# Patient Record
Sex: Male | Born: 1955
Health system: Southern US, Community
[De-identification: ages and names within clinical notes are randomized; demographics above are authoritative.]

## PROBLEM LIST (undated history)

## (undated) DIAGNOSIS — E7800 Pure hypercholesterolemia, unspecified: Secondary | ICD-10-CM

## (undated) DIAGNOSIS — C801 Malignant (primary) neoplasm, unspecified: Secondary | ICD-10-CM

## (undated) DIAGNOSIS — I509 Heart failure, unspecified: Secondary | ICD-10-CM

## (undated) DIAGNOSIS — O2230 Deep phlebothrombosis in pregnancy, unspecified trimester: Secondary | ICD-10-CM

## (undated) DIAGNOSIS — I2510 Atherosclerotic heart disease of native coronary artery without angina pectoris: Secondary | ICD-10-CM

## (undated) DIAGNOSIS — J189 Pneumonia, unspecified organism: Secondary | ICD-10-CM

## (undated) DIAGNOSIS — M1990 Unspecified osteoarthritis, unspecified site: Secondary | ICD-10-CM

## (undated) DIAGNOSIS — H269 Unspecified cataract: Secondary | ICD-10-CM

## (undated) DIAGNOSIS — I4891 Unspecified atrial fibrillation: Secondary | ICD-10-CM

## (undated) DIAGNOSIS — I82409 Acute embolism and thrombosis of unspecified deep veins of unspecified lower extremity: Secondary | ICD-10-CM

## (undated) DIAGNOSIS — F29 Unspecified psychosis not due to a substance or known physiological condition: Secondary | ICD-10-CM

## (undated) DIAGNOSIS — I10 Essential (primary) hypertension: Secondary | ICD-10-CM

## (undated) DIAGNOSIS — I2699 Other pulmonary embolism without acute cor pulmonale: Secondary | ICD-10-CM

## (undated) DIAGNOSIS — I499 Cardiac arrhythmia, unspecified: Secondary | ICD-10-CM

## (undated) DIAGNOSIS — G4733 Obstructive sleep apnea (adult) (pediatric): Principal | ICD-10-CM

## (undated) DIAGNOSIS — E669 Obesity, unspecified: Secondary | ICD-10-CM

## (undated) HISTORY — DX: Obstructive sleep apnea (adult) (pediatric): G47.33

## (undated) HISTORY — DX: Heart failure, unspecified: I50.9

## (undated) HISTORY — DX: Atherosclerotic heart disease of native coronary artery without angina pectoris: I25.10

## (undated) HISTORY — DX: Unspecified atrial fibrillation: I48.91

## (undated) HISTORY — PX: OTHER SURGICAL HISTORY: SHX169

## (undated) HISTORY — DX: Acute embolism and thrombosis of unspecified deep veins of unspecified lower extremity: I82.409

## (undated) HISTORY — PX: HERNIA REPAIR: SHX51

## (undated) HISTORY — DX: Other pulmonary embolism without acute cor pulmonale: I26.99

## (undated) HISTORY — DX: Obesity, unspecified: E66.9

## (undated) HISTORY — DX: Essential (primary) hypertension: I10

## (undated) HISTORY — DX: Pure hypercholesterolemia, unspecified: E78.00

## (undated) HISTORY — DX: Deep phlebothrombosis in pregnancy, unspecified trimester: O22.30

---

## 2011-04-27 DIAGNOSIS — M531 Cervicobrachial syndrome: Secondary | ICD-10-CM | POA: Diagnosis not present

## 2011-04-27 DIAGNOSIS — M9981 Other biomechanical lesions of cervical region: Secondary | ICD-10-CM | POA: Diagnosis not present

## 2011-04-28 DIAGNOSIS — D689 Coagulation defect, unspecified: Secondary | ICD-10-CM | POA: Diagnosis not present

## 2011-05-01 DIAGNOSIS — R978 Other abnormal tumor markers: Secondary | ICD-10-CM | POA: Diagnosis not present

## 2011-05-01 DIAGNOSIS — E869 Volume depletion, unspecified: Secondary | ICD-10-CM | POA: Diagnosis not present

## 2011-05-01 DIAGNOSIS — D45 Polycythemia vera: Secondary | ICD-10-CM | POA: Diagnosis not present

## 2011-05-01 DIAGNOSIS — C6290 Malignant neoplasm of unspecified testis, unspecified whether descended or undescended: Secondary | ICD-10-CM | POA: Diagnosis not present

## 2011-05-01 DIAGNOSIS — Z8547 Personal history of malignant neoplasm of testis: Secondary | ICD-10-CM | POA: Diagnosis not present

## 2011-05-02 DIAGNOSIS — R7301 Impaired fasting glucose: Secondary | ICD-10-CM | POA: Diagnosis not present

## 2011-05-02 DIAGNOSIS — M545 Low back pain, unspecified: Secondary | ICD-10-CM | POA: Diagnosis not present

## 2011-05-02 DIAGNOSIS — E782 Mixed hyperlipidemia: Secondary | ICD-10-CM | POA: Diagnosis not present

## 2011-05-02 DIAGNOSIS — E538 Deficiency of other specified B group vitamins: Secondary | ICD-10-CM | POA: Diagnosis not present

## 2011-05-02 DIAGNOSIS — M999 Biomechanical lesion, unspecified: Secondary | ICD-10-CM | POA: Diagnosis not present

## 2011-05-02 DIAGNOSIS — E291 Testicular hypofunction: Secondary | ICD-10-CM | POA: Diagnosis not present

## 2011-05-02 DIAGNOSIS — E785 Hyperlipidemia, unspecified: Secondary | ICD-10-CM | POA: Diagnosis not present

## 2011-05-02 DIAGNOSIS — Z125 Encounter for screening for malignant neoplasm of prostate: Secondary | ICD-10-CM | POA: Diagnosis not present

## 2011-05-02 DIAGNOSIS — I10 Essential (primary) hypertension: Secondary | ICD-10-CM | POA: Diagnosis not present

## 2011-05-02 DIAGNOSIS — E669 Obesity, unspecified: Secondary | ICD-10-CM | POA: Diagnosis not present

## 2011-05-02 DIAGNOSIS — E559 Vitamin D deficiency, unspecified: Secondary | ICD-10-CM | POA: Diagnosis not present

## 2011-05-11 DIAGNOSIS — E291 Testicular hypofunction: Secondary | ICD-10-CM | POA: Diagnosis not present

## 2011-05-11 DIAGNOSIS — R7301 Impaired fasting glucose: Secondary | ICD-10-CM | POA: Diagnosis not present

## 2011-05-11 DIAGNOSIS — E559 Vitamin D deficiency, unspecified: Secondary | ICD-10-CM | POA: Diagnosis not present

## 2011-05-11 DIAGNOSIS — M999 Biomechanical lesion, unspecified: Secondary | ICD-10-CM | POA: Diagnosis not present

## 2011-05-11 DIAGNOSIS — M545 Low back pain, unspecified: Secondary | ICD-10-CM | POA: Diagnosis not present

## 2011-05-11 DIAGNOSIS — E669 Obesity, unspecified: Secondary | ICD-10-CM | POA: Diagnosis not present

## 2011-05-11 DIAGNOSIS — E538 Deficiency of other specified B group vitamins: Secondary | ICD-10-CM | POA: Diagnosis not present

## 2011-05-11 DIAGNOSIS — E785 Hyperlipidemia, unspecified: Secondary | ICD-10-CM | POA: Diagnosis not present

## 2011-05-11 DIAGNOSIS — I10 Essential (primary) hypertension: Secondary | ICD-10-CM | POA: Diagnosis not present

## 2011-05-12 DIAGNOSIS — D689 Coagulation defect, unspecified: Secondary | ICD-10-CM | POA: Diagnosis not present

## 2011-05-15 DIAGNOSIS — I2699 Other pulmonary embolism without acute cor pulmonale: Secondary | ICD-10-CM | POA: Diagnosis not present

## 2011-05-15 DIAGNOSIS — E785 Hyperlipidemia, unspecified: Secondary | ICD-10-CM | POA: Diagnosis not present

## 2011-05-15 DIAGNOSIS — I2510 Atherosclerotic heart disease of native coronary artery without angina pectoris: Secondary | ICD-10-CM | POA: Diagnosis not present

## 2011-05-15 DIAGNOSIS — Z9861 Coronary angioplasty status: Secondary | ICD-10-CM | POA: Diagnosis not present

## 2011-05-21 DIAGNOSIS — M999 Biomechanical lesion, unspecified: Secondary | ICD-10-CM | POA: Diagnosis not present

## 2011-05-21 DIAGNOSIS — M519 Unspecified thoracic, thoracolumbar and lumbosacral intervertebral disc disorder: Secondary | ICD-10-CM | POA: Diagnosis not present

## 2011-05-21 DIAGNOSIS — M545 Low back pain, unspecified: Secondary | ICD-10-CM | POA: Diagnosis not present

## 2011-05-21 DIAGNOSIS — IMO0002 Reserved for concepts with insufficient information to code with codable children: Secondary | ICD-10-CM | POA: Diagnosis not present

## 2011-05-21 DIAGNOSIS — M48061 Spinal stenosis, lumbar region without neurogenic claudication: Secondary | ICD-10-CM | POA: Diagnosis not present

## 2011-05-26 DIAGNOSIS — D689 Coagulation defect, unspecified: Secondary | ICD-10-CM | POA: Diagnosis not present

## 2011-05-28 DIAGNOSIS — M999 Biomechanical lesion, unspecified: Secondary | ICD-10-CM | POA: Diagnosis not present

## 2011-05-28 DIAGNOSIS — M545 Low back pain, unspecified: Secondary | ICD-10-CM | POA: Diagnosis not present

## 2011-05-31 DIAGNOSIS — E669 Obesity, unspecified: Secondary | ICD-10-CM | POA: Diagnosis not present

## 2011-05-31 DIAGNOSIS — E291 Testicular hypofunction: Secondary | ICD-10-CM | POA: Diagnosis not present

## 2011-05-31 DIAGNOSIS — I10 Essential (primary) hypertension: Secondary | ICD-10-CM | POA: Diagnosis not present

## 2011-05-31 DIAGNOSIS — E785 Hyperlipidemia, unspecified: Secondary | ICD-10-CM | POA: Diagnosis not present

## 2011-05-31 DIAGNOSIS — E538 Deficiency of other specified B group vitamins: Secondary | ICD-10-CM | POA: Diagnosis not present

## 2011-05-31 DIAGNOSIS — E559 Vitamin D deficiency, unspecified: Secondary | ICD-10-CM | POA: Diagnosis not present

## 2011-05-31 DIAGNOSIS — R7301 Impaired fasting glucose: Secondary | ICD-10-CM | POA: Diagnosis not present

## 2011-06-08 DIAGNOSIS — D689 Coagulation defect, unspecified: Secondary | ICD-10-CM | POA: Diagnosis not present

## 2011-06-18 DIAGNOSIS — IMO0002 Reserved for concepts with insufficient information to code with codable children: Secondary | ICD-10-CM | POA: Diagnosis not present

## 2011-06-18 DIAGNOSIS — M519 Unspecified thoracic, thoracolumbar and lumbosacral intervertebral disc disorder: Secondary | ICD-10-CM | POA: Diagnosis not present

## 2011-06-18 DIAGNOSIS — M48061 Spinal stenosis, lumbar region without neurogenic claudication: Secondary | ICD-10-CM | POA: Diagnosis not present

## 2011-06-22 DIAGNOSIS — D689 Coagulation defect, unspecified: Secondary | ICD-10-CM | POA: Diagnosis not present

## 2011-07-06 DIAGNOSIS — D689 Coagulation defect, unspecified: Secondary | ICD-10-CM | POA: Diagnosis not present

## 2011-07-09 DIAGNOSIS — M545 Low back pain, unspecified: Secondary | ICD-10-CM | POA: Diagnosis not present

## 2011-07-09 DIAGNOSIS — M999 Biomechanical lesion, unspecified: Secondary | ICD-10-CM | POA: Diagnosis not present

## 2011-07-13 DIAGNOSIS — M999 Biomechanical lesion, unspecified: Secondary | ICD-10-CM | POA: Diagnosis not present

## 2011-07-13 DIAGNOSIS — M545 Low back pain, unspecified: Secondary | ICD-10-CM | POA: Diagnosis not present

## 2011-07-13 DIAGNOSIS — Z5189 Encounter for other specified aftercare: Secondary | ICD-10-CM | POA: Diagnosis not present

## 2011-07-16 DIAGNOSIS — M519 Unspecified thoracic, thoracolumbar and lumbosacral intervertebral disc disorder: Secondary | ICD-10-CM | POA: Diagnosis not present

## 2011-07-16 DIAGNOSIS — IMO0002 Reserved for concepts with insufficient information to code with codable children: Secondary | ICD-10-CM | POA: Diagnosis not present

## 2011-07-16 DIAGNOSIS — M48061 Spinal stenosis, lumbar region without neurogenic claudication: Secondary | ICD-10-CM | POA: Diagnosis not present

## 2011-07-16 DIAGNOSIS — M5380 Other specified dorsopathies, site unspecified: Secondary | ICD-10-CM | POA: Diagnosis not present

## 2011-07-20 DIAGNOSIS — M545 Low back pain, unspecified: Secondary | ICD-10-CM | POA: Diagnosis not present

## 2011-07-20 DIAGNOSIS — M999 Biomechanical lesion, unspecified: Secondary | ICD-10-CM | POA: Diagnosis not present

## 2011-07-20 DIAGNOSIS — Z5189 Encounter for other specified aftercare: Secondary | ICD-10-CM | POA: Diagnosis not present

## 2011-07-21 DIAGNOSIS — D689 Coagulation defect, unspecified: Secondary | ICD-10-CM | POA: Diagnosis not present

## 2011-07-25 DIAGNOSIS — E785 Hyperlipidemia, unspecified: Secondary | ICD-10-CM | POA: Diagnosis not present

## 2011-07-25 DIAGNOSIS — E559 Vitamin D deficiency, unspecified: Secondary | ICD-10-CM | POA: Diagnosis not present

## 2011-07-25 DIAGNOSIS — E782 Mixed hyperlipidemia: Secondary | ICD-10-CM | POA: Diagnosis not present

## 2011-07-25 DIAGNOSIS — I10 Essential (primary) hypertension: Secondary | ICD-10-CM | POA: Diagnosis not present

## 2011-07-25 DIAGNOSIS — E669 Obesity, unspecified: Secondary | ICD-10-CM | POA: Diagnosis not present

## 2011-07-25 DIAGNOSIS — R7301 Impaired fasting glucose: Secondary | ICD-10-CM | POA: Diagnosis not present

## 2011-07-25 DIAGNOSIS — E291 Testicular hypofunction: Secondary | ICD-10-CM | POA: Diagnosis not present

## 2011-07-25 DIAGNOSIS — E538 Deficiency of other specified B group vitamins: Secondary | ICD-10-CM | POA: Diagnosis not present

## 2011-07-27 DIAGNOSIS — M999 Biomechanical lesion, unspecified: Secondary | ICD-10-CM | POA: Diagnosis not present

## 2011-07-27 DIAGNOSIS — Z5189 Encounter for other specified aftercare: Secondary | ICD-10-CM | POA: Diagnosis not present

## 2011-07-27 DIAGNOSIS — M545 Low back pain, unspecified: Secondary | ICD-10-CM | POA: Diagnosis not present

## 2011-07-30 DIAGNOSIS — M545 Low back pain, unspecified: Secondary | ICD-10-CM | POA: Diagnosis not present

## 2011-07-30 DIAGNOSIS — Z5189 Encounter for other specified aftercare: Secondary | ICD-10-CM | POA: Diagnosis not present

## 2011-07-30 DIAGNOSIS — M999 Biomechanical lesion, unspecified: Secondary | ICD-10-CM | POA: Diagnosis not present

## 2011-07-30 DIAGNOSIS — I8010 Phlebitis and thrombophlebitis of unspecified femoral vein: Secondary | ICD-10-CM | POA: Diagnosis not present

## 2011-07-30 DIAGNOSIS — I8310 Varicose veins of unspecified lower extremity with inflammation: Secondary | ICD-10-CM | POA: Diagnosis not present

## 2011-07-30 DIAGNOSIS — I80299 Phlebitis and thrombophlebitis of other deep vessels of unspecified lower extremity: Secondary | ICD-10-CM | POA: Diagnosis not present

## 2011-08-01 DIAGNOSIS — M999 Biomechanical lesion, unspecified: Secondary | ICD-10-CM | POA: Diagnosis not present

## 2011-08-01 DIAGNOSIS — Z5189 Encounter for other specified aftercare: Secondary | ICD-10-CM | POA: Diagnosis not present

## 2011-08-01 DIAGNOSIS — M545 Low back pain, unspecified: Secondary | ICD-10-CM | POA: Diagnosis not present

## 2011-08-03 DIAGNOSIS — M545 Low back pain, unspecified: Secondary | ICD-10-CM | POA: Diagnosis not present

## 2011-08-03 DIAGNOSIS — E785 Hyperlipidemia, unspecified: Secondary | ICD-10-CM | POA: Diagnosis not present

## 2011-08-03 DIAGNOSIS — I10 Essential (primary) hypertension: Secondary | ICD-10-CM | POA: Diagnosis not present

## 2011-08-03 DIAGNOSIS — E291 Testicular hypofunction: Secondary | ICD-10-CM | POA: Diagnosis not present

## 2011-08-03 DIAGNOSIS — E538 Deficiency of other specified B group vitamins: Secondary | ICD-10-CM | POA: Diagnosis not present

## 2011-08-03 DIAGNOSIS — E559 Vitamin D deficiency, unspecified: Secondary | ICD-10-CM | POA: Diagnosis not present

## 2011-08-03 DIAGNOSIS — D689 Coagulation defect, unspecified: Secondary | ICD-10-CM | POA: Diagnosis not present

## 2011-08-03 DIAGNOSIS — R7301 Impaired fasting glucose: Secondary | ICD-10-CM | POA: Diagnosis not present

## 2011-08-03 DIAGNOSIS — Z5189 Encounter for other specified aftercare: Secondary | ICD-10-CM | POA: Diagnosis not present

## 2011-08-03 DIAGNOSIS — E669 Obesity, unspecified: Secondary | ICD-10-CM | POA: Diagnosis not present

## 2011-08-03 DIAGNOSIS — M999 Biomechanical lesion, unspecified: Secondary | ICD-10-CM | POA: Diagnosis not present

## 2011-08-09 DIAGNOSIS — Z7901 Long term (current) use of anticoagulants: Secondary | ICD-10-CM | POA: Diagnosis not present

## 2011-08-09 DIAGNOSIS — I252 Old myocardial infarction: Secondary | ICD-10-CM | POA: Diagnosis not present

## 2011-08-09 DIAGNOSIS — IMO0002 Reserved for concepts with insufficient information to code with codable children: Secondary | ICD-10-CM | POA: Diagnosis not present

## 2011-08-09 DIAGNOSIS — Z86711 Personal history of pulmonary embolism: Secondary | ICD-10-CM | POA: Diagnosis not present

## 2011-08-09 DIAGNOSIS — Z86718 Personal history of other venous thrombosis and embolism: Secondary | ICD-10-CM | POA: Diagnosis not present

## 2011-08-13 DIAGNOSIS — M519 Unspecified thoracic, thoracolumbar and lumbosacral intervertebral disc disorder: Secondary | ICD-10-CM | POA: Diagnosis not present

## 2011-08-13 DIAGNOSIS — M48061 Spinal stenosis, lumbar region without neurogenic claudication: Secondary | ICD-10-CM | POA: Diagnosis not present

## 2011-08-13 DIAGNOSIS — IMO0002 Reserved for concepts with insufficient information to code with codable children: Secondary | ICD-10-CM | POA: Diagnosis not present

## 2011-08-14 DIAGNOSIS — IMO0002 Reserved for concepts with insufficient information to code with codable children: Secondary | ICD-10-CM | POA: Diagnosis not present

## 2011-08-14 DIAGNOSIS — M25569 Pain in unspecified knee: Secondary | ICD-10-CM | POA: Diagnosis not present

## 2011-08-15 DIAGNOSIS — M545 Low back pain, unspecified: Secondary | ICD-10-CM | POA: Diagnosis not present

## 2011-08-15 DIAGNOSIS — M999 Biomechanical lesion, unspecified: Secondary | ICD-10-CM | POA: Diagnosis not present

## 2011-08-15 DIAGNOSIS — Z5189 Encounter for other specified aftercare: Secondary | ICD-10-CM | POA: Diagnosis not present

## 2011-08-17 DIAGNOSIS — D689 Coagulation defect, unspecified: Secondary | ICD-10-CM | POA: Diagnosis not present

## 2011-08-21 DIAGNOSIS — M25569 Pain in unspecified knee: Secondary | ICD-10-CM | POA: Diagnosis not present

## 2011-08-21 DIAGNOSIS — IMO0002 Reserved for concepts with insufficient information to code with codable children: Secondary | ICD-10-CM | POA: Diagnosis not present

## 2011-08-21 DIAGNOSIS — M1710 Unilateral primary osteoarthritis, unspecified knee: Secondary | ICD-10-CM | POA: Diagnosis not present

## 2011-08-30 DIAGNOSIS — D689 Coagulation defect, unspecified: Secondary | ICD-10-CM | POA: Diagnosis not present

## 2011-08-31 DIAGNOSIS — I8310 Varicose veins of unspecified lower extremity with inflammation: Secondary | ICD-10-CM | POA: Diagnosis not present

## 2011-09-03 DIAGNOSIS — M545 Low back pain, unspecified: Secondary | ICD-10-CM | POA: Diagnosis not present

## 2011-09-03 DIAGNOSIS — I8010 Phlebitis and thrombophlebitis of unspecified femoral vein: Secondary | ICD-10-CM | POA: Diagnosis not present

## 2011-09-03 DIAGNOSIS — M999 Biomechanical lesion, unspecified: Secondary | ICD-10-CM | POA: Diagnosis not present

## 2011-09-03 DIAGNOSIS — I872 Venous insufficiency (chronic) (peripheral): Secondary | ICD-10-CM | POA: Diagnosis not present

## 2011-09-03 DIAGNOSIS — Z5189 Encounter for other specified aftercare: Secondary | ICD-10-CM | POA: Diagnosis not present

## 2011-09-03 DIAGNOSIS — I8310 Varicose veins of unspecified lower extremity with inflammation: Secondary | ICD-10-CM | POA: Diagnosis not present

## 2011-09-07 DIAGNOSIS — E869 Volume depletion, unspecified: Secondary | ICD-10-CM | POA: Diagnosis not present

## 2011-09-07 DIAGNOSIS — C6290 Malignant neoplasm of unspecified testis, unspecified whether descended or undescended: Secondary | ICD-10-CM | POA: Diagnosis not present

## 2011-09-10 DIAGNOSIS — IMO0002 Reserved for concepts with insufficient information to code with codable children: Secondary | ICD-10-CM | POA: Diagnosis not present

## 2011-09-10 DIAGNOSIS — M48061 Spinal stenosis, lumbar region without neurogenic claudication: Secondary | ICD-10-CM | POA: Diagnosis not present

## 2011-09-14 DIAGNOSIS — M999 Biomechanical lesion, unspecified: Secondary | ICD-10-CM | POA: Diagnosis not present

## 2011-09-14 DIAGNOSIS — M545 Low back pain, unspecified: Secondary | ICD-10-CM | POA: Diagnosis not present

## 2011-09-14 DIAGNOSIS — Z5189 Encounter for other specified aftercare: Secondary | ICD-10-CM | POA: Diagnosis not present

## 2011-09-19 DIAGNOSIS — Z5189 Encounter for other specified aftercare: Secondary | ICD-10-CM | POA: Diagnosis not present

## 2011-09-19 DIAGNOSIS — M545 Low back pain, unspecified: Secondary | ICD-10-CM | POA: Diagnosis not present

## 2011-09-19 DIAGNOSIS — M999 Biomechanical lesion, unspecified: Secondary | ICD-10-CM | POA: Diagnosis not present

## 2011-09-26 DIAGNOSIS — M999 Biomechanical lesion, unspecified: Secondary | ICD-10-CM | POA: Diagnosis not present

## 2011-09-26 DIAGNOSIS — M545 Low back pain, unspecified: Secondary | ICD-10-CM | POA: Diagnosis not present

## 2011-09-26 DIAGNOSIS — Z5189 Encounter for other specified aftercare: Secondary | ICD-10-CM | POA: Diagnosis not present

## 2011-10-03 DIAGNOSIS — Z5189 Encounter for other specified aftercare: Secondary | ICD-10-CM | POA: Diagnosis not present

## 2011-10-03 DIAGNOSIS — M999 Biomechanical lesion, unspecified: Secondary | ICD-10-CM | POA: Diagnosis not present

## 2011-10-03 DIAGNOSIS — M545 Low back pain, unspecified: Secondary | ICD-10-CM | POA: Diagnosis not present

## 2011-10-04 DIAGNOSIS — M545 Low back pain, unspecified: Secondary | ICD-10-CM | POA: Diagnosis not present

## 2011-10-04 DIAGNOSIS — IMO0002 Reserved for concepts with insufficient information to code with codable children: Secondary | ICD-10-CM | POA: Diagnosis not present

## 2011-10-08 DIAGNOSIS — E7800 Pure hypercholesterolemia, unspecified: Secondary | ICD-10-CM | POA: Diagnosis not present

## 2011-10-08 DIAGNOSIS — Z5181 Encounter for therapeutic drug level monitoring: Secondary | ICD-10-CM | POA: Diagnosis not present

## 2011-10-08 DIAGNOSIS — IMO0002 Reserved for concepts with insufficient information to code with codable children: Secondary | ICD-10-CM | POA: Diagnosis not present

## 2011-10-08 DIAGNOSIS — M48061 Spinal stenosis, lumbar region without neurogenic claudication: Secondary | ICD-10-CM | POA: Diagnosis not present

## 2011-10-08 DIAGNOSIS — I2510 Atherosclerotic heart disease of native coronary artery without angina pectoris: Secondary | ICD-10-CM | POA: Diagnosis not present

## 2011-10-22 DIAGNOSIS — Z5189 Encounter for other specified aftercare: Secondary | ICD-10-CM | POA: Diagnosis not present

## 2011-10-22 DIAGNOSIS — M545 Low back pain, unspecified: Secondary | ICD-10-CM | POA: Diagnosis not present

## 2011-10-22 DIAGNOSIS — M999 Biomechanical lesion, unspecified: Secondary | ICD-10-CM | POA: Diagnosis not present

## 2011-10-31 DIAGNOSIS — R7301 Impaired fasting glucose: Secondary | ICD-10-CM | POA: Diagnosis not present

## 2011-10-31 DIAGNOSIS — E538 Deficiency of other specified B group vitamins: Secondary | ICD-10-CM | POA: Diagnosis not present

## 2011-10-31 DIAGNOSIS — E559 Vitamin D deficiency, unspecified: Secondary | ICD-10-CM | POA: Diagnosis not present

## 2011-10-31 DIAGNOSIS — R5383 Other fatigue: Secondary | ICD-10-CM | POA: Diagnosis not present

## 2011-10-31 DIAGNOSIS — E291 Testicular hypofunction: Secondary | ICD-10-CM | POA: Diagnosis not present

## 2011-10-31 DIAGNOSIS — M62838 Other muscle spasm: Secondary | ICD-10-CM | POA: Diagnosis not present

## 2011-10-31 DIAGNOSIS — E782 Mixed hyperlipidemia: Secondary | ICD-10-CM | POA: Diagnosis not present

## 2011-10-31 DIAGNOSIS — R5381 Other malaise: Secondary | ICD-10-CM | POA: Diagnosis not present

## 2011-10-31 DIAGNOSIS — M519 Unspecified thoracic, thoracolumbar and lumbosacral intervertebral disc disorder: Secondary | ICD-10-CM | POA: Diagnosis not present

## 2011-10-31 DIAGNOSIS — E669 Obesity, unspecified: Secondary | ICD-10-CM | POA: Diagnosis not present

## 2011-10-31 DIAGNOSIS — IMO0002 Reserved for concepts with insufficient information to code with codable children: Secondary | ICD-10-CM | POA: Diagnosis not present

## 2011-10-31 DIAGNOSIS — E785 Hyperlipidemia, unspecified: Secondary | ICD-10-CM | POA: Diagnosis not present

## 2011-10-31 DIAGNOSIS — I10 Essential (primary) hypertension: Secondary | ICD-10-CM | POA: Diagnosis not present

## 2011-11-01 DIAGNOSIS — E7800 Pure hypercholesterolemia, unspecified: Secondary | ICD-10-CM | POA: Diagnosis not present

## 2011-11-01 DIAGNOSIS — Z5181 Encounter for therapeutic drug level monitoring: Secondary | ICD-10-CM | POA: Diagnosis not present

## 2011-11-01 DIAGNOSIS — IMO0002 Reserved for concepts with insufficient information to code with codable children: Secondary | ICD-10-CM | POA: Diagnosis not present

## 2011-11-01 DIAGNOSIS — M48061 Spinal stenosis, lumbar region without neurogenic claudication: Secondary | ICD-10-CM | POA: Diagnosis not present

## 2011-11-01 DIAGNOSIS — I10 Essential (primary) hypertension: Secondary | ICD-10-CM | POA: Diagnosis not present

## 2011-11-01 DIAGNOSIS — I2510 Atherosclerotic heart disease of native coronary artery without angina pectoris: Secondary | ICD-10-CM | POA: Diagnosis not present

## 2011-11-09 DIAGNOSIS — I10 Essential (primary) hypertension: Secondary | ICD-10-CM | POA: Diagnosis not present

## 2011-11-09 DIAGNOSIS — E669 Obesity, unspecified: Secondary | ICD-10-CM | POA: Diagnosis not present

## 2011-11-09 DIAGNOSIS — M545 Low back pain, unspecified: Secondary | ICD-10-CM | POA: Diagnosis not present

## 2011-11-09 DIAGNOSIS — M999 Biomechanical lesion, unspecified: Secondary | ICD-10-CM | POA: Diagnosis not present

## 2011-11-09 DIAGNOSIS — E538 Deficiency of other specified B group vitamins: Secondary | ICD-10-CM | POA: Diagnosis not present

## 2011-11-09 DIAGNOSIS — R7301 Impaired fasting glucose: Secondary | ICD-10-CM | POA: Diagnosis not present

## 2011-11-09 DIAGNOSIS — E291 Testicular hypofunction: Secondary | ICD-10-CM | POA: Diagnosis not present

## 2011-11-09 DIAGNOSIS — E785 Hyperlipidemia, unspecified: Secondary | ICD-10-CM | POA: Diagnosis not present

## 2011-11-09 DIAGNOSIS — Z5189 Encounter for other specified aftercare: Secondary | ICD-10-CM | POA: Diagnosis not present

## 2011-11-09 DIAGNOSIS — E559 Vitamin D deficiency, unspecified: Secondary | ICD-10-CM | POA: Diagnosis not present

## 2011-11-13 DIAGNOSIS — IMO0002 Reserved for concepts with insufficient information to code with codable children: Secondary | ICD-10-CM | POA: Diagnosis not present

## 2011-11-13 DIAGNOSIS — M1710 Unilateral primary osteoarthritis, unspecified knee: Secondary | ICD-10-CM | POA: Diagnosis not present

## 2011-11-13 DIAGNOSIS — M25569 Pain in unspecified knee: Secondary | ICD-10-CM | POA: Diagnosis not present

## 2011-11-14 DIAGNOSIS — M48061 Spinal stenosis, lumbar region without neurogenic claudication: Secondary | ICD-10-CM | POA: Diagnosis not present

## 2011-11-14 DIAGNOSIS — IMO0002 Reserved for concepts with insufficient information to code with codable children: Secondary | ICD-10-CM | POA: Diagnosis not present

## 2011-11-19 DIAGNOSIS — M5430 Sciatica, unspecified side: Secondary | ICD-10-CM | POA: Diagnosis not present

## 2011-11-19 DIAGNOSIS — M999 Biomechanical lesion, unspecified: Secondary | ICD-10-CM | POA: Diagnosis not present

## 2011-11-19 DIAGNOSIS — M25569 Pain in unspecified knee: Secondary | ICD-10-CM | POA: Diagnosis not present

## 2011-11-19 DIAGNOSIS — M1710 Unilateral primary osteoarthritis, unspecified knee: Secondary | ICD-10-CM | POA: Diagnosis not present

## 2011-11-19 DIAGNOSIS — IMO0002 Reserved for concepts with insufficient information to code with codable children: Secondary | ICD-10-CM | POA: Diagnosis not present

## 2011-11-21 DIAGNOSIS — M999 Biomechanical lesion, unspecified: Secondary | ICD-10-CM | POA: Diagnosis not present

## 2011-11-21 DIAGNOSIS — M5430 Sciatica, unspecified side: Secondary | ICD-10-CM | POA: Diagnosis not present

## 2011-11-26 DIAGNOSIS — M545 Low back pain, unspecified: Secondary | ICD-10-CM | POA: Diagnosis not present

## 2011-11-26 DIAGNOSIS — M999 Biomechanical lesion, unspecified: Secondary | ICD-10-CM | POA: Diagnosis not present

## 2011-11-26 DIAGNOSIS — IMO0002 Reserved for concepts with insufficient information to code with codable children: Secondary | ICD-10-CM | POA: Diagnosis not present

## 2011-11-26 DIAGNOSIS — M5430 Sciatica, unspecified side: Secondary | ICD-10-CM | POA: Diagnosis not present

## 2011-11-28 DIAGNOSIS — IMO0002 Reserved for concepts with insufficient information to code with codable children: Secondary | ICD-10-CM | POA: Diagnosis not present

## 2011-11-28 DIAGNOSIS — M546 Pain in thoracic spine: Secondary | ICD-10-CM | POA: Diagnosis not present

## 2011-11-30 DIAGNOSIS — M546 Pain in thoracic spine: Secondary | ICD-10-CM | POA: Diagnosis not present

## 2011-11-30 DIAGNOSIS — IMO0002 Reserved for concepts with insufficient information to code with codable children: Secondary | ICD-10-CM | POA: Diagnosis not present

## 2011-12-04 DIAGNOSIS — M546 Pain in thoracic spine: Secondary | ICD-10-CM | POA: Diagnosis not present

## 2011-12-04 DIAGNOSIS — IMO0002 Reserved for concepts with insufficient information to code with codable children: Secondary | ICD-10-CM | POA: Diagnosis not present

## 2011-12-07 DIAGNOSIS — IMO0002 Reserved for concepts with insufficient information to code with codable children: Secondary | ICD-10-CM | POA: Diagnosis not present

## 2011-12-07 DIAGNOSIS — M546 Pain in thoracic spine: Secondary | ICD-10-CM | POA: Diagnosis not present

## 2011-12-11 DIAGNOSIS — IMO0002 Reserved for concepts with insufficient information to code with codable children: Secondary | ICD-10-CM | POA: Diagnosis not present

## 2011-12-11 DIAGNOSIS — M1710 Unilateral primary osteoarthritis, unspecified knee: Secondary | ICD-10-CM | POA: Diagnosis not present

## 2011-12-11 DIAGNOSIS — M25569 Pain in unspecified knee: Secondary | ICD-10-CM | POA: Diagnosis not present

## 2012-01-03 DIAGNOSIS — L905 Scar conditions and fibrosis of skin: Secondary | ICD-10-CM | POA: Diagnosis not present

## 2012-01-03 DIAGNOSIS — M542 Cervicalgia: Secondary | ICD-10-CM | POA: Diagnosis not present

## 2012-01-03 DIAGNOSIS — M1990 Unspecified osteoarthritis, unspecified site: Secondary | ICD-10-CM | POA: Diagnosis not present

## 2012-01-03 DIAGNOSIS — M549 Dorsalgia, unspecified: Secondary | ICD-10-CM | POA: Diagnosis not present

## 2012-01-03 DIAGNOSIS — IMO0002 Reserved for concepts with insufficient information to code with codable children: Secondary | ICD-10-CM | POA: Diagnosis not present

## 2012-01-03 DIAGNOSIS — M519 Unspecified thoracic, thoracolumbar and lumbosacral intervertebral disc disorder: Secondary | ICD-10-CM | POA: Diagnosis not present

## 2012-01-03 DIAGNOSIS — Z5111 Encounter for antineoplastic chemotherapy: Secondary | ICD-10-CM | POA: Diagnosis not present

## 2012-01-03 DIAGNOSIS — I8010 Phlebitis and thrombophlebitis of unspecified femoral vein: Secondary | ICD-10-CM | POA: Diagnosis not present

## 2012-01-03 DIAGNOSIS — I219 Acute myocardial infarction, unspecified: Secondary | ICD-10-CM | POA: Diagnosis not present

## 2012-01-03 DIAGNOSIS — I10 Essential (primary) hypertension: Secondary | ICD-10-CM | POA: Diagnosis not present

## 2012-01-03 DIAGNOSIS — Z01818 Encounter for other preprocedural examination: Secondary | ICD-10-CM | POA: Diagnosis not present

## 2012-01-03 DIAGNOSIS — M48061 Spinal stenosis, lumbar region without neurogenic claudication: Secondary | ICD-10-CM | POA: Diagnosis not present

## 2012-01-03 DIAGNOSIS — K440 Diaphragmatic hernia with obstruction, without gangrene: Secondary | ICD-10-CM | POA: Diagnosis not present

## 2012-01-03 DIAGNOSIS — E299 Testicular dysfunction, unspecified: Secondary | ICD-10-CM | POA: Diagnosis not present

## 2012-01-03 DIAGNOSIS — Z9889 Other specified postprocedural states: Secondary | ICD-10-CM | POA: Diagnosis not present

## 2012-01-04 DIAGNOSIS — M9981 Other biomechanical lesions of cervical region: Secondary | ICD-10-CM | POA: Diagnosis not present

## 2012-01-04 DIAGNOSIS — M5412 Radiculopathy, cervical region: Secondary | ICD-10-CM | POA: Diagnosis not present

## 2012-01-11 DIAGNOSIS — M5412 Radiculopathy, cervical region: Secondary | ICD-10-CM | POA: Diagnosis not present

## 2012-01-11 DIAGNOSIS — M9981 Other biomechanical lesions of cervical region: Secondary | ICD-10-CM | POA: Diagnosis not present

## 2012-01-23 DIAGNOSIS — I10 Essential (primary) hypertension: Secondary | ICD-10-CM | POA: Diagnosis not present

## 2012-01-23 DIAGNOSIS — E669 Obesity, unspecified: Secondary | ICD-10-CM | POA: Diagnosis not present

## 2012-01-23 DIAGNOSIS — E291 Testicular hypofunction: Secondary | ICD-10-CM | POA: Diagnosis not present

## 2012-01-23 DIAGNOSIS — E559 Vitamin D deficiency, unspecified: Secondary | ICD-10-CM | POA: Diagnosis not present

## 2012-01-23 DIAGNOSIS — E538 Deficiency of other specified B group vitamins: Secondary | ICD-10-CM | POA: Diagnosis not present

## 2012-01-23 DIAGNOSIS — R7301 Impaired fasting glucose: Secondary | ICD-10-CM | POA: Diagnosis not present

## 2012-01-23 DIAGNOSIS — E785 Hyperlipidemia, unspecified: Secondary | ICD-10-CM | POA: Diagnosis not present

## 2012-01-23 DIAGNOSIS — Z125 Encounter for screening for malignant neoplasm of prostate: Secondary | ICD-10-CM | POA: Diagnosis not present

## 2012-01-31 DIAGNOSIS — IMO0002 Reserved for concepts with insufficient information to code with codable children: Secondary | ICD-10-CM | POA: Diagnosis not present

## 2012-01-31 DIAGNOSIS — M48061 Spinal stenosis, lumbar region without neurogenic claudication: Secondary | ICD-10-CM | POA: Diagnosis not present

## 2012-02-01 DIAGNOSIS — I10 Essential (primary) hypertension: Secondary | ICD-10-CM | POA: Diagnosis not present

## 2012-02-01 DIAGNOSIS — E291 Testicular hypofunction: Secondary | ICD-10-CM | POA: Diagnosis not present

## 2012-02-01 DIAGNOSIS — R7301 Impaired fasting glucose: Secondary | ICD-10-CM | POA: Diagnosis not present

## 2012-02-01 DIAGNOSIS — E559 Vitamin D deficiency, unspecified: Secondary | ICD-10-CM | POA: Diagnosis not present

## 2012-02-01 DIAGNOSIS — E538 Deficiency of other specified B group vitamins: Secondary | ICD-10-CM | POA: Diagnosis not present

## 2012-02-01 DIAGNOSIS — E785 Hyperlipidemia, unspecified: Secondary | ICD-10-CM | POA: Diagnosis not present

## 2012-02-01 DIAGNOSIS — E669 Obesity, unspecified: Secondary | ICD-10-CM | POA: Diagnosis not present

## 2012-02-16 DIAGNOSIS — K439 Ventral hernia without obstruction or gangrene: Secondary | ICD-10-CM | POA: Diagnosis present

## 2012-02-16 DIAGNOSIS — I517 Cardiomegaly: Secondary | ICD-10-CM | POA: Diagnosis not present

## 2012-02-16 DIAGNOSIS — Z9861 Coronary angioplasty status: Secondary | ICD-10-CM | POA: Diagnosis not present

## 2012-02-16 DIAGNOSIS — Z87891 Personal history of nicotine dependence: Secondary | ICD-10-CM | POA: Diagnosis not present

## 2012-02-16 DIAGNOSIS — I2699 Other pulmonary embolism without acute cor pulmonale: Secondary | ICD-10-CM | POA: Diagnosis not present

## 2012-02-16 DIAGNOSIS — Z8547 Personal history of malignant neoplasm of testis: Secondary | ICD-10-CM | POA: Diagnosis not present

## 2012-02-16 DIAGNOSIS — R071 Chest pain on breathing: Secondary | ICD-10-CM | POA: Diagnosis present

## 2012-02-16 DIAGNOSIS — J811 Chronic pulmonary edema: Secondary | ICD-10-CM | POA: Diagnosis not present

## 2012-02-16 DIAGNOSIS — I825Y9 Chronic embolism and thrombosis of unspecified deep veins of unspecified proximal lower extremity: Secondary | ICD-10-CM | POA: Diagnosis present

## 2012-02-16 DIAGNOSIS — D751 Secondary polycythemia: Secondary | ICD-10-CM | POA: Diagnosis present

## 2012-02-16 DIAGNOSIS — I824Y9 Acute embolism and thrombosis of unspecified deep veins of unspecified proximal lower extremity: Secondary | ICD-10-CM | POA: Diagnosis present

## 2012-02-16 DIAGNOSIS — J9600 Acute respiratory failure, unspecified whether with hypoxia or hypercapnia: Secondary | ICD-10-CM | POA: Diagnosis not present

## 2012-02-16 DIAGNOSIS — R079 Chest pain, unspecified: Secondary | ICD-10-CM | POA: Diagnosis not present

## 2012-02-16 DIAGNOSIS — Z6833 Body mass index (BMI) 33.0-33.9, adult: Secondary | ICD-10-CM | POA: Diagnosis not present

## 2012-02-16 DIAGNOSIS — Z86718 Personal history of other venous thrombosis and embolism: Secondary | ICD-10-CM | POA: Diagnosis not present

## 2012-02-16 DIAGNOSIS — E669 Obesity, unspecified: Secondary | ICD-10-CM | POA: Diagnosis present

## 2012-02-16 DIAGNOSIS — R042 Hemoptysis: Secondary | ICD-10-CM | POA: Diagnosis present

## 2012-02-16 DIAGNOSIS — E785 Hyperlipidemia, unspecified: Secondary | ICD-10-CM | POA: Diagnosis present

## 2012-02-16 DIAGNOSIS — Z86711 Personal history of pulmonary embolism: Secondary | ICD-10-CM | POA: Diagnosis not present

## 2012-02-16 DIAGNOSIS — R1084 Generalized abdominal pain: Secondary | ICD-10-CM | POA: Diagnosis not present

## 2012-02-16 DIAGNOSIS — Z9079 Acquired absence of other genital organ(s): Secondary | ICD-10-CM | POA: Diagnosis not present

## 2012-02-16 DIAGNOSIS — I10 Essential (primary) hypertension: Secondary | ICD-10-CM | POA: Diagnosis not present

## 2012-02-16 DIAGNOSIS — E781 Pure hyperglyceridemia: Secondary | ICD-10-CM | POA: Diagnosis not present

## 2012-02-16 DIAGNOSIS — I2510 Atherosclerotic heart disease of native coronary artery without angina pectoris: Secondary | ICD-10-CM | POA: Diagnosis present

## 2012-02-16 DIAGNOSIS — D689 Coagulation defect, unspecified: Secondary | ICD-10-CM | POA: Diagnosis not present

## 2012-02-16 DIAGNOSIS — R109 Unspecified abdominal pain: Secondary | ICD-10-CM | POA: Diagnosis not present

## 2012-02-25 DIAGNOSIS — D689 Coagulation defect, unspecified: Secondary | ICD-10-CM | POA: Diagnosis not present

## 2012-02-25 DIAGNOSIS — M48061 Spinal stenosis, lumbar region without neurogenic claudication: Secondary | ICD-10-CM | POA: Diagnosis not present

## 2012-02-25 DIAGNOSIS — IMO0002 Reserved for concepts with insufficient information to code with codable children: Secondary | ICD-10-CM | POA: Diagnosis not present

## 2012-02-27 DIAGNOSIS — M5412 Radiculopathy, cervical region: Secondary | ICD-10-CM | POA: Diagnosis not present

## 2012-02-27 DIAGNOSIS — M9981 Other biomechanical lesions of cervical region: Secondary | ICD-10-CM | POA: Diagnosis not present

## 2012-02-29 DIAGNOSIS — D689 Coagulation defect, unspecified: Secondary | ICD-10-CM | POA: Diagnosis not present

## 2012-03-07 DIAGNOSIS — D689 Coagulation defect, unspecified: Secondary | ICD-10-CM | POA: Diagnosis not present

## 2012-03-07 DIAGNOSIS — C6290 Malignant neoplasm of unspecified testis, unspecified whether descended or undescended: Secondary | ICD-10-CM | POA: Diagnosis not present

## 2012-03-10 DIAGNOSIS — M999 Biomechanical lesion, unspecified: Secondary | ICD-10-CM | POA: Diagnosis not present

## 2012-03-10 DIAGNOSIS — M5430 Sciatica, unspecified side: Secondary | ICD-10-CM | POA: Diagnosis not present

## 2012-03-21 DIAGNOSIS — D689 Coagulation defect, unspecified: Secondary | ICD-10-CM | POA: Diagnosis not present

## 2012-03-24 DIAGNOSIS — IMO0002 Reserved for concepts with insufficient information to code with codable children: Secondary | ICD-10-CM | POA: Diagnosis not present

## 2012-03-24 DIAGNOSIS — M5430 Sciatica, unspecified side: Secondary | ICD-10-CM | POA: Diagnosis not present

## 2012-03-24 DIAGNOSIS — M999 Biomechanical lesion, unspecified: Secondary | ICD-10-CM | POA: Diagnosis not present

## 2012-03-28 DIAGNOSIS — M5430 Sciatica, unspecified side: Secondary | ICD-10-CM | POA: Diagnosis not present

## 2012-03-28 DIAGNOSIS — M999 Biomechanical lesion, unspecified: Secondary | ICD-10-CM | POA: Diagnosis not present

## 2012-03-31 DIAGNOSIS — M999 Biomechanical lesion, unspecified: Secondary | ICD-10-CM | POA: Diagnosis not present

## 2012-03-31 DIAGNOSIS — M5430 Sciatica, unspecified side: Secondary | ICD-10-CM | POA: Diagnosis not present

## 2012-04-04 DIAGNOSIS — D689 Coagulation defect, unspecified: Secondary | ICD-10-CM | POA: Diagnosis not present

## 2012-04-04 DIAGNOSIS — C6290 Malignant neoplasm of unspecified testis, unspecified whether descended or undescended: Secondary | ICD-10-CM | POA: Diagnosis not present

## 2012-04-04 DIAGNOSIS — E869 Volume depletion, unspecified: Secondary | ICD-10-CM | POA: Diagnosis not present

## 2012-04-18 DIAGNOSIS — E538 Deficiency of other specified B group vitamins: Secondary | ICD-10-CM | POA: Diagnosis not present

## 2012-04-18 DIAGNOSIS — E785 Hyperlipidemia, unspecified: Secondary | ICD-10-CM | POA: Diagnosis not present

## 2012-04-18 DIAGNOSIS — R5381 Other malaise: Secondary | ICD-10-CM | POA: Diagnosis not present

## 2012-04-18 DIAGNOSIS — I10 Essential (primary) hypertension: Secondary | ICD-10-CM | POA: Diagnosis not present

## 2012-04-18 DIAGNOSIS — D689 Coagulation defect, unspecified: Secondary | ICD-10-CM | POA: Diagnosis not present

## 2012-04-18 DIAGNOSIS — E559 Vitamin D deficiency, unspecified: Secondary | ICD-10-CM | POA: Diagnosis not present

## 2012-04-18 DIAGNOSIS — M545 Low back pain, unspecified: Secondary | ICD-10-CM | POA: Diagnosis not present

## 2012-04-18 DIAGNOSIS — E669 Obesity, unspecified: Secondary | ICD-10-CM | POA: Diagnosis not present

## 2012-04-18 DIAGNOSIS — M999 Biomechanical lesion, unspecified: Secondary | ICD-10-CM | POA: Diagnosis not present

## 2012-04-18 DIAGNOSIS — E291 Testicular hypofunction: Secondary | ICD-10-CM | POA: Diagnosis not present

## 2012-04-18 DIAGNOSIS — R7301 Impaired fasting glucose: Secondary | ICD-10-CM | POA: Diagnosis not present

## 2012-04-21 DIAGNOSIS — IMO0002 Reserved for concepts with insufficient information to code with codable children: Secondary | ICD-10-CM | POA: Diagnosis not present

## 2012-04-23 HISTORY — PX: OTHER SURGICAL HISTORY: SHX169

## 2012-04-24 DIAGNOSIS — M5380 Other specified dorsopathies, site unspecified: Secondary | ICD-10-CM | POA: Diagnosis not present

## 2012-04-24 DIAGNOSIS — IMO0002 Reserved for concepts with insufficient information to code with codable children: Secondary | ICD-10-CM | POA: Diagnosis not present

## 2012-04-24 DIAGNOSIS — M48061 Spinal stenosis, lumbar region without neurogenic claudication: Secondary | ICD-10-CM | POA: Diagnosis not present

## 2012-04-24 DIAGNOSIS — M545 Low back pain, unspecified: Secondary | ICD-10-CM | POA: Diagnosis not present

## 2012-04-30 DIAGNOSIS — R0602 Shortness of breath: Secondary | ICD-10-CM | POA: Diagnosis not present

## 2012-04-30 DIAGNOSIS — I824Y9 Acute embolism and thrombosis of unspecified deep veins of unspecified proximal lower extremity: Secondary | ICD-10-CM | POA: Diagnosis not present

## 2012-04-30 DIAGNOSIS — I2699 Other pulmonary embolism without acute cor pulmonale: Secondary | ICD-10-CM | POA: Diagnosis not present

## 2012-04-30 DIAGNOSIS — R109 Unspecified abdominal pain: Secondary | ICD-10-CM | POA: Diagnosis not present

## 2012-04-30 DIAGNOSIS — R079 Chest pain, unspecified: Secondary | ICD-10-CM | POA: Diagnosis not present

## 2012-05-02 DIAGNOSIS — R079 Chest pain, unspecified: Secondary | ICD-10-CM | POA: Diagnosis not present

## 2012-07-30 DIAGNOSIS — E669 Obesity, unspecified: Secondary | ICD-10-CM | POA: Diagnosis not present

## 2012-07-30 DIAGNOSIS — R5381 Other malaise: Secondary | ICD-10-CM | POA: Diagnosis not present

## 2012-07-30 DIAGNOSIS — E785 Hyperlipidemia, unspecified: Secondary | ICD-10-CM | POA: Diagnosis not present

## 2012-07-30 DIAGNOSIS — E559 Vitamin D deficiency, unspecified: Secondary | ICD-10-CM | POA: Diagnosis not present

## 2012-07-30 DIAGNOSIS — E291 Testicular hypofunction: Secondary | ICD-10-CM | POA: Diagnosis not present

## 2012-07-30 DIAGNOSIS — E538 Deficiency of other specified B group vitamins: Secondary | ICD-10-CM | POA: Diagnosis not present

## 2012-07-30 DIAGNOSIS — R7301 Impaired fasting glucose: Secondary | ICD-10-CM | POA: Diagnosis not present

## 2012-07-30 DIAGNOSIS — I10 Essential (primary) hypertension: Secondary | ICD-10-CM | POA: Diagnosis not present

## 2012-07-30 DIAGNOSIS — R5383 Other fatigue: Secondary | ICD-10-CM | POA: Diagnosis not present

## 2014-04-30 DIAGNOSIS — D689 Coagulation defect, unspecified: Secondary | ICD-10-CM | POA: Diagnosis not present

## 2014-05-07 DIAGNOSIS — D689 Coagulation defect, unspecified: Secondary | ICD-10-CM | POA: Diagnosis not present

## 2014-05-11 DIAGNOSIS — M170 Bilateral primary osteoarthritis of knee: Secondary | ICD-10-CM | POA: Diagnosis not present

## 2014-05-11 DIAGNOSIS — M25561 Pain in right knee: Secondary | ICD-10-CM | POA: Diagnosis not present

## 2014-05-11 DIAGNOSIS — S83231D Complex tear of medial meniscus, current injury, right knee, subsequent encounter: Secondary | ICD-10-CM | POA: Diagnosis not present

## 2014-05-14 DIAGNOSIS — D689 Coagulation defect, unspecified: Secondary | ICD-10-CM | POA: Diagnosis not present

## 2014-05-14 DIAGNOSIS — E869 Volume depletion, unspecified: Secondary | ICD-10-CM | POA: Diagnosis not present

## 2014-05-14 DIAGNOSIS — D751 Secondary polycythemia: Secondary | ICD-10-CM | POA: Diagnosis not present

## 2014-05-18 DIAGNOSIS — M5416 Radiculopathy, lumbar region: Secondary | ICD-10-CM | POA: Diagnosis not present

## 2014-05-20 DIAGNOSIS — D689 Coagulation defect, unspecified: Secondary | ICD-10-CM | POA: Diagnosis not present

## 2014-06-04 DIAGNOSIS — D689 Coagulation defect, unspecified: Secondary | ICD-10-CM | POA: Diagnosis not present

## 2014-06-09 DIAGNOSIS — E784 Other hyperlipidemia: Secondary | ICD-10-CM | POA: Diagnosis not present

## 2014-06-09 DIAGNOSIS — E539 Vitamin B deficiency, unspecified: Secondary | ICD-10-CM | POA: Diagnosis not present

## 2014-06-09 DIAGNOSIS — E291 Testicular hypofunction: Secondary | ICD-10-CM | POA: Diagnosis not present

## 2014-06-09 DIAGNOSIS — E782 Mixed hyperlipidemia: Secondary | ICD-10-CM | POA: Diagnosis not present

## 2014-06-09 DIAGNOSIS — R7301 Impaired fasting glucose: Secondary | ICD-10-CM | POA: Diagnosis not present

## 2014-06-09 DIAGNOSIS — E559 Vitamin D deficiency, unspecified: Secondary | ICD-10-CM | POA: Diagnosis not present

## 2014-06-09 DIAGNOSIS — N529 Male erectile dysfunction, unspecified: Secondary | ICD-10-CM | POA: Diagnosis not present

## 2014-06-09 LAB — TSH: TSH: 1.53 u[IU]/mL (ref 0.41–5.90)

## 2014-06-09 LAB — TESTOSTERONE: TESTOSTERONE: 296

## 2014-06-09 LAB — HEPATIC FUNCTION PANEL
ALT: 43 U/L — AB (ref 10–40)
AST: 42 U/L — AB (ref 14–40)

## 2014-06-09 LAB — LIPID PANEL
Cholesterol: 191 mg/dL (ref 0–200)
HDL: 30 mg/dL — AB (ref 35–70)
LDL Cholesterol: 107 mg/dL
Triglycerides: 168 mg/dL — AB (ref 40–160)

## 2014-06-09 LAB — BASIC METABOLIC PANEL
Creatinine: 1 mg/dL (ref 0.6–1.3)
Glucose: 143 mg/dL
Potassium: 4.7 mmol/L (ref 3.4–5.3)

## 2014-06-09 LAB — VITAMIN D 25 HYDROXY (VIT D DEFICIENCY, FRACTURES): Vit D, 25-Hydroxy: 50

## 2014-06-09 LAB — ESTRADIOL: Estradiol: 71

## 2014-06-09 LAB — FSH/LH: FSH: <1

## 2014-06-09 LAB — HEMOGLOBIN A1C: HEMOGLOBIN A1C: 6.3 % — AB (ref 4.0–6.0)

## 2014-06-11 DIAGNOSIS — D689 Coagulation defect, unspecified: Secondary | ICD-10-CM | POA: Diagnosis not present

## 2014-06-18 DIAGNOSIS — I252 Old myocardial infarction: Secondary | ICD-10-CM | POA: Diagnosis not present

## 2014-06-18 DIAGNOSIS — D689 Coagulation defect, unspecified: Secondary | ICD-10-CM | POA: Diagnosis not present

## 2014-06-18 DIAGNOSIS — R7301 Impaired fasting glucose: Secondary | ICD-10-CM | POA: Diagnosis not present

## 2014-06-18 DIAGNOSIS — E782 Mixed hyperlipidemia: Secondary | ICD-10-CM | POA: Diagnosis not present

## 2014-06-18 DIAGNOSIS — D751 Secondary polycythemia: Secondary | ICD-10-CM | POA: Diagnosis not present

## 2014-06-18 DIAGNOSIS — E291 Testicular hypofunction: Secondary | ICD-10-CM | POA: Diagnosis not present

## 2014-06-21 DIAGNOSIS — D751 Secondary polycythemia: Secondary | ICD-10-CM | POA: Diagnosis not present

## 2014-06-21 DIAGNOSIS — E869 Volume depletion, unspecified: Secondary | ICD-10-CM | POA: Diagnosis not present

## 2014-06-22 DIAGNOSIS — M5416 Radiculopathy, lumbar region: Secondary | ICD-10-CM | POA: Diagnosis not present

## 2014-06-25 DIAGNOSIS — D689 Coagulation defect, unspecified: Secondary | ICD-10-CM | POA: Diagnosis not present

## 2014-07-09 DIAGNOSIS — D689 Coagulation defect, unspecified: Secondary | ICD-10-CM | POA: Diagnosis not present

## 2014-07-14 DIAGNOSIS — D689 Coagulation defect, unspecified: Secondary | ICD-10-CM | POA: Diagnosis not present

## 2014-07-20 DIAGNOSIS — M5116 Intervertebral disc disorders with radiculopathy, lumbar region: Secondary | ICD-10-CM | POA: Diagnosis not present

## 2014-07-20 DIAGNOSIS — G4700 Insomnia, unspecified: Secondary | ICD-10-CM | POA: Diagnosis not present

## 2014-07-20 DIAGNOSIS — I10 Essential (primary) hypertension: Secondary | ICD-10-CM | POA: Diagnosis not present

## 2014-07-20 DIAGNOSIS — E785 Hyperlipidemia, unspecified: Secondary | ICD-10-CM | POA: Diagnosis not present

## 2014-07-30 DIAGNOSIS — M9903 Segmental and somatic dysfunction of lumbar region: Secondary | ICD-10-CM | POA: Diagnosis not present

## 2014-07-30 DIAGNOSIS — M5106 Intervertebral disc disorders with myelopathy, lumbar region: Secondary | ICD-10-CM | POA: Diagnosis not present

## 2014-07-30 DIAGNOSIS — M5432 Sciatica, left side: Secondary | ICD-10-CM | POA: Diagnosis not present

## 2014-07-30 DIAGNOSIS — M5431 Sciatica, right side: Secondary | ICD-10-CM | POA: Diagnosis not present

## 2014-07-30 DIAGNOSIS — M9902 Segmental and somatic dysfunction of thoracic region: Secondary | ICD-10-CM | POA: Diagnosis not present

## 2014-07-30 DIAGNOSIS — M25551 Pain in right hip: Secondary | ICD-10-CM | POA: Diagnosis not present

## 2014-07-30 DIAGNOSIS — M9905 Segmental and somatic dysfunction of pelvic region: Secondary | ICD-10-CM | POA: Diagnosis not present

## 2014-07-30 DIAGNOSIS — M9904 Segmental and somatic dysfunction of sacral region: Secondary | ICD-10-CM | POA: Diagnosis not present

## 2014-07-30 DIAGNOSIS — M5022 Other cervical disc displacement, mid-cervical region: Secondary | ICD-10-CM | POA: Diagnosis not present

## 2014-07-30 DIAGNOSIS — M546 Pain in thoracic spine: Secondary | ICD-10-CM | POA: Diagnosis not present

## 2014-07-30 DIAGNOSIS — M9901 Segmental and somatic dysfunction of cervical region: Secondary | ICD-10-CM | POA: Diagnosis not present

## 2014-08-04 DIAGNOSIS — Y999 Unspecified external cause status: Secondary | ICD-10-CM | POA: Diagnosis not present

## 2014-08-04 DIAGNOSIS — Z79899 Other long term (current) drug therapy: Secondary | ICD-10-CM | POA: Diagnosis not present

## 2014-08-04 DIAGNOSIS — I252 Old myocardial infarction: Secondary | ICD-10-CM | POA: Diagnosis not present

## 2014-08-04 DIAGNOSIS — M545 Low back pain: Secondary | ICD-10-CM | POA: Diagnosis not present

## 2014-08-10 ENCOUNTER — Ambulatory Visit (INDEPENDENT_AMBULATORY_CARE_PROVIDER_SITE_OTHER): Payer: Medicare Other | Admitting: Family Medicine

## 2014-08-10 ENCOUNTER — Encounter: Payer: Self-pay | Admitting: Family Medicine

## 2014-08-10 VITALS — BP 130/83 | Temp 99.2°F | Ht 74.0 in | Wt 297.0 lb

## 2014-08-10 DIAGNOSIS — I10 Essential (primary) hypertension: Secondary | ICD-10-CM

## 2014-08-10 DIAGNOSIS — R911 Solitary pulmonary nodule: Secondary | ICD-10-CM

## 2014-08-10 DIAGNOSIS — M961 Postlaminectomy syndrome, not elsewhere classified: Secondary | ICD-10-CM | POA: Insufficient documentation

## 2014-08-10 DIAGNOSIS — E291 Testicular hypofunction: Secondary | ICD-10-CM | POA: Diagnosis not present

## 2014-08-10 DIAGNOSIS — E785 Hyperlipidemia, unspecified: Secondary | ICD-10-CM | POA: Insufficient documentation

## 2014-08-10 DIAGNOSIS — I2510 Atherosclerotic heart disease of native coronary artery without angina pectoris: Secondary | ICD-10-CM

## 2014-08-10 DIAGNOSIS — I2699 Other pulmonary embolism without acute cor pulmonale: Secondary | ICD-10-CM | POA: Diagnosis not present

## 2014-08-10 DIAGNOSIS — Z7901 Long term (current) use of anticoagulants: Secondary | ICD-10-CM

## 2014-08-10 DIAGNOSIS — R7309 Other abnormal glucose: Secondary | ICD-10-CM

## 2014-08-10 DIAGNOSIS — M4806 Spinal stenosis, lumbar region: Secondary | ICD-10-CM

## 2014-08-10 DIAGNOSIS — M48061 Spinal stenosis, lumbar region without neurogenic claudication: Secondary | ICD-10-CM | POA: Insufficient documentation

## 2014-08-10 DIAGNOSIS — Z86711 Personal history of pulmonary embolism: Secondary | ICD-10-CM | POA: Insufficient documentation

## 2014-08-10 DIAGNOSIS — R7303 Prediabetes: Secondary | ICD-10-CM | POA: Insufficient documentation

## 2014-08-10 MED ORDER — METOPROLOL SUCCINATE ER 50 MG PO TB24: 50.0000 mg | ORAL_TABLET | Freq: Every day | ORAL | Status: DC

## 2014-08-10 MED ORDER — OXYCODONE-ACETAMINOPHEN 10-325 MG PO TABS: 1.0000 | ORAL_TABLET | Freq: Four times a day (QID) | ORAL | Status: DC | PRN

## 2014-08-10 NOTE — Progress Notes (Signed)
CC: Jeff Green is a 59 y.o. male is here for mangement of medication   Subjective: HPI:  Very pleasant 48 shoulder to establish care  He tells me he has a history of 2 pulmonary emboli episodes and has been on Coumadin for at least 3 years on a daily basis now for anti-coagulation. He also has a vena cava filter. INR was last checked 1 month ago and was normal. He's currently taking Coumadin at a dose of 9 mg and 10 mg alternating every other day. He's worried that his INR may be supratherapeutic right now due to easy bruisability. His former physician has been using a INR goal of 3.0. He tells me had a CT scan about a month ago of the chest for surveillance of his pulmonary embolus however looking at some of his paperwork I think this is actually due to a pulmonary nodule. He is uncertain about results  Reports a history of hyperlipidemia. He is currently taking Lipitor and a daily basis without myalgias) upper quadrant pain. He is uncertain the last time cholesterol was checked.  History of essential hypertension currently taking amlodipine and metoprolol. Requesting metoprolol refills. He tells me on this regimen blood pressure has been normal at least for the last 3 months.  History of hypogonadism currently taking testosterone every 2 weeks. He tells me when his testosterone level gets too high he has pain behind eyes which she is currently experiencing. He is uncertain when his last testosterone level was checked but there is been no change to his regimen for at least the last 3 months that he can recall.  He brings in paperwork showing an A1c of 6.1 obtained about 3 months ago. He has never been on any anti-hyperglycemic medications.  He tells me that he suffers from chronic back pain with radicular component of pain down the right leg. It's midline back pain and worse with any walking long distances or carrying every objects. No bowel or bladder dysfunction or saddle paresthesia. He  was originally seen a pain management specialist for this he was providing him with Percocet which greatly reduces his pain. He denies any new motor or sensory disturbances in the lower extremities  History of coronary artery disease with sounds to be have been a non-ST elevated myocardial infarction requiring stenting to the LAD. He is uncertain exactly when this happened but was seen a cardiologist in [place] for this. He denies any exertional chest pain and has not had to use any nitroglycerin since this procedure occurred  Review of Systems - General ROS: negative for - chills, fever, night sweats, weight gain or weight loss Ophthalmic ROS: negative for - decreased vision Psychological ROS: negative for - anxiety or depression ENT ROS: negative for - hearing change, nasal congestion, tinnitus or allergies Hematological and Lymphatic ROS: negative for - bleeding problems, bruising or swollen lymph nodes Breast ROS: negative Respiratory ROS: no cough, shortness of breath, or wheezing Cardiovascular ROS: no chest pain or dyspnea on exertion Gastrointestinal ROS: no abdominal pain, change in bowel habits, or black or bloody stools Genito-Urinary ROS: negative for - genital discharge, genital ulcers, incontinence or abnormal bleeding from genitals Musculoskeletal ROS: negative for - joint pain or muscle pain other than that described above Neurological ROS: negative for - headaches or memory loss Dermatological ROS: negative for lumps, mole changes, rash and skin lesion changes  Past Medical History  Diagnosis Date  . Hypertension   . High cholesterol   . Congestive heart  failure (CHF)     No past surgical history on file. Family History  Problem Relation Age of Onset  . Thyroid cancer Mother   . Depression Mother   . High Cholesterol Father   . High blood pressure Father   . Stroke      Grandfather    History   Social History  . Marital Status: Married    Spouse Name: N/A  .  Number of Children: N/A  . Years of Education: N/A   Occupational History  . Not on file.   Social History Main Topics  . Smoking status: Former Smoker    Quit date: 08/10/1999  . Smokeless tobacco: Not on file  . Alcohol Use: No  . Drug Use: No  . Sexual Activity:    Partners: Female   Other Topics Concern  . Not on file   Social History Narrative  . No narrative on file     Objective: BP 130/83 mmHg  Temp(Src) 99.2 F (37.3 C)  Ht 6\' 2"  (1.88 m)  Wt 297 lb (134.718 kg)  BMI 38.12 kg/m2  General: Alert and Oriented, No Acute Distress HEENT: Pupils equal, round, reactive to light. Conjunctivae clear.  Moist mucous membranes Lungs: Clear to auscultation bilaterally, no wheezing/ronchi/rales.  Comfortable work of breathing. Good air movement. Cardiac: Regular rate and rhythm. Normal S1/S2.  No murmurs, rubs, nor gallops.   Abdomen: Mild obesity Extremities: No peripheral edema.  Strong peripheral pulses.  Mental Status: No depression, anxiety, nor agitation. Skin: Warm and dry.  Assessment & Plan: [name] was seen today for mangement of medication.  Diagnoses and all orders for this visit:  Essential hypertension Orders: -     metoprolol succinate (TOPROL-XL) 50 MG 24 hr tablet; Take 1 tablet (50 mg total) by mouth daily. Take with or immediately following a meal.  Hyperlipidemia  Hypogonadism in male Orders: -     Testosterone, Free, Total, SHBG  Chronic anticoagulation  Prediabetes  Post laminectomy syndrome Orders: -     oxyCODONE-acetaminophen (PERCOCET) 10-325 MG per tablet; Take 1 tablet by mouth every 6 (six) hours as needed for pain. -     Ambulatory referral to Pain Clinic  Spinal stenosis of lumbar region Orders: -     Ambulatory referral to Pain Clinic  Recurrent pulmonary emboli Orders: -     INR/PT  Coronary artery disease involving native coronary artery of native heart without angina pectoris  Solitary pulmonary  nodule   Essential hypertension: Controlled continue metoprolol and amlodipine Hyperlipidemia: Continue Lipitor pending review of outside records including most recent lipid panel and liver enzymes Hypogonadism: Checking testosterone today, continue current regimen of 0.7 mL every other week Recurrent pulmonary emboli: Checking INR today continue alternating 9 mg and 10 mg regimen of Coumadin pending results Prediabetes: Currently controlled, focusing on diet and exercise Post pneumectomy syndrome and spinal stenosis: Referral to pain clinic with Percocet to be provided for the time between now and when he can establish with pain management or 3 months whichever occurs first. referral has been placed CAD: Currently stable, requesting outside records to determine if he needs to see cardiology Solitary pulmonary nodule: Requesting outside records of most recent CT scan results for clarification.  Return in about 1 month (around 09/09/2014) for Routine follow up and review of outside records.Marland Kitchen

## 2014-08-11 ENCOUNTER — Telehealth: Payer: Self-pay | Admitting: *Deleted

## 2014-08-11 DIAGNOSIS — M961 Postlaminectomy syndrome, not elsewhere classified: Secondary | ICD-10-CM

## 2014-08-11 LAB — TESTOSTERONE, FREE, TOTAL, SHBG
Sex Hormone Binding: 21 nmol/L — ABNORMAL LOW (ref 22–77)
Testosterone, Free: 79.1 pg/mL (ref 47.0–244.0)
Testosterone-% Free: 2.5 % (ref 1.6–2.9)
Testosterone: 316 ng/dL (ref 300–890)

## 2014-08-11 LAB — PROTIME-INR
INR: 3.26 — AB (ref <1.50)
PROTHROMBIN TIME: 33.2 s — AB (ref 11.6–15.2)

## 2014-08-11 NOTE — Telephone Encounter (Signed)
Pt wants a referral to [facility] chiropractor. He has already seen him.

## 2014-08-12 ENCOUNTER — Encounter: Payer: Self-pay | Admitting: Family Medicine

## 2014-08-12 ENCOUNTER — Telehealth: Payer: Self-pay | Admitting: Family Medicine

## 2014-08-12 ENCOUNTER — Other Ambulatory Visit: Payer: Self-pay | Admitting: Family Medicine

## 2014-08-12 DIAGNOSIS — C6290 Malignant neoplasm of unspecified testis, unspecified whether descended or undescended: Secondary | ICD-10-CM | POA: Insufficient documentation

## 2014-08-12 DIAGNOSIS — Z8547 Personal history of malignant neoplasm of testis: Secondary | ICD-10-CM | POA: Insufficient documentation

## 2014-08-12 DIAGNOSIS — I2510 Atherosclerotic heart disease of native coronary artery without angina pectoris: Secondary | ICD-10-CM

## 2014-08-12 NOTE — Telephone Encounter (Signed)
Left message on vm

## 2014-08-12 NOTE — Telephone Encounter (Signed)
Jeff Green, Will you please let paient know that I got his records from the [facility] [place] and would  Recommend that he establish with a cardiologist [place].  A referral has been placed.

## 2014-08-13 DIAGNOSIS — M9905 Segmental and somatic dysfunction of pelvic region: Secondary | ICD-10-CM | POA: Diagnosis not present

## 2014-08-13 DIAGNOSIS — M546 Pain in thoracic spine: Secondary | ICD-10-CM | POA: Diagnosis not present

## 2014-08-13 DIAGNOSIS — M9903 Segmental and somatic dysfunction of lumbar region: Secondary | ICD-10-CM | POA: Diagnosis not present

## 2014-08-13 DIAGNOSIS — M25551 Pain in right hip: Secondary | ICD-10-CM | POA: Diagnosis not present

## 2014-08-13 DIAGNOSIS — M9902 Segmental and somatic dysfunction of thoracic region: Secondary | ICD-10-CM | POA: Diagnosis not present

## 2014-08-13 DIAGNOSIS — M5432 Sciatica, left side: Secondary | ICD-10-CM | POA: Diagnosis not present

## 2014-08-13 DIAGNOSIS — M5431 Sciatica, right side: Secondary | ICD-10-CM | POA: Diagnosis not present

## 2014-08-13 DIAGNOSIS — M9901 Segmental and somatic dysfunction of cervical region: Secondary | ICD-10-CM | POA: Diagnosis not present

## 2014-08-13 DIAGNOSIS — M9904 Segmental and somatic dysfunction of sacral region: Secondary | ICD-10-CM | POA: Diagnosis not present

## 2014-08-13 DIAGNOSIS — M5022 Other cervical disc displacement, mid-cervical region: Secondary | ICD-10-CM | POA: Diagnosis not present

## 2014-08-13 DIAGNOSIS — M5106 Intervertebral disc disorders with myelopathy, lumbar region: Secondary | ICD-10-CM | POA: Diagnosis not present

## 2014-08-16 ENCOUNTER — Telehealth: Payer: Self-pay | Admitting: Family Medicine

## 2014-08-16 ENCOUNTER — Encounter: Payer: Self-pay | Admitting: Family Medicine

## 2014-08-16 DIAGNOSIS — R918 Other nonspecific abnormal finding of lung field: Secondary | ICD-10-CM | POA: Insufficient documentation

## 2014-08-16 NOTE — Telephone Encounter (Signed)
Jeff Green, Will you please let patient know that I received his CT scan of the chest from April 2015 and would recommend that he have this repeated for surveillance of pulmonary nodules if it has not been done in the last six months.  An order has been placed to our office downstairs.

## 2014-08-16 NOTE — Telephone Encounter (Signed)
Mailbox is full.

## 2014-08-19 NOTE — Telephone Encounter (Signed)
Left message on vm

## 2014-08-24 ENCOUNTER — Encounter: Payer: Self-pay | Admitting: Family Medicine

## 2014-08-24 ENCOUNTER — Ambulatory Visit (INDEPENDENT_AMBULATORY_CARE_PROVIDER_SITE_OTHER): Payer: Medicare Other | Admitting: Family Medicine

## 2014-08-24 VITALS — BP 118/77 | HR 80 | Ht 74.0 in | Wt 297.0 lb

## 2014-08-24 DIAGNOSIS — I2699 Other pulmonary embolism without acute cor pulmonale: Secondary | ICD-10-CM | POA: Diagnosis not present

## 2014-08-24 DIAGNOSIS — E785 Hyperlipidemia, unspecified: Secondary | ICD-10-CM | POA: Diagnosis not present

## 2014-08-24 DIAGNOSIS — I2510 Atherosclerotic heart disease of native coronary artery without angina pectoris: Secondary | ICD-10-CM

## 2014-08-24 DIAGNOSIS — R918 Other nonspecific abnormal finding of lung field: Secondary | ICD-10-CM

## 2014-08-24 LAB — POCT INR: INR: 2.8

## 2014-08-24 MED ORDER — PRAVASTATIN SODIUM 40 MG PO TABS: 40.0000 mg | ORAL_TABLET | Freq: Every day | ORAL | Status: DC

## 2014-08-24 NOTE — Progress Notes (Signed)
CC: Jeff Green is a 59 y.o. male is here for Side affects of meds   Subjective: HPI:  Complains of bilateral knee pain and calf pain with cramping of the calf that began 3 weeks ago and slowly was worsening until it reached a severe severity as of last week. He mentioned this to a friend and they pointed out this could be a side effect of Lipitor. He stopped taking this over the weekend and has had almost 75% resolution of the pain and cramping. He tells me he was having to stop every few feet due to the pain to rest however this is no longer present. He tells me that he was recently switched to Lipitor from some other statin one or 2 months ago by his former physician because they felt Lipitor would be more effective at preventing further cardiovascular disease. Denies any other joint or muscle pain other than chronic back pain. Denies weakness, nor any other motor or sensory disturbances. Currently no pain with ambulation.  On outside records there is a report of multiple pulmonary nodules with the most recent CT scan that I have access to being from April 2015. He has a history of testicular cancer. I tried contacting him with our support staff to schedule a CT scan in late April however we were only able to reach his voicemail. I've asked him today to go down for CT scan office and schedule a time that fits his schedule within the next few weeks. He tells me that he is 100% confident that he had a CT scan of the chest looking at pulmonary nodules before he moved to [place] sometime in late 2015 or early 2016 and the size of the nodules had actually shrunk. He denies cough, wheezing, chest pain, shortness of breath   Review Of Systems Outlined In HPI  Past Medical History  Diagnosis Date  . Hypertension   . High cholesterol   . Congestive heart failure (CHF)     No past surgical history on file. Family History  Problem Relation Age of Onset  . Thyroid cancer Mother   .  Depression Mother   . High Cholesterol Father   . High blood pressure Father   . Stroke      Grandfather    History   Social History  . Marital Status: Married    Spouse Name: N/A  . Number of Children: N/A  . Years of Education: N/A   Occupational History  . Not on file.   Social History Main Topics  . Smoking status: Former Smoker    Quit date: 08/10/1999  . Smokeless tobacco: Not on file  . Alcohol Use: No  . Drug Use: No  . Sexual Activity:    Partners: Female   Other Topics Concern  . Not on file   Social History Narrative     Objective: BP 118/77 mmHg  Pulse 80  Ht 6\' 2"  (1.88 m)  Wt 297 lb (134.718 kg)  BMI 38.12 kg/m2  Vital signs reviewed. General: Alert and Oriented, No Acute Distress HEENT: Pupils equal, round, reactive to light. Conjunctivae clear.  External ears unremarkable.  Moist mucous membranes. Lungs: Clear and comfortable work of breathing, speaking in full sentences without accessory muscle use. Cardiac: Regular rate and rhythm.  Neuro: CN II-XII grossly intact, gait normal. Extremities: No peripheral edema.  Strong peripheral pulses.  Mental Status: No depression, anxiety, nor agitation. Logical though process. Skin: Warm and dry.  Assessment & Plan: Jeff Green  was seen today for side affects of meds.  Diagnoses and all orders for this visit:  Hyperlipidemia Orders: -     pravastatin (PRAVACHOL) 40 MG tablet; Take 1 tablet (40 mg total) by mouth daily.  Multiple pulmonary nodules  Recurrent pulmonary emboli Orders: -     POCT INR   Hyperlipidemia: Intolerance to Lipitor therefore switching to pravastatin. I've asked him to wait to take this medication until his symptoms of myalgias and knee pain are 100% resolved. Follow-up in 3 months from this date to have lipid panel. Multiple pulmonary nodules: Discussed the since I don't have his most recent self-reported CT scan I cannot fully agree that he does not need a repeat CT scan. I've  encouraged him to go downstairs to get scheduled for this however he is adamant that there is no need to get one. I cannot seem to talk him into getting a repeat CT scan. Recurrent pulmonary emboli: INR 2.8 therapeutic no change to Coumadin regimen.   Return in about 3 months (around 11/24/2014) for Cholesterol check after being on pravastatin. Marland Kitchen

## 2014-08-25 ENCOUNTER — Telehealth: Payer: Self-pay | Admitting: Family Medicine

## 2014-08-25 NOTE — Telephone Encounter (Signed)
Hello  Yes- we have left our office # for [facility]- [phone]. Thanks

## 2014-08-25 NOTE — Telephone Encounter (Signed)
Jeff Green,  Does your office have a phone number that the patient can call to contact your office for scheduling?

## 2014-08-25 NOTE — Telephone Encounter (Signed)
Pt aware he has not called the cardiologist yet. They are on his list to call

## 2014-08-25 NOTE — Telephone Encounter (Signed)
Seth Bake, Will you please try calling patient about this and my recommendation for him to call their office to establish care with an already placed referral?

## 2014-08-25 NOTE — Telephone Encounter (Signed)
New Message  Thank you for the referral. Our office has attempted to contact this patient three times and will now be removed from our [facility]. Thank you.

## 2014-08-26 ENCOUNTER — Encounter: Payer: Self-pay | Admitting: Family Medicine

## 2014-08-26 ENCOUNTER — Telehealth: Payer: Self-pay | Admitting: Family Medicine

## 2014-08-26 DIAGNOSIS — D751 Secondary polycythemia: Secondary | ICD-10-CM | POA: Insufficient documentation

## 2014-08-26 DIAGNOSIS — M25561 Pain in right knee: Secondary | ICD-10-CM | POA: Insufficient documentation

## 2014-08-26 DIAGNOSIS — M961 Postlaminectomy syndrome, not elsewhere classified: Secondary | ICD-10-CM

## 2014-08-26 DIAGNOSIS — M48061 Spinal stenosis, lumbar region without neurogenic claudication: Secondary | ICD-10-CM

## 2014-08-26 NOTE — Telephone Encounter (Signed)
Pt.notified

## 2014-08-26 NOTE — Telephone Encounter (Signed)
Seth Bake, Will you please let patient know that his Chiropractor at [facility] has requested that our office order lumbar xrays for insurance reasons.  I've placed an order for this to be obtained downstairs, no appt needed but when he gets these xrays I'd recommend he ask for the images to be burned onto a CD so he can take them to his chiropractor.

## 2014-08-27 ENCOUNTER — Telehealth: Payer: Self-pay | Admitting: Family Medicine

## 2014-08-27 NOTE — Telephone Encounter (Signed)
Received fax for approval on Methocarbamol. Authorization # [id number] good from 04/23/2014 - 04/23/2015. - CF

## 2014-08-31 ENCOUNTER — Encounter: Payer: Self-pay | Admitting: Family Medicine

## 2014-08-31 ENCOUNTER — Ambulatory Visit (INDEPENDENT_AMBULATORY_CARE_PROVIDER_SITE_OTHER): Payer: Medicare Other | Admitting: Family Medicine

## 2014-08-31 ENCOUNTER — Ambulatory Visit (INDEPENDENT_AMBULATORY_CARE_PROVIDER_SITE_OTHER): Payer: Medicare Other

## 2014-08-31 VITALS — BP 153/84 | HR 86 | Temp 98.8°F | Wt 299.0 lb

## 2014-08-31 DIAGNOSIS — B9689 Other specified bacterial agents as the cause of diseases classified elsewhere: Secondary | ICD-10-CM

## 2014-08-31 DIAGNOSIS — J329 Chronic sinusitis, unspecified: Secondary | ICD-10-CM | POA: Diagnosis not present

## 2014-08-31 DIAGNOSIS — M545 Low back pain: Secondary | ICD-10-CM | POA: Diagnosis not present

## 2014-08-31 DIAGNOSIS — I2510 Atherosclerotic heart disease of native coronary artery without angina pectoris: Secondary | ICD-10-CM

## 2014-08-31 DIAGNOSIS — M961 Postlaminectomy syndrome, not elsewhere classified: Secondary | ICD-10-CM

## 2014-08-31 DIAGNOSIS — E785 Hyperlipidemia, unspecified: Secondary | ICD-10-CM

## 2014-08-31 DIAGNOSIS — A499 Bacterial infection, unspecified: Secondary | ICD-10-CM | POA: Diagnosis not present

## 2014-08-31 DIAGNOSIS — M48061 Spinal stenosis, lumbar region without neurogenic claudication: Secondary | ICD-10-CM

## 2014-08-31 DIAGNOSIS — M47816 Spondylosis without myelopathy or radiculopathy, lumbar region: Secondary | ICD-10-CM | POA: Diagnosis not present

## 2014-08-31 DIAGNOSIS — M4316 Spondylolisthesis, lumbar region: Secondary | ICD-10-CM | POA: Diagnosis not present

## 2014-08-31 MED ORDER — DOXYCYCLINE HYCLATE 100 MG PO TABS: ORAL_TABLET | ORAL | Status: AC

## 2014-08-31 NOTE — Progress Notes (Signed)
CC: Jeff Green is a 59 y.o. male is here for cough and congestion   Subjective: HPI:  Facial pressure in the cheeks, nonproductive cough which has been present for the past 1 or 2 weeks. Present on a daily basis and worsening on a daily basis now moderate to severe in severity. Nothing particularly makes it better or worse. Over the last 2-3 days he's had subjective fevers and chills and night sweats. Symptoms occur all hours of the day mildly interfering with sleep. No benefit from over-the-counter cough and cold medication.  Denies wheezing, shortness of breath, chest pain, headache, confusion, rash or dysphagia   Review Of Systems Outlined In HPI  Past Medical History  Diagnosis Date  . Hypertension   . High cholesterol   . Congestive heart failure (CHF)     Past Surgical History  Procedure Laterality Date  . Lamenectomy    . Left orchiectomy    . Hernia repair    . Ivc  2014   Family History  Problem Relation Age of Onset  . Thyroid cancer Mother   . Depression Mother   . High Cholesterol Father   . High blood pressure Father   . Stroke      Grandfather    History   Social History  . Marital Status: Married    Spouse Name: N/A  . Number of Children: N/A  . Years of Education: N/A   Occupational History  . Not on file.   Social History Main Topics  . Smoking status: Former Smoker    Quit date: 08/10/1999  . Smokeless tobacco: Not on file  . Alcohol Use: No  . Drug Use: No  . Sexual Activity:    Partners: Female   Other Topics Concern  . Not on file   Social History Narrative     Objective: BP 153/84 mmHg  Pulse 86  Temp(Src) 98.8 F (37.1 C) (Oral)  Wt 299 lb (135.626 kg)  SpO2 94%  General: Alert and Oriented, No Acute Distress HEENT: Pupils equal, round, reactive to light. Conjunctivae clear.  External ears unremarkable, canals clear with intact TMs with appropriate landmarks.  Middle ear appears open without effusion. Pink inferior  turbinates.  Moist mucous membranes, pharynx without inflammation nor lesions of the mild postnasal drip.  Neck supple without palpable lymphadenopathy nor abnormal masses. Lungs: Clear to auscultation bilaterally, no wheezing/ronchi/rales.  Comfortable work of breathing. Good air movement. Cardiac: Regular rate and rhythm. Normal S1/S2.  No murmurs, rubs, nor gallops.   Mental Status: No depression, anxiety, nor agitation. Skin: Warm and dry.  Assessment & Plan: Jeff Green was seen today for cough and congestion.  Diagnoses and all orders for this visit:  Hyperlipidemia  Bacterial sinusitis Orders: -     doxycycline (VIBRA-TABS) 100 MG tablet; One by mouth twice a day for ten days.   Bacterial sinusitis: Start doxycycline consider nasal saline washes Hyperlipidemia: He has not started pravastatin yet I advised him to wait until he feels back to 100% with respect to his upper respiratory illness before starting pravastatin.   Return if symptoms worsen or fail to improve.

## 2014-09-03 ENCOUNTER — Telehealth: Payer: Self-pay

## 2014-09-03 MED ORDER — LEVOFLOXACIN 500 MG PO TABS: 500.0000 mg | ORAL_TABLET | Freq: Every day | ORAL | Status: DC

## 2014-09-03 NOTE — Telephone Encounter (Signed)
Patient called and left a message on nurse line asking for a return call.   Returned Call: Left message asking patient to call back.  

## 2014-09-03 NOTE — Telephone Encounter (Signed)
Patient advised.

## 2014-09-03 NOTE — Telephone Encounter (Signed)
Anglea,  Can you also ask him to switch from doxycycline to a new Rx of levofloxacin ONLY if not better by tomrrow. Rx sent to [facility] in [place]

## 2014-09-03 NOTE — Telephone Encounter (Signed)
Jeff Green reports continued cough, runny nose (clear drainage), headaches, and sweats. He talks about how congested he is. I advised him to take Coricidin HBP OTC and use a netti pot.

## 2014-09-07 ENCOUNTER — Ambulatory Visit: Payer: Medicare Other | Admitting: Family Medicine

## 2014-09-08 ENCOUNTER — Ambulatory Visit: Payer: Medicare Other | Admitting: Cardiology

## 2014-09-14 ENCOUNTER — Telehealth: Payer: Self-pay | Admitting: Family Medicine

## 2014-09-14 DIAGNOSIS — M961 Postlaminectomy syndrome, not elsewhere classified: Secondary | ICD-10-CM

## 2014-09-14 NOTE — Telephone Encounter (Signed)
Patient contacted clinic stating he was advised if he could not get into pain management that Dr. Ileene Rubens would cover him on his pain Rx's until he could be seen. Patient is requesting a refill on his Percocet 10/325mg  QID and Robaxin 750mg  TID. Please advise if these refills are appropriate. There is no scheduled date at pain management currently, Pt states he is waiting for them to call him.

## 2014-09-15 ENCOUNTER — Telehealth: Payer: Self-pay | Admitting: Family Medicine

## 2014-09-15 MED ORDER — OXYCODONE-ACETAMINOPHEN 10-325 MG PO TABS: 1.0000 | ORAL_TABLET | Freq: Four times a day (QID) | ORAL | Status: DC | PRN

## 2014-09-15 MED ORDER — METHOCARBAMOL 750 MG PO TABS: 750.0000 mg | ORAL_TABLET | Freq: Three times a day (TID) | ORAL | Status: DC | PRN

## 2014-09-15 NOTE — Telephone Encounter (Signed)
Received records from previous pain clinic I called [facility] and left a message for Jeff Green I faxed the records over.  Jeff Green returned my call and stated she has been trying to get in touch with Jeff Green since 5/10 and he has not returned her calls. - CF

## 2014-09-15 NOTE — Telephone Encounter (Signed)
Left message for Pt that Rx is ready for pick up.

## 2014-09-15 NOTE — Telephone Encounter (Signed)
Kelsi, Yes, can you please let patient know that I offer refills on controlled medications like Percocet for up to three months pending the establishment with a pain management clinic. I was under the impression that he already had an appointment.  Since this is not the case can you please get in touch with the clinic he was referred to?  At one point they were waiting for outside records but since I thought he already had an appt these were never sent over.  I will place both his outside records and the Rx in the PA box if needed.   Please return these records to my office when you're done with them.

## 2014-09-15 NOTE — Telephone Encounter (Signed)
Records are being faxed to pain management now.

## 2014-10-08 DIAGNOSIS — M9905 Segmental and somatic dysfunction of pelvic region: Secondary | ICD-10-CM | POA: Diagnosis not present

## 2014-10-08 DIAGNOSIS — M5022 Other cervical disc displacement, mid-cervical region: Secondary | ICD-10-CM | POA: Diagnosis not present

## 2014-10-08 DIAGNOSIS — M9902 Segmental and somatic dysfunction of thoracic region: Secondary | ICD-10-CM | POA: Diagnosis not present

## 2014-10-08 DIAGNOSIS — M9901 Segmental and somatic dysfunction of cervical region: Secondary | ICD-10-CM | POA: Diagnosis not present

## 2014-10-08 DIAGNOSIS — M546 Pain in thoracic spine: Secondary | ICD-10-CM | POA: Diagnosis not present

## 2014-10-08 DIAGNOSIS — M9904 Segmental and somatic dysfunction of sacral region: Secondary | ICD-10-CM | POA: Diagnosis not present

## 2014-10-08 DIAGNOSIS — M5431 Sciatica, right side: Secondary | ICD-10-CM | POA: Diagnosis not present

## 2014-10-08 DIAGNOSIS — M25551 Pain in right hip: Secondary | ICD-10-CM | POA: Diagnosis not present

## 2014-10-11 DIAGNOSIS — M5022 Other cervical disc displacement, mid-cervical region: Secondary | ICD-10-CM | POA: Diagnosis not present

## 2014-10-11 DIAGNOSIS — M9907 Segmental and somatic dysfunction of upper extremity: Secondary | ICD-10-CM | POA: Diagnosis not present

## 2014-10-11 DIAGNOSIS — M546 Pain in thoracic spine: Secondary | ICD-10-CM | POA: Diagnosis not present

## 2014-10-11 DIAGNOSIS — M5413 Radiculopathy, cervicothoracic region: Secondary | ICD-10-CM | POA: Diagnosis not present

## 2014-10-11 DIAGNOSIS — M25511 Pain in right shoulder: Secondary | ICD-10-CM | POA: Diagnosis not present

## 2014-10-11 DIAGNOSIS — M9901 Segmental and somatic dysfunction of cervical region: Secondary | ICD-10-CM | POA: Diagnosis not present

## 2014-10-11 DIAGNOSIS — M9902 Segmental and somatic dysfunction of thoracic region: Secondary | ICD-10-CM | POA: Diagnosis not present

## 2014-10-11 DIAGNOSIS — G5601 Carpal tunnel syndrome, right upper limb: Secondary | ICD-10-CM | POA: Diagnosis not present

## 2014-10-13 ENCOUNTER — Telehealth: Payer: Self-pay | Admitting: *Deleted

## 2014-10-13 DIAGNOSIS — M9904 Segmental and somatic dysfunction of sacral region: Secondary | ICD-10-CM | POA: Diagnosis not present

## 2014-10-13 DIAGNOSIS — M5413 Radiculopathy, cervicothoracic region: Secondary | ICD-10-CM | POA: Diagnosis not present

## 2014-10-13 DIAGNOSIS — M546 Pain in thoracic spine: Secondary | ICD-10-CM | POA: Diagnosis not present

## 2014-10-13 DIAGNOSIS — M25551 Pain in right hip: Secondary | ICD-10-CM | POA: Diagnosis not present

## 2014-10-13 DIAGNOSIS — M961 Postlaminectomy syndrome, not elsewhere classified: Secondary | ICD-10-CM

## 2014-10-13 DIAGNOSIS — M9901 Segmental and somatic dysfunction of cervical region: Secondary | ICD-10-CM | POA: Diagnosis not present

## 2014-10-13 DIAGNOSIS — M9902 Segmental and somatic dysfunction of thoracic region: Secondary | ICD-10-CM | POA: Diagnosis not present

## 2014-10-13 DIAGNOSIS — M9907 Segmental and somatic dysfunction of upper extremity: Secondary | ICD-10-CM | POA: Diagnosis not present

## 2014-10-13 DIAGNOSIS — M9905 Segmental and somatic dysfunction of pelvic region: Secondary | ICD-10-CM | POA: Diagnosis not present

## 2014-10-13 DIAGNOSIS — G5601 Carpal tunnel syndrome, right upper limb: Secondary | ICD-10-CM | POA: Diagnosis not present

## 2014-10-13 DIAGNOSIS — M25511 Pain in right shoulder: Secondary | ICD-10-CM | POA: Diagnosis not present

## 2014-10-13 DIAGNOSIS — M5431 Sciatica, right side: Secondary | ICD-10-CM | POA: Diagnosis not present

## 2014-10-13 DIAGNOSIS — M5022 Other cervical disc displacement, mid-cervical region: Secondary | ICD-10-CM | POA: Diagnosis not present

## 2014-10-13 MED ORDER — OXYCODONE-ACETAMINOPHEN 10-325 MG PO TABS: 1.0000 | ORAL_TABLET | Freq: Four times a day (QID) | ORAL | Status: DC | PRN

## 2014-10-13 NOTE — Telephone Encounter (Signed)
Seth Bake, Rx placed in in-box ready for pickup/faxing. Dr. Jodene Nam office sent me a confirmation about this.

## 2014-10-13 NOTE — Telephone Encounter (Signed)
Pt left vm asking for a refill on his oxycodone.  He stated that he has an appt with Dr. Vira Blanco on the 29th at 2:30 but he will run out by then.  Please advise.

## 2014-10-13 NOTE — Telephone Encounter (Signed)
rx up front and pt notified via vm

## 2014-10-15 DIAGNOSIS — M5022 Other cervical disc displacement, mid-cervical region: Secondary | ICD-10-CM | POA: Diagnosis not present

## 2014-10-15 DIAGNOSIS — M546 Pain in thoracic spine: Secondary | ICD-10-CM | POA: Diagnosis not present

## 2014-10-15 DIAGNOSIS — M9905 Segmental and somatic dysfunction of pelvic region: Secondary | ICD-10-CM | POA: Diagnosis not present

## 2014-10-15 DIAGNOSIS — M5431 Sciatica, right side: Secondary | ICD-10-CM | POA: Diagnosis not present

## 2014-10-15 DIAGNOSIS — M9904 Segmental and somatic dysfunction of sacral region: Secondary | ICD-10-CM | POA: Diagnosis not present

## 2014-10-15 DIAGNOSIS — M25551 Pain in right hip: Secondary | ICD-10-CM | POA: Diagnosis not present

## 2014-10-15 DIAGNOSIS — M9901 Segmental and somatic dysfunction of cervical region: Secondary | ICD-10-CM | POA: Diagnosis not present

## 2014-10-15 DIAGNOSIS — M9902 Segmental and somatic dysfunction of thoracic region: Secondary | ICD-10-CM | POA: Diagnosis not present

## 2014-10-22 ENCOUNTER — Telehealth: Payer: Self-pay | Admitting: *Deleted

## 2014-10-22 DIAGNOSIS — M5022 Other cervical disc displacement, mid-cervical region: Secondary | ICD-10-CM | POA: Diagnosis not present

## 2014-10-22 DIAGNOSIS — M25551 Pain in right hip: Secondary | ICD-10-CM | POA: Diagnosis not present

## 2014-10-22 DIAGNOSIS — M9904 Segmental and somatic dysfunction of sacral region: Secondary | ICD-10-CM | POA: Diagnosis not present

## 2014-10-22 DIAGNOSIS — M5431 Sciatica, right side: Secondary | ICD-10-CM | POA: Diagnosis not present

## 2014-10-22 DIAGNOSIS — M9902 Segmental and somatic dysfunction of thoracic region: Secondary | ICD-10-CM | POA: Diagnosis not present

## 2014-10-22 DIAGNOSIS — M9901 Segmental and somatic dysfunction of cervical region: Secondary | ICD-10-CM | POA: Diagnosis not present

## 2014-10-22 DIAGNOSIS — M546 Pain in thoracic spine: Secondary | ICD-10-CM | POA: Diagnosis not present

## 2014-10-22 DIAGNOSIS — M9905 Segmental and somatic dysfunction of pelvic region: Secondary | ICD-10-CM | POA: Diagnosis not present

## 2014-10-22 DIAGNOSIS — M961 Postlaminectomy syndrome, not elsewhere classified: Secondary | ICD-10-CM

## 2014-10-22 MED ORDER — OXYCODONE-ACETAMINOPHEN 10-325 MG PO TABS: 1.0000 | ORAL_TABLET | Freq: Four times a day (QID) | ORAL | Status: DC | PRN

## 2014-10-22 NOTE — Telephone Encounter (Signed)
Pt walked in to the clinic to ask why he only received 42 pills of the oxy when he normally gets #120. Pt was to have an appt with Dr. Vira Blanco on June 29th. (see previous phone note) I spoke with patient and he didn't go to his appt on the 29th because he said something came up and he now has an appt on July 19th at 76. He wants more medication until then. Please advise

## 2014-10-22 NOTE — Telephone Encounter (Signed)
Jeff Green, Rx placed in in-box ready for pickup/faxing. Will you also please let him know that July 19th also happens to be exactly three months from the first Rx for percocet and that three months of refills is the longest I Rx these types of medications for all of my patients.  Just don't want to catch him off guard if he doesn't go to Dr. Jodene Nam office.

## 2014-10-22 NOTE — Telephone Encounter (Signed)
Pt notified and rx up front 

## 2014-10-27 DIAGNOSIS — M9901 Segmental and somatic dysfunction of cervical region: Secondary | ICD-10-CM | POA: Diagnosis not present

## 2014-10-27 DIAGNOSIS — M5431 Sciatica, right side: Secondary | ICD-10-CM | POA: Diagnosis not present

## 2014-10-27 DIAGNOSIS — M9905 Segmental and somatic dysfunction of pelvic region: Secondary | ICD-10-CM | POA: Diagnosis not present

## 2014-10-27 DIAGNOSIS — M9904 Segmental and somatic dysfunction of sacral region: Secondary | ICD-10-CM | POA: Diagnosis not present

## 2014-10-27 DIAGNOSIS — M25551 Pain in right hip: Secondary | ICD-10-CM | POA: Diagnosis not present

## 2014-10-27 DIAGNOSIS — M9902 Segmental and somatic dysfunction of thoracic region: Secondary | ICD-10-CM | POA: Diagnosis not present

## 2014-10-27 DIAGNOSIS — M546 Pain in thoracic spine: Secondary | ICD-10-CM | POA: Diagnosis not present

## 2014-10-27 DIAGNOSIS — M5022 Other cervical disc displacement, mid-cervical region: Secondary | ICD-10-CM | POA: Diagnosis not present

## 2014-10-29 DIAGNOSIS — M9904 Segmental and somatic dysfunction of sacral region: Secondary | ICD-10-CM | POA: Diagnosis not present

## 2014-10-29 DIAGNOSIS — M9901 Segmental and somatic dysfunction of cervical region: Secondary | ICD-10-CM | POA: Diagnosis not present

## 2014-10-29 DIAGNOSIS — M546 Pain in thoracic spine: Secondary | ICD-10-CM | POA: Diagnosis not present

## 2014-10-29 DIAGNOSIS — M25551 Pain in right hip: Secondary | ICD-10-CM | POA: Diagnosis not present

## 2014-10-29 DIAGNOSIS — M5022 Other cervical disc displacement, mid-cervical region: Secondary | ICD-10-CM | POA: Diagnosis not present

## 2014-10-29 DIAGNOSIS — M9902 Segmental and somatic dysfunction of thoracic region: Secondary | ICD-10-CM | POA: Diagnosis not present

## 2014-10-29 DIAGNOSIS — M9905 Segmental and somatic dysfunction of pelvic region: Secondary | ICD-10-CM | POA: Diagnosis not present

## 2014-10-29 DIAGNOSIS — M5431 Sciatica, right side: Secondary | ICD-10-CM | POA: Diagnosis not present

## 2014-11-02 DIAGNOSIS — M9905 Segmental and somatic dysfunction of pelvic region: Secondary | ICD-10-CM | POA: Diagnosis not present

## 2014-11-02 DIAGNOSIS — M9901 Segmental and somatic dysfunction of cervical region: Secondary | ICD-10-CM | POA: Diagnosis not present

## 2014-11-02 DIAGNOSIS — M5431 Sciatica, right side: Secondary | ICD-10-CM | POA: Diagnosis not present

## 2014-11-02 DIAGNOSIS — M25551 Pain in right hip: Secondary | ICD-10-CM | POA: Diagnosis not present

## 2014-11-02 DIAGNOSIS — M5022 Other cervical disc displacement, mid-cervical region: Secondary | ICD-10-CM | POA: Diagnosis not present

## 2014-11-02 DIAGNOSIS — M9904 Segmental and somatic dysfunction of sacral region: Secondary | ICD-10-CM | POA: Diagnosis not present

## 2014-11-02 DIAGNOSIS — M9902 Segmental and somatic dysfunction of thoracic region: Secondary | ICD-10-CM | POA: Diagnosis not present

## 2014-11-02 DIAGNOSIS — M546 Pain in thoracic spine: Secondary | ICD-10-CM | POA: Diagnosis not present

## 2014-11-03 ENCOUNTER — Ambulatory Visit (INDEPENDENT_AMBULATORY_CARE_PROVIDER_SITE_OTHER): Payer: Medicare Other | Admitting: Family Medicine

## 2014-11-03 VITALS — BP 130/77 | HR 70

## 2014-11-03 DIAGNOSIS — I2699 Other pulmonary embolism without acute cor pulmonale: Secondary | ICD-10-CM

## 2014-11-03 LAB — POCT INR: INR: 2.6

## 2014-11-03 NOTE — Progress Notes (Signed)
   Subjective:    Patient ID: Jeff Green, male    DOB: 1955/07/13, 59 y.o.   MRN: [id number] Pt walked into the office asking questions about his INR.  He thought that he would be contacted by our office to set up the one month f/u for coumadin.  We added him onto the schedule & his INR is 2.6.  Beatris Ship, CMA HPI    Review of Systems     Objective:   Physical Exam        Assessment & Plan:

## 2014-11-04 NOTE — Progress Notes (Signed)
Pt.notified

## 2014-11-09 DIAGNOSIS — G894 Chronic pain syndrome: Secondary | ICD-10-CM | POA: Diagnosis not present

## 2014-11-09 DIAGNOSIS — Z79899 Other long term (current) drug therapy: Secondary | ICD-10-CM | POA: Diagnosis not present

## 2014-11-09 DIAGNOSIS — M961 Postlaminectomy syndrome, not elsewhere classified: Secondary | ICD-10-CM | POA: Diagnosis not present

## 2014-11-09 DIAGNOSIS — M545 Low back pain: Secondary | ICD-10-CM | POA: Diagnosis not present

## 2014-11-16 ENCOUNTER — Encounter: Payer: Self-pay | Admitting: Family Medicine

## 2014-11-23 DIAGNOSIS — M5417 Radiculopathy, lumbosacral region: Secondary | ICD-10-CM | POA: Diagnosis not present

## 2014-11-24 ENCOUNTER — Encounter: Payer: Self-pay | Admitting: Family Medicine

## 2014-11-24 ENCOUNTER — Ambulatory Visit (INDEPENDENT_AMBULATORY_CARE_PROVIDER_SITE_OTHER): Payer: Medicare Other | Admitting: Family Medicine

## 2014-11-24 VITALS — BP 157/98 | HR 88 | Wt 309.0 lb

## 2014-11-24 DIAGNOSIS — E785 Hyperlipidemia, unspecified: Secondary | ICD-10-CM

## 2014-11-24 DIAGNOSIS — E291 Testicular hypofunction: Secondary | ICD-10-CM | POA: Diagnosis not present

## 2014-11-24 DIAGNOSIS — R7303 Prediabetes: Secondary | ICD-10-CM

## 2014-11-24 DIAGNOSIS — I10 Essential (primary) hypertension: Secondary | ICD-10-CM | POA: Diagnosis not present

## 2014-11-24 DIAGNOSIS — D751 Secondary polycythemia: Secondary | ICD-10-CM

## 2014-11-24 DIAGNOSIS — I2510 Atherosclerotic heart disease of native coronary artery without angina pectoris: Secondary | ICD-10-CM | POA: Diagnosis not present

## 2014-11-24 DIAGNOSIS — R7309 Other abnormal glucose: Secondary | ICD-10-CM | POA: Diagnosis not present

## 2014-11-24 DIAGNOSIS — Z7901 Long term (current) use of anticoagulants: Secondary | ICD-10-CM | POA: Diagnosis not present

## 2014-11-24 LAB — CBC
HEMATOCRIT: 48.9 % (ref 39.0–52.0)
HEMOGLOBIN: 16.9 g/dL (ref 13.0–17.0)
MCH: 30.2 pg (ref 26.0–34.0)
MCHC: 34.6 g/dL (ref 30.0–36.0)
MCV: 87.3 fL (ref 78.0–100.0)
Platelets: 140 10*3/uL — ABNORMAL LOW (ref 150–400)
RBC: 5.6 MIL/uL (ref 4.22–5.81)
RDW: 16.1 % — ABNORMAL HIGH (ref 11.5–15.5)
WBC: 6.4 10*3/uL (ref 4.0–10.5)

## 2014-11-24 LAB — LIPID PANEL
CHOL/HDL RATIO: 5.7 ratio — AB (ref <=5.0)
Cholesterol: 229 mg/dL — ABNORMAL HIGH (ref 125–200)
HDL: 40 mg/dL (ref >=40)
TRIGLYCERIDES: 576 mg/dL — AB (ref <150)

## 2014-11-24 LAB — HEMOGLOBIN A1C
HEMOGLOBIN A1C: 6.4 % — AB (ref <5.7)
Mean Plasma Glucose: 137 mg/dL — ABNORMAL HIGH (ref <117)

## 2014-11-24 MED ORDER — METHOCARBAMOL 750 MG PO TABS: 750.0000 mg | ORAL_TABLET | Freq: Three times a day (TID) | ORAL | Status: DC | PRN

## 2014-11-24 NOTE — Progress Notes (Signed)
CC: Jeff Green is a 59 y.o. male is here for f/u cholesrerol and needs to have testosterone checked   Subjective: HPI:  Follow hyperlipidemia: Taking pravastatin on a daily basis without right upper quadrant pain or myalgias. He is working out most days of the week at [employer]. Denies chest pain shortness of breath orthopnea nor peripheral edema  Follow-up hypogonadism: He is currently taking testosterone twice a week as prescribed. He denies any chest pain shortness of breath or limb claudication. Mood is described as happy. He tells me it's been more than 4 months since his hemoglobin level was checked.  Follow prediabetes: No polyuria polyphagia or polydipsia. blood sugars to report.  Follow-up essential hypertension: He is fasting today and was under the impression he should not take metoprolol. Blood pressures are normotensive at home. No chest pain shortness of breath orthopnea nor peripheral edema. Denies motor or sensory disturbances     Review Of Systems Outlined In HPI  Past Medical History  Diagnosis Date  . Hypertension   . High cholesterol   . Congestive heart failure (CHF)     Past Surgical History  Procedure Laterality Date  . Lamenectomy    . Left orchiectomy    . Hernia repair    . Ivc  2014   Family History  Problem Relation Age of Onset  . Thyroid cancer Mother   . Depression Mother   . High Cholesterol Father   . High blood pressure Father   . Stroke      Grandfather    History   Social History  . Marital Status: Married    Spouse Name: N/A  . Number of Children: N/A  . Years of Education: N/A   Occupational History  . Not on file.   Social History Main Topics  . Smoking status: Former Smoker    Quit date: 08/10/1999  . Smokeless tobacco: Not on file  . Alcohol Use: No  . Drug Use: No  . Sexual Activity:    Partners: Female   Other Topics Concern  . Not on file   Social History Narrative     Objective: BP 157/98 mmHg   Pulse 88  Wt 309 lb (140.161 kg)  General: Alert and Oriented, No Acute Distress HEENT: Pupils equal, round, reactive to light. Conjunctivae clear.  Moist mucous membranes Lungs: Clear to auscultation bilaterally, no wheezing/ronchi/rales.  Comfortable work of breathing. Good air movement. Cardiac: Regular rate and rhythm. Normal S1/S2.  No murmurs, rubs, nor gallops.   Abdomen: Obese and soft Extremities: No peripheral edema.  Strong peripheral pulses.  Mental Status: No depression, anxiety, nor agitation. Skin: Warm and dry.  Assessment & Plan: Nickolus was seen today for f/u cholesrerol and needs to have testosterone checked.  Diagnoses and all orders for this visit:  Hyperlipidemia Orders: -     Lipid panel  Hypogonadism in male  Prediabetes Orders: -     Hemoglobin A1c  Essential hypertension  Polycythemia, secondary Orders: -     CBC  Long-term (current) use of anticoagulants Orders: -     INR/PT  Other orders -     methocarbamol (ROBAXIN) 750 MG tablet; Take 1 tablet (750 mg total) by mouth every 8 (eight) hours as needed for muscle spasms.   Hyperlipidemia: Due for repeat lipid panel continue pravastatin pending results Hypogonadism and polycythemia from supplemental testosterone: Clinically controlled but due for hemoglobin and hematocrit to determine if he needs to donate blood. Essential hypertension: Uncontrolled chronic condition  continue to take metoprolol, counseled that he does not need to withhold this when fasting. History of recurrent blood clots: Due for INR check today. Prediabetes: Clinically controlled however due for A1c.    Return in about 3 months (around 02/24/2015).

## 2014-11-25 LAB — PROTIME-INR
INR: 2.86 — AB (ref <1.50)
Prothrombin Time: 30 seconds — ABNORMAL HIGH (ref 11.6–15.2)

## 2014-12-06 DIAGNOSIS — M545 Low back pain: Secondary | ICD-10-CM | POA: Diagnosis not present

## 2014-12-06 DIAGNOSIS — G894 Chronic pain syndrome: Secondary | ICD-10-CM | POA: Diagnosis not present

## 2014-12-06 DIAGNOSIS — M961 Postlaminectomy syndrome, not elsewhere classified: Secondary | ICD-10-CM | POA: Diagnosis not present

## 2014-12-06 DIAGNOSIS — Z79899 Other long term (current) drug therapy: Secondary | ICD-10-CM | POA: Diagnosis not present

## 2014-12-10 DIAGNOSIS — G5601 Carpal tunnel syndrome, right upper limb: Secondary | ICD-10-CM | POA: Diagnosis not present

## 2014-12-10 DIAGNOSIS — M5413 Radiculopathy, cervicothoracic region: Secondary | ICD-10-CM | POA: Diagnosis not present

## 2014-12-10 DIAGNOSIS — M25511 Pain in right shoulder: Secondary | ICD-10-CM | POA: Diagnosis not present

## 2014-12-10 DIAGNOSIS — M5022 Other cervical disc displacement, mid-cervical region: Secondary | ICD-10-CM | POA: Diagnosis not present

## 2014-12-10 DIAGNOSIS — M546 Pain in thoracic spine: Secondary | ICD-10-CM | POA: Diagnosis not present

## 2014-12-10 DIAGNOSIS — M9902 Segmental and somatic dysfunction of thoracic region: Secondary | ICD-10-CM | POA: Diagnosis not present

## 2014-12-10 DIAGNOSIS — M9901 Segmental and somatic dysfunction of cervical region: Secondary | ICD-10-CM | POA: Diagnosis not present

## 2014-12-10 DIAGNOSIS — M9907 Segmental and somatic dysfunction of upper extremity: Secondary | ICD-10-CM | POA: Diagnosis not present

## 2015-01-04 DIAGNOSIS — M545 Low back pain: Secondary | ICD-10-CM | POA: Diagnosis not present

## 2015-01-04 DIAGNOSIS — M961 Postlaminectomy syndrome, not elsewhere classified: Secondary | ICD-10-CM | POA: Diagnosis not present

## 2015-01-27 ENCOUNTER — Other Ambulatory Visit: Payer: Self-pay

## 2015-01-27 MED ORDER — WARFARIN SODIUM 10 MG PO TABS: ORAL_TABLET | ORAL | Status: DC

## 2015-01-27 MED ORDER — AMLODIPINE BESYLATE 10 MG PO TABS: 10.0000 mg | ORAL_TABLET | Freq: Every day | ORAL | Status: DC

## 2015-01-27 MED ORDER — WARFARIN SODIUM 5 MG PO TABS: ORAL_TABLET | ORAL | Status: DC

## 2015-01-27 MED ORDER — WARFARIN SODIUM 4 MG PO TABS: ORAL_TABLET | ORAL | Status: DC

## 2015-01-27 NOTE — Telephone Encounter (Signed)
Patient needs a refill on amlodipine 10 mg and coumadin 10 mg, 5 mg and 4 mg. Historical provider.

## 2015-01-27 NOTE — Telephone Encounter (Signed)
Rx sent 

## 2015-01-28 ENCOUNTER — Ambulatory Visit (INDEPENDENT_AMBULATORY_CARE_PROVIDER_SITE_OTHER): Payer: Medicare Other | Admitting: Family Medicine

## 2015-01-28 VITALS — BP 153/88 | HR 63

## 2015-01-28 DIAGNOSIS — Z7901 Long term (current) use of anticoagulants: Secondary | ICD-10-CM | POA: Diagnosis not present

## 2015-01-28 LAB — POCT INR: INR: 2.1

## 2015-01-28 NOTE — Progress Notes (Signed)
   Subjective:    Patient ID: Jeff Green, male    DOB: May 04, 1955, 59 y.o.   MRN: [id number]  HPI  Jeff Green is here for a coumadin check.  Denies changes in diet, has not skipped any medication doses.  Review of Systems     Objective:   Physical Exam        Assessment & Plan:   Jeff Green tolerated finger puncture well without any complications.

## 2015-01-28 NOTE — Progress Notes (Signed)
INR today 2.1 at goal. No change in plan.

## 2015-02-01 DIAGNOSIS — M961 Postlaminectomy syndrome, not elsewhere classified: Secondary | ICD-10-CM | POA: Diagnosis not present

## 2015-02-14 ENCOUNTER — Other Ambulatory Visit: Payer: Self-pay | Admitting: Family Medicine

## 2015-02-25 ENCOUNTER — Ambulatory Visit (INDEPENDENT_AMBULATORY_CARE_PROVIDER_SITE_OTHER): Payer: Medicare Other | Admitting: Family Medicine

## 2015-02-25 VITALS — BP 137/79 | HR 71

## 2015-02-25 DIAGNOSIS — Z7901 Long term (current) use of anticoagulants: Secondary | ICD-10-CM | POA: Diagnosis not present

## 2015-02-25 DIAGNOSIS — I2699 Other pulmonary embolism without acute cor pulmonale: Secondary | ICD-10-CM

## 2015-02-25 LAB — POCT INR: INR: 1.7

## 2015-02-25 NOTE — Progress Notes (Signed)
INR is below goal, begin taking 10mg  every day of the week and return in one week for a repeat INR check.

## 2015-02-25 NOTE — Progress Notes (Signed)
Patient advised.

## 2015-02-25 NOTE — Progress Notes (Signed)
Left detailed message advising of results. 

## 2015-03-02 ENCOUNTER — Ambulatory Visit (INDEPENDENT_AMBULATORY_CARE_PROVIDER_SITE_OTHER): Payer: Medicare Other | Admitting: Family Medicine

## 2015-03-02 ENCOUNTER — Encounter: Payer: Self-pay | Admitting: Family Medicine

## 2015-03-02 VITALS — BP 153/93 | HR 78 | Wt 313.0 lb

## 2015-03-02 DIAGNOSIS — Z131 Encounter for screening for diabetes mellitus: Secondary | ICD-10-CM | POA: Diagnosis not present

## 2015-03-02 DIAGNOSIS — I2510 Atherosclerotic heart disease of native coronary artery without angina pectoris: Secondary | ICD-10-CM

## 2015-03-02 DIAGNOSIS — D751 Secondary polycythemia: Secondary | ICD-10-CM | POA: Diagnosis not present

## 2015-03-02 DIAGNOSIS — E669 Obesity, unspecified: Secondary | ICD-10-CM

## 2015-03-02 DIAGNOSIS — I10 Essential (primary) hypertension: Secondary | ICD-10-CM

## 2015-03-02 DIAGNOSIS — I2699 Other pulmonary embolism without acute cor pulmonale: Secondary | ICD-10-CM

## 2015-03-02 DIAGNOSIS — R7303 Prediabetes: Secondary | ICD-10-CM | POA: Diagnosis not present

## 2015-03-02 LAB — POCT GLYCOSYLATED HEMOGLOBIN (HGB A1C): Hemoglobin A1C: 5.7

## 2015-03-02 LAB — POCT INR: INR: 2.6

## 2015-03-02 MED ORDER — MELOXICAM 15 MG PO TABS: 15.0000 mg | ORAL_TABLET | Freq: Every day | ORAL | Status: DC

## 2015-03-02 MED ORDER — METHOCARBAMOL 750 MG PO TABS: 750.0000 mg | ORAL_TABLET | Freq: Three times a day (TID) | ORAL | Status: DC | PRN

## 2015-03-02 MED ORDER — PHENTERMINE HCL 37.5 MG PO TABS: 37.5000 mg | ORAL_TABLET | Freq: Every day | ORAL | Status: DC

## 2015-03-02 NOTE — Progress Notes (Signed)
CC: Jeff Green is a 59 y.o. male is here for Hyperglycemia   Subjective: HPI:  Follow-up essential hypertension: Believed that he needed to be fasting today and did not take any blood pressure medication otherwise taking metoprolol and amlodipine on a daily basis. He occasionally checks his blood pressure at home and it's normal. Denies chest pain shortness of breath orthopnea or peripheral edema.  Follow-up prediabetes: No outside blood sugars to report. He's been going to the gym most days of the week however still cannot lose weight. No polyuria or polyphagia polydipsia or vision loss.  History of recurrent pulmonary emboli currently on chronic Coumadin therapy taking 10 mg of Coumadin daily. He denies bruising or bleeding abnormalities. Denies any new shortness of breath or limb swelling  Continues to take testosterone supplementations and wants to know if his blood can be checked for polycythemia. He denies any headaches, motor or sensory disturbances or chest pain.   Review Of Systems Outlined In HPI  Past Medical History  Diagnosis Date  . Hypertension   . High cholesterol   . Congestive heart failure (CHF) [facility])     Past Surgical History  Procedure Laterality Date  . Lamenectomy    . Left orchiectomy    . Hernia repair    . Ivc  2014   Family History  Problem Relation Age of Onset  . Thyroid cancer Mother   . Depression Mother   . High Cholesterol Father   . High blood pressure Father   . Stroke      Grandfather    Social History   Social History  . Marital Status: Married    Spouse Name: N/A  . Number of Children: N/A  . Years of Education: N/A   Occupational History  . Not on file.   Social History Main Topics  . Smoking status: Former Smoker    Quit date: 08/10/1999  . Smokeless tobacco: Not on file  . Alcohol Use: No  . Drug Use: No  . Sexual Activity:    Partners: Female   Other Topics Concern  . Not on file   Social History Narrative      Objective: BP 153/93 mmHg  Pulse 78  Wt 313 lb (141.976 kg)  General: Alert and Oriented, No Acute Distress HEENT: Pupils equal, round, reactive to light. Conjunctivae clear.  Moistness members. Lungs: Clear to auscultation bilaterally, no wheezing/ronchi/rales.  Comfortable work of breathing. Good air movement. Cardiac: Regular rate and rhythm. Normal S1/S2.  No murmurs, rubs, nor gallops.   Abdomen: mild obesity and soft Extremities: No peripheral edema.  Strong peripheral pulses.  Mental Status: No depression, anxiety, nor agitation. Skin: Warm and dry.  Assessment & Plan: Jeff Green was seen today for hyperglycemia.  Diagnoses and all orders for this visit:  Essential hypertension  Prediabetes -     POCT HgB A1C  Recurrent pulmonary emboli (HCC) -     POCT INR  Polycythemia, secondary -     Hemoglobin  Obesity -     phentermine (ADIPEX-P) 37.5 MG tablet; Take 1 tablet (37.5 mg total) by mouth daily before breakfast.  Diabetes mellitus screening -     POCT HgB A1C  Other orders -     meloxicam (MOBIC) 15 MG tablet; Take 1 tablet (15 mg total) by mouth daily. -     methocarbamol (ROBAXIN) 750 MG tablet; Take 1 tablet (750 mg total) by mouth every 8 (eight) hours as needed for muscle spasms.   Essential hypertension:  Uncontrolled encouraged to take blood pressure after restarting amlodipine and metoprolol every morning and follow-up as soon as possible values reach above 140/90 Pre-diabetes: A1c of 5.7, controlled, continue diet and weight loss endeavors Recall her pulmonary emboli: Continue 10 mg of Coumadin daily with a therapeutic INR of 2.6  polycythemia: A symptomatic butChecking hemoglobin level given testosterone use Obesity: Offered phentermine as long as he can promise that he is going to start back on amlodipine and metoprolol   Return in about 1 week (around 03/09/2015) for INR Check.

## 2015-03-08 DIAGNOSIS — M545 Low back pain: Secondary | ICD-10-CM | POA: Diagnosis not present

## 2015-03-08 DIAGNOSIS — M961 Postlaminectomy syndrome, not elsewhere classified: Secondary | ICD-10-CM | POA: Diagnosis not present

## 2015-03-08 DIAGNOSIS — G894 Chronic pain syndrome: Secondary | ICD-10-CM | POA: Diagnosis not present

## 2015-03-08 DIAGNOSIS — Z79899 Other long term (current) drug therapy: Secondary | ICD-10-CM | POA: Diagnosis not present

## 2015-03-09 ENCOUNTER — Ambulatory Visit (INDEPENDENT_AMBULATORY_CARE_PROVIDER_SITE_OTHER): Payer: Medicare Other | Admitting: Family Medicine

## 2015-03-09 VITALS — BP 144/83 | HR 89

## 2015-03-09 DIAGNOSIS — Z7901 Long term (current) use of anticoagulants: Secondary | ICD-10-CM

## 2015-03-09 DIAGNOSIS — I2699 Other pulmonary embolism without acute cor pulmonale: Secondary | ICD-10-CM

## 2015-03-09 LAB — POCT INR: INR: 2.5

## 2015-03-09 NOTE — Progress Notes (Signed)
INR is perfect, continue taking coumadin 10mg  every day of the week and return in one month for a repeat INR check.

## 2015-03-28 ENCOUNTER — Other Ambulatory Visit: Payer: Self-pay | Admitting: Family Medicine

## 2015-03-30 ENCOUNTER — Ambulatory Visit (INDEPENDENT_AMBULATORY_CARE_PROVIDER_SITE_OTHER): Payer: Medicare Other | Admitting: Family Medicine

## 2015-03-30 VITALS — BP 142/78 | HR 76 | Wt 308.0 lb

## 2015-03-30 DIAGNOSIS — E669 Obesity, unspecified: Secondary | ICD-10-CM | POA: Diagnosis not present

## 2015-03-30 DIAGNOSIS — I2510 Atherosclerotic heart disease of native coronary artery without angina pectoris: Secondary | ICD-10-CM | POA: Diagnosis not present

## 2015-03-30 DIAGNOSIS — R635 Abnormal weight gain: Secondary | ICD-10-CM

## 2015-03-30 MED ORDER — PHENTERMINE HCL 37.5 MG PO TABS: 37.5000 mg | ORAL_TABLET | Freq: Every day | ORAL | Status: DC

## 2015-03-30 NOTE — Progress Notes (Signed)
   Subjective:    Patient ID: Jeff Green, male    DOB: Mar 03, 1956, 59 y.o.   MRN: [id number]  HPI  Huey Nathe is here for blood pressure and weight check. Diet and exercise is going well. Denies trouble sleeping or palpitations.   Review of Systems     Objective:   Physical Exam        Assessment & Plan:  Abnormal weight gain - Patient has lost weight. A refill of phentermine will be sent to [facility].

## 2015-03-30 NOTE — Progress Notes (Signed)
Weight loss success, refill provided 

## 2015-04-04 DIAGNOSIS — M545 Low back pain: Secondary | ICD-10-CM | POA: Diagnosis not present

## 2015-04-04 DIAGNOSIS — G894 Chronic pain syndrome: Secondary | ICD-10-CM | POA: Diagnosis not present

## 2015-04-04 DIAGNOSIS — M961 Postlaminectomy syndrome, not elsewhere classified: Secondary | ICD-10-CM | POA: Diagnosis not present

## 2015-04-04 DIAGNOSIS — M79606 Pain in leg, unspecified: Secondary | ICD-10-CM | POA: Diagnosis not present

## 2015-04-14 ENCOUNTER — Other Ambulatory Visit: Payer: Self-pay | Admitting: Family Medicine

## 2015-04-29 ENCOUNTER — Encounter: Payer: Self-pay | Admitting: Family Medicine

## 2015-04-29 ENCOUNTER — Ambulatory Visit (INDEPENDENT_AMBULATORY_CARE_PROVIDER_SITE_OTHER): Payer: Medicare Other | Admitting: Family Medicine

## 2015-04-29 VITALS — BP 154/91 | HR 81 | Temp 98.5°F | Ht 74.0 in | Wt 312.0 lb

## 2015-04-29 DIAGNOSIS — D751 Secondary polycythemia: Secondary | ICD-10-CM | POA: Diagnosis not present

## 2015-04-29 DIAGNOSIS — J209 Acute bronchitis, unspecified: Secondary | ICD-10-CM | POA: Diagnosis not present

## 2015-04-29 LAB — HEMOGLOBIN: HEMOGLOBIN: 17.4 g/dL — AB (ref 13.0–17.0)

## 2015-04-29 MED ORDER — GUAIFENESIN-CODEINE 100-10 MG/5ML PO SOLN: 5.0000 mL | Freq: Every evening | ORAL | Status: DC | PRN

## 2015-04-29 MED ORDER — PREDNISONE 10 MG PO TABS: 30.0000 mg | ORAL_TABLET | Freq: Every day | ORAL | Status: DC

## 2015-04-29 MED ORDER — IPRATROPIUM BROMIDE 0.06 % NA SOLN: 2.0000 | Freq: Four times a day (QID) | NASAL | Status: DC

## 2015-04-29 NOTE — Progress Notes (Signed)
Jeff Green is a 60 y.o. male who presents to [place] today for cough congestion sore throat and sinus pressure. Symptoms present for about 2 weeks worse for about one week. No fevers chills nausea vomiting or diarrhea. No significant trouble breathing. Patient has tried some over-the-counter medicines which have helped.   Past Medical History  Diagnosis Date  . Hypertension   . High cholesterol   . Congestive heart failure (CHF) [facility])    Past Surgical History  Procedure Laterality Date  . Lamenectomy    . Left orchiectomy    . Hernia repair    . Ivc  2014   Social History  Substance Use Topics  . Smoking status: Former Smoker    Quit date: 08/10/1999  . Smokeless tobacco: Not on file  . Alcohol Use: No   family history includes Depression in his mother; High Cholesterol in his father; High blood pressure in his father; Thyroid cancer in his mother.  ROS as above Medications: Current Outpatient Prescriptions  Medication Sig Dispense Refill  . AMBULATORY NON FORMULARY MEDICATION Take 500 mg by mouth. Tumeric    . amLODipine (NORVASC) 10 MG tablet Take 1 tablet (10 mg total) by mouth daily. 30 tablet 5  . Biotin 5000 MCG CAPS Take by mouth.    . Cholecalciferol (VITAMIN D3) 5000 UNITS TABS Take by mouth 2 (two) times daily between meals.    . Coenzyme Q10 (COQ-10 PO) Take by mouth.    . Cyanocobalamin (B-12 SL) Place under the tongue.    Marland Kitchen GLUCOSAMINE CHONDROITIN COMPLX PO Take by mouth. Pt takes 1500-1288mg  per pt.    . meloxicam (MOBIC) 15 MG tablet Take 1 tablet (15 mg total) by mouth daily. 30 tablet 5  . methocarbamol (ROBAXIN) 750 MG tablet Take 1 tablet (750 mg total) by mouth every 8 (eight) hours as needed for muscle spasms. 90 tablet 5  . metoprolol succinate (TOPROL-XL) 50 MG 24 hr tablet TAKE ONE TABLET BY MOUTH ONCE DAILY WITH  OR  IMMEDIATELY   FOLLOWING  A  MEAL 90 tablet 0  . Multiple Vitamin (MULTIVITAMIN) capsule Take 1 capsule by mouth daily.    Marland Kitchen oxyCODONE-acetaminophen (PERCOCET) 10-325 MG per tablet Take 1 tablet by mouth every 6 (six) hours as needed for pain. 76 tablet 0  . phentermine (ADIPEX-P) 37.5 MG tablet Take 1 tablet (37.5 mg total) by mouth daily before breakfast. 30 tablet 0  . pravastatin (PRAVACHOL) 40 MG tablet Take 1 tablet (40 mg total) by mouth daily. 90 tablet 3  . Probiotic Product (PROBIOTIC DAILY PO) Take by mouth.    . testosterone cypionate (DEPOTESTOTERONE CYPIONATE) 200 MG/ML injection 0.72mL IM every other week. 10 mL   . warfarin (COUMADIN) 10 MG tablet TAKE ONE TABLET BY MOUTH ONCE DAILY BASED  ON  MOST  RECENT  INR 30 tablet 0  . warfarin (COUMADIN) 4 MG tablet TAKE ONE TABLET BY MOUTH ONCE DAILY BASED  ONMOST  RECENT  INR 30 tablet 0  . warfarin (COUMADIN) 5 MG tablet One by mouth daily based on most recent INR 30 tablet 0  . guaiFENesin-codeine 100-10 MG/5ML syrup Take 5 mLs by mouth at bedtime as needed for cough. 120 mL 0  . ipratropium (ATROVENT) 0.06 % nasal spray Place 2 sprays into both nostrils 4 (four) times daily. 15 mL 1  . predniSONE (DELTASONE) 10 MG tablet Take 3 tablets (30 mg total) by mouth daily. 15  tablet 0   No current facility-administered medications for this visit.   Allergies  Allergen Reactions  . Lipitor [Atorvastatin]     myalgias     Exam:  BP 154/91 mmHg  Pulse 81  Temp(Src) 98.5 F (36.9 C) (Oral)  Ht 6\' 2"  (1.88 m)  Wt 312 lb (141.522 kg)  BMI 40.04 kg/m2  SpO2 94% Gen: Well NAD HEENT: EOMI,  MMM clear nasal discharge. Posterior pharynx cobblestoning. Normal tympanic membranes bilaterally. Lungs: Normal work of breathing. CTABL Heart: RRR no MRG Abd: NABS, Soft. Nondistended, Nontender Exts: Brisk capillary refill, warm and well perfused.   No results found for this or any previous visit (from the past 24 hour(s)). No results found.   Please see  individual assessment and plan sections.

## 2015-04-29 NOTE — Patient Instructions (Signed)
Thank you for coming in today. Call or go to the emergency room if you get worse, have trouble breathing, have chest pains, or palpitations.  Take the medicine as directed.  Do not drive after taking codeine.  Return if worse.  Get xray today.    Acute Bronchitis Bronchitis is inflammation of the airways that extend from the windpipe into the lungs (bronchi). The inflammation often causes mucus to develop. This leads to a cough, which is the most common symptom of bronchitis.  In acute bronchitis, the condition usually develops suddenly and goes away over time, usually in a couple weeks. Smoking, allergies, and asthma can make bronchitis worse. Repeated episodes of bronchitis may cause further lung problems.  CAUSES Acute bronchitis is most often caused by the same virus that causes a cold. The virus can spread from person to person (contagious) through coughing, sneezing, and touching contaminated objects. SIGNS AND SYMPTOMS   Cough.   Fever.   Coughing up mucus.   Body aches.   Chest congestion.   Chills.   Shortness of breath.   Sore throat.  DIAGNOSIS  Acute bronchitis is usually diagnosed through a physical exam. Your health care provider will also ask you questions about your medical history. Tests, such as chest X-rays, are sometimes done to rule out other conditions.  TREATMENT  Acute bronchitis usually goes away in a couple weeks. Oftentimes, no medical treatment is necessary. Medicines are sometimes given for relief of fever or cough. Antibiotic medicines are usually not needed but may be prescribed in certain situations. In some cases, an inhaler may be recommended to help reduce shortness of breath and control the cough. A cool mist vaporizer may also be used to help thin bronchial secretions and make it easier to clear the chest.  HOME CARE INSTRUCTIONS  Get plenty of rest.   Drink enough fluids to keep your urine clear or pale yellow (unless you have a  medical condition that requires fluid restriction). Increasing fluids may help thin your respiratory secretions (sputum) and reduce chest congestion, and it will prevent dehydration.   Take medicines only as directed by your health care provider.  If you were prescribed an antibiotic medicine, finish it all even if you start to feel better.  Avoid smoking and secondhand smoke. Exposure to cigarette smoke or irritating chemicals will make bronchitis worse. If you are a smoker, consider using nicotine gum or skin patches to help control withdrawal symptoms. Quitting smoking will help your lungs heal faster.   Reduce the chances of another bout of acute bronchitis by washing your hands frequently, avoiding people with cold symptoms, and trying not to touch your hands to your mouth, nose, or eyes.   Keep all follow-up visits as directed by your health care provider.  SEEK MEDICAL CARE IF: Your symptoms do not improve after 1 week of treatment.  SEEK IMMEDIATE MEDICAL CARE IF:  You develop an increased fever or chills.   You have chest pain.   You have severe shortness of breath.  You have bloody sputum.   You develop dehydration.  You faint or repeatedly feel like you are going to pass out.  You develop repeated vomiting.  You develop a severe headache. MAKE SURE YOU:   Understand these instructions.  Will watch your condition.  Will get help right away if you are not doing well or get worse.   This information is not intended to replace advice given to you by your health care provider. Make  sure you discuss any questions you have with your health care provider.   Document Released: 05/17/2004 Document Revised: 04/30/2014 Document Reviewed: 09/30/2012 Elsevier Interactive Patient Education [employer].

## 2015-04-29 NOTE — Assessment & Plan Note (Addendum)
X-ray pending. Treat with prednisone and Atrovent nasal spray and codeine cough syrup. Recommend discontinuation of phentermine or follow-up with PCP based on elevated blood pressure.

## 2015-05-02 ENCOUNTER — Ambulatory Visit: Payer: Medicare Other

## 2015-05-06 ENCOUNTER — Encounter: Payer: Self-pay | Admitting: Physician Assistant

## 2015-05-06 ENCOUNTER — Ambulatory Visit (INDEPENDENT_AMBULATORY_CARE_PROVIDER_SITE_OTHER): Payer: Medicare Other | Admitting: Physician Assistant

## 2015-05-06 VITALS — BP 138/88 | HR 78 | Ht 74.0 in | Wt 307.0 lb

## 2015-05-06 DIAGNOSIS — B9689 Other specified bacterial agents as the cause of diseases classified elsewhere: Secondary | ICD-10-CM

## 2015-05-06 DIAGNOSIS — R059 Cough, unspecified: Secondary | ICD-10-CM

## 2015-05-06 DIAGNOSIS — J329 Chronic sinusitis, unspecified: Secondary | ICD-10-CM | POA: Diagnosis not present

## 2015-05-06 DIAGNOSIS — A499 Bacterial infection, unspecified: Secondary | ICD-10-CM

## 2015-05-06 DIAGNOSIS — R05 Cough: Secondary | ICD-10-CM | POA: Diagnosis not present

## 2015-05-06 MED ORDER — HYDROCOD POLST-CPM POLST ER 10-8 MG/5ML PO SUER: 5.0000 mL | Freq: Two times a day (BID) | ORAL | Status: DC | PRN

## 2015-05-06 MED ORDER — AMOXICILLIN-POT CLAVULANATE 875-125 MG PO TABS: 1.0000 | ORAL_TABLET | Freq: Two times a day (BID) | ORAL | Status: DC

## 2015-05-06 NOTE — Progress Notes (Signed)
   Subjective:    Patient ID: Jeff Green, male    DOB: 10/10/1955, 60 y.o.   MRN: [id number]  HPI  Pt is a 60 yo male who presents to the clinic with sinus pressure and cough for over 2 weeks. He was seen on 04/29/2015 for acute bronchitis and given prednisone, cough syrup, and atrovent nasal spray. He felt a little better and then began to feel worse. Cough has improved except for at night. No SOB or wheezing at rest. Does feel SOB when walking up stairs. Headache is getting worse.   Review of Systems  All other systems reviewed and are negative.      Objective:   Physical Exam  Constitutional: He is oriented to person, place, and time. He appears well-developed and well-nourished.  HENT:  Head: Normocephalic and atraumatic.  Right Ear: External ear normal.  Left Ear: External ear normal.  Mouth/Throat: Oropharynx is clear and moist. No oropharyngeal exudate.  TM's erythematous and slightly dull. +light reflex.  Tenderness over frontal sinuses to palpation.  Oropharynx erythematous with mild tonsilar swelling.  Bilateral nasal turbinates red and swollen.   Eyes: Conjunctivae are normal. Right eye exhibits no discharge. Left eye exhibits no discharge.  Neck: Normal range of motion. Neck supple.  Bilateral lymph node tenderness and enlargement.   Cardiovascular: Normal rate, regular rhythm and normal heart sounds.   Pulmonary/Chest: Effort normal and breath sounds normal. He has no wheezes.  Lymphadenopathy:    He has cervical adenopathy.  Neurological: He is alert and oriented to person, place, and time.  Psychiatric: He has a normal mood and affect. His behavior is normal.          Assessment & Plan:  Bacterial sinusitis/cough- treated with augmentin for 10 days. tussinonex for cough. Rest and hydrate. Continue mucinex as needed. Follow up if not improving.

## 2015-05-06 NOTE — Patient Instructions (Signed)

## 2015-05-16 ENCOUNTER — Other Ambulatory Visit: Payer: Self-pay | Admitting: Family Medicine

## 2015-05-30 DIAGNOSIS — M545 Low back pain: Secondary | ICD-10-CM | POA: Diagnosis not present

## 2015-05-30 DIAGNOSIS — G894 Chronic pain syndrome: Secondary | ICD-10-CM | POA: Diagnosis not present

## 2015-05-30 DIAGNOSIS — Z79899 Other long term (current) drug therapy: Secondary | ICD-10-CM | POA: Diagnosis not present

## 2015-05-30 DIAGNOSIS — M961 Postlaminectomy syndrome, not elsewhere classified: Secondary | ICD-10-CM | POA: Diagnosis not present

## 2015-05-30 DIAGNOSIS — M79606 Pain in leg, unspecified: Secondary | ICD-10-CM | POA: Diagnosis not present

## 2015-06-04 DIAGNOSIS — D72829 Elevated white blood cell count, unspecified: Secondary | ICD-10-CM | POA: Diagnosis present

## 2015-06-04 DIAGNOSIS — R0602 Shortness of breath: Secondary | ICD-10-CM | POA: Diagnosis not present

## 2015-06-04 DIAGNOSIS — Z6841 Body Mass Index (BMI) 40.0 and over, adult: Secondary | ICD-10-CM | POA: Diagnosis not present

## 2015-06-04 DIAGNOSIS — R079 Chest pain, unspecified: Secondary | ICD-10-CM | POA: Diagnosis not present

## 2015-06-04 DIAGNOSIS — Z87891 Personal history of nicotine dependence: Secondary | ICD-10-CM | POA: Diagnosis not present

## 2015-06-04 DIAGNOSIS — Z791 Long term (current) use of non-steroidal anti-inflammatories (NSAID): Secondary | ICD-10-CM | POA: Diagnosis not present

## 2015-06-04 DIAGNOSIS — I252 Old myocardial infarction: Secondary | ICD-10-CM | POA: Diagnosis not present

## 2015-06-04 DIAGNOSIS — Z955 Presence of coronary angioplasty implant and graft: Secondary | ICD-10-CM | POA: Diagnosis not present

## 2015-06-04 DIAGNOSIS — I4891 Unspecified atrial fibrillation: Secondary | ICD-10-CM | POA: Diagnosis not present

## 2015-06-04 DIAGNOSIS — F419 Anxiety disorder, unspecified: Secondary | ICD-10-CM | POA: Diagnosis present

## 2015-06-04 DIAGNOSIS — Z7901 Long term (current) use of anticoagulants: Secondary | ICD-10-CM | POA: Diagnosis not present

## 2015-06-04 DIAGNOSIS — Z859 Personal history of malignant neoplasm, unspecified: Secondary | ICD-10-CM | POA: Diagnosis not present

## 2015-06-04 DIAGNOSIS — E119 Type 2 diabetes mellitus without complications: Secondary | ICD-10-CM | POA: Diagnosis not present

## 2015-06-04 DIAGNOSIS — I10 Essential (primary) hypertension: Secondary | ICD-10-CM | POA: Diagnosis not present

## 2015-06-04 DIAGNOSIS — M549 Dorsalgia, unspecified: Secondary | ICD-10-CM | POA: Diagnosis not present

## 2015-06-04 DIAGNOSIS — E860 Dehydration: Secondary | ICD-10-CM | POA: Diagnosis present

## 2015-06-04 DIAGNOSIS — G4700 Insomnia, unspecified: Secondary | ICD-10-CM | POA: Diagnosis present

## 2015-06-04 DIAGNOSIS — Z86711 Personal history of pulmonary embolism: Secondary | ICD-10-CM | POA: Diagnosis not present

## 2015-06-04 DIAGNOSIS — I2510 Atherosclerotic heart disease of native coronary artery without angina pectoris: Secondary | ICD-10-CM | POA: Diagnosis not present

## 2015-06-04 DIAGNOSIS — R7303 Prediabetes: Secondary | ICD-10-CM | POA: Diagnosis present

## 2015-06-04 DIAGNOSIS — Z86718 Personal history of other venous thrombosis and embolism: Secondary | ICD-10-CM | POA: Diagnosis not present

## 2015-06-04 DIAGNOSIS — G8929 Other chronic pain: Secondary | ICD-10-CM | POA: Diagnosis not present

## 2015-06-04 DIAGNOSIS — R0789 Other chest pain: Secondary | ICD-10-CM | POA: Diagnosis not present

## 2015-06-04 DIAGNOSIS — Z79899 Other long term (current) drug therapy: Secondary | ICD-10-CM | POA: Diagnosis not present

## 2015-06-04 DIAGNOSIS — R0600 Dyspnea, unspecified: Secondary | ICD-10-CM | POA: Diagnosis not present

## 2015-06-04 DIAGNOSIS — I517 Cardiomegaly: Secondary | ICD-10-CM | POA: Diagnosis not present

## 2015-06-04 DIAGNOSIS — E785 Hyperlipidemia, unspecified: Secondary | ICD-10-CM | POA: Diagnosis not present

## 2015-06-10 ENCOUNTER — Ambulatory Visit (INDEPENDENT_AMBULATORY_CARE_PROVIDER_SITE_OTHER): Payer: Medicare Other | Admitting: Family Medicine

## 2015-06-10 VITALS — BP 149/89 | HR 49

## 2015-06-10 DIAGNOSIS — Z7901 Long term (current) use of anticoagulants: Secondary | ICD-10-CM | POA: Diagnosis not present

## 2015-06-10 DIAGNOSIS — I2699 Other pulmonary embolism without acute cor pulmonale: Secondary | ICD-10-CM | POA: Diagnosis not present

## 2015-06-10 LAB — POCT INR: INR: 3.6

## 2015-06-10 NOTE — Progress Notes (Signed)
Will you please let patient know that his INR was 3.6 and higher than the goal of between 2-3.  This is out of character for him so I'd recommend temporarily stopping a dose for two straight days and then resume 10mg  daily, return in one week after restarting the 10mg  to have an INR rechecked.

## 2015-06-10 NOTE — Progress Notes (Signed)
Pt notified.  He stated that his hematologist told him that his INR needs to be between 2.6-3.2.

## 2015-06-15 ENCOUNTER — Encounter: Payer: Self-pay | Admitting: Family Medicine

## 2015-06-15 ENCOUNTER — Ambulatory Visit (INDEPENDENT_AMBULATORY_CARE_PROVIDER_SITE_OTHER): Payer: Medicare Other | Admitting: Family Medicine

## 2015-06-15 VITALS — BP 161/101 | HR 61 | Wt 313.0 lb

## 2015-06-15 DIAGNOSIS — I480 Paroxysmal atrial fibrillation: Secondary | ICD-10-CM | POA: Insufficient documentation

## 2015-06-15 MED ORDER — DILTIAZEM HCL ER COATED BEADS 180 MG PO CP24: 180.0000 mg | ORAL_CAPSULE | Freq: Every day | ORAL | Status: DC

## 2015-06-15 MED ORDER — TEMAZEPAM 15 MG PO CAPS: 15.0000 mg | ORAL_CAPSULE | Freq: Every evening | ORAL | Status: DC | PRN

## 2015-06-15 NOTE — Progress Notes (Signed)
CC: Jeff Green is a 60 y.o. male is here for Hospitalization Follow-up   Subjective: HPI:  Little over a week ago patient was experiencing some chest heaviness and diaphoresis and went to a local emergency room. He was found to be in atrial fibrillation with rapid ventricular response which was improved after a diltiazem drip. He was appropriately anticoagulated with Coumadin prior to arrival. He was also started on amiodarone and on the third day of hospitalization he apparently converted back to normal sinus rhythm. Ever since being home he reports an occasional heaviness of the chest but no rapid heartbeat or irregular heartbeat. He had a stress test while admitted which was unremarkable. He denies any peripheral edema or lightheadedness. Denies any new motor or sensory disturbances. He is getting a headache after taking metoprolol and wants to know what to do about this, that his only complaint today. He denies fevers, chills, wheezing or chest discomfort.   Review Of Systems Outlined In HPI  Past Medical History  Diagnosis Date  . Hypertension   . High cholesterol   . Congestive heart failure (CHF) [facility])     Past Surgical History  Procedure Laterality Date  . Lamenectomy    . Left orchiectomy    . Hernia repair    . Ivc  2014   Family History  Problem Relation Age of Onset  . Thyroid cancer Mother   . Depression Mother   . High Cholesterol Father   . High blood pressure Father   . Stroke      Grandfather    Social History   Social History  . Marital Status: Married    Spouse Name: N/A  . Number of Children: N/A  . Years of Education: N/A   Occupational History  . Not on file.   Social History Main Topics  . Smoking status: Former Smoker    Quit date: 08/10/1999  . Smokeless tobacco: Not on file  . Alcohol Use: No  . Drug Use: No  . Sexual Activity:    Partners: Female   Other Topics Concern  . Not on file   Social History Narrative      Objective: BP 161/101 mmHg  Pulse 61  Wt 313 lb (141.976 kg)  General: Alert and Oriented, No Acute Distress HEENT: Pupils equal, round, reactive to light. Conjunctivae clear.  Moist mucous membranes Lungs: Clear to auscultation bilaterally, no wheezing/ronchi/rales.  Comfortable work of breathing. Good air movement. Cardiac: Regular rate and rhythm. Normal S1/S2.  No murmurs, rubs, nor gallops.   Extremities: No peripheral edema.  Strong peripheral pulses.  Mental Status: No depression, anxiety, nor agitation. Skin: Warm and dry.  Assessment & Plan: Jeff Green was seen today for hospitalization follow-up.  Diagnoses and all orders for this visit:  Paroxysmal atrial fibrillation (Jeff Green) -     Ambulatory referral to Cardiology  Other orders -     diltiazem (CARDIZEM CD) 180 MG 24 hr capsule; Take 1 capsule (180 mg total) by mouth daily. -     temazepam (RESTORIL) 15 MG capsule; Take 1 capsule (15 mg total) by mouth at bedtime as needed for sleep.   Paroxysmal atrial fibrillation currently rate controlled with metoprolol and diltiazem. Blood pressure is not controlled so I would like him to increase the diltiazem. To try to minimize his headache he can try splitting metoprolol half taking a dose in the morning and in the evening. Today he appears to be in normal sinus rhythm. Per his request he would  like to have a cardiologist in the [place] area.  25 minutes spent face-to-face during visit today of which at least 50% was counseling or coordinating care regarding: 1. Paroxysmal atrial fibrillation (HCC)       Return in about 1 week (around 06/22/2015) for Nurse Visit INR Check.

## 2015-06-22 ENCOUNTER — Ambulatory Visit (INDEPENDENT_AMBULATORY_CARE_PROVIDER_SITE_OTHER): Payer: Medicare Other | Admitting: Family Medicine

## 2015-06-22 VITALS — BP 149/96 | HR 60

## 2015-06-22 DIAGNOSIS — Z7901 Long term (current) use of anticoagulants: Secondary | ICD-10-CM

## 2015-06-22 DIAGNOSIS — I2699 Other pulmonary embolism without acute cor pulmonale: Secondary | ICD-10-CM | POA: Diagnosis not present

## 2015-06-22 LAB — POCT INR: INR: 2.7

## 2015-06-22 NOTE — Progress Notes (Signed)
Vm left for pt with instructions, pt advised to call with any questions

## 2015-06-22 NOTE — Progress Notes (Signed)
Will you please let patient know that his INR was 2.7 and is at goal. Continue coumadin 10mg  daily and return for a repeat in one month.

## 2015-06-24 ENCOUNTER — Other Ambulatory Visit: Payer: Self-pay | Admitting: Family Medicine

## 2015-06-29 ENCOUNTER — Telehealth: Payer: Self-pay | Admitting: Family Medicine

## 2015-06-29 NOTE — Telephone Encounter (Signed)
New Message  This message is to inform you that we have made 4 consecutive attempts to contact the patient since 06/16/15. We have also mailed a letter to the patient to inform them to call in and schedule. Although we were unsuccessful in these attempts we wanted you to be aware of our efforts. Will remove the patient from our work queue at this time.    [facility]

## 2015-07-06 ENCOUNTER — Other Ambulatory Visit: Payer: Self-pay | Admitting: *Deleted

## 2015-07-06 ENCOUNTER — Other Ambulatory Visit: Payer: Self-pay

## 2015-07-06 MED ORDER — WARFARIN SODIUM 10 MG PO TABS: ORAL_TABLET | ORAL | Status: DC

## 2015-07-06 MED ORDER — METOPROLOL SUCCINATE ER 200 MG PO TB24: ORAL_TABLET | ORAL | Status: DC

## 2015-07-08 ENCOUNTER — Other Ambulatory Visit: Payer: Self-pay

## 2015-07-08 MED ORDER — AMIODARONE HCL 200 MG PO TABS: 200.0000 mg | ORAL_TABLET | Freq: Two times a day (BID) | ORAL | Status: DC

## 2015-07-11 ENCOUNTER — Encounter: Payer: Self-pay | Admitting: Family Medicine

## 2015-07-11 ENCOUNTER — Ambulatory Visit (INDEPENDENT_AMBULATORY_CARE_PROVIDER_SITE_OTHER): Payer: Medicare Other | Admitting: Family Medicine

## 2015-07-11 VITALS — BP 153/75 | HR 66 | Temp 98.5°F | Wt 315.0 lb

## 2015-07-11 DIAGNOSIS — J209 Acute bronchitis, unspecified: Secondary | ICD-10-CM | POA: Diagnosis not present

## 2015-07-11 MED ORDER — IPRATROPIUM BROMIDE 0.06 % NA SOLN: 2.0000 | Freq: Four times a day (QID) | NASAL | Status: DC

## 2015-07-11 MED ORDER — ALBUTEROL SULFATE HFA 108 (90 BASE) MCG/ACT IN AERS: 2.0000 | INHALATION_SPRAY | Freq: Four times a day (QID) | RESPIRATORY_TRACT | Status: DC | PRN

## 2015-07-11 MED ORDER — GUAIFENESIN-CODEINE 100-10 MG/5ML PO SOLN: 5.0000 mL | Freq: Every evening | ORAL | Status: DC | PRN

## 2015-07-11 MED ORDER — PREDNISONE 10 MG (48) PO TBPK: ORAL_TABLET | Freq: Every day | ORAL | Status: DC

## 2015-07-11 MED ORDER — AZITHROMYCIN 250 MG PO TABS: 250.0000 mg | ORAL_TABLET | Freq: Every day | ORAL | Status: DC

## 2015-07-11 NOTE — Assessment & Plan Note (Addendum)
Bronchitis likely. X-ray pending. Empiric treatment with prednisone albuterol azithromycin codeine cough syrup and Atrovent nasal spray. Additionally recommend Flonase and Zyrtec. Follow-up with PCP. Patient may benefit from spirometry to evaluate for lung disease due to amiodarone.

## 2015-07-11 NOTE — Progress Notes (Signed)
Jeff Green is a 60 y.o. male who presents to [facility]: [facility] today for cough and congestion. Patient notes a several week history of cough and congestion with some wheezing. Symptoms are similar to previous episodes of bronchitis. No fevers or chills nausea or vomiting. He's already had a course of prednisone and antibiotics which have not helped. He takes amiodarone for his heart disease. He's never had a history of COPD or asthma.   Past Medical History  Diagnosis Date  . Hypertension   . High cholesterol   . Congestive heart failure (CHF) [facility])    Past Surgical History  Procedure Laterality Date  . Lamenectomy    . Left orchiectomy    . Hernia repair    . Ivc  2014   Social History  Substance Use Topics  . Smoking status: Former Smoker    Quit date: 08/10/1999  . Smokeless tobacco: Not on file  . Alcohol Use: No   family history includes Depression in his mother; High Cholesterol in his father; High blood pressure in his father; Thyroid cancer in his mother.  ROS as above Medications: Current Outpatient Prescriptions  Medication Sig Dispense Refill  . AMBULATORY NON FORMULARY MEDICATION Take 500 mg by mouth. Tumeric    . amiodarone (PACERONE) 200 MG tablet Take 1 tablet (200 mg total) by mouth 2 (two) times daily. 60 tablet 0  . Biotin 5000 MCG CAPS Take by mouth.    . Cholecalciferol (VITAMIN D3) 5000 UNITS TABS Take by mouth 2 (two) times daily between meals.    . Coenzyme Q10 (COQ-10 PO) Take by mouth.    . Cyanocobalamin (B-12 SL) Place under the tongue.    . diltiazem (CARDIZEM CD) 180 MG 24 hr capsule Take 1 capsule (180 mg total) by mouth daily. 90 capsule 1  . GLUCOSAMINE CHONDROITIN COMPLX PO Take by mouth. Pt takes 1500-1288mg  per pt.    . ipratropium (ATROVENT) 0.06 % nasal spray Place 2 sprays into both nostrils 4 (four) times daily. 15 mL 1  . meloxicam  (MOBIC) 15 MG tablet Take 1 tablet (15 mg total) by mouth daily. 30 tablet 5  . methocarbamol (ROBAXIN) 750 MG tablet Take 1 tablet (750 mg total) by mouth every 8 (eight) hours as needed for muscle spasms. 90 tablet 5  . metoprolol (TOPROL XL) 200 MG 24 hr tablet Half tab by mouth twice a day. 30 tablet 0  . Multiple Vitamin (MULTIVITAMIN) capsule Take 1 capsule by mouth daily.    Marland Kitchen oxyCODONE-acetaminophen (PERCOCET) 10-325 MG per tablet Take 1 tablet by mouth every 6 (six) hours as needed for pain. 76 tablet 0  . pravastatin (PRAVACHOL) 40 MG tablet Take 1 tablet (40 mg total) by mouth daily. 90 tablet 3  . Probiotic Product (PROBIOTIC DAILY PO) Take by mouth.    . temazepam (RESTORIL) 15 MG capsule Take 1 capsule (15 mg total) by mouth at bedtime as needed for sleep. 90 capsule 1  . testosterone cypionate (DEPOTESTOTERONE CYPIONATE) 200 MG/ML injection 0.68mL IM every other week. 10 mL   . warfarin (COUMADIN) 10 MG tablet Alternate between 10mg  and 9mg  every other day. 30 tablet 0  . warfarin (COUMADIN) 4 MG tablet TAKE ONE TABLET BY MOUTH ONCE DAILY BASED  ON  MOST  RECENT  INR 30 tablet 0  . albuterol (PROVENTIL HFA;VENTOLIN HFA) 108 (90 Base) MCG/ACT inhaler Inhale 2 puffs into the lungs every 6 (six) hours as needed  for wheezing or shortness of breath. 1 Inhaler 0  . azithromycin (ZITHROMAX) 250 MG tablet Take 1 tablet (250 mg total) by mouth daily. Take first 2 tablets together, then 1 every day until finished. 6 tablet 0  . guaiFENesin-codeine 100-10 MG/5ML syrup Take 5 mLs by mouth at bedtime as needed for cough. 120 mL 0  . predniSONE (STERAPRED UNI-PAK 48 TAB) 10 MG (48) TBPK tablet Take by mouth daily. 12 day dosepack po 48 tablet 0   No current facility-administered medications for this visit.   Allergies  Allergen Reactions  . Lipitor [Atorvastatin]     myalgias     Exam:  BP 153/75 mmHg  Pulse 66  Temp(Src) 98.5 F (36.9 C) (Oral)  Wt 315 lb (142.883 kg)  SpO2 94%  Gen:  Well NAD HEENT: EOMI,  MMM Clear nasal discharge. Lungs: Normal work of breathing. Coarse breath sounds bilaterally Heart: RRR no MRG Abd: NABS, Soft. Nondistended, Nontender Exts: Brisk capillary refill, warm and well perfused.   Chest x-ray pending.   No results found for this or any previous visit (from the past 24 hour(s)). No results found.   Please see individual assessment and plan sections.

## 2015-07-11 NOTE — Patient Instructions (Signed)
Thank you for coming in today. Take prednisone.  Get xray  Use cough medicine as needed.  Take also flonase nasal spray and zyrtec.  Follow up with Dr Lemmie Evens in 2 weeks or so.  Call or go to the emergency room if you get worse, have trouble breathing, have chest pains, or palpitations.   Acute Bronchitis Bronchitis is inflammation of the airways that extend from the windpipe into the lungs (bronchi). The inflammation often causes mucus to develop. This leads to a cough, which is the most common symptom of bronchitis.  In acute bronchitis, the condition usually develops suddenly and goes away over time, usually in a couple weeks. Smoking, allergies, and asthma can make bronchitis worse. Repeated episodes of bronchitis may cause further lung problems.  CAUSES Acute bronchitis is most often caused by the same virus that causes a cold. The virus can spread from person to person (contagious) through coughing, sneezing, and touching contaminated objects. SIGNS AND SYMPTOMS   Cough.   Fever.   Coughing up mucus.   Body aches.   Chest congestion.   Chills.   Shortness of breath.   Sore throat.  DIAGNOSIS  Acute bronchitis is usually diagnosed through a physical exam. Your health care provider will also ask you questions about your medical history. Tests, such as chest X-rays, are sometimes done to rule out other conditions.  TREATMENT  Acute bronchitis usually goes away in a couple weeks. Oftentimes, no medical treatment is necessary. Medicines are sometimes given for relief of fever or cough. Antibiotic medicines are usually not needed but may be prescribed in certain situations. In some cases, an inhaler may be recommended to help reduce shortness of breath and control the cough. A cool mist vaporizer may also be used to help thin bronchial secretions and make it easier to clear the chest.  HOME CARE INSTRUCTIONS  Get plenty of rest.   Drink enough fluids to keep your urine clear  or pale yellow (unless you have a medical condition that requires fluid restriction). Increasing fluids may help thin your respiratory secretions (sputum) and reduce chest congestion, and it will prevent dehydration.   Take medicines only as directed by your health care provider.  If you were prescribed an antibiotic medicine, finish it all even if you start to feel better.  Avoid smoking and secondhand smoke. Exposure to cigarette smoke or irritating chemicals will make bronchitis worse. If you are a smoker, consider using nicotine gum or skin patches to help control withdrawal symptoms. Quitting smoking will help your lungs heal faster.   Reduce the chances of another bout of acute bronchitis by washing your hands frequently, avoiding people with cold symptoms, and trying not to touch your hands to your mouth, nose, or eyes.   Keep all follow-up visits as directed by your health care provider.  SEEK MEDICAL CARE IF: Your symptoms do not improve after 1 week of treatment.  SEEK IMMEDIATE MEDICAL CARE IF:  You develop an increased fever or chills.   You have chest pain.   You have severe shortness of breath.  You have bloody sputum.   You develop dehydration.  You faint or repeatedly feel like you are going to pass out.  You develop repeated vomiting.  You develop a severe headache. MAKE SURE YOU:   Understand these instructions.  Will watch your condition.  Will get help right away if you are not doing well or get worse.   This information is not intended to replace advice  given to you by your health care provider. Make sure you discuss any questions you have with your health care provider.   Document Released: 05/17/2004 Document Revised: 04/30/2014 Document Reviewed: 09/30/2012 Elsevier Interactive Patient Education [employer].

## 2015-07-12 ENCOUNTER — Ambulatory Visit (INDEPENDENT_AMBULATORY_CARE_PROVIDER_SITE_OTHER): Payer: Medicare Other

## 2015-07-12 DIAGNOSIS — I509 Heart failure, unspecified: Secondary | ICD-10-CM | POA: Diagnosis not present

## 2015-07-12 DIAGNOSIS — R05 Cough: Secondary | ICD-10-CM | POA: Diagnosis not present

## 2015-07-12 NOTE — Progress Notes (Signed)
Quick Note:  Xray shows a little heart failure (fluid backing up into the lungs). Follow up with Dr. Ileene Rubens. ______

## 2015-07-13 ENCOUNTER — Telehealth: Payer: Self-pay

## 2015-07-13 NOTE — Telephone Encounter (Signed)
Left message advising of recommendations and a return call.  

## 2015-07-13 NOTE — Telephone Encounter (Signed)
Something that contain guaifenesin or dextromethorphan. The patient should return to clinic to follow-up his symptoms and get worked up for potential heart failure.

## 2015-07-13 NOTE — Telephone Encounter (Signed)
Jeff Green complains the cough medication is not working. He wanted to know if you recommend any OTC cough medications.

## 2015-07-14 NOTE — Telephone Encounter (Signed)
Left a message for a return call.

## 2015-07-25 DIAGNOSIS — M545 Low back pain: Secondary | ICD-10-CM | POA: Diagnosis not present

## 2015-07-25 DIAGNOSIS — M961 Postlaminectomy syndrome, not elsewhere classified: Secondary | ICD-10-CM | POA: Diagnosis not present

## 2015-07-26 ENCOUNTER — Other Ambulatory Visit: Payer: Self-pay | Admitting: Family Medicine

## 2015-07-26 ENCOUNTER — Ambulatory Visit: Payer: Medicare Other

## 2015-07-26 DIAGNOSIS — I480 Paroxysmal atrial fibrillation: Secondary | ICD-10-CM | POA: Diagnosis not present

## 2015-07-26 DIAGNOSIS — I4891 Unspecified atrial fibrillation: Secondary | ICD-10-CM | POA: Diagnosis not present

## 2015-07-27 ENCOUNTER — Other Ambulatory Visit: Payer: Self-pay

## 2015-07-27 LAB — PROTIME-INR
INR: 6.85 (ref <1.50)
Prothrombin Time: 60.5 seconds — ABNORMAL HIGH (ref 11.6–15.2)

## 2015-07-27 MED ORDER — AMIODARONE HCL 200 MG PO TABS: 200.0000 mg | ORAL_TABLET | Freq: Two times a day (BID) | ORAL | Status: DC

## 2015-07-27 MED ORDER — METOPROLOL SUCCINATE ER 200 MG PO TB24: ORAL_TABLET | ORAL | Status: DC

## 2015-08-01 ENCOUNTER — Ambulatory Visit (INDEPENDENT_AMBULATORY_CARE_PROVIDER_SITE_OTHER): Payer: Medicare Other | Admitting: Family Medicine

## 2015-08-01 ENCOUNTER — Encounter: Payer: Self-pay | Admitting: Family Medicine

## 2015-08-01 VITALS — BP 79/53 | HR 125 | Wt 310.0 lb

## 2015-08-01 DIAGNOSIS — Z7901 Long term (current) use of anticoagulants: Secondary | ICD-10-CM

## 2015-08-01 DIAGNOSIS — I10 Essential (primary) hypertension: Secondary | ICD-10-CM

## 2015-08-01 LAB — POCT INR: INR: 1.6

## 2015-08-01 MED ORDER — DILTIAZEM HCL ER COATED BEADS 120 MG PO CP24: 120.0000 mg | ORAL_CAPSULE | Freq: Every day | ORAL | Status: DC

## 2015-08-01 MED ORDER — AMIODARONE HCL 200 MG PO TABS: 200.0000 mg | ORAL_TABLET | Freq: Two times a day (BID) | ORAL | Status: DC

## 2015-08-01 NOTE — Progress Notes (Signed)
CC: Jeff Green is a 60 y.o. male is here for Coagulation Disorder   Subjective: HPI:  Last week when his INR was 6 if He lightly scratched himself he would bleed. He stopped taking Coumadin for 3 days and restarted taking 10 mg and 9 mg alternating every other day. He denies any further bleeding or bruising abnormalities. The only thing changed around the time of his INR elevation was taking an antibiotic. He denies irregular heartbeat, chest pain, shortness of breath, wheezing or any motor or sensory disturbances.  He tells me that over the past week he's felt intensely fatigued. He can't clarify stairs without having to stop due to lightheadedness. He denies any chest pain or shortness of breath. Symptoms are moderate in severity occurring on a daily basis. He denies fevers or chills.   Review Of Systems Outlined In HPI  Past Medical History  Diagnosis Date  . Hypertension   . High cholesterol   . Congestive heart failure (CHF) [facility])     Past Surgical History  Procedure Laterality Date  . Lamenectomy    . Left orchiectomy    . Hernia repair    . Ivc  2014   Family History  Problem Relation Age of Onset  . Thyroid cancer Mother   . Depression Mother   . High Cholesterol Father   . High blood pressure Father   . Stroke      Grandfather    Social History   Social History  . Marital Status: Married    Spouse Name: N/A  . Number of Children: N/A  . Years of Education: N/A   Occupational History  . Not on file.   Social History Main Topics  . Smoking status: Former Smoker    Quit date: 08/10/1999  . Smokeless tobacco: Not on file  . Alcohol Use: No  . Drug Use: No  . Sexual Activity:    Partners: Female   Other Topics Concern  . Not on file   Social History Narrative     Objective: BP 79/53 mmHg  Pulse 125  Wt 310 lb (140.615 kg)  General: Alert and Oriented, No Acute Distress HEENT: Pupils equal, round, reactive to light. Conjunctivae clear.   Moist mucous membranes Lungs: Clear to auscultation bilaterally, no wheezing/ronchi/rales.  Comfortable work of breathing. Good air movement. Cardiac: Regular rate and rhythm. Normal S1/S2.  No murmurs, rubs, nor gallops.   Abdomen: Normal bowel sounds, soft and non tender without palpable masses. Extremities: No peripheral edema.  Strong peripheral pulses.  Mental Status: No depression, anxiety, nor agitation. Skin: Warm and dry.  Assessment & Plan: Jeff Green was seen today for coagulation disorder.  Diagnoses and all orders for this visit:  Long term (current) use of anticoagulants -     POCT INR  Essential hypertension -     diltiazem (CARDIZEM CD) 120 MG 24 hr capsule; Take 1 capsule (120 mg total) by mouth daily.  Other orders -     amiodarone (PACERONE) 200 MG tablet; Take 1 tablet (200 mg total) by mouth 2 (two) times daily.   INR today is now reassuring and much closer to goal. Continue alternating 9 mg and 10 mg Coumadin every other day He is asking for refill on amiodarone until he follows up with Dr. Stanford Green. Essential hypertension: Currently too high of a antihypertensive regimen therefore cutting back on diltiazem.  25 minutes spent face-to-face during visit today of which at least 50% was counseling or coordinating care regarding: 1.  Long term (current) use of anticoagulants   2. Essential hypertension     Return in about 1 week (around 08/08/2015) for nurse visit INR.

## 2015-08-08 ENCOUNTER — Ambulatory Visit (INDEPENDENT_AMBULATORY_CARE_PROVIDER_SITE_OTHER): Payer: Medicare Other | Admitting: Family Medicine

## 2015-08-08 VITALS — BP 167/98 | HR 60

## 2015-08-08 DIAGNOSIS — Z7901 Long term (current) use of anticoagulants: Secondary | ICD-10-CM | POA: Diagnosis not present

## 2015-08-08 DIAGNOSIS — I2699 Other pulmonary embolism without acute cor pulmonale: Secondary | ICD-10-CM

## 2015-08-08 LAB — POCT INR: INR: >4.5

## 2015-08-08 LAB — PROTIME-INR
INR: 4.48 — ABNORMAL HIGH (ref <1.50)
PROTHROMBIN TIME: 43 s — AB (ref 11.6–15.2)

## 2015-08-08 NOTE — Progress Notes (Signed)
Will you please let patient know that his INR from the lab confirms that his INR is too high. I'd recommend he cut back to taking only 8mg  of coumadin on a daily basis, this can be achieved by taking two of the 4mg  tablets daily. Please return for repeat INR in one week.

## 2015-08-08 NOTE — Progress Notes (Signed)
Recommendations left on vm.  Pt advised to call with any questions.

## 2015-08-08 NOTE — Progress Notes (Signed)
Left information on Pt's VM, callback provided for any questions and to schedule follow up.

## 2015-08-09 NOTE — Progress Notes (Signed)
HPI: 60 yo male for evaluation of atrial fibrillation. Patient also with history of coronary artery disease. He apparently had a stent placed in 2013 in [place]. Records are not available. Patient was admitted in 2/17 with new onset atrial fibrillation. Nuclear study February 2017 showed ejection fraction 46%. No ischemia or infarction. Echocardiogram February 2017 showed grossly normal LV function. TSH 1.270. Patient was treated with metoprolol, Cardizem and amiodarone. He converted to sinus rhythm. Note he is on chronic anticoagulation because of recurrent DVT and pulmonary embolus. When the patient was admitted for atrial fibrillation he had upper respiratory infection. He noticed his atrial fibrillation by lack of energy and increased dyspnea. He now has mild dyspnea on exertion but no orthopnea, PND, pedal edema, palpitations, syncope or bleeding. He has not had exertional chest pain.  Current Outpatient Prescriptions  Medication Sig Dispense Refill  . albuterol (PROVENTIL HFA;VENTOLIN HFA) 108 (90 Base) MCG/ACT inhaler Inhale 2 puffs into the lungs every 6 (six) hours as needed for wheezing or shortness of breath. 1 Inhaler 0  . AMBULATORY NON FORMULARY MEDICATION Take 500 mg by mouth. Tumeric    . amiodarone (PACERONE) 200 MG tablet Take 1 tablet (200 mg total) by mouth 2 (two) times daily. 60 tablet 2  . Biotin 5000 MCG CAPS Take by mouth.    . Cholecalciferol (VITAMIN D3) 5000 UNITS TABS Take by mouth 2 (two) times daily between meals.    . Coenzyme Q10 (COQ-10 PO) Take by mouth.    . Cyanocobalamin (B-12 SL) Place under the tongue.    . diltiazem (CARDIZEM CD) 120 MG 24 hr capsule Take 1 capsule (120 mg total) by mouth daily. 90 capsule 1  . GLUCOSAMINE CHONDROITIN COMPLX PO Take by mouth. Pt takes 1500-1288mg  per pt.    . ipratropium (ATROVENT) 0.06 % nasal spray Place 2 sprays into both nostrils 4 (four) times daily. 15 mL 1  . meloxicam (MOBIC) 15 MG tablet Take 1 tablet (15 mg  total) by mouth daily. 30 tablet 5  . methocarbamol (ROBAXIN) 750 MG tablet Take 1 tablet (750 mg total) by mouth every 8 (eight) hours as needed for muscle spasms. 90 tablet 5  . metoprolol (TOPROL XL) 200 MG 24 hr tablet Half tab by mouth twice a day. 30 tablet 0  . Multiple Vitamin (MULTIVITAMIN) capsule Take 1 capsule by mouth daily.    Marland Kitchen oxyCODONE-acetaminophen (PERCOCET) 10-325 MG per tablet Take 1 tablet by mouth every 6 (six) hours as needed for pain. 76 tablet 0  . pravastatin (PRAVACHOL) 40 MG tablet Take 1 tablet (40 mg total) by mouth daily. 90 tablet 3  . Probiotic Product (PROBIOTIC DAILY PO) Take by mouth.    . temazepam (RESTORIL) 15 MG capsule Take 1 capsule (15 mg total) by mouth at bedtime as needed for sleep. 90 capsule 1  . testosterone cypionate (DEPOTESTOTERONE CYPIONATE) 200 MG/ML injection 0.91mL IM every other week. 10 mL   . warfarin (COUMADIN) 10 MG tablet Alternate between 10mg  and 9mg  every other day. 30 tablet 0  . warfarin (COUMADIN) 4 MG tablet TAKE ONE TABLET BY MOUTH ONCE DAILY BASED  ON  MOST  RECENT  INR 30 tablet 0   No current facility-administered medications for this visit.    Allergies  Allergen Reactions  . Lipitor [Atorvastatin]     myalgias     Past Medical History  Diagnosis Date  . Hypertension   . High cholesterol   . Congestive heart failure (  CHF) ([place])   . CAD (coronary artery disease)     Stent placed; [place]  . Atrial fibrillation [facility])     Past Surgical History  Procedure Laterality Date  . Lamenectomy    . Left orchiectomy    . Hernia repair    . Ivc  2014    Social History   Social History  . Marital Status: Married    Spouse Name: N/A  . Number of Children: 5  . Years of Education: N/A   Occupational History  . Not on file.   Social History Main Topics  . Smoking status: Former Smoker    Quit date: 08/10/1999  . Smokeless tobacco: Not on file  . Alcohol Use: 0.0 oz/week    0 Standard drinks or  equivalent per week     Comment: Rare  . Drug Use: No  . Sexual Activity:    Partners: Female   Other Topics Concern  . Not on file   Social History Narrative    Family History  Problem Relation Age of Onset  . Thyroid cancer Mother   . Depression Mother   . High Cholesterol Father   . High blood pressure Father   . Stroke      Grandfather  . Heart disease Father     Atrial fibrillation    ROS: no fevers or chills, productive cough, hemoptysis, dysphasia, odynophagia, melena, hematochezia, dysuria, hematuria, rash, seizure activity, orthopnea, PND, pedal edema, claudication. Remaining systems are negative.  Physical Exam:   Blood pressure 169/96, pulse 70, height 6\' 2"  (1.88 m), weight 294 lb 12.8 oz (133.72 kg).  General:  Well developed/obese in NAD Skin warm/dry Patient not depressed No peripheral clubbing Back-normal HEENT-normal/normal eyelids Neck supple/normal carotid upstroke bilaterally; no bruits; no JVD; no thyromegaly chest - CTA/ normal expansion CV - RRR/normal S1 and S2; no murmurs, rubs or gallops;  PMI nondisplaced Abdomen -NT/ND, no HSM, no mass, + bowel sounds, no bruit 2+ femoral pulses, no bruits Ext-no edema, chords, 2+ DP Neuro-grossly nonfocal  ECG Sinus rhythm at a rate of 62. Cannot rule out prior inferior infarct.

## 2015-08-10 ENCOUNTER — Ambulatory Visit (INDEPENDENT_AMBULATORY_CARE_PROVIDER_SITE_OTHER): Payer: Medicare Other | Admitting: Cardiology

## 2015-08-10 ENCOUNTER — Encounter: Payer: Self-pay | Admitting: Cardiology

## 2015-08-10 ENCOUNTER — Telehealth: Payer: Self-pay | Admitting: Family Medicine

## 2015-08-10 VITALS — BP 169/96 | HR 70 | Ht 74.0 in | Wt 294.8 lb

## 2015-08-10 DIAGNOSIS — I2510 Atherosclerotic heart disease of native coronary artery without angina pectoris: Secondary | ICD-10-CM | POA: Diagnosis not present

## 2015-08-10 DIAGNOSIS — E785 Hyperlipidemia, unspecified: Secondary | ICD-10-CM | POA: Diagnosis not present

## 2015-08-10 DIAGNOSIS — I480 Paroxysmal atrial fibrillation: Secondary | ICD-10-CM

## 2015-08-10 DIAGNOSIS — I10 Essential (primary) hypertension: Secondary | ICD-10-CM

## 2015-08-10 DIAGNOSIS — I4891 Unspecified atrial fibrillation: Secondary | ICD-10-CM | POA: Diagnosis not present

## 2015-08-10 MED ORDER — LOSARTAN POTASSIUM 50 MG PO TABS: 50.0000 mg | ORAL_TABLET | Freq: Every day | ORAL | Status: DC

## 2015-08-10 NOTE — Assessment & Plan Note (Signed)
Continue statin.No aspirin given need for anticoagulation. 

## 2015-08-10 NOTE — Telephone Encounter (Signed)
Patient walking into clinic today to see if PCP would like to make any adjustments to his coumarin dosage based on recent cardiology visit. At that visit Pt was advised to stop amiodarone and start losartan. Pt was also advised to discuss with PCP if starting xarelto or eliquis would be appropriate. Will route to PCP for review, Pt advised to make no coumadin changes until he hears from office.

## 2015-08-10 NOTE — Assessment & Plan Note (Addendum)
Patient remains in sinus rhythm. His atrial fibrillation occurred in the setting of upper respiratory infection. I would like to avoid amiodarone long-term given his age. We will discontinue and follow for recurrent arrhythmias. Continue metoprolol and Cardizem. Continue Coumadin. As outlined above we will check the price of DOACs and transition if affordable. Note approximately 20 minutes spent reviewing outside records prior to patient arrival.

## 2015-08-10 NOTE — Assessment & Plan Note (Signed)
Continue Coumadin.INR followed by primary care. I have provided the names of apixaban and xarelto. We could transition if his insurance covers.

## 2015-08-10 NOTE — Assessment & Plan Note (Signed)
Continue statin. 

## 2015-08-10 NOTE — Telephone Encounter (Signed)
Jeff Green, Will you please let Hayzen know that if he's interested there are two alternatives to coumadin where he would never have to have his INR checked.  It's ultimately up to him but if I were him I'd switch to xarelto or eliquis.  Just let me know if he's interested, otherwise no change to current coumadin regimen.

## 2015-08-10 NOTE — Patient Instructions (Signed)
Medication Instructions:   START LOSARTAN 50 MG ONCE DAILY  STOP AMIODARONE  XARELTO OR ELIQUIS TO REPLACE WARFARIN  Labwork:  Your physician recommends that you return for lab work in: [place]:  Your physician recommends that you schedule a follow-up appointment in: [place]

## 2015-08-10 NOTE — Telephone Encounter (Signed)
Pt advised of recommendation, states he wants to call his insurance company and see which is approved. In the mean time he will continue coumadin at current dose.

## 2015-08-10 NOTE — Assessment & Plan Note (Signed)
Blood pressure elevated. Add Cozaar 50 mg daily. Check potassium and renal function in 1 week.

## 2015-08-15 ENCOUNTER — Ambulatory Visit (INDEPENDENT_AMBULATORY_CARE_PROVIDER_SITE_OTHER): Payer: Medicare Other | Admitting: Family Medicine

## 2015-08-15 VITALS — BP 135/90 | HR 61 | Wt 314.0 lb

## 2015-08-15 DIAGNOSIS — Z7901 Long term (current) use of anticoagulants: Secondary | ICD-10-CM | POA: Diagnosis not present

## 2015-08-15 DIAGNOSIS — I2699 Other pulmonary embolism without acute cor pulmonale: Secondary | ICD-10-CM | POA: Diagnosis not present

## 2015-08-15 LAB — POCT INR: INR: 3.3

## 2015-08-15 MED ORDER — WARFARIN SODIUM 4 MG PO TABS: 8.0000 mg | ORAL_TABLET | Freq: Once | ORAL | Status: DC

## 2015-08-15 MED ORDER — WARFARIN SODIUM 7.5 MG PO TABS: 7.5000 mg | ORAL_TABLET | Freq: Every day | ORAL | Status: DC

## 2015-08-15 NOTE — Progress Notes (Signed)
Patient advised of results and recommendations.  

## 2015-08-15 NOTE — Progress Notes (Signed)
Will you please let patient know that his INR was elevated but much better than last week and is almost at goal. I'd recommend he decrease his daily coumadin dose to 7.5mg , I've sent  New Rx to [facility] and would recommend rechecking this in one week.

## 2015-08-17 DIAGNOSIS — I4891 Unspecified atrial fibrillation: Secondary | ICD-10-CM | POA: Diagnosis not present

## 2015-08-18 LAB — BASIC METABOLIC PANEL
BUN: 24 mg/dL (ref 7–25)
CHLORIDE: 101 mmol/L (ref 98–110)
CO2: 26 mmol/L (ref 20–31)
Calcium: 9.8 mg/dL (ref 8.6–10.3)
Creat: 1.35 mg/dL — ABNORMAL HIGH (ref 0.70–1.33)
GLUCOSE: 116 mg/dL — AB (ref 65–99)
POTASSIUM: 4.7 mmol/L (ref 3.5–5.3)
Sodium: 139 mmol/L (ref 135–146)

## 2015-08-22 ENCOUNTER — Telehealth: Payer: Self-pay

## 2015-08-22 ENCOUNTER — Ambulatory Visit (INDEPENDENT_AMBULATORY_CARE_PROVIDER_SITE_OTHER): Payer: Medicare Other | Admitting: Family Medicine

## 2015-08-22 VITALS — BP 156/92 | HR 64

## 2015-08-22 DIAGNOSIS — Z7901 Long term (current) use of anticoagulants: Secondary | ICD-10-CM | POA: Diagnosis not present

## 2015-08-22 DIAGNOSIS — I2699 Other pulmonary embolism without acute cor pulmonale: Secondary | ICD-10-CM | POA: Diagnosis not present

## 2015-08-22 LAB — POCT INR: INR: 3.4

## 2015-08-22 MED ORDER — WARFARIN SODIUM 6 MG PO TABS: 6.0000 mg | ORAL_TABLET | Freq: Every day | ORAL | Status: DC

## 2015-08-22 MED ORDER — WARFARIN SODIUM 1 MG PO TABS: ORAL_TABLET | ORAL | Status: DC

## 2015-08-22 MED ORDER — WARFARIN SODIUM 5 MG PO TABS: ORAL_TABLET | ORAL | Status: DC

## 2015-08-22 NOTE — Telephone Encounter (Signed)
Sounds good to me, I updated [facility]

## 2015-08-22 NOTE — Progress Notes (Signed)
Left a message advising of recommendations.  

## 2015-08-22 NOTE — Progress Notes (Signed)
Will you please let patient know that his INR was elevated but much better than last month and is almost at goal. I'd recommend he decrease his daily coumadin dose to 6mg , I've sent  New Rx to [facility] and would recommend rechecking this in one week.

## 2015-08-22 NOTE — Telephone Encounter (Signed)
Jeff Green would rather have 1 mg tablets of coumadin instead of the 6 mg. He has 5 mg and 10 mg tablets at home. Please advise.

## 2015-08-29 ENCOUNTER — Ambulatory Visit (INDEPENDENT_AMBULATORY_CARE_PROVIDER_SITE_OTHER): Payer: Medicare Other | Admitting: Family Medicine

## 2015-08-29 ENCOUNTER — Encounter: Payer: Self-pay | Admitting: Family Medicine

## 2015-08-29 VITALS — BP 200/110 | HR 56 | Wt 313.0 lb

## 2015-08-29 DIAGNOSIS — I2510 Atherosclerotic heart disease of native coronary artery without angina pectoris: Secondary | ICD-10-CM | POA: Diagnosis not present

## 2015-08-29 DIAGNOSIS — I10 Essential (primary) hypertension: Secondary | ICD-10-CM

## 2015-08-29 DIAGNOSIS — I4891 Unspecified atrial fibrillation: Secondary | ICD-10-CM

## 2015-08-29 DIAGNOSIS — I480 Paroxysmal atrial fibrillation: Secondary | ICD-10-CM | POA: Diagnosis not present

## 2015-08-29 DIAGNOSIS — I2699 Other pulmonary embolism without acute cor pulmonale: Secondary | ICD-10-CM | POA: Diagnosis not present

## 2015-08-29 LAB — POCT INR: INR: 2

## 2015-08-29 NOTE — Progress Notes (Signed)
CC: Jeff Green is a 60 y.o. male is here for Coagulation Disorder and Hypertension   Subjective: HPI:  Follow-up essential hypertension: He is tolerating metoprolol and diltiazem and losartan with blood pressures ranging from the stage I to stage II hypertension range at home. He denies any chest pain shortness of breath nor orthopnea. He denies any peripheral edema. He tells me he doesn't add salt to his food and he believes he has wife is already cut out as much salt in her diet as they can.  Follow-up recurrent pulmonary emboli: He is taking 6 kg Coumadin on a daily basis with no bleeding or bruising abnormalities. He denies any chest discomfort.  Follow-up atrial fibrillation: He denies any irregular heartbeat since he saw his cardiologist last and amiodarone was stopped.  Review Of Systems Outlined In HPI  Past Medical History  Diagnosis Date  . Hypertension   . High cholesterol   . Congestive heart failure (CHF) ([place])   . CAD (coronary artery disease)     Stent placed; [place]  . Atrial fibrillation ([place])   . Pulmonary embolus ([place])   . DVT (deep vein thrombosis) in pregnancy     Past Surgical History  Procedure Laterality Date  . Lamenectomy    . Left orchiectomy    . Hernia repair    . Ivc  2014   Family History  Problem Relation Age of Onset  . Thyroid cancer Mother   . Depression Mother   . High Cholesterol Father   . High blood pressure Father   . Stroke      Grandfather  . Heart disease Father     Atrial fibrillation    Social History   Social History  . Marital Status: Married    Spouse Name: N/A  . Number of Children: 5  . Years of Education: N/A   Occupational History  . Not on file.   Social History Main Topics  . Smoking status: Former Smoker    Quit date: 08/10/1999  . Smokeless tobacco: Not on file  . Alcohol Use: 0.0 oz/week    0 Standard drinks or equivalent per week     Comment: Rare  . Drug Use: No  . Sexual  Activity:    Partners: Female   Other Topics Concern  . Not on file   Social History Narrative     Objective: BP 200/110 mmHg  Pulse 56  Wt 313 lb (141.976 kg)  General: Alert and Oriented, No Acute Distress HEENT: Pupils equal, round, reactive to light. Conjunctivae clear.   Lungs: Clear to auscultation bilaterally, no wheezing/ronchi/rales.  Comfortable work of breathing. Good air movement. Cardiac: Regular rate and rhythm. Normal S1/S2.  No murmurs, rubs, nor gallops.   Extremities: No peripheral edema.  Strong peripheral pulses.  Mental Status: No depression, anxiety, nor agitation. Skin: Warm and dry.  Assessment & Plan: Jeff Green was seen today for coagulation disorder and hypertension.  Diagnoses and all orders for this visit:  Essential hypertension  Recurrent pulmonary emboli (HCC) -     POCT INR  Paroxysmal atrial fibrillation (HCC) -     POCT INR  Atrial fibrillation, unspecified type (HCC) -     POCT INR   Essential hypertension: Uncontrolled chronic condition, I've asked him to write out a detailed food diary for the next week and drop it off with blood pressure so I can see if he and his wife are overlooking some form of sodium that is sticking  into his diet. Recurrent pulmonary emboli: INR therapeutic at 2.0 today continue 6 mrem of tubing daily and recheck in 1 week Atrial fibrillation: Controlled, currently in normal sinus rhythm.   Return in about 1 week (around 09/05/2015) for Dropping off food diary and INR.

## 2015-09-06 ENCOUNTER — Telehealth: Payer: Self-pay | Admitting: Family Medicine

## 2015-09-06 ENCOUNTER — Ambulatory Visit (INDEPENDENT_AMBULATORY_CARE_PROVIDER_SITE_OTHER): Payer: Medicare Other | Admitting: Family Medicine

## 2015-09-06 VITALS — BP 138/84 | HR 63

## 2015-09-06 DIAGNOSIS — Z7901 Long term (current) use of anticoagulants: Secondary | ICD-10-CM | POA: Diagnosis not present

## 2015-09-06 DIAGNOSIS — I2510 Atherosclerotic heart disease of native coronary artery without angina pectoris: Secondary | ICD-10-CM

## 2015-09-06 DIAGNOSIS — I480 Paroxysmal atrial fibrillation: Secondary | ICD-10-CM

## 2015-09-06 LAB — POCT INR: INR: 1.6

## 2015-09-06 MED ORDER — LOSARTAN POTASSIUM-HCTZ 100-25 MG PO TABS: 1.0000 | ORAL_TABLET | Freq: Every day | ORAL | Status: DC

## 2015-09-06 NOTE — Telephone Encounter (Signed)
Amber I'm opening a phone note for this in addition to his progress not since there might be some back and forth messages for this:  At his last visit he told me he was taking 6mg  of coumadin on a daily basis, can you confirm what he actually has been taking at home this past week since sice your progress note says 7.5mg  daily.  Recommendations for coumadin adjustment will be based on what he's been taking.   Also his food diary reflects that he's eating an extremely healthy and low sodium diet.  I'd recommend he start a different form of losartan that has another medication called HCTZ mixed into it.  I've sent a new Rx to [facility] of this medication.

## 2015-09-06 NOTE — Telephone Encounter (Signed)
That makes more sense, I'd recommend increasing to 7mg  daily, he can use the 1mg  and 5mg  of coumadin that he already has if he would like. Repeat INR one week.

## 2015-09-06 NOTE — Progress Notes (Signed)
See phone note from today, I was under the impression he was taking coumadin 6mg  QD.  Also changing his losartan Rx.

## 2015-09-06 NOTE — Telephone Encounter (Signed)
Pt notified of coumadin increase.

## 2015-09-06 NOTE — Telephone Encounter (Signed)
Spoke with pt & he is in fact doing 6mg .  He was mistaken this morning.

## 2015-09-06 NOTE — Progress Notes (Signed)
   Subjective:    Patient ID: Jeff Green, male    DOB: 1955/05/08, 60 y.o.   MRN: [id number] INR today is 1.6.  Pt states he is taking 7.5mg  daily. No missed doses, bleeding, bruising, or diet changes.  Beatris Ship, CMA HPI    Review of Systems     Objective:   Physical Exam        Assessment & Plan:

## 2015-09-09 ENCOUNTER — Other Ambulatory Visit: Payer: Self-pay | Admitting: Family Medicine

## 2015-09-09 ENCOUNTER — Other Ambulatory Visit: Payer: Self-pay

## 2015-09-09 MED ORDER — MELOXICAM 15 MG PO TABS: 15.0000 mg | ORAL_TABLET | Freq: Every day | ORAL | Status: DC

## 2015-09-09 MED ORDER — METOPROLOL SUCCINATE ER 200 MG PO TB24: ORAL_TABLET | ORAL | Status: DC

## 2015-09-12 ENCOUNTER — Other Ambulatory Visit: Payer: Self-pay | Admitting: Family Medicine

## 2015-09-12 ENCOUNTER — Telehealth: Payer: Self-pay | Admitting: *Deleted

## 2015-09-12 ENCOUNTER — Ambulatory Visit (INDEPENDENT_AMBULATORY_CARE_PROVIDER_SITE_OTHER): Payer: Medicare Other | Admitting: Family Medicine

## 2015-09-12 VITALS — BP 157/85 | HR 63 | Wt 311.0 lb

## 2015-09-12 DIAGNOSIS — I2699 Other pulmonary embolism without acute cor pulmonale: Secondary | ICD-10-CM

## 2015-09-12 DIAGNOSIS — Z7901 Long term (current) use of anticoagulants: Secondary | ICD-10-CM | POA: Diagnosis not present

## 2015-09-12 LAB — POCT INR: INR: 1.8

## 2015-09-12 MED ORDER — WARFARIN SODIUM 7.5 MG PO TABS: 7.5000 mg | ORAL_TABLET | Freq: Every day | ORAL | Status: DC

## 2015-09-12 NOTE — Progress Notes (Signed)
Will you please let patient know that his INR is improving but still not at goal. I'd recommend switching from 7mg  of warfarin to 7.5mg  of warfarin daily. I sent a new Rx to [facility]. i'd recommend a repeat INR in 1-2 weeks.

## 2015-09-12 NOTE — Telephone Encounter (Signed)
Left message on patients vm with Dr.'s reccomendations

## 2015-09-12 NOTE — Telephone Encounter (Signed)
-----   Message from [place], [place] sent at 09/12/2015  9:55 AM EDT ----- Will you please let patient know that his INR is improving but still not at goal. I'd recommend switching from 7mg  of warfarin to 7.5mg  of warfarin daily. I sent a new Rx to [facility]. i'd recommend a repeat INR in 1-2 weeks.

## 2015-09-13 NOTE — Progress Notes (Signed)
Left detailed message on patient vm with instructions as noted below. Rhonda Cunningham,CMA

## 2015-09-20 ENCOUNTER — Other Ambulatory Visit: Payer: Self-pay | Admitting: Pain Medicine

## 2015-09-20 ENCOUNTER — Other Ambulatory Visit (HOSPITAL_BASED_OUTPATIENT_CLINIC_OR_DEPARTMENT_OTHER): Payer: Self-pay | Admitting: Pain Medicine

## 2015-09-20 DIAGNOSIS — Z79891 Long term (current) use of opiate analgesic: Secondary | ICD-10-CM | POA: Diagnosis not present

## 2015-09-20 DIAGNOSIS — G894 Chronic pain syndrome: Secondary | ICD-10-CM | POA: Diagnosis not present

## 2015-09-20 DIAGNOSIS — M545 Low back pain: Secondary | ICD-10-CM | POA: Diagnosis not present

## 2015-09-20 DIAGNOSIS — Z79899 Other long term (current) drug therapy: Secondary | ICD-10-CM | POA: Diagnosis not present

## 2015-09-20 DIAGNOSIS — M79606 Pain in leg, unspecified: Secondary | ICD-10-CM | POA: Diagnosis not present

## 2015-09-20 DIAGNOSIS — M961 Postlaminectomy syndrome, not elsewhere classified: Secondary | ICD-10-CM | POA: Diagnosis not present

## 2015-09-21 ENCOUNTER — Other Ambulatory Visit: Payer: Self-pay | Admitting: Pain Medicine

## 2015-09-21 DIAGNOSIS — M545 Low back pain: Secondary | ICD-10-CM

## 2015-09-21 DIAGNOSIS — M961 Postlaminectomy syndrome, not elsewhere classified: Secondary | ICD-10-CM | POA: Diagnosis not present

## 2015-09-26 ENCOUNTER — Ambulatory Visit (INDEPENDENT_AMBULATORY_CARE_PROVIDER_SITE_OTHER): Payer: Medicare Other | Admitting: Family Medicine

## 2015-09-26 VITALS — BP 165/94 | HR 61 | Wt 307.0 lb

## 2015-09-26 DIAGNOSIS — I2699 Other pulmonary embolism without acute cor pulmonale: Secondary | ICD-10-CM | POA: Diagnosis not present

## 2015-09-26 DIAGNOSIS — I480 Paroxysmal atrial fibrillation: Secondary | ICD-10-CM

## 2015-09-26 DIAGNOSIS — Z7901 Long term (current) use of anticoagulants: Secondary | ICD-10-CM

## 2015-09-26 DIAGNOSIS — E291 Testicular hypofunction: Secondary | ICD-10-CM

## 2015-09-26 LAB — POCT INR: INR: 2.7

## 2015-09-26 MED ORDER — CARVEDILOL 25 MG PO TABS: 25.0000 mg | ORAL_TABLET | Freq: Two times a day (BID) | ORAL | Status: DC

## 2015-09-26 NOTE — Progress Notes (Signed)
Pt here for INR check no missed doses, diet changes,bruising,bleeding,CP,SOB. Pt does c/o experiencing migraine headaches since taking metoprolol 200 mg. Dr. Ileene Rubens was informed of this. Also gave pt information about [facility] and ordered testosterone since he is overdue.Audelia Hives [place]

## 2015-09-26 NOTE — Progress Notes (Signed)
Will you please let patient know that his INR was perfect today and I'd recommend he continue taking 7.5mg  of coumadin daily. Repeat INR in one month.  Also I'd recommend stopping metoprolol and switching to carvediolol, Rx sent to [facility].

## 2015-09-27 ENCOUNTER — Telehealth: Payer: Self-pay | Admitting: *Deleted

## 2015-09-27 LAB — TESTOSTERONE TOTAL,FREE,BIO, MALES
ALBUMIN: 4.7 g/dL (ref 3.6–5.1)
SEX HORMONE BINDING: 25 nmol/L (ref 22–77)
TESTOSTERONE BIOAVAILABLE: 59.1 ng/dL — AB (ref 130.5–681.7)
TESTOSTERONE FREE: 27.6 pg/mL — AB (ref 47.0–244.0)
Testosterone: 185 ng/dL — ABNORMAL LOW (ref 250–827)

## 2015-09-27 NOTE — Telephone Encounter (Signed)
Will you please let patient know that his INR was perfect today and I'd recommend he continue taking 7.5mg  of coumadin daily. Repeat INR in one month. Also I'd recommend stopping metoprolol and switching to carvediolol, Rx sent to [facility].

## 2015-09-27 NOTE — Telephone Encounter (Signed)
lvm informing pt of recommendations. .Ajmal Kathan [place]  

## 2015-09-27 NOTE — Telephone Encounter (Signed)
I think carvediolol will provide better BP and headache control compared to metoprolol.  Whether or not he had AFib doesn't influence this recommendation, it's more based on bp and headache control.

## 2015-09-27 NOTE — Telephone Encounter (Signed)
Pt stated that he already has 50 mg metoprolol and wanted to know if you would like for him to just take this. He also stated that he was told that he was told that he really wasn't in A-fib. This condition to his medications per the cardiologist. He states that this was discussed at a cardiology appt with Dr Stanford Breed in February. Will fwd to pcp to advise.Audelia Hives [place]

## 2015-09-29 ENCOUNTER — Telehealth: Payer: Self-pay | Admitting: *Deleted

## 2015-09-29 NOTE — Telephone Encounter (Signed)
See result note.  

## 2015-10-03 ENCOUNTER — Ambulatory Visit (INDEPENDENT_AMBULATORY_CARE_PROVIDER_SITE_OTHER): Payer: Medicare Other

## 2015-10-03 DIAGNOSIS — M5136 Other intervertebral disc degeneration, lumbar region: Secondary | ICD-10-CM | POA: Diagnosis not present

## 2015-10-03 DIAGNOSIS — M479 Spondylosis, unspecified: Secondary | ICD-10-CM | POA: Diagnosis not present

## 2015-10-03 DIAGNOSIS — M5127 Other intervertebral disc displacement, lumbosacral region: Secondary | ICD-10-CM | POA: Diagnosis not present

## 2015-10-03 DIAGNOSIS — M545 Low back pain: Secondary | ICD-10-CM

## 2015-10-10 NOTE — Progress Notes (Signed)
HPI: FU atrial fibrillation. Patient also with history of coronary artery disease. He apparently had a stent placed in 2013 in [place]. Records are not available. Patient was admitted to [facility] in 2/17 with new onset atrial fibrillation. Nuclear study February 2017 showed ejection fraction 46%. No ischemia or infarction. Echocardiogram February 2017 showed grossly normal LV function. TSH 1.270. Patient was treated with metoprolol, Cardizem and amiodarone. He converted to sinus rhythm. Note he is on chronic anticoagulation because of recurrent DVT and pulmonary embolus. When the patient was admitted for atrial fibrillation he had upper respiratory infection. He noticed his atrial fibrillation by lack of energy and increased dyspnea. At last office visit I felt upper respiratory infection may have contributed to atrial fibrillation and did not want to keep him on long-term amiodarone. We therefore discontinued this medication. Since last seen,  Current Outpatient Prescriptions  Medication Sig Dispense Refill  . Biotin 5000 MCG CAPS Take by mouth.    . carvedilol (COREG) 25 MG tablet Take 1 tablet (25 mg total) by mouth 2 (two) times daily with a meal. 60 tablet 2  . Cholecalciferol (VITAMIN D3) 5000 UNITS TABS Take by mouth 2 (two) times daily between meals.    . Coenzyme Q10 (COQ-10 PO) Take by mouth.    . Cyanocobalamin (B-12 SL) Place under the tongue.    . diltiazem (CARDIZEM CD) 120 MG 24 hr capsule Take 1 capsule (120 mg total) by mouth daily. 90 capsule 1  . GLUCOSAMINE CHONDROITIN COMPLX PO Take by mouth. Pt takes 1500-1288mg  per pt.    . losartan (COZAAR) 50 MG tablet Take 1 tablet (50 mg total) by mouth daily. 90 tablet 3  . meloxicam (MOBIC) 15 MG tablet TAKE ONE TABLET BY MOUTH ONCE DAILY 30 tablet 0  . methocarbamol (ROBAXIN) 750 MG tablet Take 1 tablet (750 mg total) by mouth every 8 (eight) hours as needed for muscle spasms. 90 tablet 5  . Multiple Vitamin  (MULTIVITAMIN) capsule Take 1 capsule by mouth daily.    Marland Kitchen oxyCODONE-acetaminophen (PERCOCET) 10-325 MG per tablet Take 1 tablet by mouth every 6 (six) hours as needed for pain. 76 tablet 0  . pravastatin (PRAVACHOL) 40 MG tablet TAKE ONE TABLET (40 MG TOTAL) BY MOUTH ONCE DAILY 90 tablet 0  . Probiotic Product (PROBIOTIC DAILY PO) Take by mouth.    . temazepam (RESTORIL) 15 MG capsule Take 1 capsule (15 mg total) by mouth at bedtime as needed for sleep. 90 capsule 1  . testosterone cypionate (DEPOTESTOTERONE CYPIONATE) 200 MG/ML injection 0.78mL IM every other week. 10 mL   . warfarin (COUMADIN) 1 MG tablet     . warfarin (COUMADIN) 7.5 MG tablet Take 1 tablet (7.5 mg total) by mouth daily. Based on most recent INR 30 tablet 1   No current facility-administered medications for this visit.     Past Medical History  Diagnosis Date  . Hypertension   . High cholesterol   . Congestive heart failure (CHF) ([place])   . CAD (coronary artery disease)     Stent placed; [place]  . Atrial fibrillation ([place])   . Pulmonary embolus ([place])   . DVT (deep vein thrombosis) in pregnancy     Past Surgical History  Procedure Laterality Date  . Lamenectomy    . Left orchiectomy    . Hernia repair    . Ivc  2014    Social History   Social History  .  Marital Status: Married    Spouse Name: N/A  . Number of Children: 5  . Years of Education: N/A   Occupational History  . Not on file.   Social History Main Topics  . Smoking status: Former Smoker    Quit date: 08/10/1999  . Smokeless tobacco: Not on file  . Alcohol Use: 0.0 oz/week    0 Standard drinks or equivalent per week     Comment: Rare  . Drug Use: No  . Sexual Activity:    Partners: Female   Other Topics Concern  . Not on file   Social History Narrative    Family History  Problem Relation Age of Onset  . Thyroid cancer Mother   . Depression Mother   . High Cholesterol Father   . High blood pressure Father   .  Stroke      Grandfather  . Heart disease Father     Atrial fibrillation    ROS: no fevers or chills, productive cough, hemoptysis, dysphasia, odynophagia, melena, hematochezia, dysuria, hematuria, rash, seizure activity, orthopnea, PND, pedal edema, claudication. Remaining systems are negative.  Physical Exam: Well-developed well-nourished in no acute distress.  Skin is warm and dry.  HEENT is normal.  Neck is supple.  Chest is clear to auscultation with normal expansion.  Cardiovascular exam is regular rate and rhythm.  Abdominal exam nontender or distended. No masses palpated. Extremities show no edema. neuro grossly intact  Assessment and plan 1 coronary artery disease-continue statin. No aspirin given need for anticoagulation. 2 hypertension-blood pressure elevated. Increase Cozaar to 100 mg daily. Check potassium and renal function in 1 week. 3 hyperlipidemia-continue statin. 4 paroxysmal atrial fibrillation-patient remains in sinus rhythm on examination. Continue metoprolol and Cardizem for rate control if atrial fibrillation recurs. Continue Coumadin. 5 history of recurrent pulmonary emboli-continue Coumadin. INR is monitored by primary care. Kirk Ruths, MD

## 2015-10-19 ENCOUNTER — Encounter: Payer: Self-pay | Admitting: Cardiology

## 2015-10-19 ENCOUNTER — Ambulatory Visit (INDEPENDENT_AMBULATORY_CARE_PROVIDER_SITE_OTHER): Payer: Medicare Other | Admitting: Cardiology

## 2015-10-19 VITALS — BP 160/113 | HR 85 | Ht 74.0 in | Wt 289.1 lb

## 2015-10-19 DIAGNOSIS — M545 Low back pain: Secondary | ICD-10-CM | POA: Diagnosis not present

## 2015-10-19 DIAGNOSIS — M961 Postlaminectomy syndrome, not elsewhere classified: Secondary | ICD-10-CM | POA: Diagnosis not present

## 2015-10-19 DIAGNOSIS — I480 Paroxysmal atrial fibrillation: Secondary | ICD-10-CM

## 2015-10-19 DIAGNOSIS — I10 Essential (primary) hypertension: Secondary | ICD-10-CM | POA: Diagnosis not present

## 2015-10-19 DIAGNOSIS — I4891 Unspecified atrial fibrillation: Secondary | ICD-10-CM

## 2015-10-19 DIAGNOSIS — E785 Hyperlipidemia, unspecified: Secondary | ICD-10-CM

## 2015-10-19 DIAGNOSIS — I2510 Atherosclerotic heart disease of native coronary artery without angina pectoris: Secondary | ICD-10-CM | POA: Diagnosis not present

## 2015-10-19 MED ORDER — LOSARTAN POTASSIUM 100 MG PO TABS
100.0000 mg | ORAL_TABLET | Freq: Every day | ORAL | Status: DC
Start: 2015-10-19 — End: 2016-06-08

## 2015-10-19 NOTE — Patient Instructions (Signed)
Medication Instructions:   INCREASE LOSARTAN TO 50 MG ONCE DAILY= 2 OF THE 50 MG TABLETS ONCE DAILY  Labwork:  Your physician recommends that you return for lab work in:ONE WEEK  Follow-Up:  Your physician wants you to follow-up in: [place] will receive a reminder letter in the mail two months in advance. If you don't receive a letter, please call our office to schedule the follow-up appointment.   If you need a refill on your cardiac medications before your next appointment, please call your pharmacy.

## 2015-11-01 ENCOUNTER — Ambulatory Visit (INDEPENDENT_AMBULATORY_CARE_PROVIDER_SITE_OTHER): Payer: Medicare Other | Admitting: Family Medicine

## 2015-11-01 VITALS — BP 156/86 | HR 68

## 2015-11-01 DIAGNOSIS — I2699 Other pulmonary embolism without acute cor pulmonale: Secondary | ICD-10-CM | POA: Diagnosis not present

## 2015-11-01 DIAGNOSIS — Z7901 Long term (current) use of anticoagulants: Secondary | ICD-10-CM | POA: Diagnosis not present

## 2015-11-01 LAB — POCT INR: INR: 3.8

## 2015-11-01 NOTE — Progress Notes (Signed)
Recommendations left on vm 

## 2015-11-01 NOTE — Progress Notes (Signed)
Will you please let patient know that his INR was elevated today but just a few points above the goal of 2.5-3.5.  I'd recommend he have this rechecked in one week with no change to his coumadin 7.5mg  daily dose, I'd like to see if it comes back to normal before making a big change.

## 2015-11-02 NOTE — Progress Notes (Signed)
Pt advised, appt scheduled for recheck next week.

## 2015-11-04 DIAGNOSIS — M9905 Segmental and somatic dysfunction of pelvic region: Secondary | ICD-10-CM | POA: Diagnosis not present

## 2015-11-04 DIAGNOSIS — M5106 Intervertebral disc disorders with myelopathy, lumbar region: Secondary | ICD-10-CM | POA: Diagnosis not present

## 2015-11-04 DIAGNOSIS — M546 Pain in thoracic spine: Secondary | ICD-10-CM | POA: Diagnosis not present

## 2015-11-04 DIAGNOSIS — M5432 Sciatica, left side: Secondary | ICD-10-CM | POA: Diagnosis not present

## 2015-11-04 DIAGNOSIS — M9904 Segmental and somatic dysfunction of sacral region: Secondary | ICD-10-CM | POA: Diagnosis not present

## 2015-11-04 DIAGNOSIS — M62552 Muscle wasting and atrophy, not elsewhere classified, left thigh: Secondary | ICD-10-CM | POA: Diagnosis not present

## 2015-11-04 DIAGNOSIS — M9902 Segmental and somatic dysfunction of thoracic region: Secondary | ICD-10-CM | POA: Diagnosis not present

## 2015-11-04 DIAGNOSIS — M9903 Segmental and somatic dysfunction of lumbar region: Secondary | ICD-10-CM | POA: Diagnosis not present

## 2015-11-05 DIAGNOSIS — M9904 Segmental and somatic dysfunction of sacral region: Secondary | ICD-10-CM | POA: Diagnosis not present

## 2015-11-05 DIAGNOSIS — M62552 Muscle wasting and atrophy, not elsewhere classified, left thigh: Secondary | ICD-10-CM | POA: Diagnosis not present

## 2015-11-05 DIAGNOSIS — M546 Pain in thoracic spine: Secondary | ICD-10-CM | POA: Diagnosis not present

## 2015-11-05 DIAGNOSIS — M9902 Segmental and somatic dysfunction of thoracic region: Secondary | ICD-10-CM | POA: Diagnosis not present

## 2015-11-05 DIAGNOSIS — M5106 Intervertebral disc disorders with myelopathy, lumbar region: Secondary | ICD-10-CM | POA: Diagnosis not present

## 2015-11-05 DIAGNOSIS — M5432 Sciatica, left side: Secondary | ICD-10-CM | POA: Diagnosis not present

## 2015-11-05 DIAGNOSIS — M9903 Segmental and somatic dysfunction of lumbar region: Secondary | ICD-10-CM | POA: Diagnosis not present

## 2015-11-05 DIAGNOSIS — M9905 Segmental and somatic dysfunction of pelvic region: Secondary | ICD-10-CM | POA: Diagnosis not present

## 2015-11-07 ENCOUNTER — Ambulatory Visit: Payer: Medicare Other

## 2015-11-08 DIAGNOSIS — M62552 Muscle wasting and atrophy, not elsewhere classified, left thigh: Secondary | ICD-10-CM | POA: Diagnosis not present

## 2015-11-08 DIAGNOSIS — M9902 Segmental and somatic dysfunction of thoracic region: Secondary | ICD-10-CM | POA: Diagnosis not present

## 2015-11-08 DIAGNOSIS — M9903 Segmental and somatic dysfunction of lumbar region: Secondary | ICD-10-CM | POA: Diagnosis not present

## 2015-11-08 DIAGNOSIS — M546 Pain in thoracic spine: Secondary | ICD-10-CM | POA: Diagnosis not present

## 2015-11-08 DIAGNOSIS — M9905 Segmental and somatic dysfunction of pelvic region: Secondary | ICD-10-CM | POA: Diagnosis not present

## 2015-11-08 DIAGNOSIS — M5432 Sciatica, left side: Secondary | ICD-10-CM | POA: Diagnosis not present

## 2015-11-08 DIAGNOSIS — M9904 Segmental and somatic dysfunction of sacral region: Secondary | ICD-10-CM | POA: Diagnosis not present

## 2015-11-08 DIAGNOSIS — M5106 Intervertebral disc disorders with myelopathy, lumbar region: Secondary | ICD-10-CM | POA: Diagnosis not present

## 2015-11-11 DIAGNOSIS — M9903 Segmental and somatic dysfunction of lumbar region: Secondary | ICD-10-CM | POA: Diagnosis not present

## 2015-11-11 DIAGNOSIS — M5106 Intervertebral disc disorders with myelopathy, lumbar region: Secondary | ICD-10-CM | POA: Diagnosis not present

## 2015-11-11 DIAGNOSIS — M9902 Segmental and somatic dysfunction of thoracic region: Secondary | ICD-10-CM | POA: Diagnosis not present

## 2015-11-11 DIAGNOSIS — M9905 Segmental and somatic dysfunction of pelvic region: Secondary | ICD-10-CM | POA: Diagnosis not present

## 2015-11-11 DIAGNOSIS — M9904 Segmental and somatic dysfunction of sacral region: Secondary | ICD-10-CM | POA: Diagnosis not present

## 2015-11-11 DIAGNOSIS — M5432 Sciatica, left side: Secondary | ICD-10-CM | POA: Diagnosis not present

## 2015-11-11 DIAGNOSIS — M62552 Muscle wasting and atrophy, not elsewhere classified, left thigh: Secondary | ICD-10-CM | POA: Diagnosis not present

## 2015-11-11 DIAGNOSIS — M546 Pain in thoracic spine: Secondary | ICD-10-CM | POA: Diagnosis not present

## 2015-11-16 ENCOUNTER — Ambulatory Visit: Payer: Medicare Other | Admitting: Physical Therapy

## 2015-11-17 ENCOUNTER — Other Ambulatory Visit: Payer: Self-pay | Admitting: Family Medicine

## 2015-11-17 DIAGNOSIS — M549 Dorsalgia, unspecified: Secondary | ICD-10-CM

## 2015-11-21 ENCOUNTER — Encounter: Payer: Medicare Other | Admitting: Physical Therapy

## 2015-11-22 ENCOUNTER — Ambulatory Visit: Payer: Medicare Other | Admitting: Physical Therapy

## 2015-11-23 ENCOUNTER — Encounter: Payer: Medicare Other | Admitting: Physical Therapy

## 2015-11-23 DIAGNOSIS — M961 Postlaminectomy syndrome, not elsewhere classified: Secondary | ICD-10-CM | POA: Diagnosis not present

## 2015-11-23 DIAGNOSIS — Z79899 Other long term (current) drug therapy: Secondary | ICD-10-CM | POA: Diagnosis not present

## 2015-11-23 DIAGNOSIS — Z79891 Long term (current) use of opiate analgesic: Secondary | ICD-10-CM | POA: Diagnosis not present

## 2015-11-23 DIAGNOSIS — G894 Chronic pain syndrome: Secondary | ICD-10-CM | POA: Diagnosis not present

## 2015-11-23 DIAGNOSIS — M545 Low back pain: Secondary | ICD-10-CM | POA: Diagnosis not present

## 2015-12-01 ENCOUNTER — Telehealth: Payer: Self-pay | Admitting: Family Medicine

## 2015-12-01 ENCOUNTER — Other Ambulatory Visit: Payer: Self-pay | Admitting: Family Medicine

## 2015-12-01 NOTE — Telephone Encounter (Signed)
Refilled sent.

## 2015-12-01 NOTE — Telephone Encounter (Signed)
Patient walked-in and adv he is put of his coumadin and request to have it called into [facility] on [address] today he asked in the next 2 hrs if possible I adv pt of our policy concerning refills. Thanks

## 2015-12-02 DIAGNOSIS — M5442 Lumbago with sciatica, left side: Secondary | ICD-10-CM | POA: Diagnosis not present

## 2015-12-02 DIAGNOSIS — M5416 Radiculopathy, lumbar region: Secondary | ICD-10-CM | POA: Diagnosis not present

## 2015-12-02 DIAGNOSIS — I10 Essential (primary) hypertension: Secondary | ICD-10-CM | POA: Diagnosis not present

## 2015-12-02 DIAGNOSIS — M5432 Sciatica, left side: Secondary | ICD-10-CM | POA: Diagnosis not present

## 2015-12-05 ENCOUNTER — Other Ambulatory Visit: Payer: Self-pay | Admitting: Family Medicine

## 2015-12-09 ENCOUNTER — Other Ambulatory Visit: Payer: Self-pay | Admitting: Family Medicine

## 2015-12-13 ENCOUNTER — Encounter: Payer: Self-pay | Admitting: *Deleted

## 2015-12-27 ENCOUNTER — Other Ambulatory Visit: Payer: Self-pay | Admitting: Family Medicine

## 2016-01-02 DIAGNOSIS — M961 Postlaminectomy syndrome, not elsewhere classified: Secondary | ICD-10-CM | POA: Diagnosis not present

## 2016-01-02 DIAGNOSIS — M545 Low back pain: Secondary | ICD-10-CM | POA: Diagnosis not present

## 2016-01-03 DIAGNOSIS — I2510 Atherosclerotic heart disease of native coronary artery without angina pectoris: Secondary | ICD-10-CM | POA: Diagnosis not present

## 2016-01-04 LAB — BASIC METABOLIC PANEL
BUN: 23 mg/dL (ref 7–25)
CHLORIDE: 103 mmol/L (ref 98–110)
CO2: 25 mmol/L (ref 20–31)
Calcium: 10.2 mg/dL (ref 8.6–10.3)
Creat: 0.95 mg/dL (ref 0.70–1.33)
Glucose, Bld: 160 mg/dL — ABNORMAL HIGH (ref 65–99)
POTASSIUM: 4.5 mmol/L (ref 3.5–5.3)
Sodium: 139 mmol/L (ref 135–146)

## 2016-01-11 ENCOUNTER — Ambulatory Visit (INDEPENDENT_AMBULATORY_CARE_PROVIDER_SITE_OTHER): Payer: Medicare Other | Admitting: Sports Medicine

## 2016-01-11 DIAGNOSIS — E291 Testicular hypofunction: Secondary | ICD-10-CM | POA: Diagnosis not present

## 2016-01-11 DIAGNOSIS — E785 Hyperlipidemia, unspecified: Secondary | ICD-10-CM

## 2016-01-11 DIAGNOSIS — I2510 Atherosclerotic heart disease of native coronary artery without angina pectoris: Secondary | ICD-10-CM | POA: Diagnosis not present

## 2016-01-11 DIAGNOSIS — I10 Essential (primary) hypertension: Secondary | ICD-10-CM

## 2016-01-11 DIAGNOSIS — L918 Other hypertrophic disorders of the skin: Secondary | ICD-10-CM | POA: Diagnosis not present

## 2016-01-11 DIAGNOSIS — I2699 Other pulmonary embolism without acute cor pulmonale: Secondary | ICD-10-CM

## 2016-01-11 DIAGNOSIS — R7309 Other abnormal glucose: Secondary | ICD-10-CM | POA: Diagnosis not present

## 2016-01-11 MED ORDER — METOPROLOL TARTRATE 50 MG PO TABS: 50.0000 mg | ORAL_TABLET | Freq: Two times a day (BID) | ORAL | 3 refills | Status: DC

## 2016-01-11 MED ORDER — APIXABAN 5 MG PO TABS: 5.0000 mg | ORAL_TABLET | Freq: Two times a day (BID) | ORAL | 11 refills | Status: DC

## 2016-01-11 MED ORDER — TESTOSTERONE CYPIONATE 200 MG/ML IM SOLN: INTRAMUSCULAR | 3 refills | Status: DC

## 2016-01-11 NOTE — Assessment & Plan Note (Signed)
Switching to Eliquis. Discontinue Coumadin.

## 2016-01-11 NOTE — Assessment & Plan Note (Signed)
Cryotherapy as above, was located on the upper inside lip in the mouth.

## 2016-01-11 NOTE — Assessment & Plan Note (Addendum)
We will keep an eye on this, blood pressure is okay today. Ultimately we may switch from losartan to valsartan, losartan has a very short duration of action.

## 2016-01-11 NOTE — Progress Notes (Signed)
  Subjective:    CC: Multiple issues  HPI: Hypertension: Elevated, no headaches, visual changes, chest pain.  Hyperlipidemia: Stable.  Prediabetes: Stable.  History of PEs: Currently on Coumadin, agreeable to switch to one of the newer oral anticoagulants.  History of paroxysmal atrial fibrillation: Currently on carvedilol and diltiazem. Gets severe nausea with diltiazem and and desires to switch back to metoprolol which seemed to work well in the past.  Skin tag: Oral, desires cryotherapy  Male hypogonadism: Stable, needs refill on testosterone  Past medical history, Surgical history, Family history not pertinant except as noted below, Social history, Allergies, and medications have been entered into the medical record, reviewed, and no changes needed.   Review of Systems: No fevers, chills, night sweats, weight loss, chest pain, or shortness of breath.   Objective:    General: Well Developed, well nourished, and in no acute distress.  Neuro: Alert and oriented x3, extra-ocular muscles intact, sensation grossly intact.  HEENT: Normocephalic, atraumatic, pupils equal round reactive to light, neck supple, no masses, no lymphadenopathy, thyroid nonpalpable. nasopharynx, ear canals unremarkable, skin tag on the upper lip Skin: Warm and dry, no rashes. Cardiac: Regular rate and rhythm, no murmurs rubs or gallops, no lower extremity edema.  Respiratory: Clear to auscultation bilaterally. Not using accessory muscles, speaking in full sentences.  Procedure:  Cryodestruction of  Consent obtained and verified. Time-out conducted. Noted no overlying erythema, induration, or other signs of local infection. Completed without difficulty using Cryo-Gun. Advised to call if fevers/chills, erythema, induration, drainage, or persistent bleeding.  Impression and Recommendations:    Hyperlipidemia Rechecking lipids  Hypogonadism in male Refilling testosterone with needles.  Essential  hypertension We will keep an eye on this, blood pressure is okay today. Ultimately we may switch from losartan to valsartan, losartan has a very short duration of action.  Recurrent pulmonary emboli Switching to Eliquis. Discontinue Coumadin.  Skin tag Cryotherapy as above, was located on the upper inside lip in the mouth.  I spent 40 minutes with this patient, greater than 50% was face-to-face time counseling regarding the above diagnoses

## 2016-01-11 NOTE — Assessment & Plan Note (Signed)
Refilling testosterone with needles.

## 2016-01-11 NOTE — Assessment & Plan Note (Addendum)
Rechecking lipids.  Lipids are crazy high, switching to atorvastatin he can take it at night and he needs to push through the myalgias.Marland Kitchen

## 2016-01-12 LAB — CBC
HCT: 49.7 % (ref 38.5–50.0)
Hemoglobin: 17 g/dL (ref 13.2–17.1)
MCH: 32.9 pg (ref 27.0–33.0)
MCHC: 34.2 g/dL (ref 32.0–36.0)
MCV: 96.1 fL (ref 80.0–100.0)
MPV: 13.7 fL — ABNORMAL HIGH (ref 7.5–12.5)
Platelets: 215 10*3/uL (ref 140–400)
RBC: 5.17 MIL/uL (ref 4.20–5.80)
RDW: 14.5 % (ref 11.0–15.0)
WBC: 11.5 10*3/uL — ABNORMAL HIGH (ref 3.8–10.8)

## 2016-01-12 LAB — HEMOGLOBIN A1C
Hgb A1c MFr Bld: 7.3 % — ABNORMAL HIGH (ref <5.7)
Mean Plasma Glucose: 163 mg/dL

## 2016-01-12 LAB — COMPREHENSIVE METABOLIC PANEL
ALT: 34 U/L (ref 9–46)
AST: 21 U/L (ref 10–35)
BUN: 23 mg/dL (ref 7–25)
CO2: 25 mmol/L (ref 20–31)
Creat: 1 mg/dL (ref 0.70–1.33)
Glucose, Bld: 169 mg/dL — ABNORMAL HIGH (ref 65–99)
Sodium: 133 mmol/L — ABNORMAL LOW (ref 135–146)

## 2016-01-12 LAB — COMPREHENSIVE METABOLIC PANEL WITH GFR
Albumin: 4.3 g/dL (ref 3.6–5.1)
Alkaline Phosphatase: 42 U/L (ref 40–115)
Calcium: 10.3 mg/dL (ref 8.6–10.3)
Chloride: 96 mmol/L — ABNORMAL LOW (ref 98–110)
Potassium: 4.5 mmol/L (ref 3.5–5.3)
Total Bilirubin: 0.7 mg/dL (ref 0.2–1.2)
Total Protein: 6.8 g/dL (ref 6.1–8.1)

## 2016-01-12 LAB — LIPID PANEL
Cholesterol: 252 mg/dL — ABNORMAL HIGH (ref 125–200)
HDL: 70 mg/dL (ref >=40)
LDL Cholesterol: 138 mg/dL — ABNORMAL HIGH (ref <130)
Total CHOL/HDL Ratio: 3.6 Ratio (ref <=5.0)
Triglycerides: 222 mg/dL — ABNORMAL HIGH (ref <150)
VLDL: 44 mg/dL — ABNORMAL HIGH (ref <30)

## 2016-01-12 LAB — TSH: TSH: 2.07 m[IU]/L (ref 0.40–4.50)

## 2016-01-12 MED ORDER — ATORVASTATIN CALCIUM 80 MG PO TABS: 80.0000 mg | ORAL_TABLET | Freq: Every day | ORAL | 3 refills | Status: DC

## 2016-01-12 NOTE — Addendum Note (Signed)
Addended by: Silverio Decamp on: 01/12/2016 10:15 AM   Modules accepted: Orders

## 2016-01-13 ENCOUNTER — Telehealth: Payer: Self-pay | Admitting: Sports Medicine

## 2016-01-13 ENCOUNTER — Telehealth: Payer: Self-pay

## 2016-01-13 MED ORDER — "HYPODERMIC NEEDLE 22G X 1-1/2"" MISC": 3 refills | Status: DC

## 2016-01-13 MED ORDER — SYRINGE (DISPOSABLE) 1 ML MISC: 3 refills | Status: DC

## 2016-01-13 MED ORDER — SYRINGE 22G X 1" 3 ML MISC
11 refills | Status: DC
Start: 2016-01-13 — End: 2021-03-21

## 2016-01-13 MED ORDER — "NEEDLE (DISP) 18G X 1-1/2"" MISC": 11 refills | Status: DC

## 2016-01-13 MED ORDER — RIVAROXABAN 20 MG PO TABS: 20.0000 mg | ORAL_TABLET | Freq: Every day | ORAL | 3 refills | Status: DC

## 2016-01-13 MED ORDER — "BLUNT NEEDLE 18G X 2"" MISC": 3 refills | Status: DC

## 2016-01-13 NOTE — Telephone Encounter (Signed)
Jeff Green called and states the eliquis with insurance is 200 dollars a month. He would like to stay with coumadin.

## 2016-01-13 NOTE — Telephone Encounter (Signed)
Pt came in and stated that he went to pick up the eliquis but it was over 200 dollars and he cant afford that so he was wondering if there was another medication he can try another medication or just stay on the coumadin. Also, he stated the pharmacy told him he needs a prescription for the syringe and needles for his T shots that he does at home. He said they are 1ml 22g 1in. and 18G  1 1/2 in. Thanks

## 2016-01-13 NOTE — Telephone Encounter (Signed)
Switching to Xarelto.  Also sending in a prescription for the needles.  If still too expensive we will switch to Pradaxa.

## 2016-01-13 NOTE — Telephone Encounter (Signed)
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

## 2016-01-16 NOTE — Telephone Encounter (Signed)
Patient advised of and recommendations. He will call back if the new medication cost too much.

## 2016-01-17 ENCOUNTER — Other Ambulatory Visit: Payer: Self-pay | Admitting: Family Medicine

## 2016-01-19 ENCOUNTER — Ambulatory Visit (INDEPENDENT_AMBULATORY_CARE_PROVIDER_SITE_OTHER): Payer: Medicare Other | Admitting: Sports Medicine

## 2016-01-19 ENCOUNTER — Other Ambulatory Visit: Payer: Self-pay | Admitting: *Deleted

## 2016-01-19 VITALS — BP 154/90 | HR 66

## 2016-01-19 DIAGNOSIS — I2699 Other pulmonary embolism without acute cor pulmonale: Secondary | ICD-10-CM

## 2016-01-19 LAB — POCT INR: INR: 2.7

## 2016-01-19 MED ORDER — WARFARIN SODIUM 7.5 MG PO TABS: 7.5000 mg | ORAL_TABLET | Freq: Every day | ORAL | 1 refills | Status: DC

## 2016-01-19 NOTE — Progress Notes (Signed)
INR checked per [employer] Patient will not be taking Xarelto, he wants to continue with the older agents.

## 2016-01-19 NOTE — Progress Notes (Signed)
Pt.notified

## 2016-01-20 ENCOUNTER — Other Ambulatory Visit: Payer: Self-pay | Admitting: Family Medicine

## 2016-01-20 DIAGNOSIS — M47816 Spondylosis without myelopathy or radiculopathy, lumbar region: Secondary | ICD-10-CM | POA: Diagnosis not present

## 2016-01-20 DIAGNOSIS — M4316 Spondylolisthesis, lumbar region: Secondary | ICD-10-CM | POA: Diagnosis not present

## 2016-01-20 DIAGNOSIS — M5126 Other intervertebral disc displacement, lumbar region: Secondary | ICD-10-CM | POA: Diagnosis not present

## 2016-01-20 DIAGNOSIS — M4310 Spondylolisthesis, site unspecified: Secondary | ICD-10-CM | POA: Diagnosis not present

## 2016-01-20 DIAGNOSIS — M4806 Spinal stenosis, lumbar region: Secondary | ICD-10-CM | POA: Diagnosis not present

## 2016-01-20 DIAGNOSIS — M47817 Spondylosis without myelopathy or radiculopathy, lumbosacral region: Secondary | ICD-10-CM | POA: Diagnosis not present

## 2016-01-20 DIAGNOSIS — Z9889 Other specified postprocedural states: Secondary | ICD-10-CM | POA: Diagnosis not present

## 2016-01-20 DIAGNOSIS — Z86711 Personal history of pulmonary embolism: Secondary | ICD-10-CM | POA: Diagnosis not present

## 2016-01-20 DIAGNOSIS — Z86718 Personal history of other venous thrombosis and embolism: Secondary | ICD-10-CM | POA: Diagnosis not present

## 2016-01-20 DIAGNOSIS — C61 Malignant neoplasm of prostate: Secondary | ICD-10-CM | POA: Diagnosis not present

## 2016-01-20 DIAGNOSIS — M419 Scoliosis, unspecified: Secondary | ICD-10-CM | POA: Diagnosis not present

## 2016-01-20 DIAGNOSIS — Z7901 Long term (current) use of anticoagulants: Secondary | ICD-10-CM | POA: Diagnosis not present

## 2016-01-20 DIAGNOSIS — M5136 Other intervertebral disc degeneration, lumbar region: Secondary | ICD-10-CM | POA: Diagnosis not present

## 2016-01-25 ENCOUNTER — Ambulatory Visit (INDEPENDENT_AMBULATORY_CARE_PROVIDER_SITE_OTHER): Payer: Medicare Other | Admitting: Sports Medicine

## 2016-01-25 ENCOUNTER — Other Ambulatory Visit: Payer: Self-pay

## 2016-01-25 DIAGNOSIS — E785 Hyperlipidemia, unspecified: Secondary | ICD-10-CM | POA: Diagnosis not present

## 2016-01-25 DIAGNOSIS — Z0000 Encounter for general adult medical examination without abnormal findings: Secondary | ICD-10-CM | POA: Insufficient documentation

## 2016-01-25 DIAGNOSIS — I2510 Atherosclerotic heart disease of native coronary artery without angina pectoris: Secondary | ICD-10-CM | POA: Diagnosis not present

## 2016-01-25 DIAGNOSIS — I480 Paroxysmal atrial fibrillation: Secondary | ICD-10-CM | POA: Diagnosis not present

## 2016-01-25 DIAGNOSIS — I10 Essential (primary) hypertension: Secondary | ICD-10-CM

## 2016-01-25 DIAGNOSIS — E291 Testicular hypofunction: Secondary | ICD-10-CM

## 2016-01-25 MED ORDER — WARFARIN SODIUM 7.5 MG PO TABS: 7.5000 mg | ORAL_TABLET | Freq: Every day | ORAL | 1 refills | Status: DC

## 2016-01-25 NOTE — Assessment & Plan Note (Signed)
Recently increased to atorvastatin 80, recheck lipids in 2-1/2 months.

## 2016-01-25 NOTE — Assessment & Plan Note (Signed)
Recheck testosterone next week.

## 2016-01-25 NOTE — Assessment & Plan Note (Signed)
Looking at a single level decompression, he will need to bridge with Lovenox when the surgery date comes.

## 2016-01-25 NOTE — Progress Notes (Signed)
  Subjective:    CC: Follow-up  HPI: Hypertension: Well controlled  Atrial fibrillation: Back on Coumadin, doing well.  Hyperlipidemia: Doing well on new dose of atorvastatin  Hypogonadism: Needs testosterone rechecked next week.  Lumbar spinal stenosis: Has a decompressive procedure coming up.  Past medical history:  Negative.  See flowsheet/record as well for more information.  Surgical history: Negative.  See flowsheet/record as well for more information.  Family history: Negative.  See flowsheet/record as well for more information.  Social history: Negative.  See flowsheet/record as well for more information.  Allergies, and medications have been entered into the medical record, reviewed, and no changes needed.   Review of Systems: No fevers, chills, night sweats, weight loss, chest pain, or shortness of breath.   Objective:    General: Well Developed, well nourished, and in no acute distress.  Neuro: Alert and oriented x3, extra-ocular muscles intact, sensation grossly intact.  HEENT: Normocephalic, atraumatic, pupils equal round reactive to light, neck supple, no masses, no lymphadenopathy, thyroid nonpalpable.  Skin: Warm and dry, no rashes. Cardiac: Regular rate and rhythm, no murmurs rubs or gallops, no lower extremity edema.  Respiratory: Clear to auscultation bilaterally. Not using accessory muscles, speaking in full sentences.   Impression and Recommendations:    Hyperlipidemia  Recently increased to atorvastatin 80, recheck lipids in 2-1/2 months.  Hypogonadism in male  Recheck testosterone next week.  Essential hypertension Well-controlled with the switch to metoprolol. No changes, return as needed.  Spinal stenosis of lumbar region Looking at a single level decompression, he will need to bridge with Lovenox when the surgery date comes.  Paroxysmal atrial fibrillation (HCC) Continue Coumadin for now.  Annual physical exam HIV and hepatitis C  screening

## 2016-01-25 NOTE — Assessment & Plan Note (Signed)
Well-controlled with the switch to metoprolol. No changes, return as needed.

## 2016-01-25 NOTE — Assessment & Plan Note (Signed)
Continue Coumadin for now.

## 2016-01-25 NOTE — Assessment & Plan Note (Signed)
HIV and hepatitis C screening

## 2016-01-26 ENCOUNTER — Telehealth: Payer: Self-pay

## 2016-01-26 NOTE — Telephone Encounter (Signed)
Pt notified and is scheduled for tomorrow

## 2016-01-26 NOTE — Telephone Encounter (Signed)
Pt reports that he his having ear pain.  He stated that when he blew his nose he noticed some clear fluids coming out of his right ear.  You seen pt yesterday but he said these Sx didn't start til today. Please advise.

## 2016-01-26 NOTE — Telephone Encounter (Signed)
Probably needs to be seen, may have ruptured his tympanic membrane. I can't tell without looking at him.

## 2016-01-27 ENCOUNTER — Ambulatory Visit (INDEPENDENT_AMBULATORY_CARE_PROVIDER_SITE_OTHER): Payer: Medicare Other | Admitting: Sports Medicine

## 2016-01-27 ENCOUNTER — Other Ambulatory Visit: Payer: Self-pay | Admitting: Family Medicine

## 2016-01-27 ENCOUNTER — Encounter: Payer: Self-pay | Admitting: Sports Medicine

## 2016-01-27 DIAGNOSIS — H7293 Unspecified perforation of tympanic membrane, bilateral: Secondary | ICD-10-CM | POA: Diagnosis not present

## 2016-01-27 DIAGNOSIS — I2510 Atherosclerotic heart disease of native coronary artery without angina pectoris: Secondary | ICD-10-CM | POA: Diagnosis not present

## 2016-01-27 MED ORDER — PREDNISONE 50 MG PO TABS: 50.0000 mg | ORAL_TABLET | Freq: Every day | ORAL | 0 refills | Status: DC

## 2016-01-27 MED ORDER — AZITHROMYCIN 250 MG PO TABS: ORAL_TABLET | ORAL | 0 refills | Status: DC

## 2016-01-27 MED ORDER — CIPROFLOXACIN-DEXAMETHASONE 0.3-0.1 % OT SUSP: 4.0000 [drp] | Freq: Two times a day (BID) | OTIC | 3 refills | Status: DC

## 2016-01-27 NOTE — Assessment & Plan Note (Addendum)
With external canal blood, clot it. Physician curettage to remove the clot with instrumentation. Referral to ENT. Ciprodex drops. Also with symptoms consistent with acute maxillary sinusitis, prednisone, azithromycin. Return as needed.

## 2016-01-27 NOTE — Progress Notes (Signed)
  Subjective:    CC: Follow-up  HPI: Several days ago this pleasant 60 year old male was blowing his nose, he felt a pop in both ears and some blood trickling out. He now has sinus pain and pressure as well as difficulty hearing, a buzzing sound, and occasional blood trickling from his ears. Symptoms are moderate, persistent.  Past medical history:  Negative.  See flowsheet/record as well for more information.  Surgical history: Negative.  See flowsheet/record as well for more information.  Family history: Negative.  See flowsheet/record as well for more information.  Social history: Negative.  See flowsheet/record as well for more information.  Allergies, and medications have been entered into the medical record, reviewed, and no changes needed.   Review of Systems: No fevers, chills, night sweats, weight loss, chest pain, or shortness of breath.   Objective:    General: Well Developed, well nourished, and in no acute distress.  Neuro: Alert and oriented x3, extra-ocular muscles intact, sensation grossly intact.  HEENT: Normocephalic, atraumatic, pupils equal round reactive to light, neck supple, no masses, no lymphadenopathy, thyroid nonpalpable. Oropharynx and nasopharynx are unremarkable, there is bilateral tympanic membrane ruptures with clotted blood in the external meatus. Skin: Warm and dry, no rashes. Cardiac: Regular rate and rhythm, no murmurs rubs or gallops, no lower extremity edema.  Respiratory: Clear to auscultation bilaterally. Not using accessory muscles, speaking in full sentences.  Indication: Bloody cerumen impaction of both ear(s) Medical necessity statement: On physical examination, cerumen impairs clinically significant portions of the external auditory canal, and tympanic membrane. Noted obstructive, copious bloody cerumen that cannot be removed without magnification and instrumentations requiring physician skills Consent: Discussed benefits and risks of procedure  and verbal consent obtained Procedure: Patient was prepped for the procedure. Utilized an otoscope to assess and take note of the ear canal, the tympanic membrane, and the presence, amount, and placement of the cerumen. Gentle water irrigation and soft plastic curette was utilized to remove cerumen.  Post procedure examination: shows cerumen was completely removed. Patient tolerated procedure well. The patient is made aware that they may experience temporary vertigo, temporary hearing loss, and temporary discomfort. If these symptom last for more than 24 hours to call the clinic or proceed to the ED.  Impression and Recommendations:    Ruptured tympanic membrane, bilateral With external canal blood, clot it. Physician curettage to remove the clot with instrumentation. Referral to ENT. Ciprodex drops. Also with symptoms consistent with acute maxillary sinusitis, prednisone, azithromycin. Return as needed.

## 2016-01-28 DIAGNOSIS — H66013 Acute suppurative otitis media with spontaneous rupture of ear drum, bilateral: Secondary | ICD-10-CM | POA: Diagnosis not present

## 2016-01-28 DIAGNOSIS — Z87891 Personal history of nicotine dependence: Secondary | ICD-10-CM | POA: Diagnosis not present

## 2016-01-28 DIAGNOSIS — H7293 Unspecified perforation of tympanic membrane, bilateral: Secondary | ICD-10-CM | POA: Diagnosis not present

## 2016-01-28 DIAGNOSIS — I10 Essential (primary) hypertension: Secondary | ICD-10-CM | POA: Diagnosis not present

## 2016-01-28 DIAGNOSIS — Z888 Allergy status to other drugs, medicaments and biological substances status: Secondary | ICD-10-CM | POA: Diagnosis not present

## 2016-01-28 DIAGNOSIS — Z86711 Personal history of pulmonary embolism: Secondary | ICD-10-CM | POA: Diagnosis not present

## 2016-01-28 DIAGNOSIS — H9203 Otalgia, bilateral: Secondary | ICD-10-CM | POA: Diagnosis not present

## 2016-01-28 DIAGNOSIS — Z79899 Other long term (current) drug therapy: Secondary | ICD-10-CM | POA: Diagnosis not present

## 2016-01-28 DIAGNOSIS — R0981 Nasal congestion: Secondary | ICD-10-CM | POA: Diagnosis not present

## 2016-01-28 DIAGNOSIS — J029 Acute pharyngitis, unspecified: Secondary | ICD-10-CM | POA: Diagnosis not present

## 2016-01-28 DIAGNOSIS — I4891 Unspecified atrial fibrillation: Secondary | ICD-10-CM | POA: Diagnosis not present

## 2016-01-28 DIAGNOSIS — Z7901 Long term (current) use of anticoagulants: Secondary | ICD-10-CM | POA: Diagnosis not present

## 2016-01-28 DIAGNOSIS — E7800 Pure hypercholesterolemia, unspecified: Secondary | ICD-10-CM | POA: Diagnosis not present

## 2016-01-30 DIAGNOSIS — Z79891 Long term (current) use of opiate analgesic: Secondary | ICD-10-CM | POA: Diagnosis not present

## 2016-01-30 DIAGNOSIS — M79606 Pain in leg, unspecified: Secondary | ICD-10-CM | POA: Diagnosis not present

## 2016-01-30 DIAGNOSIS — G894 Chronic pain syndrome: Secondary | ICD-10-CM | POA: Diagnosis not present

## 2016-01-30 DIAGNOSIS — M961 Postlaminectomy syndrome, not elsewhere classified: Secondary | ICD-10-CM | POA: Diagnosis not present

## 2016-01-30 DIAGNOSIS — M545 Low back pain: Secondary | ICD-10-CM | POA: Diagnosis not present

## 2016-01-30 DIAGNOSIS — Z79899 Other long term (current) drug therapy: Secondary | ICD-10-CM | POA: Diagnosis not present

## 2016-02-01 DIAGNOSIS — Z0000 Encounter for general adult medical examination without abnormal findings: Secondary | ICD-10-CM | POA: Diagnosis not present

## 2016-02-01 DIAGNOSIS — E291 Testicular hypofunction: Secondary | ICD-10-CM | POA: Diagnosis not present

## 2016-02-02 ENCOUNTER — Telehealth: Payer: Self-pay

## 2016-02-02 ENCOUNTER — Telehealth: Payer: Self-pay | Admitting: Cardiology

## 2016-02-02 LAB — TESTOSTERONE TOTAL,FREE,BIO, MALES
Albumin: 4 g/dL (ref 3.6–5.1)
Sex Hormone Binding: 20 nmol/L — ABNORMAL LOW (ref 22–77)
Testosterone, Bioavailable: 107.5 ng/dL — ABNORMAL LOW (ref 110.0–575.0)
Testosterone, Free: 58.5 pg/mL (ref 46.0–224.0)
Testosterone: 302 ng/dL (ref 250–827)

## 2016-02-02 LAB — HIV ANTIBODY (ROUTINE TESTING W REFLEX): HIV 1&2 Ab, 4th Generation: NONREACTIVE

## 2016-02-02 LAB — HEPATITIS C ANTIBODY: HCV Ab: NEGATIVE

## 2016-02-02 MED ORDER — ENOXAPARIN SODIUM 30 MG/0.3ML ~~LOC~~ SOLN: 30.0000 mg | Freq: Two times a day (BID) | SUBCUTANEOUS | 0 refills | Status: DC

## 2016-02-02 NOTE — Telephone Encounter (Signed)
Pt is scheduled for back surgery on Saturday. Pt need cardiac clearance for this,he was last seen by Dr Stanford Breed in June 2017.Will pt need to be seen before surgery? If not, he needs clearance for Dr Gloriann Loan at [facility] and [place] does not know Dr Purvis Sheffield first name. The phone number is [phone].

## 2016-02-02 NOTE — Telephone Encounter (Signed)
Stop Coumadin 5 days before surgery, and start Lovenox 30 mg subcutaneous twice a day, he will continue Lovenox until the day before surgery, and restart one day postop with his Coumadin, and we will continue until he is therapeutic again.

## 2016-02-02 NOTE — Telephone Encounter (Signed)
Faxed this note to Dr. Purvis Sheffield office. Called & left msg for patient advising of clearance and that fax notification was sent, wished him well w procedure.

## 2016-02-02 NOTE — Telephone Encounter (Signed)
Jeff Green is scheduled for back surgery on Wednesday October 18 th with Dr Gloriann Loan. He was advised he needs to stop coumadin and bridge with Lovenox. Dr Purvis Sheffield office is wanting Dr Dianah Field to handle the stop of coumadin and start of the Lovenox. Please advise. Lovenox will need to go to [facility] in [place].

## 2016-02-02 NOTE — Telephone Encounter (Signed)
Patient advised and medication sent to [facility].

## 2016-02-02 NOTE — Telephone Encounter (Signed)
[place] for surgery from cardiac standpoint Kirk Ruths

## 2016-02-02 NOTE — Telephone Encounter (Signed)
Patient followed up at an office visit.

## 2016-02-02 NOTE — Telephone Encounter (Signed)
Spoke to patient. He has already gotten coumadin hold and lovenox bridging instructions from PCP. I called Dr. Purvis Sheffield office to get fax number.  Patient seen in June. Aware I will route request for clearance to Dr. Stanford Breed - if any problems we will notify. Otherwise will send approval for clearance to requestion provider.  Request for surgical clearance:  1. What type of surgery is being performed? Bilateral lumbar laminectomy L2-L5  2. When is this surgery scheduled?  Sat 10/14  3. Are there any medications that need to be held prior to surgery and how long? Coumadin - already holding and starting lovenox bridge  4. Name of physician performing surgery? Dr. Gloriann Loan  5. What is your office phone and fax number? Fax [phone]

## 2016-02-03 ENCOUNTER — Other Ambulatory Visit: Payer: Self-pay | Admitting: Sports Medicine

## 2016-02-08 DIAGNOSIS — M48062 Spinal stenosis, lumbar region with neurogenic claudication: Secondary | ICD-10-CM | POA: Diagnosis not present

## 2016-02-08 DIAGNOSIS — Z7901 Long term (current) use of anticoagulants: Secondary | ICD-10-CM | POA: Diagnosis not present

## 2016-02-08 DIAGNOSIS — Z6837 Body mass index (BMI) 37.0-37.9, adult: Secondary | ICD-10-CM | POA: Diagnosis not present

## 2016-02-08 DIAGNOSIS — M5136 Other intervertebral disc degeneration, lumbar region: Secondary | ICD-10-CM | POA: Diagnosis not present

## 2016-02-08 DIAGNOSIS — E669 Obesity, unspecified: Secondary | ICD-10-CM | POA: Diagnosis not present

## 2016-02-08 DIAGNOSIS — M4316 Spondylolisthesis, lumbar region: Secondary | ICD-10-CM | POA: Diagnosis not present

## 2016-02-08 DIAGNOSIS — Z79899 Other long term (current) drug therapy: Secondary | ICD-10-CM | POA: Diagnosis not present

## 2016-02-08 DIAGNOSIS — M48061 Spinal stenosis, lumbar region without neurogenic claudication: Secondary | ICD-10-CM | POA: Diagnosis not present

## 2016-02-08 DIAGNOSIS — I2510 Atherosclerotic heart disease of native coronary artery without angina pectoris: Secondary | ICD-10-CM | POA: Diagnosis not present

## 2016-02-08 DIAGNOSIS — E785 Hyperlipidemia, unspecified: Secondary | ICD-10-CM | POA: Diagnosis not present

## 2016-02-08 DIAGNOSIS — I10 Essential (primary) hypertension: Secondary | ICD-10-CM | POA: Diagnosis not present

## 2016-02-08 DIAGNOSIS — Z955 Presence of coronary angioplasty implant and graft: Secondary | ICD-10-CM | POA: Diagnosis not present

## 2016-02-08 DIAGNOSIS — Z888 Allergy status to other drugs, medicaments and biological substances status: Secondary | ICD-10-CM | POA: Diagnosis not present

## 2016-02-09 DIAGNOSIS — I10 Essential (primary) hypertension: Secondary | ICD-10-CM | POA: Diagnosis not present

## 2016-02-09 DIAGNOSIS — I2510 Atherosclerotic heart disease of native coronary artery without angina pectoris: Secondary | ICD-10-CM | POA: Diagnosis not present

## 2016-02-09 DIAGNOSIS — M48061 Spinal stenosis, lumbar region without neurogenic claudication: Secondary | ICD-10-CM | POA: Diagnosis not present

## 2016-02-09 DIAGNOSIS — E669 Obesity, unspecified: Secondary | ICD-10-CM | POA: Diagnosis not present

## 2016-02-09 DIAGNOSIS — E785 Hyperlipidemia, unspecified: Secondary | ICD-10-CM | POA: Diagnosis not present

## 2016-02-09 DIAGNOSIS — Z955 Presence of coronary angioplasty implant and graft: Secondary | ICD-10-CM | POA: Diagnosis not present

## 2016-02-10 DIAGNOSIS — Z955 Presence of coronary angioplasty implant and graft: Secondary | ICD-10-CM | POA: Diagnosis not present

## 2016-02-10 DIAGNOSIS — I10 Essential (primary) hypertension: Secondary | ICD-10-CM | POA: Diagnosis not present

## 2016-02-10 DIAGNOSIS — E785 Hyperlipidemia, unspecified: Secondary | ICD-10-CM | POA: Diagnosis not present

## 2016-02-10 DIAGNOSIS — I2510 Atherosclerotic heart disease of native coronary artery without angina pectoris: Secondary | ICD-10-CM | POA: Diagnosis not present

## 2016-02-10 DIAGNOSIS — M48061 Spinal stenosis, lumbar region without neurogenic claudication: Secondary | ICD-10-CM | POA: Diagnosis not present

## 2016-02-10 DIAGNOSIS — E669 Obesity, unspecified: Secondary | ICD-10-CM | POA: Diagnosis not present

## 2016-02-20 ENCOUNTER — Ambulatory Visit: Payer: Medicare Other | Admitting: Sports Medicine

## 2016-02-20 LAB — POCT INR: INR: 3.2

## 2016-02-20 NOTE — Progress Notes (Signed)
Pt advised of dosage change, will contact clinic to schedule.

## 2016-02-24 DIAGNOSIS — H9223 Otorrhagia, bilateral: Secondary | ICD-10-CM | POA: Diagnosis not present

## 2016-02-28 DIAGNOSIS — Z79891 Long term (current) use of opiate analgesic: Secondary | ICD-10-CM | POA: Diagnosis not present

## 2016-02-28 DIAGNOSIS — G894 Chronic pain syndrome: Secondary | ICD-10-CM | POA: Diagnosis not present

## 2016-02-28 DIAGNOSIS — M79606 Pain in leg, unspecified: Secondary | ICD-10-CM | POA: Diagnosis not present

## 2016-02-28 DIAGNOSIS — Z79899 Other long term (current) drug therapy: Secondary | ICD-10-CM | POA: Diagnosis not present

## 2016-02-28 DIAGNOSIS — M545 Low back pain: Secondary | ICD-10-CM | POA: Diagnosis not present

## 2016-02-28 DIAGNOSIS — M961 Postlaminectomy syndrome, not elsewhere classified: Secondary | ICD-10-CM | POA: Diagnosis not present

## 2016-03-09 DIAGNOSIS — M48062 Spinal stenosis, lumbar region with neurogenic claudication: Secondary | ICD-10-CM | POA: Diagnosis not present

## 2016-03-22 ENCOUNTER — Ambulatory Visit (INDEPENDENT_AMBULATORY_CARE_PROVIDER_SITE_OTHER): Payer: Medicare Other | Admitting: Sports Medicine

## 2016-03-22 VITALS — BP 127/82 | HR 73

## 2016-03-22 DIAGNOSIS — I480 Paroxysmal atrial fibrillation: Secondary | ICD-10-CM

## 2016-03-22 DIAGNOSIS — I2699 Other pulmonary embolism without acute cor pulmonale: Secondary | ICD-10-CM

## 2016-03-22 DIAGNOSIS — I2510 Atherosclerotic heart disease of native coronary artery without angina pectoris: Secondary | ICD-10-CM | POA: Diagnosis not present

## 2016-03-22 LAB — POCT INR: INR: 2.5

## 2016-03-22 NOTE — Progress Notes (Signed)
   Patient's had some questions about a rash he is developing, he's used Coumadin in the past without any issues, more recently was placed on a fentanyl patch by his pain doctor. He woke up this morning with blood blisters on his arms. Does not recall scratching, or trauma. No nasal bleeding, melena, hematochezia, hematemesis. No headaches, visual changes, chest pain. No presyncope or fatigue.

## 2016-03-22 NOTE — Progress Notes (Signed)
LMOM notifying pt to return in 2 weeks for INR check.

## 2016-03-27 DIAGNOSIS — Z79891 Long term (current) use of opiate analgesic: Secondary | ICD-10-CM | POA: Diagnosis not present

## 2016-03-27 DIAGNOSIS — M961 Postlaminectomy syndrome, not elsewhere classified: Secondary | ICD-10-CM | POA: Diagnosis not present

## 2016-03-27 DIAGNOSIS — M79606 Pain in leg, unspecified: Secondary | ICD-10-CM | POA: Diagnosis not present

## 2016-03-27 DIAGNOSIS — G894 Chronic pain syndrome: Secondary | ICD-10-CM | POA: Diagnosis not present

## 2016-03-27 DIAGNOSIS — M545 Low back pain: Secondary | ICD-10-CM | POA: Diagnosis not present

## 2016-03-27 DIAGNOSIS — Z79899 Other long term (current) drug therapy: Secondary | ICD-10-CM | POA: Diagnosis not present

## 2016-04-04 DIAGNOSIS — M961 Postlaminectomy syndrome, not elsewhere classified: Secondary | ICD-10-CM | POA: Diagnosis not present

## 2016-04-04 DIAGNOSIS — Z79891 Long term (current) use of opiate analgesic: Secondary | ICD-10-CM | POA: Diagnosis not present

## 2016-04-04 DIAGNOSIS — Z79899 Other long term (current) drug therapy: Secondary | ICD-10-CM | POA: Diagnosis not present

## 2016-04-04 DIAGNOSIS — G894 Chronic pain syndrome: Secondary | ICD-10-CM | POA: Diagnosis not present

## 2016-04-04 DIAGNOSIS — M79606 Pain in leg, unspecified: Secondary | ICD-10-CM | POA: Diagnosis not present

## 2016-04-04 DIAGNOSIS — M545 Low back pain: Secondary | ICD-10-CM | POA: Diagnosis not present

## 2016-05-02 DIAGNOSIS — G894 Chronic pain syndrome: Secondary | ICD-10-CM | POA: Diagnosis not present

## 2016-05-02 DIAGNOSIS — Z79891 Long term (current) use of opiate analgesic: Secondary | ICD-10-CM | POA: Diagnosis not present

## 2016-05-02 DIAGNOSIS — M79606 Pain in leg, unspecified: Secondary | ICD-10-CM | POA: Diagnosis not present

## 2016-05-02 DIAGNOSIS — M961 Postlaminectomy syndrome, not elsewhere classified: Secondary | ICD-10-CM | POA: Diagnosis not present

## 2016-05-02 DIAGNOSIS — Z79899 Other long term (current) drug therapy: Secondary | ICD-10-CM | POA: Diagnosis not present

## 2016-05-02 DIAGNOSIS — M545 Low back pain: Secondary | ICD-10-CM | POA: Diagnosis not present

## 2016-05-08 ENCOUNTER — Other Ambulatory Visit: Payer: Self-pay | Admitting: Sports Medicine

## 2016-05-17 ENCOUNTER — Other Ambulatory Visit: Payer: Self-pay

## 2016-05-17 DIAGNOSIS — I10 Essential (primary) hypertension: Secondary | ICD-10-CM

## 2016-05-17 MED ORDER — METOPROLOL TARTRATE 50 MG PO TABS: 50.0000 mg | ORAL_TABLET | Freq: Two times a day (BID) | ORAL | 3 refills | Status: DC

## 2016-05-31 DIAGNOSIS — Z79899 Other long term (current) drug therapy: Secondary | ICD-10-CM | POA: Diagnosis not present

## 2016-05-31 DIAGNOSIS — M545 Low back pain: Secondary | ICD-10-CM | POA: Diagnosis not present

## 2016-05-31 DIAGNOSIS — M79606 Pain in leg, unspecified: Secondary | ICD-10-CM | POA: Diagnosis not present

## 2016-05-31 DIAGNOSIS — G894 Chronic pain syndrome: Secondary | ICD-10-CM | POA: Diagnosis not present

## 2016-05-31 DIAGNOSIS — M961 Postlaminectomy syndrome, not elsewhere classified: Secondary | ICD-10-CM | POA: Diagnosis not present

## 2016-05-31 DIAGNOSIS — Z79891 Long term (current) use of opiate analgesic: Secondary | ICD-10-CM | POA: Diagnosis not present

## 2016-06-04 ENCOUNTER — Other Ambulatory Visit: Payer: Self-pay

## 2016-06-04 MED ORDER — WARFARIN SODIUM 7.5 MG PO TABS: 7.5000 mg | ORAL_TABLET | Freq: Every day | ORAL | 0 refills | Status: DC

## 2016-06-08 ENCOUNTER — Ambulatory Visit: Payer: Medicare Other

## 2016-06-08 ENCOUNTER — Ambulatory Visit (INDEPENDENT_AMBULATORY_CARE_PROVIDER_SITE_OTHER): Payer: Medicare Other | Admitting: Sports Medicine

## 2016-06-08 VITALS — BP 138/71 | HR 72 | Resp 16 | Wt 317.6 lb

## 2016-06-08 DIAGNOSIS — M48061 Spinal stenosis, lumbar region without neurogenic claudication: Secondary | ICD-10-CM

## 2016-06-08 DIAGNOSIS — I480 Paroxysmal atrial fibrillation: Secondary | ICD-10-CM

## 2016-06-08 DIAGNOSIS — E119 Type 2 diabetes mellitus without complications: Secondary | ICD-10-CM

## 2016-06-08 DIAGNOSIS — L219 Seborrheic dermatitis, unspecified: Secondary | ICD-10-CM

## 2016-06-08 DIAGNOSIS — E785 Hyperlipidemia, unspecified: Secondary | ICD-10-CM | POA: Diagnosis not present

## 2016-06-08 DIAGNOSIS — I10 Essential (primary) hypertension: Secondary | ICD-10-CM | POA: Diagnosis not present

## 2016-06-08 DIAGNOSIS — Z23 Encounter for immunization: Secondary | ICD-10-CM | POA: Diagnosis not present

## 2016-06-08 LAB — POCT INR: INR: 2

## 2016-06-08 MED ORDER — TRIAMCINOLONE ACETONIDE 0.1 % EX CREA: 1.0000 "application " | TOPICAL_CREAM | Freq: Two times a day (BID) | CUTANEOUS | 0 refills | Status: DC

## 2016-06-08 MED ORDER — ROSUVASTATIN CALCIUM 40 MG PO TABS: 40.0000 mg | ORAL_TABLET | Freq: Every day | ORAL | 3 refills | Status: DC

## 2016-06-08 MED ORDER — VALSARTAN-HYDROCHLOROTHIAZIDE 320-25 MG PO TABS: 0.5000 | ORAL_TABLET | Freq: Every day | ORAL | 3 refills | Status: DC

## 2016-06-08 NOTE — Progress Notes (Signed)
  Subjective:    CC: Follow-up  HPI: Atrial fibrillation: Here for a Coumadin check.  He also has multiple other issues including obesity, hypertension, hyperlipidemia, and more recently diabetes mellitus type 2. We increased his Lipitor to 80 mg, having some myalgias and would like to switch to something else. In addition he has a reddish, itchy, rash on his face and forehead that bothers him when he is in the shower. He wonders if this is his gabapentin, however his back is doing extremely well after his lumbar surgery, and he is not eager to discontinue it. He is agreeable to simply try cream for his face.   Past medical history:  Negative.  See flowsheet/record as well for more information.  Surgical history: Negative.  See flowsheet/record as well for more information.  Family history: Negative.  See flowsheet/record as well for more information.  Social history: Negative.  See flowsheet/record as well for more information.  Allergies, and medications have been entered into the medical record, reviewed, and no changes needed.   Review of Systems: No fevers, chills, night sweats, weight loss, chest pain, or shortness of breath.   Objective:    General: Well Developed, well nourished, and in no acute distress.  Neuro: Alert and oriented x3, extra-ocular muscles intact, sensation grossly intact.  HEENT: Normocephalic, atraumatic, pupils equal round reactive to light, neck supple, no masses, no lymphadenopathy, thyroid nonpalpable.  Skin: Warm and dry, Visible seborrheic dermatitis in the classic distribution on the face and forehead, nasolabial folds, and eyebrows Cardiac: Regular rate and rhythm, no murmurs rubs or gallops, 1+ lower extremity edema.  Respiratory: Clear to auscultation bilaterally. Not using accessory muscles, speaking in full sentences.  Impression and Recommendations:    Diabetes mellitus type 2, diet-controlled ([place]) We will simply keep and I on this, we will also  catch him up on the appropriate screening and preventive measures. Pneumococcal vaccine today.  Essential hypertension Still somewhat high today, switching to valsartan/HCTZ.  Hyperlipidemia Myalgias with atorvastatin, switching to Crestor.  Spinal stenosis of lumbar region Back is now doing great after single level decompression.  Seborrheic dermatitis Adding topical triamcinolone cream. He did think that this was secondary to his gabapentin, I do think he should continue gabapentin for now, and simply use the cream.  Morbid obesity [facility]) Referral for bariatric surgery. We could potentially cure his diabetes, hypertension, hyperlipidemia.  I spent 40 minutes with this patient, greater than 50% was face-to-face time counseling regarding the above diagnoses

## 2016-06-08 NOTE — Assessment & Plan Note (Signed)
Adding topical triamcinolone cream. He did think that this was secondary to his gabapentin, I do think he should continue gabapentin for now, and simply use the cream.

## 2016-06-08 NOTE — Assessment & Plan Note (Signed)
Referral for bariatric surgery. We could potentially cure his diabetes, hypertension, hyperlipidemia.

## 2016-06-08 NOTE — Assessment & Plan Note (Signed)
Myalgias with atorvastatin, switching to Crestor.

## 2016-06-08 NOTE — Assessment & Plan Note (Signed)
Still somewhat high today, switching to valsartan/HCTZ.

## 2016-06-08 NOTE — Assessment & Plan Note (Signed)
We will simply keep and I on this, we will also catch him up on the appropriate screening and preventive measures. Pneumococcal vaccine today.

## 2016-06-08 NOTE — Assessment & Plan Note (Signed)
Back is now doing great after single level decompression.

## 2016-06-22 ENCOUNTER — Ambulatory Visit (INDEPENDENT_AMBULATORY_CARE_PROVIDER_SITE_OTHER): Payer: Medicare Other | Admitting: Sports Medicine

## 2016-06-22 ENCOUNTER — Encounter: Payer: Self-pay | Admitting: Sports Medicine

## 2016-06-22 VITALS — BP 124/76 | HR 80 | Wt 321.0 lb

## 2016-06-22 DIAGNOSIS — L219 Seborrheic dermatitis, unspecified: Secondary | ICD-10-CM

## 2016-06-22 DIAGNOSIS — I480 Paroxysmal atrial fibrillation: Secondary | ICD-10-CM

## 2016-06-22 DIAGNOSIS — E785 Hyperlipidemia, unspecified: Secondary | ICD-10-CM

## 2016-06-22 DIAGNOSIS — I10 Essential (primary) hypertension: Secondary | ICD-10-CM | POA: Diagnosis not present

## 2016-06-22 DIAGNOSIS — E119 Type 2 diabetes mellitus without complications: Secondary | ICD-10-CM

## 2016-06-22 LAB — POCT INR: INR: 2.5

## 2016-06-22 NOTE — Assessment & Plan Note (Addendum)
Seborrheic dermatitis has resolved with topical triamcinolone cream.

## 2016-06-22 NOTE — Progress Notes (Signed)
  Subjective:    CC: Follow-up   HPI: Hypertension: Well controlled  Seborrheic dermatitis: Controlled  Diabetes mellitus type 2: Controlled  Hyperlipidemia: Myalgias with Lipitor, switched to Crestor, no issues.  Morbid obesity: Agrees to proceed with bariatric surgery, we did a referral last time for some reason he never got a phone call.  Past medical history:  Negative.  See flowsheet/record as well for more information.  Surgical history: Negative.  See flowsheet/record as well for more information.  Family history: Negative.  See flowsheet/record as well for more information.  Social history: Negative.  See flowsheet/record as well for more information.  Allergies, and medications have been entered into the medical record, reviewed, and no changes needed.   Review of Systems: No fevers, chills, night sweats, weight loss, chest pain, or shortness of breath.   Objective:    General: Well Developed, well nourished, and in no acute distress.  Neuro: Alert and oriented x3, extra-ocular muscles intact, sensation grossly intact.  HEENT: Normocephalic, atraumatic, pupils equal round reactive to light, neck supple, no masses, no lymphadenopathy, thyroid nonpalpable.  Skin: Warm and dry, no rashes. Cardiac: Regular rate and rhythm, no murmurs rubs or gallops, no lower extremity edema.  Respiratory: Clear to auscultation bilaterally. Not using accessory muscles, speaking in full sentences.  Diabetic Foot Exam Both feet were examined, there are no signs of ulceration or abnormal callus. Nails are unremarkable. Dorsalis pedis and posterior tibial pulses are palpable. Sensation is intact to sharp and monofilament. Shoes are of appropriate fitment.  Impression and Recommendations:    Essential hypertension Well-controlled on current regimen and one half of a valsartan/HCTZ tablet daily.  Hyperlipidemia Switched to Crestor at the last visit, got myalgias with atorvastatin, no  adverse effects with Crestor, we will recheck his lipids in 2 months.  Morbid obesity ([place]) Doing another referral to bariatric surgery, he never got a call back.  Diabetes mellitus type 2, diet-controlled ([place]) Overall well controlled, foot exam done today. Does have some diabetic peripheral neuropathy that is moderately controlled with gabapentin.  Seborrheic dermatitis Seborrheic dermatitis has resolved with topical triamcinolone cream.

## 2016-06-22 NOTE — Assessment & Plan Note (Signed)
Doing another referral to bariatric surgery, he never got a call back.

## 2016-06-22 NOTE — Assessment & Plan Note (Signed)
Well-controlled on current regimen and one half of a valsartan/HCTZ tablet daily.

## 2016-06-22 NOTE — Assessment & Plan Note (Signed)
Overall well controlled, foot exam done today. Does have some diabetic peripheral neuropathy that is moderately controlled with gabapentin.

## 2016-06-22 NOTE — Assessment & Plan Note (Signed)
Switched to Crestor at the last visit, got myalgias with atorvastatin, no adverse effects with Crestor, we will recheck his lipids in 2 months.

## 2016-06-28 DIAGNOSIS — G894 Chronic pain syndrome: Secondary | ICD-10-CM | POA: Diagnosis not present

## 2016-06-28 DIAGNOSIS — Z79891 Long term (current) use of opiate analgesic: Secondary | ICD-10-CM | POA: Diagnosis not present

## 2016-06-28 DIAGNOSIS — M545 Low back pain: Secondary | ICD-10-CM | POA: Diagnosis not present

## 2016-06-28 DIAGNOSIS — M79606 Pain in leg, unspecified: Secondary | ICD-10-CM | POA: Diagnosis not present

## 2016-06-28 DIAGNOSIS — Z79899 Other long term (current) drug therapy: Secondary | ICD-10-CM | POA: Diagnosis not present

## 2016-06-28 DIAGNOSIS — M961 Postlaminectomy syndrome, not elsewhere classified: Secondary | ICD-10-CM | POA: Diagnosis not present

## 2016-07-04 ENCOUNTER — Other Ambulatory Visit: Payer: Self-pay | Admitting: Sports Medicine

## 2016-07-19 ENCOUNTER — Telehealth: Payer: Self-pay

## 2016-07-19 ENCOUNTER — Ambulatory Visit: Payer: Medicare Other

## 2016-07-19 ENCOUNTER — Ambulatory Visit (INDEPENDENT_AMBULATORY_CARE_PROVIDER_SITE_OTHER): Payer: Medicare Other | Admitting: Sports Medicine

## 2016-07-19 DIAGNOSIS — I480 Paroxysmal atrial fibrillation: Secondary | ICD-10-CM

## 2016-07-19 LAB — POCT INR: INR: 2.7

## 2016-07-19 MED ORDER — DILTIAZEM HCL ER COATED BEADS 120 MG PO CP24: 120.0000 mg | ORAL_CAPSULE | Freq: Every day | ORAL | 1 refills | Status: DC

## 2016-07-19 NOTE — Telephone Encounter (Signed)
OK to refill

## 2016-07-19 NOTE — Telephone Encounter (Signed)
Pt is requesting a refill on Cartia XT 120mg .  Please advise.

## 2016-07-19 NOTE — Progress Notes (Signed)
   Subjective:    Patient ID: Jeff Green, male    DOB: 09-01-1955, 61 y.o.   MRN: [id number]  HPI   Review of Systems     Objective:   Physical Exam        Assessment & Plan:

## 2016-07-19 NOTE — Telephone Encounter (Signed)
Sent, notified pt

## 2016-08-02 ENCOUNTER — Other Ambulatory Visit: Payer: Self-pay | Admitting: Sports Medicine

## 2016-08-08 ENCOUNTER — Other Ambulatory Visit: Payer: Self-pay

## 2016-08-08 MED ORDER — CARVEDILOL 25 MG PO TABS: 25.0000 mg | ORAL_TABLET | Freq: Two times a day (BID) | ORAL | 0 refills | Status: DC

## 2016-08-16 ENCOUNTER — Ambulatory Visit (INDEPENDENT_AMBULATORY_CARE_PROVIDER_SITE_OTHER): Payer: Medicare Other | Admitting: Family Medicine

## 2016-08-16 DIAGNOSIS — I480 Paroxysmal atrial fibrillation: Secondary | ICD-10-CM

## 2016-08-16 LAB — POCT INR: INR: 3

## 2016-08-16 NOTE — Progress Notes (Signed)
Left detailed vm advising pt of recommendations.

## 2016-08-23 DIAGNOSIS — Z79899 Other long term (current) drug therapy: Secondary | ICD-10-CM | POA: Diagnosis not present

## 2016-08-23 DIAGNOSIS — M961 Postlaminectomy syndrome, not elsewhere classified: Secondary | ICD-10-CM | POA: Diagnosis not present

## 2016-08-23 DIAGNOSIS — G894 Chronic pain syndrome: Secondary | ICD-10-CM | POA: Diagnosis not present

## 2016-08-23 DIAGNOSIS — M79606 Pain in leg, unspecified: Secondary | ICD-10-CM | POA: Diagnosis not present

## 2016-08-23 DIAGNOSIS — Z79891 Long term (current) use of opiate analgesic: Secondary | ICD-10-CM | POA: Diagnosis not present

## 2016-08-23 DIAGNOSIS — M545 Low back pain: Secondary | ICD-10-CM | POA: Diagnosis not present

## 2016-08-31 ENCOUNTER — Encounter: Payer: Self-pay | Admitting: Rehabilitative and Restorative Service Providers"

## 2016-08-31 ENCOUNTER — Ambulatory Visit (INDEPENDENT_AMBULATORY_CARE_PROVIDER_SITE_OTHER): Payer: Medicare Other | Admitting: Rehabilitative and Restorative Service Providers"

## 2016-08-31 DIAGNOSIS — M6281 Muscle weakness (generalized): Secondary | ICD-10-CM | POA: Diagnosis not present

## 2016-08-31 DIAGNOSIS — M545 Low back pain, unspecified: Secondary | ICD-10-CM

## 2016-08-31 DIAGNOSIS — R2689 Other abnormalities of gait and mobility: Secondary | ICD-10-CM | POA: Diagnosis not present

## 2016-08-31 DIAGNOSIS — G8929 Other chronic pain: Secondary | ICD-10-CM

## 2016-08-31 NOTE — Patient Instructions (Signed)
KNEE: Quadriceps - Prone    Place strap around ankle. Bring ankle toward buttocks. Press hip into surface. Hold _30__ seconds. _2-3__ reps per set, __2_ sets per day, __7_ days per week  Hamstring Step 1   Place foot in strap and straighten  knee. Keeping leg straight lift leg until a stretch is felt. Keep opposite knee bent. Hold __30_ seconds. Relax knee by returning foot to start. Repeat _2-3__ times.  Outer Hip Stretch: Reclined IT Band Stretch (Strap)   Strap around one foot, pull leg across body until you feel a pull or stretch, with shoulders on mat. Hold for 30 seconds. Repeat 3 times each leg. 2-3 times/day.  Piriformis Stretch   Lying on back, pull right knee toward opposite shoulder. Hold 30 seconds. Repeat 3 times. Do 2-3 sessions per day.  TENS UNIT: This is helpful for muscle pain and spasm.   Search and Purchase a TENS 7000 2nd edition at www.tenspros.com. It should be less than $30.  Or [employer].com for ~$25    TENS unit instructions: Do not shower or bathe with the unit on Turn the unit off before removing electrodes or batteries If the electrodes lose stickiness add a drop of water to the electrodes after they are disconnected from the unit and place on plastic sheet. If you continued to have difficulty, call the TENS unit company to purchase more electrodes. Do not apply lotion on the skin area prior to use. Make sure the skin is clean and dry as this will help prolong the life of the electrodes. After use, always check skin for unusual red areas, rash or other skin difficulties. If there are any skin problems, does not apply electrodes to the same area. Never remove the electrodes from the unit by pulling the wires. Do not use the TENS unit or electrodes other than as directed. Do not change electrode placement without consultating your therapist or physician. Keep 2 fingers with between each electrode. Wear time ratio is 2:1, on to off times.    For  example on for 30 minutes off for 15 minutes and then on for 30 minutes off for 15 minutes  [facility] [address] [place] [address] [place], [place] [address]  [phone] (office) [phone] (fax)

## 2016-08-31 NOTE — Therapy (Signed)
[place], [place], [address] Phone: [phone]   Fax:  [phone]  Physical Therapy Evaluation  Patient Details  Name: Jeff Green MRN: [id number] Date of Birth: Sep 15, 1955 Referring Provider: Vanice Sarah, PA-C   Encounter Date: 08/31/2016      PT End of Session - 08/31/16 1013    Visit Number 1   Number of Visits 12   Date for PT Re-Evaluation 10/12/16   PT Start Time 0937   PT Stop Time 1035   PT Time Calculation (min) 58 min   Activity Tolerance Patient tolerated treatment well      Past Medical History:  Diagnosis Date  . Atrial fibrillation ([place])   . CAD (coronary artery disease)    Stent placed; [place]  . Congestive heart failure (CHF) ([place])   . DVT (deep vein thrombosis) in pregnancy ([place])   . High cholesterol   . Hypertension   . Pulmonary embolus [facility])     Past Surgical History:  Procedure Laterality Date  . HERNIA REPAIR    . IVC  2014  . lamenectomy    . left orchiectomy      There were no vitals filed for this visit.       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 Had some PT but continued to have intermittent LBP and problems. He went on disability ~ 15 yrs ago. He had some nerve damage Lt LE as well as numbness in legs. Underwent lumbar surgery for lumbar stenosis 02/08/16. He has improved following surgery.  He had flare up of symtpoms with plane trip and lay over in airport and drive for 6 hours 82/99. He has continued pain and problems since December.    Pertinent History Back injury 1988; bilat knee pain; cardiac stent ~5 yrs ago    How long can you sit comfortably? not at all   How long can you stand comfortably? 5 min    How long can you walk comfortably? 5-10 min    Diagnostic tests MRI Xray     Patient Stated Goals to strengthen back and come out feeling a whole lot better; to be able to walk comfortably    Currently in Pain? Yes   Pain Score 5    Pain Location Back   Pain Orientation Lower;Right;Left;Posterior   Pain Descriptors / Indicators Nagging;Sharp   Pain Type Chronic pain   Pain Radiating Towards into tail bone    Pain Onset More than a month ago   Pain Frequency Constant   Aggravating Factors  prolonged sitting; standing; walking    Pain Relieving Factors topical cream; ice            [facility] PT Assessment - 08/31/16 0001      Assessment   Medical Diagnosis LBP    Referring Provider Legrand Como Roche, PA-C    Onset Date/Surgical Date 02/08/16   Hand Dominance Right   Next MD Visit 09/20/16   Prior Therapy yes      Precautions   Precautions None     Balance Screen   Has the patient fallen in the past 6 months Yes   How many times? 3   Has the patient had a decrease in activity level because of a fear of falling?  No   Is the patient  reluctant to leave their home because of a fear of falling?  No     Home Environment   Additional Comments multilevel apartment enters through stairs      Prior Function   Level of Independence Independent with basic ADLs   Vocation On disability;Retired   Customer service manager    Leisure walking; shopping; some light household activities     Observation/Other Assessments   Focus on Therapeutic Outcomes (FOTO)  66% limitation      Sensation   Additional Comments some tingling in feet on an intermittent basis since just before surgery - varies day to day      Posture/Postural Control   Posture Comments head forward; shoudlers rounded and elevated; wide based stance; flexed forward at hips; knees hyperextended; LE's in ER      AROM   Lumbar Flexion --  unable to maintain balance to bend forward    Lumbar Extension 35%  extension fingers touching wall    Lumbar - Right Side Bend --  unable to perform     Lumbar - Left Side Bend --  unable to perform    Lumbar - Right Rotation 20%  fingers touching wall    Lumbar - Left Rotation 20%  fingers touching wall      Strength   Right Hip Flexion 4/5   Right Hip Extension 4-/5   Right Hip ABduction 4-/5   Left Hip Flexion 4+/5   Left Hip Extension 4/5   Left Hip ABduction 4+/5   Right Knee Flexion 4+/5   Right Knee Extension --  5-/5   Left Knee Flexion 4+/5   Left Knee Extension 5/5   Right Ankle Dorsiflexion 4-/5   Right Ankle Plantar Flexion 3/5  unable to lift on toes    Left Ankle Dorsiflexion 4/5   Left Ankle Plantar Flexion 3/5  unable to lift on toes      Flexibility   Hamstrings tight biat ~ 75-80 deg    Quadriceps tight bilat ~ 95 deg flexion heel to buttock    ITB tight bilat    Piriformis tight bilat      Palpation   Spinal mobility decreased mobility lumbar    Palpation comment tight bilat lumbar paraspinals; QL; lats; psoas      Special Tests    Special Tests --  (-) SLR; faber      Ambulation/Gait   Gait Comments wide based gait with steppage gait Rt > Lt; LE's ER; flexed at hips and trunk - unstable gait pattern      Balance   Balance Assessed --  unable to stand on either foot                    [facility] Adult PT Treatment/Exercise - 08/31/16 0001      Exercises   Exercises Knee/Hip;Lumbar     Lumbar Exercises: Stretches   Passive Hamstring Stretch 2 reps;30 seconds   Quad Stretch 2 reps;60 seconds   Piriformis Stretch 1 rep;30 seconds     Modalities   Modalities Electrical Stimulation;Moist Heat     Moist Heat Therapy   Number Minutes Moist Heat 15 Minutes   Moist Heat Location Lumbar Spine     Electrical Stimulation   Electrical Stimulation Location bilat lumbar paraspinals   Electrical Stimulation Action IFC   Electrical Stimulation Parameters to tolerance    Electrical Stimulation Goals Pain  PT Education - 08/31/16 1023    Education provided Yes    Education Details TENS, HEP; evaluation and goals    Person(s) Educated Patient   Methods Explanation;Handout;Verbal cues   Comprehension Verbalized understanding;Returned demonstration             PT Long Term Goals - 08/31/16 1021      PT LONG TERM GOAL #1   Title Improve standing posture, alignment and balance with patient to demonstrate good upright posture with improve neuromotor control 10/12/16   Time 6   Period Weeks   Status New     PT LONG TERM GOAL #2   Title Improve standing and walking tolerance to 20-30 min with decreased pain to 2/10 to 3/10  by 10/12/16   Time 6   Period Weeks   Status New     PT LONG TERM GOAL #3   Title Increase strength bilat LE's to 5/-/5 to 5/5 throughout 10/12/16   Time 6   Period Weeks   Status New     PT LONG TERM GOAL #4   Title Independent in HEP 10/12/16   Time 6   Period Weeks   Status New     PT LONG TERM GOAL #5   Title Improve FOTO to </= 52% limitation 10/12/16   Time 6   Period Weeks   Status New               Plan - 08/31/16 1013    Clinical Impression Statement Patient is seen for moderate complexity evaluation of LBP and LE weakness with gait instability. Rich presents with history of chronic LBP and increased weakness and gait instability following lumbar surgery 10/17 and subsequent travel stressors 12/17. He has poor posture and alignment; limited trunk and LE mobility and ROM; LE and core weakness; instability ins standing and walking; pain on a daily basis; limited functional activity level. He will benefit form PT to address limitations as noted.    Rehab Potential Good   PT Frequency 2x / week   PT Duration 6 weeks   PT Treatment/Interventions Patient/family education;ADLs/Self Care Home Management;Cryotherapy;Electrical Stimulation;Iontophoresis 4mg /ml Dexamethasone;Moist Heat;Ultrasound;Dry needling;Manual techniques;Therapeutic activities;Therapeutic exercise;Balance training;Neuromuscular  re-education;Gait training   PT Next Visit Plan review home exercise program; progress with core stabilization; LE strengthening; manual work vs DN; psoas stretch; modalities as indicated    Consulted and Agree with Plan of Care Patient      Patient will benefit from skilled therapeutic intervention in order to improve the following deficits and impairments:  Postural dysfunction, Improper body mechanics, Pain, Decreased strength, Decreased mobility, Decreased range of motion, Abnormal gait, Decreased balance, Decreased activity tolerance  Visit Diagnosis: Chronic bilateral low back pain without sciatica - Plan: PT plan of care cert/re-cert  Muscle weakness (generalized) - Plan: PT plan of care cert/re-cert  Other abnormalities of gait and mobility - Plan: PT plan of care cert/re-cert     Problem List Patient Active Problem List   Diagnosis Date Noted  . Diabetes mellitus type 2, diet-controlled ([place]) 06/08/2016  . Seborrheic dermatitis 06/08/2016  . Morbid obesity ([place]) 06/08/2016  . Ruptured tympanic membrane, bilateral 01/27/2016  . Annual physical exam 01/25/2016  . Paroxysmal atrial fibrillation ([place]) 06/15/2015  . Polycythemia, secondary 08/26/2014  . Hemochromatosis 08/26/2014  . Multiple pulmonary nodules 08/16/2014  . Testicular cancer ([place]) 08/12/2014  . Hyperlipidemia 08/10/2014  . Hypogonadism in male 08/10/2014  . Essential hypertension 08/10/2014  . Spinal stenosis of lumbar region 08/10/2014  . Recurrent  pulmonary emboli ([place]) 08/10/2014  . CAD (coronary artery disease), native coronary artery 08/10/2014    Emelie Newsom Nilda Simmer PT, MPH  08/31/2016, 1:34 PM  [facility] [place], [place], [address] Phone: [phone]   Fax:  [phone]  Name: Jeff Green MRN: [id number] Date of Birth: January 15, 1956

## 2016-09-03 ENCOUNTER — Other Ambulatory Visit: Payer: Self-pay | Admitting: Sports Medicine

## 2016-09-05 ENCOUNTER — Ambulatory Visit (INDEPENDENT_AMBULATORY_CARE_PROVIDER_SITE_OTHER): Payer: Medicare Other | Admitting: Physical Therapy

## 2016-09-05 DIAGNOSIS — M6281 Muscle weakness (generalized): Secondary | ICD-10-CM

## 2016-09-05 DIAGNOSIS — M545 Low back pain, unspecified: Secondary | ICD-10-CM

## 2016-09-05 DIAGNOSIS — G8929 Other chronic pain: Secondary | ICD-10-CM

## 2016-09-05 DIAGNOSIS — R2689 Other abnormalities of gait and mobility: Secondary | ICD-10-CM

## 2016-09-05 NOTE — Patient Instructions (Signed)
Clam Shell 45 Degrees    Lying with hips and knees bent 45, one pillow between knees and ankles. Lift knee. Be sure pelvis does not roll backward. Do not arch back. Do __10_ times, each leg, _1__ times per day.  http://ss.exer.us/74   Copyright  VHI. All rights reserved.   Adduction: Hip - Knees Together (Hook-Lying)    Lie with hips and knees bent, towel roll between knees. Push knees together. Hold for _3__ seconds. Rest for _3__ seconds. Repeat _10__ times. Do _1__ times a day.   Copyright  VHI. All rights reserved.   Heel Raise, Seated    Sit on ball with knees bent at 90. Place a _0__ lb dumbbell on top of each thigh. Lift heels off floor. Do _1__ sets of __10_ repetitions.  Copyright  VHI. All rights reserved.  Mikle Bosworth, PTA 09/05/16 11:01 AM  [facility] [address], [place], [place] [address] Phone # [phone] Fax [phone]

## 2016-09-05 NOTE — Therapy (Signed)
[place], [place], [address] Phone: [phone]   Fax:  [phone]  Physical Therapy Treatment  Patient Details  Name: Jeff Green MRN: [id number] Date of Birth: [birth year] Referring Provider: Vanice Sarah, PA-C   Encounter Date: 09/05/2016      PT End of Session - 09/05/16 1020    Visit Number 2   Number of Visits 12   Date for PT Re-Evaluation 10/12/16   PT Start Time 1010   PT Stop Time 1115   PT Time Calculation (min) 65 min   Activity Tolerance Patient tolerated treatment well      Past Medical History:  Diagnosis Date  . Atrial fibrillation ([place])   . CAD (coronary artery disease)    Stent placed; [place]  . Congestive heart failure (CHF) ([place])   . DVT (deep vein thrombosis) in pregnancy ([place])   . High cholesterol   . Hypertension   . Pulmonary embolus [facility])     Past Surgical History:  Procedure Laterality Date  . HERNIA REPAIR    . IVC  2014  . lamenectomy    . left orchiectomy      There were no vitals filed for this visit.      Subjective Assessment - 09/05/16 1012    Subjective Patient reports feeling some soreness today just from having done new movments and new stretches. Patient had increase pain in Lt low back around to hip yesterday that is better today. Patient reports he has been doing the stretches at home and feels they are helping.    Pertinent History Back injury 1988; bilat knee pain; cardiac stent ~5 yrs ago    How long can you sit comfortably? not at all   How long can you stand comfortably? 5 min    How long can you walk comfortably? 5-10 min    Diagnostic tests MRI Xray    Patient Stated Goals to strengthen back and come out feeling a whole lot better; to be able to walk comfortably    Currently in Pain? Yes   Pain Score 4    Pain Location Back   Pain Orientation Left;Right;Posterior;Lower   Pain Descriptors / Indicators  Sharp;Nagging   Pain Type Chronic pain   Pain Radiating Towards into tail bone   Pain Onset More than a month ago   Pain Frequency Constant   Aggravating Factors  prolonged sitting; standing; walking   Pain Relieving Factors topical cream; ice                         [facility] Adult PT Treatment/Exercise - 09/05/16 0001      Therapeutic Activites    Therapeutic Activities ADL's   ADL's Standing, sit to stand, squating     Neuro Re-ed    Neuro Re-ed Details  Activation on abdominals and gluteals     Lumbar Exercises: Stretches   Passive Hamstring Stretch 2 reps;30 seconds   Quad Stretch 2 reps;60 seconds   Piriformis Stretch 1 rep;30 seconds     Lumbar Exercises: Seated   Sit to Stand 10 reps  WIth discussion on proper body mechanics     Lumbar Exercises: Supine   Ab Set 10 reps   Glut Set 10 reps  With ball squeeze   Bent Knee Raise 10 reps  VC for abdominal brace   Bridge 10 reps     Lumbar Exercises: Sidelying   Clam  10 reps  Each leg     Knee/Hip Exercises: Standing   Hip Abduction Stengthening;Both;2 sets;10 reps   Hip Extension Stengthening;Both;2 sets;10 reps     Modalities   Modalities Electrical Stimulation;Moist Heat     Moist Heat Therapy   Number Minutes Moist Heat 15 Minutes   Moist Heat Location Lumbar Spine     Electrical Stimulation   Electrical Stimulation Location bilat lumbar paraspinals   Electrical Stimulation Action IFC   Electrical Stimulation Parameters To tolerance   Electrical Stimulation Goals Pain     Manual Therapy   Manual Therapy Soft tissue mobilization   Soft tissue mobilization To lumbar spine in prone                PT Education - 09/05/16 1101    Education provided Yes   Education Details Hip and core stability, ankle AROM   Person(s) Educated Patient   Methods Explanation;Handout   Comprehension Verbalized understanding             PT Long Term Goals - 09/05/16 1022      PT LONG TERM  GOAL #1   Title Improve standing posture, alignment and balance with patient to demonstrate good upright posture with improve neuromotor control 10/12/16   Time 6   Period Weeks   Status On-going     PT LONG TERM GOAL #2   Title Improve standing and walking tolerance to 20-30 min with decreased pain to 2/10 to 3/10  by 10/12/16   Time 6   Period Weeks   Status On-going     PT LONG TERM GOAL #3   Title Increase strength bilat LE's to 5/-/5 to 5/5 throughout 10/12/16   Time 6   Period Weeks   Status On-going     PT LONG TERM GOAL #4   Title Independent in HEP 10/12/16   Time 6   Period Weeks   Status On-going     PT LONG TERM GOAL #5   Title Improve FOTO to </= 52% limitation 10/12/16   Time 6   Period Weeks   Status On-going               Plan - 09/05/16 1149    Clinical Impression Statement Patient reports having some soreness today. Patient with tightness across low back with bridges so did not attempt more. Patient able to tolerate all strenghtening exercises well. Patient demonstrates weakness in standing with difficulty maintaining balance and keeping hips level. Also some difficulty with controling sit to stand. Patient reports feeling weak with going down stairs at home and reported he is unable to lift up onto toes in standing.  Patient will continue to benefit from skilled thearpy for core and glute strengthening and LE stability.     Rehab Potential Good   PT Frequency 2x / week   PT Duration 6 weeks   PT Treatment/Interventions Patient/family education;ADLs/Self Care Home Management;Cryotherapy;Electrical Stimulation;Iontophoresis 4mg /ml Dexamethasone;Moist Heat;Ultrasound;Dry needling;Manual techniques;Therapeutic activities;Therapeutic exercise;Balance training;Neuromuscular re-education;Gait training   PT Next Visit Plan Glute and core strengthening, LE strength for balance, manual soft tissue mobilization to low back   Consulted and Agree with Plan of Care  Patient      Patient will benefit from skilled therapeutic intervention in order to improve the following deficits and impairments:  Postural dysfunction, Improper body mechanics, Pain, Decreased strength, Decreased mobility, Decreased range of motion, Abnormal gait, Decreased balance, Decreased activity tolerance  Visit Diagnosis: Chronic bilateral low back pain without sciatica  Muscle weakness (generalized)  Other abnormalities of gait and mobility     Problem List Patient Active Problem List   Diagnosis Date Noted  . Diabetes mellitus type 2, diet-controlled ([place]) 06/08/2016  . Seborrheic dermatitis 06/08/2016  . Morbid obesity ([place]) 06/08/2016  . Ruptured tympanic membrane, bilateral 01/27/2016  . Annual physical exam 01/25/2016  . Paroxysmal atrial fibrillation ([place]) 06/15/2015  . Polycythemia, secondary 08/26/2014  . Hemochromatosis 08/26/2014  . Multiple pulmonary nodules 08/16/2014  . Testicular cancer ([facility]) 08/12/2014  . Hyperlipidemia 08/10/2014  . Hypogonadism in male 08/10/2014  . Essential hypertension 08/10/2014  . Spinal stenosis of lumbar region 08/10/2014  . Recurrent pulmonary emboli ([place]) 08/10/2014  . CAD (coronary artery disease), native coronary artery 08/10/2014    Mikle Bosworth PTA 09/05/2016, 11:54 AM  [facility] [place], [place], [address] Phone: [phone]   Fax:  [phone]  Name: Jeff Green MRN: [id number] Date of Birth: Aug 16, 1955

## 2016-09-07 ENCOUNTER — Encounter: Payer: Medicare Other | Admitting: Rehabilitative and Restorative Service Providers"

## 2016-09-10 ENCOUNTER — Other Ambulatory Visit: Payer: Self-pay | Admitting: Sports Medicine

## 2016-09-10 ENCOUNTER — Encounter: Payer: Medicare Other | Admitting: Physical Therapy

## 2016-09-13 ENCOUNTER — Ambulatory Visit (INDEPENDENT_AMBULATORY_CARE_PROVIDER_SITE_OTHER): Payer: Medicare Other | Admitting: Sports Medicine

## 2016-09-13 ENCOUNTER — Other Ambulatory Visit: Payer: Self-pay | Admitting: *Deleted

## 2016-09-13 ENCOUNTER — Encounter: Payer: Self-pay | Admitting: Rehabilitative and Restorative Service Providers"

## 2016-09-13 ENCOUNTER — Ambulatory Visit (INDEPENDENT_AMBULATORY_CARE_PROVIDER_SITE_OTHER): Payer: Medicare Other | Admitting: Rehabilitative and Restorative Service Providers"

## 2016-09-13 DIAGNOSIS — G8929 Other chronic pain: Secondary | ICD-10-CM

## 2016-09-13 DIAGNOSIS — M545 Low back pain, unspecified: Secondary | ICD-10-CM

## 2016-09-13 DIAGNOSIS — I2699 Other pulmonary embolism without acute cor pulmonale: Secondary | ICD-10-CM | POA: Diagnosis not present

## 2016-09-13 DIAGNOSIS — M6281 Muscle weakness (generalized): Secondary | ICD-10-CM

## 2016-09-13 DIAGNOSIS — R2689 Other abnormalities of gait and mobility: Secondary | ICD-10-CM

## 2016-09-13 DIAGNOSIS — I480 Paroxysmal atrial fibrillation: Secondary | ICD-10-CM

## 2016-09-13 LAB — POCT INR
INR: 7.5
INR: 3

## 2016-09-13 MED ORDER — WARFARIN SODIUM 7.5 MG PO TABS: 7.5000 mg | ORAL_TABLET | Freq: Every day | ORAL | 0 refills | Status: DC

## 2016-09-13 NOTE — Therapy (Signed)
[place], [place], [address] Phone: [phone]   Fax:  [phone]  Physical Therapy Treatment  Patient Details  Name: Jeff Green MRN: [id number] Date of Birth: [birth year] Referring Provider: Legrand Como Roche PA-C  Encounter Date: 09/13/2016      PT End of Session - 09/13/16 0931    Visit Number 3   Number of Visits 12   Date for PT Re-Evaluation 10/12/16   PT Start Time 0931   PT Stop Time 1030   PT Time Calculation (min) 59 min   Activity Tolerance Patient tolerated treatment well      Past Medical History:  Diagnosis Date  . Atrial fibrillation ([place])   . CAD (coronary artery disease)    Stent placed; [place]  . Congestive heart failure (CHF) ([place])   . DVT (deep vein thrombosis) in pregnancy ([place])   . High cholesterol   . Hypertension   . Pulmonary embolus [facility])     Past Surgical History:  Procedure Laterality Date  . HERNIA REPAIR    . IVC  2014  . lamenectomy    . left orchiectomy      There were no vitals filed for this visit.      Subjective Assessment - 09/13/16 0932    Subjective Patient reports that he can feel a difference - feeling some gain in strength. Back still feels tight - most of his pain is in the middle of the tailbone down low. No known injury or fall that may have created pain in that area.    Currently in Pain? Yes   Pain Score 3    Pain Location Back   Pain Orientation Lower  tailbone    Pain Descriptors / Indicators Sharp;Nagging   Pain Type Chronic pain   Pain Radiating Towards tail bone    Pain Onset More than a month ago   Pain Frequency Intermittent   Aggravating Factors  prolonged sitting, standing, walking    Pain Relieving Factors topical cream; ice             [facility] PT Assessment - 09/13/16 0001      Assessment   Medical Diagnosis LBP    Referring Provider Legrand Como Roche PA-C   Onset Date/Surgical Date 02/08/16    Hand Dominance Right   Next MD Visit 09/20/16   Prior Therapy yes      Strength   Right Hip Flexion 4+/5   Right Hip Extension 4/5   Right Hip ABduction 4+/5   Left Hip Flexion --  5-/5   Left Hip Extension 4+/5   Left Hip ABduction --  5-/5     Flexibility   Hamstrings tight biat ~ 80 deg    Quadriceps tight bilat ~ 100 deg flexion heel to buttock    ITB tight bilat    Piriformis tight bilat      Palpation   Spinal mobility decreased mobility lumbar    Palpation comment tight bilat lumbar paraspinals; QL; lats; psoas                      [facility] Adult PT Treatment/Exercise - 09/13/16 0001      Lumbar Exercises: Stretches   Passive Hamstring Stretch 2 reps;30 seconds   Double Knee to Chest Stretch 1 rep;20 seconds   Double Knee to Chest Stretch Limitations seated forward flexion for stretch to paraspinals through thoracic and lumbar area 30 sec x 2 pillow  in lap    Quad Stretch 2 reps;60 seconds   Piriformis Stretch 1 rep;30 seconds     Lumbar Exercises: Seated   Sit to Stand 10 reps     Lumbar Exercises: Supine   Ab Set 10 reps     Knee/Hip Exercises: Stretches   Hip Flexor Stretch 2 reps;30 seconds   Hip Flexor Stretch Limitations sitting at edge of table one knee dropped off table    Other Knee/Hip Stretches sartorius stretch Lt in supine with strap 30 sec x 2      Knee/Hip Exercises: Aerobic   Nustep L5 x 6 min      Knee/Hip Exercises: Standing   Hip Abduction Stengthening;Both;2 sets;10 reps   Hip Extension Stengthening;Both;2 sets;10 reps   Forward Step Up Right;Left;10 reps   Functional Squat 10 reps     Moist Heat Therapy   Number Minutes Moist Heat 20 Minutes   Moist Heat Location Lumbar Spine  buttocks      Electrical Stimulation   Electrical Stimulation Location bilat lumbar paraspinals; gluteal fold area    Electrical Stimulation Action IFC   Electrical Stimulation Parameters to tolerance   Electrical Stimulation Goals Pain;Tone      Manual Therapy   Manual therapy comments pt prone    Soft tissue mobilization working through area of coccyx - either side - noted banding and myofacial tightness                 PT Education - 09/13/16 0959    Education provided Yes   Education Details HEP   Person(s) Educated Patient   Methods Explanation;Demonstration;Tactile cues;Verbal cues;Handout   Comprehension Verbalized understanding;Returned demonstration;Verbal cues required;Tactile cues required             PT Long Term Goals - 09/13/16 0937      PT LONG TERM GOAL #1   Title Improve standing posture, alignment and balance with patient to demonstrate good upright posture with improve neuromotor control 10/12/16   Time 6   Period Weeks   Status On-going     PT LONG TERM GOAL #2   Title Improve standing and walking tolerance to 20-30 min with decreased pain to 2/10 to 3/10  by 10/12/16   Time 6   Period Weeks   Status On-going     PT LONG TERM GOAL #3   Title Increase strength bilat LE's to 5/-/5 to 5/5 throughout 10/12/16   Time 6   Period Weeks   Status On-going     PT LONG TERM GOAL #4   Title Independent in HEP 10/12/16   Time 6   Period Weeks   Status On-going     PT LONG TERM GOAL #5   Title Improve FOTO to </= 52% limitation 10/12/16   Time 6   Period Weeks   Status On-going               Plan - 09/13/16 2025    Clinical Impression Statement Progressing well with report of less pain and improving strength. Patient demonstrates increased strength and stability with gait and functional transfers. He continues to c/o tightness and pain in the area of the coccyx. Noted taightess to palpatioin in that area at either side of the coccyx consistent with coccyxdynia. Responded well to manual work followed by modalities. Progressing well toward stated goals of therapy.    Rehab Potential Good   PT Frequency 2x / week   PT Duration 6 weeks   PT Treatment/Interventions  Patient/family  education;ADLs/Self Care Home Management;Cryotherapy;Electrical Stimulation;Iontophoresis 4mg /ml Dexamethasone;Moist Heat;Ultrasound;Dry needling;Manual techniques;Therapeutic activities;Therapeutic exercise;Balance training;Neuromuscular re-education;Gait training   PT Next Visit Plan continue with core strengthening; LE strengthening; balance; manual techniques as indicated. Continue to address tightness in the coccyx area. Discussed referral to pelvic floor speacilist if coxxydynia persists    Consulted and Agree with Plan of Care Patient      Patient will benefit from skilled therapeutic intervention in order to improve the following deficits and impairments:  Postural dysfunction, Improper body mechanics, Pain, Decreased strength, Decreased mobility, Decreased range of motion, Abnormal gait, Decreased balance, Decreased activity tolerance  Visit Diagnosis: Chronic bilateral low back pain without sciatica  Muscle weakness (generalized)  Other abnormalities of gait and mobility     Problem List Patient Active Problem List   Diagnosis Date Noted  . Diabetes mellitus type 2, diet-controlled ([place]) 06/08/2016  . Seborrheic dermatitis 06/08/2016  . Morbid obesity ([place]) 06/08/2016  . Ruptured tympanic membrane, bilateral 01/27/2016  . Annual physical exam 01/25/2016  . Paroxysmal atrial fibrillation ([place]) 06/15/2015  . Polycythemia, secondary 08/26/2014  . Hemochromatosis 08/26/2014  . Multiple pulmonary nodules 08/16/2014  . Testicular cancer ([place]) 08/12/2014  . Hyperlipidemia 08/10/2014  . Hypogonadism in male 08/10/2014  . Essential hypertension 08/10/2014  . Spinal stenosis of lumbar region 08/10/2014  . Recurrent pulmonary emboli ([place]) 08/10/2014  . CAD (coronary artery disease), native coronary artery 08/10/2014    Celyn Nilda Simmer PT, MPH  09/13/2016, 12:51 PM  [facility] [place], [place],  [address] Phone: [phone]   Fax:  [phone]  Name: Jeff Green MRN: [id number] Date of Birth: 06-07-1955

## 2016-09-13 NOTE — Progress Notes (Signed)
Pt would like to know if he could cut down on some of his BP meds.  He states that his pressures have been really good now that he has a lot less stress in his life.  Please advise.

## 2016-09-13 NOTE — Patient Instructions (Addendum)
Stretch out strap - green strap for stretching    Quads / HF, Lunge    Sitting in chair; one knee dropped of edge of chair, push that leg back to feel stretch in the front of the hip and thigh.  Hold _30-45__ seconds. Repeat _3__ times per session. Do _2-3__ sessions per day.  Forward    Facing step, place one leg on step, flexed at hip. Step up slowly, bringing hips in line with knee and shoulder. Bring other foot onto step. Reverse process to step back down. Repeat with other leg. Do _20-30___ repetitions, _1-2___ sets.   FUNCTIONAL MOBILITY: Lateral Step Up    Step up sideways, leading with right leg. __10_ reps per set, _2-3__ sets per day, _1-2__ times/day    Functional Quadriceps: Sit to Stand    Sit on edge of chair, feet flat on floor. Stand upright, extending knees fully. Repeat _10___ times per set. Do _1-2___ sets per session. Do __1-2__ sessions per day.   Knee [facility] a chair for balance, slowly bend knees. Keep both feet on the floor. Repeat ___10-20_ times. Do __1-2__ sessions per day.   Plantar Flexion: Resisted    Anchor behind, tubing around left foot, press down. Repeat _10___ times per set. Do _2-3___ sets per session. Do __1-2__ sessions per day.

## 2016-09-14 ENCOUNTER — Other Ambulatory Visit: Payer: Self-pay | Admitting: Sports Medicine

## 2016-09-14 DIAGNOSIS — I10 Essential (primary) hypertension: Secondary | ICD-10-CM

## 2016-09-14 NOTE — Telephone Encounter (Signed)
Pt is requesting a refill for the metoprolol, he only has 4 left.  Please advise.

## 2016-09-18 ENCOUNTER — Encounter: Payer: Medicare Other | Admitting: Physical Therapy

## 2016-09-18 ENCOUNTER — Telehealth: Payer: Self-pay

## 2016-09-18 NOTE — Telephone Encounter (Signed)
As a nurse visit or to follow up with you?

## 2016-09-18 NOTE — Telephone Encounter (Signed)
A nurse visit is fine.  

## 2016-09-18 NOTE — Telephone Encounter (Signed)
Have him stop the metoprolol and then return in one week for a blood pressure check.

## 2016-09-18 NOTE — Telephone Encounter (Signed)
Pt has been taking both.  He said he has been taking metoprolol for about 5 years, and he thinks the carvedilol about 8 months or so.  Which one would you like him to stop?  Please advise.

## 2016-09-18 NOTE — Telephone Encounter (Signed)
Pharmacy called Friday, and wanted to verify that patient should be on both metoprolol and carvedilol?  Please advise.

## 2016-09-18 NOTE — Telephone Encounter (Signed)
Notified patient and transferred to scheduling for nurse visit.

## 2016-09-18 NOTE — Telephone Encounter (Signed)
No definitely not.  Please call patient to see which one he has been taking.

## 2016-09-20 DIAGNOSIS — G894 Chronic pain syndrome: Secondary | ICD-10-CM | POA: Diagnosis not present

## 2016-09-20 DIAGNOSIS — M79606 Pain in leg, unspecified: Secondary | ICD-10-CM | POA: Diagnosis not present

## 2016-09-20 DIAGNOSIS — Z79891 Long term (current) use of opiate analgesic: Secondary | ICD-10-CM | POA: Diagnosis not present

## 2016-09-20 DIAGNOSIS — M961 Postlaminectomy syndrome, not elsewhere classified: Secondary | ICD-10-CM | POA: Diagnosis not present

## 2016-09-20 DIAGNOSIS — M545 Low back pain: Secondary | ICD-10-CM | POA: Diagnosis not present

## 2016-09-20 DIAGNOSIS — Z79899 Other long term (current) drug therapy: Secondary | ICD-10-CM | POA: Diagnosis not present

## 2016-09-21 ENCOUNTER — Ambulatory Visit (INDEPENDENT_AMBULATORY_CARE_PROVIDER_SITE_OTHER): Payer: Medicare Other | Admitting: Physical Therapy

## 2016-09-21 DIAGNOSIS — G8929 Other chronic pain: Secondary | ICD-10-CM

## 2016-09-21 DIAGNOSIS — M6281 Muscle weakness (generalized): Secondary | ICD-10-CM

## 2016-09-21 DIAGNOSIS — M545 Low back pain, unspecified: Secondary | ICD-10-CM

## 2016-09-21 DIAGNOSIS — R2689 Other abnormalities of gait and mobility: Secondary | ICD-10-CM | POA: Diagnosis not present

## 2016-09-21 NOTE — Therapy (Signed)
[place], [place], [address] Phone: [phone]   Fax:  [phone]  Physical Therapy Treatment  Patient Details  Name: Jeff Green MRN: [id number] Date of Birth: [birth year] Referring Provider: Vanice Sarah, PA-C  Encounter Date: 09/21/2016      PT End of Session - 09/21/16 1027    Visit Number 4   Number of Visits 12   Date for PT Re-Evaluation 10/12/16   PT Start Time 1019   PT Stop Time 1118   PT Time Calculation (min) 59 min   Activity Tolerance Patient tolerated treatment well;No increased pain   Behavior During Therapy WFL for tasks assessed/performed      Past Medical History:  Diagnosis Date  . Atrial fibrillation ([place])   . CAD (coronary artery disease)    Stent placed; [place]  . Congestive heart failure (CHF) ([place])   . DVT (deep vein thrombosis) in pregnancy ([place])   . High cholesterol   . Hypertension   . Pulmonary embolus [facility])     Past Surgical History:  Procedure Laterality Date  . HERNIA REPAIR    . IVC  2014  . lamenectomy    . left orchiectomy      There were no vitals filed for this visit.      Subjective Assessment - 09/21/16 1023    Subjective Pt reported he had no pain for a day following last treatment.  He is trying to be mindful of sitting position.  The manual therapy to tailbone area really helped.     Pertinent History Back injury 1988; bilat knee pain; cardiac stent ~5 yrs ago    Currently in Pain? Yes   Pain Score 3    Pain Location Back   Pain Orientation Lower  tail bone   Pain Descriptors / Indicators Nagging   Aggravating Factors  prolonged sitting, standing, walking   Pain Relieving Factors ice, topical cream             [facility] PT Assessment - 09/21/16 0001      Assessment   Medical Diagnosis LBP    Referring Provider Legrand Como Roche, PA-C     Palpation   Palpation comment tightness noted in Lt coccyx fascia  compared to Rt; notable less tightness than last visit  per supervising PT, Celyn Holt.           [place] Adult PT Treatment/Exercise - 09/21/16 0001      Lumbar Exercises: Stretches   Passive Hamstring Stretch 30 seconds;3 reps  supine with strap   Double Knee to Chest Stretch Limitations seated forward flexion for stretch to paraspinals through thoracic and lumbar area 30 sec x 2 pillow in lap, then rolling green ball forward (seated), then laying prone over ball to stretch low back.     Piriformis Stretch 2 reps;30 seconds  supine, fig 4, pressing thigh down     Lumbar Exercises: Aerobic   Stationary Bike NuStep L6: 5.5 min      Lumbar Exercises: Supine   Ab Set 10 reps;5 seconds   Bent Knee Raise --     Knee/Hip Exercises: Stretches   Hip Flexor Stretch 2 reps;30 seconds   Hip Flexor Stretch Limitations sitting at edge of table one knee dropped off table      Moist Heat Therapy   Number Minutes Moist Heat 20 Minutes   Moist Heat Location Lumbar Spine  buttocks      Electrical Stimulation  Electrical Stimulation Location bilat lumbar paraspinals; gluteal fold area    Electrical Stimulation Action IFC   Electrical Stimulation Parameters to tolerance   Electrical Stimulation Goals Tone;Pain     Manual Therapy   Manual Therapy Myofascial release   Manual therapy comments pt prone   performed by Gillermo Murdoch, PT.    Soft tissue mobilization working through area of coccyx - either side - noted banding and myofacial tightness    Myofascial Release MFR to gluteal fold and lumbar fascia bilat                      PT Long Term Goals - 09/13/16 0937      PT LONG TERM GOAL #1   Title Improve standing posture, alignment and balance with patient to demonstrate good upright posture with improve neuromotor control 10/12/16   Time 6   Period Weeks   Status On-going     PT LONG TERM GOAL #2   Title Improve standing and walking tolerance to 20-30 min with decreased  pain to 2/10 to 3/10  by 10/12/16   Time 6   Period Weeks   Status On-going     PT LONG TERM GOAL #3   Title Increase strength bilat LE's to 5/-/5 to 5/5 throughout 10/12/16   Time 6   Period Weeks   Status On-going     PT LONG TERM GOAL #4   Title Independent in HEP 10/12/16   Time 6   Period Weeks   Status On-going     PT LONG TERM GOAL #5   Title Improve FOTO to </= 52% limitation 10/12/16   Time 6   Period Weeks   Status On-going               Plan - 09/21/16 1100    Clinical Impression Statement Pt had positive response to last session of manual therapy.  There was less fascial tightness in coccyx region bilat compared to last visit,.  Pt tolerated all exercises well, without increase in symptms.    Rehab Potential Good   PT Frequency 2x / week   PT Duration 6 weeks   PT Treatment/Interventions Patient/family education;ADLs/Self Care Home Management;Cryotherapy;Electrical Stimulation;Iontophoresis 4mg /ml Dexamethasone;Moist Heat;Ultrasound;Dry needling;Manual techniques;Therapeutic activities;Therapeutic exercise;Balance training;Neuromuscular re-education;Gait training   PT Next Visit Plan continue with core strengthening; LE strengthening; balance; manual techniques as indicated. Continue to address tightness in the coccyx area. Discussed referral to pelvic floor speacilist if coxxydynia persists    Consulted and Agree with Plan of Care Patient      Patient will benefit from skilled therapeutic intervention in order to improve the following deficits and impairments:  Postural dysfunction, Improper body mechanics, Pain, Decreased strength, Decreased mobility, Decreased range of motion, Abnormal gait, Decreased balance, Decreased activity tolerance  Visit Diagnosis: Chronic bilateral low back pain without sciatica  Muscle weakness (generalized)  Other abnormalities of gait and mobility     Problem List Patient Active Problem List   Diagnosis Date Noted  .  Diabetes mellitus type 2, diet-controlled ([place]) 06/08/2016  . Seborrheic dermatitis 06/08/2016  . Morbid obesity ([place]) 06/08/2016  . Ruptured tympanic membrane, bilateral 01/27/2016  . Annual physical exam 01/25/2016  . Paroxysmal atrial fibrillation ([place]) 06/15/2015  . Polycythemia, secondary 08/26/2014  . Hemochromatosis 08/26/2014  . Multiple pulmonary nodules 08/16/2014  . Testicular cancer ([place]) 08/12/2014  . Hyperlipidemia 08/10/2014  . Hypogonadism in male 08/10/2014  . Essential hypertension 08/10/2014  . Spinal stenosis of lumbar region  08/10/2014  . Recurrent pulmonary emboli ([place]) 08/10/2014  . CAD (coronary artery disease), native coronary artery 08/10/2014   Kerin Perna, PTA 09/21/16 11:03 AM  Celyn P. Helene Kelp PT, MPH 09/21/16 11:56 AM   [facility] [place], [place], [address] Phone: [phone]   Fax:  [phone]  Name: Jeff Green MRN: [id number] Date of Birth: Aug 13, 1955

## 2016-09-25 ENCOUNTER — Ambulatory Visit (INDEPENDENT_AMBULATORY_CARE_PROVIDER_SITE_OTHER): Payer: Medicare Other | Admitting: Physical Therapy

## 2016-09-25 ENCOUNTER — Ambulatory Visit: Payer: Medicare Other

## 2016-09-25 ENCOUNTER — Encounter: Payer: Self-pay | Admitting: Physical Therapy

## 2016-09-25 ENCOUNTER — Ambulatory Visit (INDEPENDENT_AMBULATORY_CARE_PROVIDER_SITE_OTHER): Payer: Medicare Other | Admitting: Sports Medicine

## 2016-09-25 VITALS — BP 121/68 | HR 74 | Wt 317.0 lb

## 2016-09-25 DIAGNOSIS — M545 Low back pain, unspecified: Secondary | ICD-10-CM

## 2016-09-25 DIAGNOSIS — I10 Essential (primary) hypertension: Secondary | ICD-10-CM

## 2016-09-25 DIAGNOSIS — R2689 Other abnormalities of gait and mobility: Secondary | ICD-10-CM | POA: Diagnosis not present

## 2016-09-25 DIAGNOSIS — M6281 Muscle weakness (generalized): Secondary | ICD-10-CM | POA: Diagnosis not present

## 2016-09-25 DIAGNOSIS — G8929 Other chronic pain: Secondary | ICD-10-CM | POA: Diagnosis not present

## 2016-09-25 NOTE — Patient Instructions (Addendum)
Straight Leg Raise: With External Leg Rotation - keep core/belly pulled in while performing    Lie on back with right leg straight, opposite leg bent. Rotate straight leg out and lift _10-12___ inches. Repeat __10__ times per set. Do __2-3__ sets per session. Do __1__ sessions per day.   Functional Quadriceps: Sit to Stand - keep core tight.     Sit on edge of chair, feet flat on floor. Stand upright, extending knees fully. Repeat __10__ times per set. Do __2-3__ sets per session. Do _ ___ sessions per day.  Gastroc Stretch - keep heel down and don't let foot roll out    Stand with right foot back, leg straight, forward leg bent. Keeping heel on floor, turned slightly out, lean into wall until stretch is felt in calf. Hold _45___ seconds. Repeat _2___ times per set. Do _1___ sets per session. Do _1___ sessions per day.  Dorsiflexion: Resisted - hook band in door or have someone else hold the end.  Toe tapping to music will work still.     Facing anchor, tubing around left foot, pull toward face.  Repeat __to fatigue__ times per set. Do __3__ sets per session. Do __1__ sessions per day.

## 2016-09-25 NOTE — Therapy (Signed)
[place], [place], [address] Phone: [phone]   Fax:  [phone]  Physical Therapy Treatment  Patient Details  Name: Jeff Green MRN: [id number] Date of Birth: 1955/11/19 Referring Provider: Vanice Sarah, PA-C  Encounter Date: 09/25/2016      PT End of Session - 09/25/16 0933    Visit Number 5   Number of Visits 12   Date for PT Re-Evaluation 10/12/16   PT Start Time 0933   PT Stop Time 5597   PT Time Calculation (min) 71 min   Activity Tolerance Patient tolerated treatment well      Past Medical History:  Diagnosis Date  . Atrial fibrillation ([place])   . CAD (coronary artery disease)    Stent placed; [place]  . Congestive heart failure (CHF) ([place])   . DVT (deep vein thrombosis) in pregnancy ([place])   . High cholesterol   . Hypertension   . Pulmonary embolus [facility])     Past Surgical History:  Procedure Laterality Date  . HERNIA REPAIR    . IVC  2014  . lamenectomy    . left orchiectomy      There were no vitals filed for this visit.      Subjective Assessment - 09/25/16 0936    Subjective Pt reports he is just a little sore, he was able to ride his motorcycle some. He is also reducing his medication. Pt reports his knees are starting to hurt now, about 5/10   Patient Stated Goals to strengthen back and come out feeling a whole lot better; to be able to walk comfortably    Currently in Pain? Yes   Pain Score 2    Pain Location Coccyx   Pain Orientation Lower   Pain Descriptors / Indicators Nagging   Pain Type Chronic pain   Pain Onset More than a month ago   Pain Frequency Intermittent   Aggravating Factors  prolonged sitting   Pain Relieving Factors ice and the manual work                         [facility] Adult PT Treatment/Exercise - 09/25/16 0001      Lumbar Exercises: Stretches   Pelvic Tilt --  20 reps in sitting   Standing gastroc  stretches 45 sec gastroc stretch  2 reps, VC for form     Lumbar Exercises: Aerobic   Stationary Bike NuStep L6: 5 min      Lumbar Exercises: Seated   Sit to Stand 20 reps  3x fatigue dorsiflexion with yellow band. Red band to hard and form was poor     Lumbar Exercises: Supine   Straight Leg Raise --  30 reps, with ER each side, fatigued     Modalities   Modalities Electrical Stimulation;Moist Heat     Moist Heat Therapy   Number Minutes Moist Heat 20 Minutes   Moist Heat Location Lumbar Spine  buttocks,      Electrical Stimulation   Electrical Stimulation Location bilat lumbar paraspinals; gluteal fold area    Electrical Stimulation Action IFC   Electrical Stimulation Parameters to tolerance   Electrical Stimulation Goals Tone;Pain     Manual Therapy   Manual Therapy Myofascial release   Manual therapy comments pt prone    Soft tissue mobilization working through area of coccyx - either side - noted banding and myofacial tightness    Myofascial Release MFR to  gluteal fold and lumbar fascia bilat   active release to ligaments around coccyx in s/l                PT Education - 09/25/16 1004    Education provided Yes   Education Details HEP   Person(s) Educated Patient   Methods Explanation;Handout;Demonstration   Comprehension Verbalized understanding             PT Long Term Goals - 09/25/16 1041      PT LONG TERM GOAL #1   Title Improve standing posture, alignment and balance with patient to demonstrate good upright posture with improve neuromotor control 10/12/16   Status On-going     PT LONG TERM GOAL #2   Title Improve standing and walking tolerance to 20-30 min with decreased pain to 2/10 to 3/10  by 10/12/16   Status Partially Met  pain level is decreasing     PT LONG TERM GOAL #3   Title Increase strength bilat LE's to 5/-/5 to 5/5 throughout 10/12/16   Status On-going     PT LONG TERM GOAL #4   Title Independent in HEP 10/12/16   Status  On-going     PT LONG TERM GOAL #5   Title Improve FOTO to </= 52% limitation 10/12/16   Status On-going               Plan - 09/25/16 1037    Clinical Impression Statement Jeff Green has done very well with manual work to the coccyx area, he has increased motion and able to walk better. He is beginning to develop bilat knee pain from his gait changes and noticing defecits in his ankles more.     Rehab Potential Good   PT Frequency 2x / week   PT Duration 6 weeks   PT Treatment/Interventions Patient/family education;ADLs/Self Care Home Management;Cryotherapy;Electrical Stimulation;Iontophoresis 47m/ml Dexamethasone;Moist Heat;Ultrasound;Dry needling;Manual techniques;Therapeutic activities;Therapeutic exercise;Balance training;Neuromuscular re-education;Gait training   PT Next Visit Plan assess tolerance to added HEP, cont with manual work PRN   Consulted and Agree with Plan of Care Patient      Patient will benefit from skilled therapeutic intervention in order to improve the following deficits and impairments:  Postural dysfunction, Improper body mechanics, Pain, Decreased strength, Decreased mobility, Decreased range of motion, Abnormal gait, Decreased balance, Decreased activity tolerance  Visit Diagnosis: Other abnormalities of gait and mobility  Muscle weakness (generalized)  Chronic bilateral low back pain without sciatica     Problem List Patient Active Problem List   Diagnosis Date Noted  . Diabetes mellitus type 2, diet-controlled ([place] 06/08/2016  . Seborrheic dermatitis 06/08/2016  . Morbid obesity ([place] 06/08/2016  . Ruptured tympanic membrane, bilateral 01/27/2016  . Annual physical exam 01/25/2016  . Paroxysmal atrial fibrillation ([place] 06/15/2015  . Polycythemia, secondary 08/26/2014  . Hemochromatosis 08/26/2014  . Multiple pulmonary nodules 08/16/2014  . Testicular cancer ([place] 08/12/2014  . Hyperlipidemia 08/10/2014  . Hypogonadism in male 08/10/2014  .  Essential hypertension 08/10/2014  . Spinal stenosis of lumbar region 08/10/2014  . Recurrent pulmonary emboli ([place] 08/10/2014  . CAD (coronary artery disease), native coronary artery 08/10/2014    SJeral PinchPT  09/25/2016, 10:42 AM  [facility] [place] [address]Phone: [phone]  Fax:  [phone] Name: Jeff MusichMRN: [id number] of Birth: 107-Nov-1957

## 2016-09-25 NOTE — Progress Notes (Signed)
   Subjective:    Patient ID: Jeff Green, male    DOB: 1956/03/10, 61 y.o.   MRN: [id number]  Jeff Green is here for a blood pressure check. Last visit he was advised to stop Metoprolol.    Hypertension       Review of Systems  Constitutional: Negative.   HENT: Negative.   Respiratory: Negative.   Cardiovascular: Negative.        Objective:   Physical Exam        Assessment & Plan:  Hypertension - Continue current treatment. Follow up next month with Dr Dianah Field at scheduled appointment.

## 2016-09-27 ENCOUNTER — Encounter: Payer: Medicare Other | Admitting: Physical Therapy

## 2016-10-01 ENCOUNTER — Other Ambulatory Visit: Payer: Self-pay | Admitting: Sports Medicine

## 2016-10-04 ENCOUNTER — Telehealth: Payer: Self-pay | Admitting: Sports Medicine

## 2016-10-04 DIAGNOSIS — G8929 Other chronic pain: Secondary | ICD-10-CM

## 2016-10-04 DIAGNOSIS — M25512 Pain in left shoulder: Principal | ICD-10-CM

## 2016-10-04 NOTE — Telephone Encounter (Signed)
Patient came in after his PT appnt requesting that you add his left shoulder onto the PT order. He has been having some pain and immobility after sleeping on it. Please advise. Thanks!

## 2016-10-04 NOTE — Telephone Encounter (Signed)
Done

## 2016-10-05 DIAGNOSIS — M9904 Segmental and somatic dysfunction of sacral region: Secondary | ICD-10-CM | POA: Diagnosis not present

## 2016-10-05 DIAGNOSIS — M5106 Intervertebral disc disorders with myelopathy, lumbar region: Secondary | ICD-10-CM | POA: Diagnosis not present

## 2016-10-05 DIAGNOSIS — M546 Pain in thoracic spine: Secondary | ICD-10-CM | POA: Diagnosis not present

## 2016-10-05 DIAGNOSIS — M5432 Sciatica, left side: Secondary | ICD-10-CM | POA: Diagnosis not present

## 2016-10-05 DIAGNOSIS — M9903 Segmental and somatic dysfunction of lumbar region: Secondary | ICD-10-CM | POA: Diagnosis not present

## 2016-10-05 DIAGNOSIS — M9902 Segmental and somatic dysfunction of thoracic region: Secondary | ICD-10-CM | POA: Diagnosis not present

## 2016-10-05 DIAGNOSIS — M62552 Muscle wasting and atrophy, not elsewhere classified, left thigh: Secondary | ICD-10-CM | POA: Diagnosis not present

## 2016-10-05 DIAGNOSIS — M9905 Segmental and somatic dysfunction of pelvic region: Secondary | ICD-10-CM | POA: Diagnosis not present

## 2016-10-09 ENCOUNTER — Encounter: Payer: Medicare Other | Admitting: Physical Therapy

## 2016-10-10 ENCOUNTER — Ambulatory Visit (INDEPENDENT_AMBULATORY_CARE_PROVIDER_SITE_OTHER): Payer: Medicare Other | Admitting: Physical Therapy

## 2016-10-10 DIAGNOSIS — M545 Low back pain, unspecified: Secondary | ICD-10-CM

## 2016-10-10 DIAGNOSIS — R2689 Other abnormalities of gait and mobility: Secondary | ICD-10-CM | POA: Diagnosis not present

## 2016-10-10 DIAGNOSIS — G8929 Other chronic pain: Secondary | ICD-10-CM

## 2016-10-10 DIAGNOSIS — M6281 Muscle weakness (generalized): Secondary | ICD-10-CM

## 2016-10-10 DIAGNOSIS — M25512 Pain in left shoulder: Secondary | ICD-10-CM | POA: Diagnosis not present

## 2016-10-10 DIAGNOSIS — M25612 Stiffness of left shoulder, not elsewhere classified: Secondary | ICD-10-CM

## 2016-10-10 NOTE — Therapy (Signed)
[place], [place], [address] Phone: [phone]   Fax:  [phone]  Physical Therapy Treatment  Patient Details  Name: Jeff Green MRN: [id number] Date of Birth: 1955/08/24 Referring Provider: Dr Dianah Field back and shoulder  Encounter Date: 10/10/2016      PT End of Session - 10/10/16 1022    Visit Number 6   Number of Visits 14   Date for PT Re-Evaluation 11/07/16   PT Start Time 1024   PT Stop Time 1120   PT Time Calculation (min) 56 min   Activity Tolerance Patient tolerated treatment well      Past Medical History:  Diagnosis Date  . Atrial fibrillation ([place])   . CAD (coronary artery disease)    Stent placed; [place]  . Congestive heart failure (CHF) ([place])   . DVT (deep vein thrombosis) in pregnancy ([place])   . High cholesterol   . Hypertension   . Pulmonary embolus [facility])     Past Surgical History:  Procedure Laterality Date  . HERNIA REPAIR    . IVC  2014  . lamenectomy    . left orchiectomy      There were no vitals filed for this visit.      Subjective Assessment - 10/10/16 1024    Subjective Rich reports he has a 61 yo h/o Lt shoulder issues.  He sleeps on that shoulder and would see a chiropractor to help. Over the last 2-3 months the pain is getting worse and he is having limited motion in the shoulder now.  He has also beenusing the arms more because of his back pain   Pertinent History Back injury 1988; bilat knee pain; cardiac stent ~5 yrs ago    How long can you sit comfortably? 5 min   Patient Stated Goals to strengthen back and come out feeling a whole lot better; to be able to walk comfortably, have less pain in Lt shoulder and be able to lift it again without pain   Currently in Pain? Yes   Pain Score 4    Pain Location Coccyx   Pain Orientation Lower   Pain Descriptors / Indicators Dull;Aching   Pain Type Chronic pain   Pain Onset More  than a month ago   Pain Frequency Intermittent   Aggravating Factors  prolonged sitting    Pain Relieving Factors ice and the manual work, moving around and exercise   Multiple Pain Sites Yes   Pain Score 7   Pain Location Shoulder   Pain Orientation Left   Pain Descriptors / Indicators Sharp   Pain Type Chronic pain   Pain Onset More than a month ago   Pain Frequency Constant   Aggravating Factors  lifting the Lt UE   Pain Relieving Factors tried ice around the shoulder.             [facility] PT Assessment - 10/10/16 0001      Assessment   Medical Diagnosis LBP , Lt shoulder pain   Referring Provider Dr Dianah Field back and shoulder   Onset Date/Surgical Date 02/08/16  07/2016 for shoulder   Hand Dominance Right     Balance Screen   Has the patient fallen in the past 6 months --  none since starting therapy     Observation/Other Assessments   Focus on Therapeutic Outcomes (FOTO)  62% limited for back.      Posture/Postural Control   Posture Comments head forward;  shoudlers rounded and elevated; wide based stance; flexed forward at hips; knees hyperextended; LE's in ER      ROM / Strength   AROM / PROM / Strength AROM;Strength     AROM   AROM Assessment Site Shoulder;Cervical;Lumbar   Right/Left Shoulder Left  Rt WNL   Left Shoulder Extension 47 Degrees   Left Shoulder Flexion 112 Degrees   Left Shoulder ABduction 98 Degrees   Left Shoulder Internal Rotation 46 Degrees  with pain arm abducted 90 degrees.    Left Shoulder External Rotation 35 Degrees  with pain, seated, arm abduct 90 degrees   Cervical Flexion 50   Cervical Extension 30 with catch in neck   Cervical - Right Rotation 58   Cervical - Left Rotation 46   Lumbar Flexion to feet,   no pain   Lumbar Extension 35%    Lumbar - Right Rotation 505   Lumbar - Left Rotation 50%      Strength   Strength Assessment Site Hip;Shoulder   Right/Left Shoulder Left   Left Shoulder Flexion --  5-/5   Left  Shoulder Extension 5/5   Left Shoulder ABduction 4-/5  pain   Left Shoulder Internal Rotation 5/5   Left Shoulder External Rotation 3+/5   Left Shoulder Horizontal ABduction --  pain     Palpation   Palpation comment tight and tender in posterior Lt sholder, Lt pecs, and anterior shoulder                      [facility] Adult PT Treatment/Exercise - 10/10/16 0001      Exercises   Exercises Shoulder     Shoulder Exercises: Seated   External Rotation Strengthening;Both;10 reps;Theraband   Theraband Level (Shoulder External Rotation) Level 1 (Yellow)     Shoulder Exercises: Stretch   Other Shoulder Stretches low doorway stretch to stay out of painful area     Modalities   Modalities Electrical Stimulation;Moist Heat     Moist Heat Therapy   Number Minutes Moist Heat 15 Minutes   Moist Heat Location Shoulder  Lt and sacrum     Electrical Stimulation   Electrical Stimulation Location sacrum and Lt shoulder complex   Electrical Stimulation Action premod   Electrical Stimulation Parameters to tolerance   Electrical Stimulation Goals Tone;Pain     Manual Therapy   Manual Therapy Soft tissue mobilization   Soft tissue mobilization Lt shoulder STM to RTC muscles posterior shoulder, gentle scapular mobs.           Trigger Point Dry Needling - 10/10/16 1221    Consent Given? Yes   Education Handout Provided Yes   Muscles Treated Upper Body Supraspinatus;Infraspinatus;Subscapularis  all Lt side   Supraspinatus Response Palpable increased muscle length;Twitch response elicited   Infraspinatus Response Twitch response elicited;Palpable increased muscle length   Subscapularis Response Palpable increased muscle length;Twitch response elicited              PT Education - 10/10/16 1224    Education provided Yes   Education Details DN, HEP   Person(s) Educated Patient   Methods Demonstration;Explanation;Handout   Comprehension Returned demonstration;Verbalized  understanding             PT Long Term Goals - 10/10/16 1227      PT LONG TERM GOAL #1   Title Improve standing posture, alignment and balance with patient to demonstrate good upright posture with improve neuromotor control 11/07/16   Time 4  Period Weeks   Status On-going     PT LONG TERM GOAL #2   Title Improve standing and walking tolerance to 20-30 min with decreased pain to 2/10 to 3/10  by 11/07/16   Time 4   Status Partially Met  pain is decreasing and tolerating 10-15 minutes     PT LONG TERM GOAL #3   Title Increase strength bilat LE's to 5/-/5 to 5/5 throughout 11/07/16   Time 4   Period Weeks   Status On-going     PT LONG TERM GOAL #4   Title Independent in HEP 11/07/16   Time 4   Period Weeks   Status On-going     PT LONG TERM GOAL #5   Title Improve FOTO to </= 52% limitation 11/07/16   Time 4   Period Weeks   Status On-going  scored 62% limited ( was 66% at initial visit.      PT LONG TERM GOAL #6   Title report =/> 75% reduction of Lt shoulder pain to allow restoration of ROM (11/07/16)    Time 4   Period Weeks   Status New     PT LONG TERM GOAL #7   Title demo =/> 5+/5 strength of Lt shoulder without pain ( 11/07/16)    Time 4   Period Weeks   Status New               Plan - 2016-11-07 1225    Clinical Impression Statement Rich presents today to have his Lt shoulder evaluated.  He reports that referrring MD is willing to follow him for his back needs as well. Back is being renewed and adding in the shoulder.  He has tightness of the Lt shoulder musculature, along with limted movement and weakness. His back ROM has improved however he is still having pain and functional limitations.    Clinical Presentation Evolving   Clinical Decision Making Moderate   Rehab Potential Good   PT Frequency 2x / week   PT Duration 4 weeks   PT Treatment/Interventions Patient/family education;ADLs/Self Care Home Management;Cryotherapy;Electrical  Stimulation;Iontophoresis 93m/ml Dexamethasone;Moist Heat;Ultrasound;Dry needling;Manual techniques;Therapeutic activities;Therapeutic exercise;Balance training;Neuromuscular re-education;Gait training   PT Next Visit Plan assess response to DN of Lt shoulder, cont manual work to sacrum and progress Lt shoulder PRN   Consulted and Agree with Plan of Care Patient      Patient will benefit from skilled therapeutic intervention in order to improve the following deficits and impairments:  Postural dysfunction, Improper body mechanics, Pain, Decreased strength, Decreased mobility, Decreased range of motion, Abnormal gait, Decreased balance, Decreased activity tolerance, Impaired UE functional use, Increased muscle spasms  Visit Diagnosis: Other abnormalities of gait and mobility - Plan: PT plan of care cert/re-cert  Muscle weakness (generalized) - Plan: PT plan of care cert/re-cert  Chronic bilateral low back pain without sciatica - Plan: PT plan of care cert/re-cert  Chronic left shoulder pain - Plan: PT plan of care cert/re-cert  Stiffness of left shoulder, not elsewhere classified - Plan: PT plan of care cert/re-cert       [facility]-CODES - 007/18/181231    Functional Assessment Tool Used  Evaluation; FOTO; clinical assessment    Functional Limitations Mobility: Walking and moving around   Mobility: Walking and Moving Around Current Status At least 60 percent but less than 80 percent impaired, limited or restricted   Mobility: Walking and Moving Around Goal Status [phone] At least 60 percent but less than 80 percent impaired,  limited or restricted      Problem List Patient Active Problem List   Diagnosis Date Noted  . Diabetes mellitus type 2, diet-controlled ([place]) 06/08/2016  . Seborrheic dermatitis 06/08/2016  . Morbid obesity ([place]) 06/08/2016  . Ruptured tympanic membrane, bilateral 01/27/2016  . Annual physical exam 01/25/2016  . Paroxysmal atrial fibrillation ([place])  06/15/2015  . Polycythemia, secondary 08/26/2014  . Hemochromatosis 08/26/2014  . Multiple pulmonary nodules 08/16/2014  . Testicular cancer ([place]) 08/12/2014  . Hyperlipidemia 08/10/2014  . Hypogonadism in male 08/10/2014  . Essential hypertension 08/10/2014  . Spinal stenosis of lumbar region 08/10/2014  . Recurrent pulmonary emboli ([place]) 08/10/2014  . CAD (coronary artery disease), native coronary artery 08/10/2014    [name] PT  10/10/2016, 12:33 PM  [facility] [place], [place], [address] Phone: [phone]   Fax:  [phone]  Name: Parker Wherley MRN: [id number] Date of Birth: [birth year]

## 2016-10-10 NOTE — Patient Instructions (Addendum)
Trigger Point Dry Needling  . What is Trigger Point Dry Needling (DN)? o DN is a physical therapy technique used to treat muscle pain and dysfunction. Specifically, DN helps deactivate muscle trigger points (muscle knots).  o A thin filiform needle is used to penetrate the skin and stimulate the underlying trigger point. The goal is for a local twitch response (LTR) to occur and for the trigger point to relax. No medication of any kind is injected during the procedure.   . What Does Trigger Point Dry Needling Feel Like?  o The procedure feels different for each individual patient. Some patients report that they do not actually feel the needle enter the skin and overall the process is not painful. Very mild bleeding may occur. However, many patients feel a deep cramping in the muscle in which the needle was inserted. This is the local twitch response.   Marland Kitchen How Will I feel after the treatment? o Soreness is normal, and the onset of soreness may not occur for a few hours. Typically this soreness does not last longer than two days.  o Bruising is uncommon, however; ice can be used to decrease any possible bruising.  o In rare cases feeling tired or nauseous after the treatment is normal. In addition, your symptoms may get worse before they get better, this period will typically not last longer than 24 hours.   . What Can I do After My Treatment? o Increase your hydration by drinking more water for the next 24 hours. o You may place ice or heat on the areas treated that have become sore, however, do not use heat on inflamed or bruised areas. Heat often brings more relief post needling. o You can continue your regular activities, but vigorous activity is not recommended initially after the treatment for 24 hours. o DN is best combined with other physical therapy such as strengthening, stretching, and other therapies.    Resisted External Rotation: in Neutral - Bilateral    Sit or stand, tubing in  both hands, elbows at sides, bent to 90, forearms forward. Pinch shoulder blades together and rotate forearms out. Keep elbows at sides. Repeat _10-15___ times per set. Do __2-3__ sets per session. Do __1__ sessions per day.  Scapula Adduction With Pectorals, Low   Stand in doorframe with palms against frame and arms at 45. Lean forward and squeeze shoulder blades. Hold _30-45__ seconds. Repeat __1-2_ times per session. Do _1__ sessions per day. Copyright  [employer]. All rights reserved.

## 2016-10-12 ENCOUNTER — Ambulatory Visit (INDEPENDENT_AMBULATORY_CARE_PROVIDER_SITE_OTHER): Payer: Medicare Other | Admitting: Physical Therapy

## 2016-10-12 DIAGNOSIS — M545 Low back pain, unspecified: Secondary | ICD-10-CM

## 2016-10-12 DIAGNOSIS — R2689 Other abnormalities of gait and mobility: Secondary | ICD-10-CM

## 2016-10-12 DIAGNOSIS — M6281 Muscle weakness (generalized): Secondary | ICD-10-CM | POA: Diagnosis not present

## 2016-10-12 DIAGNOSIS — M25612 Stiffness of left shoulder, not elsewhere classified: Secondary | ICD-10-CM | POA: Diagnosis not present

## 2016-10-12 DIAGNOSIS — M25512 Pain in left shoulder: Secondary | ICD-10-CM | POA: Diagnosis not present

## 2016-10-12 DIAGNOSIS — G8929 Other chronic pain: Secondary | ICD-10-CM | POA: Diagnosis not present

## 2016-10-12 NOTE — Therapy (Signed)
[place], [place], [address] Phone: [phone]   Fax:  [phone]  Physical Therapy Treatment  Patient Details  Name: Jeff Green MRN: [id number] Date of Birth: [birth year] Referring Provider: Dr Dianah Field back and shoulder  Encounter Date: 10/12/2016      PT End of Session - 10/12/16 1019    Visit Number 7   Number of Visits 14   Date for PT Re-Evaluation 11/07/16   PT Start Time 1019   PT Stop Time 1119   PT Time Calculation (min) 60 min   Activity Tolerance Patient tolerated treatment well      Past Medical History:  Diagnosis Date  . Atrial fibrillation ([place])   . CAD (coronary artery disease)    Stent placed; [place]  . Congestive heart failure (CHF) ([place])   . DVT (deep vein thrombosis) in pregnancy ([place])   . High cholesterol   . Hypertension   . Pulmonary embolus [facility])     Past Surgical History:  Procedure Laterality Date  . HERNIA REPAIR    . IVC  2014  . lamenectomy    . left orchiectomy      There were no vitals filed for this visit.      Subjective Assessment - 10/12/16 1022    Currently in Pain? Yes   Pain Score 3   yesterday it was hurting really bad, today is not as bad.    Pain Location Coccyx   Pain Orientation Lower   Pain Descriptors / Indicators Aching;Dull   Pain Type Chronic pain   Pain Score 5   Pain Location Shoulder   Pain Orientation Left;Posterior   Pain Descriptors / Indicators Aching                         [facility] Adult PT Treatment/Exercise - 10/12/16 0001      Lumbar Exercises: Aerobic   Stationary Bike NuStep L6: 6 min   using arms and Legs, engaging post shoulder ms     Lumbar Exercises: Supine   Bridge 15 reps  with UE horizontal abd green band   Other Supine Lumbar Exercises self sacral mobs lying on a firm ball      Shoulder Exercises: Sidelying   External Rotation --  attempted, to  painful stopped     Shoulder Exercises: Isometric Strengthening   External Rotation 5X10"  2 sets      Modalities   Modalities Electrical Stimulation;Moist Heat     Moist Heat Therapy   Number Minutes Moist Heat 15 Minutes   Moist Heat Location Shoulder  Lt and sacrum     Electrical Stimulation   Electrical Stimulation Location sacrum and Lt shoulder complex   Electrical Stimulation Action premod   Electrical Stimulation Parameters to tolerance   Electrical Stimulation Goals Tone;Pain     Manual Therapy   Manual Therapy Soft tissue mobilization   Soft tissue mobilization Lt shoulder/deltoid and pecs    Myofascial Release to sacrum bilat sides, decreased tigthness palpable.           Trigger Point Dry Needling - 10/12/16 1101    Consent Given? Yes   Education Handout Provided No   Muscles Treated Upper Body Pectoralis minor;Pectoralis major  deltoid Lt                   PT Long Term Goals - 10/10/16 1227      PT  LONG TERM GOAL #1   Title Improve standing posture, alignment and balance with patient to demonstrate good upright posture with improve neuromotor control 11/07/16   Time 4   Period Weeks   Status On-going     PT LONG TERM GOAL #2   Title Improve standing and walking tolerance to 20-30 min with decreased pain to 2/10 to 3/10  by 11/07/16   Time 4   Status Partially Met  pain is decreasing and tolerating 10-15 minutes     PT LONG TERM GOAL #3   Title Increase strength bilat LE's to 5/-/5 to 5/5 throughout 11/07/16   Time 4   Period Weeks   Status On-going     PT LONG TERM GOAL #4   Title Independent in HEP 11/07/16   Time 4   Period Weeks   Status On-going     PT LONG TERM GOAL #5   Title Improve FOTO to </= 52% limitation 11/07/16   Time 4   Period Weeks   Status On-going  scored 62% limited ( was 66% at initial visit.      PT LONG TERM GOAL #6   Title report =/> 75% reduction of Lt shoulder pain to allow restoration of ROM (11/07/16)     Time 4   Period Weeks   Status New     PT LONG TERM GOAL #7   Title demo =/> 5+/5 strength of Lt shoulder without pain ( 11/07/16)    Time 4   Period Weeks   Status New               Plan - 10/12/16 1453    Clinical Impression Statement Jeff Green had some changes in his shoulder pain after the last visit, he still has significant catching/pinching inthe anterior Lt shoulder.  Pain in his sacrum is less and he has decreased tightness.    Rehab Potential Good   PT Frequency 2x / week   PT Duration 4 weeks   PT Treatment/Interventions Patient/family education;ADLs/Self Care Home Management;Cryotherapy;Electrical Stimulation;Iontophoresis 78m/ml Dexamethasone;Moist Heat;Ultrasound;Dry needling;Manual techniques;Therapeutic activities;Therapeutic exercise;Balance training;Neuromuscular re-education;Gait training   PT Next Visit Plan assess response to DN of Lt shoulder, cont manual work to sacrum and progress Lt shoulder PRN   Consulted and Agree with Plan of Care Patient      Patient will benefit from skilled therapeutic intervention in order to improve the following deficits and impairments:  Postural dysfunction, Improper body mechanics, Pain, Decreased strength, Decreased mobility, Decreased range of motion, Abnormal gait, Decreased balance, Decreased activity tolerance, Impaired UE functional use, Increased muscle spasms  Visit Diagnosis: Other abnormalities of gait and mobility  Muscle weakness (generalized)  Chronic bilateral low back pain without sciatica  Chronic left shoulder pain  Stiffness of left shoulder, not elsewhere classified     Problem List Patient Active Problem List   Diagnosis Date Noted  . Diabetes mellitus type 2, diet-controlled ([place] 06/08/2016  . Seborrheic dermatitis 06/08/2016  . Morbid obesity ([place] 06/08/2016  . Ruptured tympanic membrane, bilateral 01/27/2016  . Annual physical exam 01/25/2016  . Paroxysmal atrial fibrillation ([place]  06/15/2015  . Polycythemia, secondary 08/26/2014  . Hemochromatosis 08/26/2014  . Multiple pulmonary nodules 08/16/2014  . Testicular cancer ([place] 08/12/2014  . Hyperlipidemia 08/10/2014  . Hypogonadism in male 08/10/2014  . Essential hypertension 08/10/2014  . Spinal stenosis of lumbar region 08/10/2014  . Recurrent pulmonary emboli ([place] 08/10/2014  . CAD (coronary artery disease), native coronary artery 08/10/2014    SJeral PinchPT  10/12/2016, [place], [place], [address] Phone: [phone]   Fax:  [phone]  Name: Jeff Green MRN: [id number] Date of Birth: 05/17/1955

## 2016-10-12 NOTE — Patient Instructions (Signed)
TENS UNIT: This is helpful for muscle pain and spasm.   Search and Purchase a TENS 7000 2nd edition at www.tenspros.com. It should be less than $30.     TENS unit instructions: Do not shower or bathe with the unit on Turn the unit off before removing electrodes or batteries If the electrodes lose stickiness add a drop of water to the electrodes after they are disconnected from the unit and place on plastic sheet. If you continued to have difficulty, call the TENS unit company to purchase more electrodes. Do not apply lotion on the skin area prior to use. Make sure the skin is clean and dry as this will help prolong the life of the electrodes. After use, always check skin for unusual red areas, rash or other skin difficulties. If there are any skin problems, does not apply electrodes to the same area. Never remove the electrodes from the unit by pulling the wires. Do not use the TENS unit or electrodes other than as directed. Do not change electrode placement without consultating your therapist or physician. Keep 2 fingers with between each electrode. Wear time ratio is 2:1, on to off times.    For example on for 30 minutes off for 15 minutes and then on for 30 minutes off for 15 minutes  TENS stands for Transcutaneous Electrical Nerve Stimulation. In other words, electrical impulses are allowed to pass through the skin in order to excite a nerve.   Purpose and Use of TENS:  TENS is a method used to manage acute and chronic pain without the use of drugs. It has been effective in managing pain associated with surgery, sprains, strains, trauma, rheumatoid arthritis, and neuralgias. It is a non-addictive, low risk, and non-invasive technique used to control pain. It is not, by any means, a curative form of treatment.   How TENS Works:  Most TENS units are a Paramedic unit powered by one 9 volt battery. Attached to the outside of the unit are two lead wires where two pins and/or snaps  connect on each wire. All units come with a set of four reusable pads or electrodes. These are placed on the skin surrounding the area involved. By inserting the leads into  the pads, the electricity can pass from the unit making the circuit complete.  As the intensity is turned up slowly, the electrical current enters the body from the electrodes through the skin to the surrounding nerve fibers. This triggers the release of hormones from within the body. These hormones contain pain relievers. By increasing the circulation of these hormones, the person's pain may be lessened. It is also believed that the electrical stimulation itself helps to block the pain messages being sent to the brain, thus also decreasing the body's perception of pain.   Hazards:  TENS units are NOT to be used by patients with PACEMAKERS, DEFIBRILLATORS, DIABETIC PUMPS, PREGNANT WOMEN, and patients with SEIZURE DISORDERS.  TENS units are NOT to be used over the heart, throat, brain, or spinal cord.  One of the major side effects from the TENS unit may be skin irritation. Some people may develop a rash if they are sensitive to the materials used in the electrodes or the connecting wires.    Avoid overuse due the body getting used to the stem making it not as effective over time.

## 2016-10-15 DIAGNOSIS — M79606 Pain in leg, unspecified: Secondary | ICD-10-CM | POA: Diagnosis not present

## 2016-10-15 DIAGNOSIS — Z79899 Other long term (current) drug therapy: Secondary | ICD-10-CM | POA: Diagnosis not present

## 2016-10-15 DIAGNOSIS — G894 Chronic pain syndrome: Secondary | ICD-10-CM | POA: Diagnosis not present

## 2016-10-15 DIAGNOSIS — M961 Postlaminectomy syndrome, not elsewhere classified: Secondary | ICD-10-CM | POA: Diagnosis not present

## 2016-10-15 DIAGNOSIS — Z79891 Long term (current) use of opiate analgesic: Secondary | ICD-10-CM | POA: Diagnosis not present

## 2016-10-15 DIAGNOSIS — M545 Low back pain: Secondary | ICD-10-CM | POA: Diagnosis not present

## 2016-10-16 ENCOUNTER — Ambulatory Visit (INDEPENDENT_AMBULATORY_CARE_PROVIDER_SITE_OTHER): Payer: Medicare Other | Admitting: Physical Therapy

## 2016-10-16 DIAGNOSIS — M545 Low back pain, unspecified: Secondary | ICD-10-CM

## 2016-10-16 DIAGNOSIS — R2689 Other abnormalities of gait and mobility: Secondary | ICD-10-CM | POA: Diagnosis not present

## 2016-10-16 DIAGNOSIS — M6281 Muscle weakness (generalized): Secondary | ICD-10-CM

## 2016-10-16 DIAGNOSIS — M25512 Pain in left shoulder: Secondary | ICD-10-CM

## 2016-10-16 DIAGNOSIS — M25612 Stiffness of left shoulder, not elsewhere classified: Secondary | ICD-10-CM | POA: Diagnosis not present

## 2016-10-16 DIAGNOSIS — G8929 Other chronic pain: Secondary | ICD-10-CM | POA: Diagnosis not present

## 2016-10-16 NOTE — Therapy (Signed)
[place], [place], [address] Phone: [phone]   Fax:  [phone]  Physical Therapy Treatment  Patient Details  Name: Jeff Green MRN: [id number] Date of Birth: 07-Jul-1955 Referring Provider: Dr Dianah Field back and shoulder  Encounter Date: 10/16/2016      PT End of Session - 10/16/16 0806    Visit Number 8   Number of Visits 14   Date for PT Re-Evaluation 11/07/16   PT Start Time 0807   PT Stop Time 0924   PT Time Calculation (min) 77 min   Activity Tolerance Patient tolerated treatment well      Past Medical History:  Diagnosis Date  . Atrial fibrillation ([place])   . CAD (coronary artery disease)    Stent placed; [place]  . Congestive heart failure (CHF) ([place])   . DVT (deep vein thrombosis) in pregnancy ([place])   . High cholesterol   . Hypertension   . Pulmonary embolus [facility])     Past Surgical History:  Procedure Laterality Date  . HERNIA REPAIR    . IVC  2014  . lamenectomy    . left orchiectomy      There were no vitals filed for this visit.      Subjective Assessment - 10/16/16 0808    Subjective Jeff Green reports he is doing ok, still having shoulder issues. wishes to focus on the shoulder today,  Did have a couple episodes of sharp coccyx on Saturday.    Patient Stated Goals to strengthen back and come out feeling a whole lot better; to be able to walk comfortably, have less pain in Lt shoulder and be able to lift it again without pain   Currently in Pain? Yes   Pain Score 1    Pain Location Coccyx   Pain Orientation Lower   Pain Descriptors / Indicators Dull   Pain Type Chronic pain   Pain Onset More than a month ago   Pain Frequency Intermittent   Pain Score 4   Pain Location Shoulder   Pain Orientation Left;Posterior   Pain Descriptors / Indicators Aching  sharp with certian movements    Pain Type Chronic pain   Pain Onset More than a month ago    Pain Frequency Constant   Aggravating Factors  lifting   Pain Relieving Factors not sure            [facility] PT Assessment - 10/16/16 0001      Assessment   Medical Diagnosis LBP , Lt shoulder pain                     [facility] Adult PT Treatment/Exercise - 10/16/16 0001      Lumbar Exercises: Aerobic   Stationary Bike NuStep L6: 6 min   arms and legs     Shoulder Exercises: Seated   Other Seated Exercises attempted full can to painful and pt with a lot of compentsatory motion so we stopped.      Shoulder Exercises: Prone   Other Prone Exercises low trap work with arm off the EOB, 2x 10 reps followed by isometric hold, same with mid trap     Shoulder Exercises: Stretch   Other Shoulder Stretches posterior Lt shoulder stretching, pec stretches, tricep stretch.      Modalities   Modalities Electrical Stimulation;Moist Heat     Moist Heat Therapy   Number Minutes Moist Heat 20 Minutes   Moist Heat Location Shoulder  Lt and sacrum     Acupuncturist Stimulation Location sacrum and Lt shoulder complex   Electrical Stimulation Action premod   Electrical Stimulation Parameters to tolerance   Electrical Stimulation Goals Tone;Pain     Manual Therapy   Manual Therapy Soft tissue mobilization;Joint mobilization   Joint Mobilization Lt scapular mobs in all directions in s/l, AP GH mobs grade III with stretching into ER   Soft tissue mobilization Lt shoulder pecs/post shoulder and upper traps.           Trigger Point Dry Needling - 10/16/16 4431    Consent Given? Yes   Education Handout Provided No   Muscles Treated Upper Body Pectoralis major;Pectoralis minor;Supraspinatus;Infraspinatus;Subscapularis  all Lt side   Pectoralis Major Response Palpable increased muscle length;Twitch response elicited   Pectoralis Minor Response Twitch response elicited;Palpable increased muscle length   Supraspinatus Response Palpable increased muscle  length;Twitch response elicited   Infraspinatus Response Twitch response elicited;Palpable increased muscle length   Subscapularis Response Palpable increased muscle length;Twitch response elicited                   PT Long Term Goals - 10/16/16 0947      PT LONG TERM GOAL #1   Title Improve standing posture, alignment and balance with patient to demonstrate good upright posture with improve neuromotor control 11/07/16   Status On-going     PT LONG TERM GOAL #2   Title Improve standing and walking tolerance to 20-30 min with decreased pain to 2/10 to 3/10  by 11/07/16   Status Partially Met     PT LONG TERM GOAL #3   Title Increase strength bilat LE's to 5/-/5 to 5/5 throughout 11/07/16   Status On-going     PT LONG TERM GOAL #4   Title Independent in HEP 11/07/16   Status On-going     PT LONG TERM GOAL #5   Title Improve FOTO to </= 52% limitation 11/07/16   Status On-going     PT LONG TERM GOAL #6   Title report =/> 75% reduction of Lt shoulder pain to allow restoration of ROM (11/07/16)    Status On-going     PT LONG TERM GOAL #7   Title demo =/> 5+/5 strength of Lt shoulder without pain ( 11/07/16)    Status On-going               Plan - 10/16/16 0948    Clinical Impression Statement Jeff Green is having decreased frequency and intensity of coccyx pain,  his shoulder is having decreased tightness and some improved ROM however still has pain.  He is limited in the exercises he can perform due to pain with certain motions.  He has compensatory motion with all overhead activiites, weakness in the posterior shoulder musculature.    PT Frequency 2x / week   PT Duration 4 weeks   PT Treatment/Interventions Patient/family education;ADLs/Self Care Home Management;Cryotherapy;Electrical Stimulation;Iontophoresis 54m/ml Dexamethasone;Moist Heat;Ultrasound;Dry needling;Manual techniques;Therapeutic activities;Therapeutic exercise;Balance training;Neuromuscular  re-education;Gait training   PT Next Visit Plan assess response to DN of Lt shoulder, cont manual work to sacrum and progress Lt shoulder PRN   Consulted and Agree with Plan of Care Patient      Patient will benefit from skilled therapeutic intervention in order to improve the following deficits and impairments:  Postural dysfunction, Improper body mechanics, Pain, Decreased strength, Decreased mobility, Decreased range of motion, Abnormal gait, Decreased balance, Decreased activity tolerance, Impaired UE functional use, Increased muscle  spasms  Visit Diagnosis: Other abnormalities of gait and mobility  Muscle weakness (generalized)  Chronic bilateral low back pain without sciatica  Chronic left shoulder pain  Stiffness of left shoulder, not elsewhere classified     Problem List Patient Active Problem List   Diagnosis Date Noted  . Diabetes mellitus type 2, diet-controlled ([place]) 06/08/2016  . Seborrheic dermatitis 06/08/2016  . Morbid obesity ([place]) 06/08/2016  . Ruptured tympanic membrane, bilateral 01/27/2016  . Annual physical exam 01/25/2016  . Paroxysmal atrial fibrillation ([place]) 06/15/2015  . Polycythemia, secondary 08/26/2014  . Hemochromatosis 08/26/2014  . Multiple pulmonary nodules 08/16/2014  . Testicular cancer ([place]) 08/12/2014  . Hyperlipidemia 08/10/2014  . Hypogonadism in male 08/10/2014  . Essential hypertension 08/10/2014  . Spinal stenosis of lumbar region 08/10/2014  . Recurrent pulmonary emboli ([place]) 08/10/2014  . CAD (coronary artery disease), native coronary artery 08/10/2014    [name] PT  10/16/2016, 9:50 AM  [facility] [place], [place], [address] Phone: [phone]   Fax:  [phone]  Name: Jeff Green MRN: [id number] Date of Birth: 02/15/1956

## 2016-10-17 ENCOUNTER — Encounter: Payer: Self-pay | Admitting: Rehabilitative and Restorative Service Providers"

## 2016-10-17 ENCOUNTER — Ambulatory Visit (INDEPENDENT_AMBULATORY_CARE_PROVIDER_SITE_OTHER): Payer: Medicare Other | Admitting: Rehabilitative and Restorative Service Providers"

## 2016-10-17 DIAGNOSIS — M25612 Stiffness of left shoulder, not elsewhere classified: Secondary | ICD-10-CM | POA: Diagnosis not present

## 2016-10-17 DIAGNOSIS — M545 Low back pain, unspecified: Secondary | ICD-10-CM

## 2016-10-17 DIAGNOSIS — R2689 Other abnormalities of gait and mobility: Secondary | ICD-10-CM | POA: Diagnosis not present

## 2016-10-17 DIAGNOSIS — G8929 Other chronic pain: Secondary | ICD-10-CM

## 2016-10-17 DIAGNOSIS — M25512 Pain in left shoulder: Secondary | ICD-10-CM

## 2016-10-17 DIAGNOSIS — M6281 Muscle weakness (generalized): Secondary | ICD-10-CM

## 2016-10-17 NOTE — Therapy (Signed)
[place], [place], [address] Phone: [phone]   Fax:  [phone]  Physical Therapy Treatment  Patient Details  Name: Jeff Green MRN: [id number] Date of Birth: [birth year] Referring Provider: Dr Dianah Field back and shoulder  Encounter Date: 10/17/2016      PT End of Session - 10/17/16 1406    Visit Number 9   Number of Visits 14   Date for PT Re-Evaluation 11/07/16   PT Start Time 2111   PT Stop Time 1500   PT Time Calculation (min) 57 min   Activity Tolerance Patient tolerated treatment well      Past Medical History:  Diagnosis Date  . Atrial fibrillation ([place])   . CAD (coronary artery disease)    Stent placed; [place]  . Congestive heart failure (CHF) ([place])   . DVT (deep vein thrombosis) in pregnancy ([place])   . High cholesterol   . Hypertension   . Pulmonary embolus [facility])     Past Surgical History:  Procedure Laterality Date  . HERNIA REPAIR    . IVC  2014  . lamenectomy    . left orchiectomy      There were no vitals filed for this visit.      Subjective Assessment - 10/17/16 1410    Subjective Shoulder was sore this am but is feeling looser and better now. Some pain in the coccyx over the weekend after sitting at the movie.   Currently in Pain? Yes   Pain Score 2    Pain Location Coccyx   Pain Orientation Lower   Pain Descriptors / Indicators Dull   Pain Type Chronic pain   Pain Onset More than a month ago   Pain Score 4   Pain Location Shoulder   Pain Orientation Left;Anterior;Posterior   Pain Descriptors / Indicators Aching;Tightness   Pain Type Chronic pain   Pain Onset More than a month ago   Pain Frequency Constant                         [facility] Adult PT Treatment/Exercise - 10/17/16 0001      Self-Care   Self-Care --  postural correction      Neuro Re-ed    Neuro Re-ed Details  working on posture and alignment  engaging posterior shoulder girdle      Lumbar Exercises: Prone   Other Prone Lumbar Exercises contract gluts with myofacial release of PT 5 sec x 10      Shoulder Exercises: Standing   Other Standing Exercises scap squeeze with noodle 10 sec x 10      Shoulder Exercises: Stretch   Corner Stretch 3 reps;30 seconds  low and mid positions to pt tolerance    Other Shoulder Stretches biceps stretch 30 sec x 3      Modalities   Modalities Electrical Stimulation;Moist Heat     Moist Heat Therapy   Number Minutes Moist Heat 20 Minutes   Moist Heat Location Shoulder  Lt and sacrum     Electrical Stimulation   Electrical Stimulation Location sacrum and Lt shoulder complex   Electrical Stimulation Action IFC lumbar; TENS unit shd   Electrical Stimulation Parameters to tolerance   Electrical Stimulation Goals Tone;Pain     Manual Therapy   Manual Therapy Soft tissue mobilization;Joint mobilization   Soft tissue mobilization lumbar to sacral and coccyx area bilat - tighter Rt than Lt  Myofascial Release buttocks - stretch with glut contraction           Trigger Point Dry Needling - 10/16/16 9211    Consent Given? Yes   Education Handout Provided No   Muscles Treated Upper Body Pectoralis major;Pectoralis minor;Supraspinatus;Infraspinatus;Subscapularis  all Lt side   Pectoralis Major Response Palpable increased muscle length;Twitch response elicited   Pectoralis Minor Response Twitch response elicited;Palpable increased muscle length   Supraspinatus Response Palpable increased muscle length;Twitch response elicited   Infraspinatus Response Twitch response elicited;Palpable increased muscle length   Subscapularis Response Palpable increased muscle length;Twitch response elicited              PT Education - 10/17/16 1429    Education provided Yes   Education Details HEP posture and alignmnet; TENS unit    Person(s) Educated Patient   Methods  Explanation;Demonstration;Tactile cues;Verbal cues   Comprehension Verbalized understanding;Returned demonstration;Verbal cues required;Tactile cues required             PT Long Term Goals - 10/16/16 0947      PT LONG TERM GOAL #1   Title Improve standing posture, alignment and balance with patient to demonstrate good upright posture with improve neuromotor control 11/07/16   Status On-going     PT LONG TERM GOAL #2   Title Improve standing and walking tolerance to 20-30 min with decreased pain to 2/10 to 3/10  by 11/07/16   Status Partially Met     PT LONG TERM GOAL #3   Title Increase strength bilat LE's to 5/-/5 to 5/5 throughout 11/07/16   Status On-going     PT LONG TERM GOAL #4   Title Independent in HEP 11/07/16   Status On-going     PT LONG TERM GOAL #5   Title Improve FOTO to </= 52% limitation 11/07/16   Status On-going     PT LONG TERM GOAL #6   Title report =/> 75% reduction of Lt shoulder pain to allow restoration of ROM (11/07/16)    Status On-going     PT LONG TERM GOAL #7   Title demo =/> 5+/5 strength of Lt shoulder without pain ( 11/07/16)    Status On-going               Plan - 10/17/16 1407    Clinical Impression Statement Jeff Green reports that his shoulder is doing better with the dry needling. He has some soreness in the shoulder area in the front and back. He has most soreness in the morning. The back is improving and he is more active. He still has some pain down into the sacrum. He is progressing well toward stated goals of therapy.    Rehab Potential Good   PT Frequency 2x / week   PT Duration 4 weeks   PT Treatment/Interventions Patient/family education;ADLs/Self Care Home Management;Cryotherapy;Electrical Stimulation;Iontophoresis 53m/ml Dexamethasone;Moist Heat;Ultrasound;Dry needling;Manual techniques;Therapeutic activities;Therapeutic exercise;Balance training;Neuromuscular re-education;Gait training   PT Next Visit Plan continue DN of Lt  shoulder and manual work as indicated to Lt shoulder and sacrum: progress Lt shoulder stabilization    Consulted and Agree with Plan of Care Patient      Patient will benefit from skilled therapeutic intervention in order to improve the following deficits and impairments:  Postural dysfunction, Improper body mechanics, Pain, Decreased strength, Decreased mobility, Decreased range of motion, Abnormal gait, Decreased balance, Decreased activity tolerance, Impaired UE functional use, Increased muscle spasms  Visit Diagnosis: Other abnormalities of gait and mobility  Muscle weakness (generalized)  Chronic  bilateral low back pain without sciatica  Chronic left shoulder pain  Stiffness of left shoulder, not elsewhere classified     Problem List Patient Active Problem List   Diagnosis Date Noted  . Diabetes mellitus type 2, diet-controlled ([place]) 06/08/2016  . Seborrheic dermatitis 06/08/2016  . Morbid obesity ([place]) 06/08/2016  . Ruptured tympanic membrane, bilateral 01/27/2016  . Annual physical exam 01/25/2016  . Paroxysmal atrial fibrillation ([place]) 06/15/2015  . Polycythemia, secondary 08/26/2014  . Hemochromatosis 08/26/2014  . Multiple pulmonary nodules 08/16/2014  . Testicular cancer ([place]) 08/12/2014  . Hyperlipidemia 08/10/2014  . Hypogonadism in male 08/10/2014  . Essential hypertension 08/10/2014  . Spinal stenosis of lumbar region 08/10/2014  . Recurrent pulmonary emboli ([place]) 08/10/2014  . CAD (coronary artery disease), native coronary artery 08/10/2014    Theda Payer P Rosalee [place] Pt, MPH  10/17/2016, 2:51 PM  [facility] [place], [place], [address] Phone: [phone]   Fax:  [phone]  Name: Jaikob Borgwardt MRN: [id number] Date of Birth: [birth year]

## 2016-10-17 NOTE — Patient Instructions (Signed)
ELBOW: Biceps - Standing    Standing in doorway, place one hand on wall, elbow straight. Step forward no weight on arm. Hold __30_ seconds. _3_ reps per set 2-3 times/day   Flexibility: Corner Stretch    Standing in corner with hands just above shoulder level  Step one foot forward until a comfortable stretch is felt across chest. Hold __30__ seconds. Repeat _3___ times per set.  Do __2-3__ sessions per day. Move arms up slightly and repeat - NO PAIN!! Just a stretch.   Shoulder Blade Squeeze    Rotate shoulders back, then squeeze shoulder blades down and back. Hold 10 sec Repeat ___10_ times. Do _several ___ sessions per day.

## 2016-10-18 ENCOUNTER — Ambulatory Visit (INDEPENDENT_AMBULATORY_CARE_PROVIDER_SITE_OTHER): Payer: Medicare Other | Admitting: Sports Medicine

## 2016-10-18 VITALS — BP 124/83 | HR 80 | Wt 317.0 lb

## 2016-10-18 DIAGNOSIS — I10 Essential (primary) hypertension: Secondary | ICD-10-CM

## 2016-10-18 DIAGNOSIS — C6290 Malignant neoplasm of unspecified testis, unspecified whether descended or undescended: Secondary | ICD-10-CM | POA: Diagnosis not present

## 2016-10-18 DIAGNOSIS — E119 Type 2 diabetes mellitus without complications: Secondary | ICD-10-CM | POA: Diagnosis not present

## 2016-10-18 DIAGNOSIS — I480 Paroxysmal atrial fibrillation: Secondary | ICD-10-CM | POA: Diagnosis not present

## 2016-10-18 DIAGNOSIS — E785 Hyperlipidemia, unspecified: Secondary | ICD-10-CM

## 2016-10-18 DIAGNOSIS — Z23 Encounter for immunization: Secondary | ICD-10-CM

## 2016-10-18 DIAGNOSIS — Z0000 Encounter for general adult medical examination without abnormal findings: Secondary | ICD-10-CM

## 2016-10-18 LAB — POCT INR: INR: 2.9

## 2016-10-18 MED ORDER — LORCASERIN HCL ER 20 MG PO TB24: 1.0000 | ORAL_TABLET | Freq: Every day | ORAL | 0 refills | Status: DC

## 2016-10-18 MED ORDER — WARFARIN SODIUM 7.5 MG PO TABS: 7.5000 mg | ORAL_TABLET | Freq: Every day | ORAL | 0 refills | Status: DC

## 2016-10-18 NOTE — Assessment & Plan Note (Signed)
Prescription given for Belviq, to avoid the sympathomimetics considering coronary artery disease and atrial fibrillation.

## 2016-10-18 NOTE — Assessment & Plan Note (Signed)
Beautifully controlled on current medications.

## 2016-10-18 NOTE — Progress Notes (Signed)
  Subjective:    CC: Multiple issues  HPI: Hypertension: Now beautifully controlled.  Hyperlipidemia: Elevated, due for a recheck.  Diabetes mellitus type 2: Diet controlled.  Obesity: Would like to start weight loss medication. He does have a history of coronary artery disease and A. fib so understands we can't use a sympathomimetic.  Hypergonadism: Due for testosterone rechecked.  Atrial fibrillation: INR is 2.9 today. No signs of bleeding, no symptoms of bleeding.  Past medical history:  Negative.  See flowsheet/record as well for more information.  Surgical history: Negative.  See flowsheet/record as well for more information.  Family history: Negative.  See flowsheet/record as well for more information.  Social history: Negative.  See flowsheet/record as well for more information.  Allergies, and medications have been entered into the medical record, reviewed, and no changes needed.   Review of Systems: No fevers, chills, night sweats, weight loss, chest pain, or shortness of breath.   Objective:    General: Well Developed, well nourished, and in no acute distress.  Neuro: Alert and oriented x3, extra-ocular muscles intact, sensation grossly intact.  HEENT: Normocephalic, atraumatic, pupils equal round reactive to light, neck supple, no masses, no lymphadenopathy, thyroid nonpalpable.  Skin: Warm and dry, no rashes. Cardiac: Regular rate and rhythm, no murmurs rubs or gallops, no lower extremity edema.  Respiratory: Clear to auscultation bilaterally. Not using accessory muscles, speaking in full sentences.  Impression and Recommendations:    Diabetes mellitus type 2, diet-controlled ([place]) Diet controlled, rechecking labs today.  Essential hypertension Beautifully controlled on current medications.  Hyperlipidemia Rechecking lipids.  Paroxysmal atrial fibrillation ([facility]) INR is therapeutic. Recheck in one month.  Testicular cancer Rechecking testosterone  levels.  Annual physical exam Due for TDap today.  Morbid obesity ([place]) Prescription given for Belviq, to avoid the sympathomimetics considering coronary artery disease and atrial fibrillation.

## 2016-10-18 NOTE — Assessment & Plan Note (Signed)
Rechecking lipids. 

## 2016-10-18 NOTE — Assessment & Plan Note (Signed)
Diet controlled, rechecking labs today.

## 2016-10-18 NOTE — Assessment & Plan Note (Signed)
Due for TDap today.

## 2016-10-18 NOTE — Assessment & Plan Note (Signed)
Rechecking testosterone levels. 

## 2016-10-18 NOTE — Assessment & Plan Note (Signed)
INR is therapeutic. Recheck in one month.

## 2016-10-19 ENCOUNTER — Ambulatory Visit: Payer: Medicare Other | Admitting: Sports Medicine

## 2016-10-19 ENCOUNTER — Encounter: Payer: Medicare Other | Admitting: Rehabilitative and Restorative Service Providers"

## 2016-10-23 ENCOUNTER — Ambulatory Visit (INDEPENDENT_AMBULATORY_CARE_PROVIDER_SITE_OTHER): Payer: Medicare Other | Admitting: Physical Therapy

## 2016-10-23 ENCOUNTER — Encounter: Payer: Self-pay | Admitting: Physical Therapy

## 2016-10-23 DIAGNOSIS — G8929 Other chronic pain: Secondary | ICD-10-CM

## 2016-10-23 DIAGNOSIS — M6281 Muscle weakness (generalized): Secondary | ICD-10-CM | POA: Diagnosis present

## 2016-10-23 DIAGNOSIS — M545 Low back pain, unspecified: Secondary | ICD-10-CM

## 2016-10-23 DIAGNOSIS — M25612 Stiffness of left shoulder, not elsewhere classified: Secondary | ICD-10-CM

## 2016-10-23 DIAGNOSIS — M25512 Pain in left shoulder: Secondary | ICD-10-CM

## 2016-10-23 NOTE — Therapy (Signed)
[place], [place], [address] Phone: [phone]   Fax:  [phone]  Physical Therapy Treatment  Patient Details  Name: Jeff Green MRN: [id number] Date of Birth: Jan 20, 1956 Referring Provider: Dr Dianah Field  Encounter Date: 10/23/2016      PT End of Session - 10/23/16 1045    Visit Number 10   Number of Visits 13   Date for PT Re-Evaluation 11/07/16   PT Start Time 0928   PT Stop Time 1029   PT Time Calculation (min) 61 min   Activity Tolerance Patient tolerated treatment well;Patient limited by pain      Past Medical History:  Diagnosis Date  . Atrial fibrillation ([place])   . CAD (coronary artery disease)    Stent placed; [place]  . Congestive heart failure (CHF) ([place])   . DVT (deep vein thrombosis) in pregnancy ([place])   . High cholesterol   . Hypertension   . Pulmonary embolus [facility])     Past Surgical History:  Procedure Laterality Date  . HERNIA REPAIR    . IVC  2014  . lamenectomy    . left orchiectomy      There were no vitals filed for this visit.      Subjective Assessment - 10/23/16 1026    Subjective Patient is feeling sharp, achy pain in left shoulder, mostly in front and a little in the back. He feels like the tightness in shoulder has returned. Patient was driving a lot over the weekend, sometimes using his left arm while driving, but trying to rest it. No complaints of pain in coccyx area.   Pertinent History Back injury 1988; bilat knee pain; cardiac stent ~5 yrs ago    How long can you sit comfortably? 5 min   How long can you stand comfortably? 5 min    How long can you walk comfortably? 5-10 min    Diagnostic tests MRI Xray    Patient Stated Goals to strengthen back and come out feeling a whole lot better; to be able to walk comfortably, have less pain in Lt shoulder and be able to lift it again without pain   Currently in Pain? Yes   Pain  Score 6  Achy pain.   Worsened by reaching, lifting arm. Improved by icing, heating, resting arm, TENS            [facility] PT Assessment - 10/23/16 0001      Assessment   Medical Diagnosis LBP , Lt shoulder pain   Referring Provider Dr Dianah Field   Onset Date/Surgical Date 02/08/16  07/2016 for shoulder   Hand Dominance Right     AROM   Left Shoulder Flexion 140 Degrees -supine, pain at 128 degrees   Left Shoulder ABduction 86 Degrees -supine, with pain   Left Shoulder Internal Rotation 28 Degrees supine, abdct 60 deg   Left Shoulder External Rotation 22 Degrees- supine abdct 60 deg          [facility] Adult PT Treatment/Exercise - 10/23/16 0001      Exercises   Exercises Shoulder     Shoulder Exercises: Supine   External Rotation Strengthening;Both;10 reps;Theraband   Theraband Level (Shoulder External Rotation) Level 1 ([place])   Flexion AAROM;Both;10 reps;Theraband  overhead pull    Theraband Level (Shoulder Flexion) Level 1 ([place])     Shoulder Exercises: ROM/Strengthening   UBE (Upper Arm Bike) L1: 4 min, alt forward/backward      Shoulder  Exercises: IT sales professional 3 reps;30 seconds  low and mid positions to pt tolerance    Corner Stretch Limitations (performed in doorway; difficult tolerating due to increased pain. some substitution noted. )     Modalities   Modalities Passenger transport manager Location Lt shoulder   Electrical Stimulation Action IFC   Electrical Stimulation Parameters to tolerance   Electrical Stimulation Goals Pain     Vasopneumatic   Number Minutes Vasopneumatic  15 minutes   Vasopnuematic Location  Shoulder  Lt   Vasopneumatic Pressure Low   Vasopneumatic Temperature  34     Manual Therapy   Manual Therapy Soft tissue mobilization;Joint mobilization   Manual therapy comments pt supine   Soft tissue mobilization Lt shoulder pecs, lat, teres major, TPR to  subscap.    Myofascial Release to Lt pec major, latissimus dorsi.             PT Long Term Goals - 10/16/16 0947      PT LONG TERM GOAL #1   Title Improve standing posture, alignment and balance with patient to demonstrate good upright posture with improve neuromotor control 11/07/16   Status On-going     PT LONG TERM GOAL #2   Title Improve standing and walking tolerance to 20-30 min with decreased pain to 2/10 to 3/10  by 11/07/16   Status Partially Met     PT LONG TERM GOAL #3   Title Increase strength bilat LE's to 5/-/5 to 5/5 throughout 11/07/16   Status On-going     PT LONG TERM GOAL #4   Title Independent in HEP 11/07/16   Status On-going     PT LONG TERM GOAL #5   Title Improve FOTO to </= 52% limitation 11/07/16   Status On-going     PT LONG TERM GOAL #6   Title report =/> 75% reduction of Lt shoulder pain to allow restoration of ROM (11/07/16)    Status On-going     PT LONG TERM GOAL #7   Title demo =/> 5+/5 strength of Lt shoulder without pain ( 11/07/16)    Status On-going               Plan - 10/23/16 1049    Clinical Impression Statement Patient reports that left shoulder pain was at a level 6 at begin of treatment, and level 4 at the end of treatment. He has decreased left shoulder range of motion than last time measured. He is performing home exercises to tolerance and using his TENS unit to help ease pain and discomfort.    Rehab Potential Good   PT Frequency 2x / week   PT Duration 4 weeks   PT Treatment/Interventions Patient/family education;ADLs/Self Care Home Management;Cryotherapy;Electrical Stimulation;Iontophoresis 25m/ml Dexamethasone;Moist Heat;Ultrasound;Dry needling;Manual techniques;Therapeutic activities;Therapeutic exercise;Balance training;Neuromuscular re-education;Gait training   PT Next Visit Plan DN of left shoulder and manual work as indicated to left shoulder and sacrum. Progress with lt shoulder stretching and strengthening.    Consulted and Agree with Plan of Care Patient      Patient will benefit from skilled therapeutic intervention in order to improve the following deficits and impairments:  Postural dysfunction, Improper body mechanics, Pain, Decreased strength, Decreased mobility, Decreased range of motion, Abnormal gait, Decreased balance, Decreased activity tolerance, Impaired UE functional use, Increased muscle spasms  Visit Diagnosis: Muscle weakness (generalized)  Chronic bilateral low back pain without sciatica  Chronic left shoulder pain  Stiffness of  left shoulder, not elsewhere classified     Problem List Patient Active Problem List   Diagnosis Date Noted  . Diabetes mellitus type 2, diet-controlled ([place]) 06/08/2016  . Seborrheic dermatitis 06/08/2016  . Morbid obesity ([place]) 06/08/2016  . Ruptured tympanic membrane, bilateral 01/27/2016  . Annual physical exam 01/25/2016  . Paroxysmal atrial fibrillation ([place]) 06/15/2015  . Polycythemia, secondary 08/26/2014  . Hemochromatosis 08/26/2014  . Multiple pulmonary nodules 08/16/2014  . Testicular cancer ([place]) 08/12/2014  . Hyperlipidemia 08/10/2014  . Hypogonadism in male 08/10/2014  . Essential hypertension 08/10/2014  . Spinal stenosis of lumbar region 08/10/2014  . Recurrent pulmonary emboli ([place]) 08/10/2014  . CAD (coronary artery disease), native coronary artery 08/10/2014    Andria Meuse, SPTA 10/23/2016, 1:11 PM   During this treatment session, the therapist was present, participating in and directing the treatment. Kerin Perna, PTA 10/23/16 1:15 PM  [facility] [place], [place], [address] Phone: [phone]   Fax:  [phone]  Name: Jeff Green MRN: [id number] Date of Birth: July 31, 1955

## 2016-10-25 ENCOUNTER — Telehealth: Payer: Self-pay | Admitting: *Deleted

## 2016-10-25 ENCOUNTER — Encounter: Payer: Self-pay | Admitting: Rehabilitative and Restorative Service Providers"

## 2016-10-25 ENCOUNTER — Ambulatory Visit (INDEPENDENT_AMBULATORY_CARE_PROVIDER_SITE_OTHER): Payer: Medicare Other | Admitting: Rehabilitative and Restorative Service Providers"

## 2016-10-25 DIAGNOSIS — M6281 Muscle weakness (generalized): Secondary | ICD-10-CM | POA: Diagnosis not present

## 2016-10-25 DIAGNOSIS — R2689 Other abnormalities of gait and mobility: Secondary | ICD-10-CM | POA: Diagnosis not present

## 2016-10-25 DIAGNOSIS — G8929 Other chronic pain: Secondary | ICD-10-CM | POA: Diagnosis not present

## 2016-10-25 DIAGNOSIS — M25612 Stiffness of left shoulder, not elsewhere classified: Secondary | ICD-10-CM | POA: Diagnosis not present

## 2016-10-25 DIAGNOSIS — M545 Low back pain, unspecified: Secondary | ICD-10-CM

## 2016-10-25 DIAGNOSIS — M25512 Pain in left shoulder: Secondary | ICD-10-CM | POA: Diagnosis not present

## 2016-10-25 MED ORDER — METHOCARBAMOL 500 MG PO TABS: 750.0000 mg | ORAL_TABLET | Freq: Three times a day (TID) | ORAL | 3 refills | Status: DC

## 2016-10-25 NOTE — Patient Instructions (Addendum)
Resisted External Rotation: in Neutral - Bilateral   PALMS UP Sit or stand, tubing in both hands, elbows at sides, bent to 90, forearms forward. Pinch shoulder blades together and rotate forearms out. Keep elbows at sides. Repeat __10__ times per set. Do _2-3___ sets per session. Do _2-3___ sessions per day.   Low Row: Standing   Face anchor, feet shoulder width apart. Palms up, pull arms back, squeezing shoulder blades together. Repeat 10__ times per set. Do 2-3__ sets per session. Do 2-3__ sessions per say. Anchor Height: Waist     Strengthening: Resisted Extension   Hold tubing in right hand, arm forward. Pull arm back, elbow straight. Repeat _10___ times per set. Do 2-3____ sets per session. Do 2-3____ sessions per day.    Scapular Retraction: Elbow Flexion (Standing)    With elbows bent to 90, pinch shoulder blades together and rotate arms out, keeping elbows bent. Repeat ___10_ times per set. Do __1-3__ sets per session. Do __2-3 sessions per day.

## 2016-10-25 NOTE — Telephone Encounter (Signed)
Patient called and states [place] says methocarbamol 750mg  is on back order until next year. He wants to know if he can get the 500 mg tablets and take one and one half. Flexeril is the other option but he states that makes him too drowsy.Please send new prescription of methacarbamol 500 mg if appropriate.

## 2016-10-25 NOTE — Telephone Encounter (Signed)
Yes that's fine 

## 2016-10-25 NOTE — Therapy (Signed)
[place], [place], [address] Phone: [phone]   Fax:  [phone]  Physical Therapy Treatment  Patient Details  Name: Jeff Green MRN: [id number] Date of Birth: Nov 22, 1955 Referring Provider: Dr Dianah Field  Encounter Date: 10/25/2016      PT End of Session - 10/25/16 0927    Visit Number 11   Number of Visits 13   Date for PT Re-Evaluation 11/07/16   PT Start Time 0926   PT Stop Time 1030   PT Time Calculation (min) 64 min   Activity Tolerance Patient tolerated treatment well      Past Medical History:  Diagnosis Date  . Atrial fibrillation ([place])   . CAD (coronary artery disease)    Stent placed; [place]  . Congestive heart failure (CHF) ([place])   . DVT (deep vein thrombosis) in pregnancy ([place])   . High cholesterol   . Hypertension   . Pulmonary embolus [facility])     Past Surgical History:  Procedure Laterality Date  . HERNIA REPAIR    . IVC  2014  . lamenectomy    . left orchiectomy      There were no vitals filed for this visit.      Subjective Assessment - 10/25/16 0929    Subjective Patient reports that he is generally better. He was in the pool yesterday and did more walking. Last night he had spasm and "twiching" in the Lt shoudler and Rt posterior hip. He still has some tightness and discomfort this am.     Currently in Pain? Yes   Pain Score 1    Pain Location Coccyx   Pain Descriptors / Indicators Dull   Pain Score 4   Pain Location Shoulder   Pain Orientation Left;Anterior;Posterior   Pain Descriptors / Indicators Aching;Tightness   Pain Type Chronic pain   Pain Onset More than a month ago   Pain Frequency Constant                         [facility] Adult PT Treatment/Exercise - 10/25/16 0001      Shoulder Exercises: Standing   Extension Strengthening;Right;Left;10 reps;Theraband   Theraband Level (Shoulder Extension) Level 2  (Red)   Row Strengthening;Right;Left;10 reps   Theraband Level (Shoulder Row) Level 2 (Red)   Retraction Strengthening;Right;Left;10 reps;Theraband   Theraband Level (Shoulder Retraction) Level 1 (Yellow)   Other Standing Exercises scap squeeze with noodle 10 sec x 10      Shoulder Exercises: Pulleys   Flexion --  10 sec x 10      Shoulder Exercises: ROM/Strengthening   UBE (Upper Arm Bike) L1: 3 min, alt forward/backward      Shoulder Exercises: Stretch   Other Shoulder Stretches biceps stretch 30 sec x 3    Other Shoulder Stretches doorway mid position to pt tolerance - no increase in pain      Modalities   Modalities Electrical Stimulation;Vasopneumatic     Moist Heat Therapy   Number Minutes Moist Heat 20 Minutes   Moist Heat Location Shoulder  Lt     Electrical Stimulation   Electrical Stimulation Location Lt shoulder   Electrical Stimulation Action IFC   Electrical Stimulation Parameters to tolerance   Electrical Stimulation Goals Pain;Tone     Manual Therapy   Manual Therapy Soft tissue mobilization   Manual therapy comments pt supine   Soft tissue mobilization Lt shoulder pecs, biceps; deltoid  Myofascial Release to Lt pec major          Trigger Point Dry Needling - 10/25/16 1016    Consent Given? Yes   Muscles Treated Upper Body --  biceps; deltoid - Lt decresaed tightness to palpation    Pectoralis Major Response Palpable increased muscle length   Pectoralis Minor Response Palpable increased muscle length;Twitch response elicited              PT Education - 10/25/16 0952    Education provided Yes   Education Details HEP   Person(s) Educated Patient   Methods Explanation;Demonstration;Tactile cues;Verbal cues;Handout   Comprehension Verbalized understanding;Returned demonstration;Verbal cues required;Tactile cues required             PT Long Term Goals - 10/25/16 0928      PT LONG TERM GOAL #1   Title Improve standing posture,  alignment and balance with patient to demonstrate good upright posture with improve neuromotor control 11/07/16   Time 4   Period Weeks   Status On-going     PT LONG TERM GOAL #2   Title Improve standing and walking tolerance to 20-30 min with decreased pain to 2/10 to 3/10  by 11/07/16   Time 4   Period Weeks   Status On-going     PT LONG TERM GOAL #3   Title Increase strength bilat LE's to 5/-/5 to 5/5 throughout 11/07/16   Time 4   Period Weeks   Status On-going     PT LONG TERM GOAL #4   Title Independent in HEP 11/07/16   Time 4   Period Weeks   Status On-going     PT LONG TERM GOAL #5   Title Improve FOTO to </= 52% limitation 11/07/16   Time 4   Period Weeks   Status On-going     PT LONG TERM GOAL #6   Title report =/> 75% reduction of Lt shoulder pain to allow restoration of ROM (11/07/16)    Time 4   Period Weeks   Status On-going     PT LONG TERM GOAL #7   Title demo =/> 5+/5 strength of Lt shoulder without pain ( 11/07/16)    Time 4   Period Weeks   Status On-going               Plan - 10/25/16 5427    Clinical Impression Statement Some increase in Rt hip and Lt shoudler symptoms last night - some better today. Continues to have limitations in Lt shoudler mobility and strength. Tolerated stretches well today.    Rehab Potential Good   PT Frequency 2x / week   PT Duration 4 weeks   PT Treatment/Interventions Patient/family education;ADLs/Self Care Home Management;Cryotherapy;Electrical Stimulation;Iontophoresis 4mg /ml Dexamethasone;Moist Heat;Ultrasound;Dry needling;Manual techniques;Therapeutic activities;Therapeutic exercise;Balance training;Neuromuscular re-education;Gait training   PT Next Visit Plan DN of left shoulder and manual work as indicated to left shoulder and sacrum. Progress with lt shoulder stretching and strengthening.   Consulted and Agree with Plan of Care Patient      Patient will benefit from skilled therapeutic intervention in  order to improve the following deficits and impairments:  Postural dysfunction, Improper body mechanics, Pain, Decreased strength, Decreased mobility, Decreased range of motion, Abnormal gait, Decreased balance, Decreased activity tolerance, Impaired UE functional use, Increased muscle spasms  Visit Diagnosis: Muscle weakness (generalized)  Chronic bilateral low back pain without sciatica  Chronic left shoulder pain  Stiffness of left shoulder, not elsewhere classified  Other abnormalities of gait  and mobility     Problem List Patient Active Problem List   Diagnosis Date Noted  . Diabetes mellitus type 2, diet-controlled ([place]) 06/08/2016  . Seborrheic dermatitis 06/08/2016  . Morbid obesity ([place]) 06/08/2016  . Ruptured tympanic membrane, bilateral 01/27/2016  . Annual physical exam 01/25/2016  . Paroxysmal atrial fibrillation ([place]) 06/15/2015  . Polycythemia, secondary 08/26/2014  . Hemochromatosis 08/26/2014  . Multiple pulmonary nodules 08/16/2014  . Testicular cancer ([place]) 08/12/2014  . Hyperlipidemia 08/10/2014  . Hypogonadism in male 08/10/2014  . Essential hypertension 08/10/2014  . Spinal stenosis of lumbar region 08/10/2014  . Recurrent pulmonary emboli ([place]) 08/10/2014  . CAD (coronary artery disease), native coronary artery 08/10/2014    Vanesha Athens Nilda Simmer PT, MPH  10/25/2016, 10:18 AM  [facility] [place], [place], [address] Phone: [phone]   Fax:  [phone]  Name: Shamond Skelton MRN: [id number] Date of Birth: 12-22-1955

## 2016-10-30 ENCOUNTER — Ambulatory Visit (INDEPENDENT_AMBULATORY_CARE_PROVIDER_SITE_OTHER): Payer: Medicare Other | Admitting: Rehabilitative and Restorative Service Providers"

## 2016-10-30 ENCOUNTER — Encounter: Payer: Self-pay | Admitting: Rehabilitative and Restorative Service Providers"

## 2016-10-30 DIAGNOSIS — G8929 Other chronic pain: Secondary | ICD-10-CM | POA: Diagnosis not present

## 2016-10-30 DIAGNOSIS — M25612 Stiffness of left shoulder, not elsewhere classified: Secondary | ICD-10-CM | POA: Diagnosis not present

## 2016-10-30 DIAGNOSIS — M25512 Pain in left shoulder: Secondary | ICD-10-CM

## 2016-10-30 DIAGNOSIS — M545 Low back pain, unspecified: Secondary | ICD-10-CM

## 2016-10-30 DIAGNOSIS — M6281 Muscle weakness (generalized): Secondary | ICD-10-CM | POA: Diagnosis not present

## 2016-10-30 DIAGNOSIS — R2689 Other abnormalities of gait and mobility: Secondary | ICD-10-CM

## 2016-10-30 NOTE — Therapy (Signed)
[place], [place], [address] Phone: [phone]   Fax:  [phone]  Physical Therapy Treatment  Patient Details  Name: Jeff Green MRN: [id number] Date of Birth: [birth year] Referring Provider: Dr Dianah Field  Encounter Date: 10/30/2016    Past Medical History:  Diagnosis Date  . Atrial fibrillation ([place])   . CAD (coronary artery disease)    Stent placed; [place]  . Congestive heart failure (CHF) ([place])   . DVT (deep vein thrombosis) in pregnancy ([place])   . High cholesterol   . Hypertension   . Pulmonary embolus [facility])     Past Surgical History:  Procedure Laterality Date  . HERNIA REPAIR    . IVC  2014  . lamenectomy    . left orchiectomy      There were no vitals filed for this visit.      Subjective Assessment - 10/30/16 0851    Subjective Lt shoulder pain is worse today. He did not sleep well last night. Some days are worse than others. Does not know of anything he did that may have irritated symptoms. Working on exercises at home. DN does help.    Currently in Pain? Yes   Pain Score 0-No pain   Pain Location Coccyx   Pain Orientation Lower   Pain Type Chronic pain   Pain Onset More than a month ago   Pain Frequency Intermittent   Pain Score 4   Pain Location Shoulder   Pain Orientation Left;Anterior   Pain Descriptors / Indicators Aching;Tightness   Pain Type Chronic pain   Pain Onset More than a month ago   Pain Frequency Constant            [facility] PT Assessment - 10/30/16 0001      Assessment   Medical Diagnosis LBP , Lt shoulder pain   Referring Provider Dr Dianah Field   Onset Date/Surgical Date 02/08/16  07/2016 for shoulder   Hand Dominance Right     AROM   Left Shoulder Extension 41 Degrees   Left Shoulder Flexion 113 Degrees   Left Shoulder ABduction 109 Degrees   Left Shoulder External Rotation 33 Degrees  standing UE in neutral at  side      Palpation   Palpation comment tight and tender  Lt pecs, biceps; anterior deltoid and anterior shoulder                      [facility] Adult PT Treatment/Exercise - 10/30/16 0001      Shoulder Exercises: Standing   Extension Strengthening;Right;Left;10 reps;Theraband   Theraband Level (Shoulder Extension) Level 3 (Green)   Row Strengthening;Right;Left;10 reps   Theraband Level (Shoulder Row) Level 3 (Green)   Retraction Strengthening;Right;Left;10 reps;Theraband   Theraband Level (Shoulder Retraction) Level 2 (Red)   Other Standing Exercises scap squeeze with noodle 10 sec x 10    Other Standing Exercises W's noodle x 10      Shoulder Exercises: Therapy Ball   Flexion Limitations stepping under large ball for stretch into flexion 20-30 sec hold x 3      Shoulder Exercises: ROM/Strengthening   UBE (Upper Arm Bike) L1: 3 min, alt forward/backward      Shoulder Exercises: Stretch   Other Shoulder Stretches biceps stretch 30 sec x 3    Other Shoulder Stretches doorway mid position to pt tolerance - no increase in pain      Modalities   Modalities Electrical Stimulation;Vasopneumatic  Moist Heat Therapy   Number Minutes Moist Heat 20 Minutes   Moist Heat Location Shoulder  Lt     Electrical Stimulation   Electrical Stimulation Location Lt shoulder   Electrical Stimulation Action IFC   Electrical Stimulation Parameters to tolerance   Electrical Stimulation Goals Pain;Tone     Manual Therapy   Manual Therapy Soft tissue mobilization   Manual therapy comments pt supine   Soft tissue mobilization Lt shoulder pecs, biceps; deltoid    Myofascial Release to Lt pec major          Trigger Point Dry Needling - 10/30/16 0931    Consent Given? Yes   Muscles Treated Upper Body --  Lt - estim - biceps and ant deltoid - dec tightness noted    Pectoralis Major Response Palpable increased muscle length   Pectoralis Minor Response Palpable increased muscle  length                   PT Long Term Goals - 10/30/16 0854      PT LONG TERM GOAL #1   Title Improve standing posture, alignment and balance with patient to demonstrate good upright posture with improve neuromotor control 11/07/16   Time 4   Period Weeks   Status On-going     PT LONG TERM GOAL #2   Title Improve standing and walking tolerance to 20-30 min with decreased pain to 2/10 to 3/10  by 11/07/16   Time 4   Period Weeks   Status On-going     PT LONG TERM GOAL #3   Title Increase strength bilat LE's to 5/-/5 to 5/5 throughout 11/07/16   Time 4   Period Weeks   Status On-going     PT LONG TERM GOAL #4   Title Independent in HEP 11/07/16   Time 4   Period Weeks   Status On-going     PT LONG TERM GOAL #5   Title Improve FOTO to </= 52% limitation 11/07/16   Time 4   Period Weeks   Status On-going     PT LONG TERM GOAL #6   Title report =/> 75% reduction of Lt shoulder pain to allow restoration of ROM (11/07/16)    Time 4   Period Weeks   Status On-going     PT LONG TERM GOAL #7   Title demo =/> 5+/5 strength of Lt shoulder without pain ( 11/07/16)    Time 4   Period Weeks   Status On-going               Plan - 10/30/16 2297    Clinical Impression Statement Increased pain in the Lt anterior shoulder. DN decreases pain and improves mobility. ROM is improving. Continued tenderness and tightness to palpation and pain persists. Gradual progress toward stated goals of therapy.    Rehab Potential Good   PT Frequency 2x / week   PT Duration 4 weeks   PT Treatment/Interventions Patient/family education;ADLs/Self Care Home Management;Cryotherapy;Electrical Stimulation;Iontophoresis 4mg /ml Dexamethasone;Moist Heat;Ultrasound;Dry needling;Manual techniques;Therapeutic activities;Therapeutic exercise;Balance training;Neuromuscular re-education;Gait training   PT Next Visit Plan DN of left shoulder and manual work as indicated to left shoulder and sacrum.  Progress with lt shoulder stretching and strengthening.   Consulted and Agree with Plan of Care Patient      Patient will benefit from skilled therapeutic intervention in order to improve the following deficits and impairments:  Postural dysfunction, Improper body mechanics, Pain, Decreased strength, Decreased mobility, Decreased range of motion, Abnormal  gait, Decreased balance, Decreased activity tolerance, Impaired UE functional use, Increased muscle spasms  Visit Diagnosis: Muscle weakness (generalized)  Chronic bilateral low back pain without sciatica  Chronic left shoulder pain  Stiffness of left shoulder, not elsewhere classified  Other abnormalities of gait and mobility     Problem List Patient Active Problem List   Diagnosis Date Noted  . Diabetes mellitus type 2, diet-controlled ([place]) 06/08/2016  . Seborrheic dermatitis 06/08/2016  . Morbid obesity ([place]) 06/08/2016  . Ruptured tympanic membrane, bilateral 01/27/2016  . Annual physical exam 01/25/2016  . Paroxysmal atrial fibrillation ([place]) 06/15/2015  . Polycythemia, secondary 08/26/2014  . Hemochromatosis 08/26/2014  . Multiple pulmonary nodules 08/16/2014  . Testicular cancer ([place]) 08/12/2014  . Hyperlipidemia 08/10/2014  . Hypogonadism in male 08/10/2014  . Essential hypertension 08/10/2014  . Spinal stenosis of lumbar region 08/10/2014  . Recurrent pulmonary emboli ([place]) 08/10/2014  . CAD (coronary artery disease), native coronary artery 08/10/2014    Capers Hagmann Nilda Simmer PT, MPH  10/30/2016, 9:34 AM  [facility] [place], [place], [address] Phone: [phone]   Fax:  [phone]  Name: Bran Aldridge MRN: [id number] Date of Birth: [birth year]

## 2016-11-01 ENCOUNTER — Ambulatory Visit (INDEPENDENT_AMBULATORY_CARE_PROVIDER_SITE_OTHER): Payer: Medicare Other | Admitting: Physical Therapy

## 2016-11-01 DIAGNOSIS — M545 Low back pain, unspecified: Secondary | ICD-10-CM

## 2016-11-01 DIAGNOSIS — M25612 Stiffness of left shoulder, not elsewhere classified: Secondary | ICD-10-CM

## 2016-11-01 DIAGNOSIS — M25512 Pain in left shoulder: Secondary | ICD-10-CM

## 2016-11-01 DIAGNOSIS — G8929 Other chronic pain: Secondary | ICD-10-CM

## 2016-11-01 DIAGNOSIS — M6281 Muscle weakness (generalized): Secondary | ICD-10-CM

## 2016-11-01 NOTE — Therapy (Signed)
[place], [place], [address] Phone: [phone]   Fax:  [phone]  Physical Therapy Treatment  Patient Details  Name: Jeff Green MRN: [id number] Date of Birth: [birth year] Referring Provider: Dr Dianah Field  Encounter Date: 11/01/2016      PT End of Session - 11/01/16 0935    Visit Number 13   Number of Visits 24   Date for PT Re-Evaluation 12/13/16   PT Start Time 0932   PT Stop Time 1039   PT Time Calculation (min) 67 min   Activity Tolerance Patient tolerated treatment well   Behavior During Therapy [facility] for tasks assessed/performed      Past Medical History:  Diagnosis Date  . Atrial fibrillation ([place])   . CAD (coronary artery disease)    Stent placed; [place]  . Congestive heart failure (CHF) ([place])   . DVT (deep vein thrombosis) in pregnancy ([place])   . High cholesterol   . Hypertension   . Pulmonary embolus [facility])     Past Surgical History:  Procedure Laterality Date  . HERNIA REPAIR    . IVC  2014  . lamenectomy    . left orchiectomy      There were no vitals filed for this visit.      Subjective Assessment - 11/01/16 1007    Subjective Pt states shoulder really hurts today, but he feels that the DN helped his shoulder feel better at last treatment.    Pertinent History Back injury 1988; bilat knee pain; cardiac stent ~5 yrs ago    How long can you sit comfortably? 5 min   How long can you stand comfortably? 5 min    How long can you walk comfortably? 5-10 min    Diagnostic tests MRI Xray    Patient Stated Goals to strengthen back and come out feeling a whole lot better; to be able to walk comfortably, have less pain in Lt shoulder and be able to lift it again without pain   Currently in Pain? Yes   Pain Score 5    Pain Location Shoulder   Pain Orientation Left;Upper;Lower   Pain Onset More than a month ago   Multiple Pain Sites No             [facility] PT Assessment - 11/01/16 0001      Assessment   Medical Diagnosis LBP , Lt shoulder pain   Referring Provider Dr Dianah Field   Onset Date/Surgical Date 02/08/16  07/2016 for shoulder   Hand Dominance Right           [facility] Adult PT Treatment/Exercise - 11/01/16 0001      Shoulder Exercises: Supine   External Rotation Strengthening;Both;10 reps;Theraband   Theraband Level (Shoulder External Rotation) Level 1 (Yellow)     Shoulder Exercises: Seated   Flexion Right;Left;10 reps;Other (comment)  With pulley     Shoulder Exercises: Standing   External Rotation Both;10 reps;Theraband  Against wall with noodle behind back   Theraband Level (Shoulder External Rotation) Level 2 (Red)   Extension Strengthening;Right;Left;10 reps;Theraband   Theraband Level (Shoulder Extension) Level 3 (Green);Level 2 (Red)  switched to red, reported reduced pain.   Row Strengthening;Right;Left;10 reps   Theraband Level (Shoulder Row) Level 3 (Green)     Shoulder Exercises: Pulleys   Flexion 3 minutes  10 sec holds.      Shoulder Exercises: ROM/Strengthening   UBE (Upper Arm Bike) L1: 4 min, alt forward/backward  Shoulder Exercises: Stretch   Other Shoulder Stretches doorway mid position to pt tolerance - no increase in pain   VC for relaxing elbows.      Modalities   Modalities Electrical Stimulation;Vasopneumatic     Moist Heat Therapy   Number Minutes Moist Heat 20 Minutes   Moist Heat Location Shoulder  Lt     Electrical Stimulation   Electrical Stimulation Location Lt shoulder   Electrical Stimulation Action IFC   Electrical Stimulation Parameters to tolerance   Electrical Stimulation Goals Pain;Tone     Manual Therapy   Manual Therapy Soft tissue mobilization   Manual therapy comments pt supine   Soft tissue mobilization Lt shoulder pecs, biceps; deltoid; teres/lats   Myofascial Release to Lt pec major          Trigger Point Dry Needling - 11/01/16 1006     Consent Given? Yes   Muscles Treated Upper Body --  Lt with estim added teres/lats - palpable decrease tightness   Pectoralis Major Response Palpable increased muscle length   Pectoralis Minor Response Palpable increased muscle length                   PT Long Term Goals - 11/01/16 1048      PT LONG TERM GOAL #1   Title Improve standing posture, alignment and balance with patient to demonstrate good upright posture with improve neuromotor control 12/13/16   Time 10   Period Weeks   Status Revised     PT LONG TERM GOAL #2   Title Improve standing and walking tolerance to 20-30 min with decreased pain to 2/10 to 3/10  by 12/13/16   Time 10   Period Weeks   Status Revised     PT LONG TERM GOAL #3   Title Increase strength bilat LE's to 5/-/5 to 5/5 throughout 12/13/16   Time 10   Period Weeks   Status Revised     PT LONG TERM GOAL #4   Title Independent in HEP 12/13/16   Time 10   Period Weeks   Status Revised     PT LONG TERM GOAL #5   Title Improve FOTO to </= 52% limitation 12/14/16   Time 10   Period Weeks   Status Revised     PT LONG TERM GOAL #6   Title report =/> 75% reduction of Lt shoulder pain to allow restoration of ROM (12/13/16)    Time 10   Period Weeks   Status Revised     PT LONG TERM GOAL #7   Title demo =/> 5+/5 strength of Lt shoulder without pain ( 78/24/18)    Time 10   Period Weeks   Status Revised               Plan - 11/01/16 1042    Clinical Impression Statement Pt continues with persistent Lt shoulder pain and tightness.  He had some difficulty tolerating therapeutic exercise due to increased pain in Lt shoulder. Pt reported post-DN soreness at end of session.  Focus has been on Lt shoulder since low back pain has disappated.  Pt will benefit from continued PT intervention to maximize functional mobility and decrease pain.     Rehab Potential Good   PT Frequency 2x / week   PT Duration 4 weeks   PT Treatment/Interventions  Patient/family education;ADLs/Self Care Home Management;Cryotherapy;Electrical Stimulation;Iontophoresis 4mg /ml Dexamethasone;Moist Heat;Ultrasound;Dry needling;Manual techniques;Therapeutic activities;Therapeutic exercise;Balance training;Neuromuscular re-education;Gait training   PT Next Visit Plan Spoke to supervising  PT regarding pt's progress and desire to cont therapy; will req additional visits from MD.    Consulted and Agree with Plan of Care Patient      Patient will benefit from skilled therapeutic intervention in order to improve the following deficits and impairments:  Postural dysfunction, Improper body mechanics, Pain, Decreased strength, Decreased mobility, Decreased range of motion, Abnormal gait, Decreased balance, Decreased activity tolerance, Impaired UE functional use, Increased muscle spasms  Visit Diagnosis: Muscle weakness (generalized) - Plan: PT plan of care cert/re-cert  Chronic left shoulder pain - Plan: PT plan of care cert/re-cert  Stiffness of left shoulder, not elsewhere classified - Plan: PT plan of care cert/re-cert  Chronic bilateral low back pain without sciatica - Plan: PT plan of care cert/re-cert     Problem List Patient Active Problem List   Diagnosis Date Noted  . Diabetes mellitus type 2, diet-controlled ([place]) 06/08/2016  . Seborrheic dermatitis 06/08/2016  . Morbid obesity ([place]) 06/08/2016  . Ruptured tympanic membrane, bilateral 01/27/2016  . Annual physical exam 01/25/2016  . Paroxysmal atrial fibrillation ([place]) 06/15/2015  . Polycythemia, secondary 08/26/2014  . Hemochromatosis 08/26/2014  . Multiple pulmonary nodules 08/16/2014  . Testicular cancer ([place]) 08/12/2014  . Hyperlipidemia 08/10/2014  . Hypogonadism in male 08/10/2014  . Essential hypertension 08/10/2014  . Spinal stenosis of lumbar region 08/10/2014  . Recurrent pulmonary emboli ([place]) 08/10/2014  . CAD (coronary artery disease), native coronary artery 08/10/2014    Kerin Perna, PTA 11/01/16 10:54 AM  Celyn P. Helene Kelp PT, MPH 11/01/16 10:54 AM   [place], [place], [address] Phone: [phone]   Fax:  [phone]  Name: Jeff Green MRN: [id number] Date of Birth: Sep 04, 1955

## 2016-11-06 ENCOUNTER — Encounter: Payer: Medicare Other | Admitting: Rehabilitative and Restorative Service Providers"

## 2016-11-08 ENCOUNTER — Ambulatory Visit (INDEPENDENT_AMBULATORY_CARE_PROVIDER_SITE_OTHER): Payer: Medicare Other | Admitting: Physical Therapy

## 2016-11-08 DIAGNOSIS — R2689 Other abnormalities of gait and mobility: Secondary | ICD-10-CM | POA: Diagnosis not present

## 2016-11-08 DIAGNOSIS — M6281 Muscle weakness (generalized): Secondary | ICD-10-CM | POA: Diagnosis not present

## 2016-11-08 DIAGNOSIS — M25512 Pain in left shoulder: Secondary | ICD-10-CM

## 2016-11-08 DIAGNOSIS — M25612 Stiffness of left shoulder, not elsewhere classified: Secondary | ICD-10-CM | POA: Diagnosis not present

## 2016-11-08 DIAGNOSIS — M545 Low back pain, unspecified: Secondary | ICD-10-CM

## 2016-11-08 DIAGNOSIS — G8929 Other chronic pain: Secondary | ICD-10-CM

## 2016-11-08 NOTE — Therapy (Signed)
[place], [place], [address] Phone: [phone]   Fax:  [phone]  Physical Therapy Treatment  Patient Details  Name: Jeff Green MRN: [id number] Date of Birth: 1955/11/04 Referring Provider: Dr Dianah Field  Encounter Date: 11/08/2016      PT End of Session - 11/08/16 1025    Visit Number 14   Number of Visits 24   Date for PT Re-Evaluation 12/13/16   PT Start Time 1020   PT Stop Time 1100   PT Time Calculation (min) 40 min   Activity Tolerance Patient tolerated treatment well  Pt not feeling well.   Behavior During Therapy [facility] for tasks assessed/performed      Past Medical History:  Diagnosis Date  . Atrial fibrillation ([place])   . CAD (coronary artery disease)    Stent placed; [place]  . Congestive heart failure (CHF) ([place])   . DVT (deep vein thrombosis) in pregnancy ([place])   . High cholesterol   . Hypertension   . Pulmonary embolus [facility])     Past Surgical History:  Procedure Laterality Date  . HERNIA REPAIR    . IVC  2014  . lamenectomy    . left orchiectomy      There were no vitals filed for this visit.      Subjective Assessment - 11/08/16 1027    Subjective Pt does not feel well today and is achy all over. Patient agreeable to treatment, but not too much exercise due to nausea. He has been using TENS unit on his back, shoulder and knees for pain relief.    Pertinent History Back injury 1988; bilat knee pain; cardiac stent ~5 yrs ago    How long can you sit comfortably? 5 min   How long can you stand comfortably? 5 min    How long can you walk comfortably? 5-10 min    Diagnostic tests MRI Xray    Patient Stated Goals to strengthen back and come out feeling a whole lot better; to be able to walk comfortably, have less pain in Lt shoulder and be able to lift it again without pain   Currently in Pain? No/denies  Lt shoulder up to 5/10 with flexion   Pain Onset More than a month ago            [facility] PT Assessment - 11/08/16 0001      Assessment   Medical Diagnosis LBP , Lt shoulder pain   Referring Provider Dr Dianah Field   Onset Date/Surgical Date 02/08/16  07/2016 for shoulder   Hand Dominance Right          [facility] Adult PT Treatment/Exercise - 11/08/16 0001      Shoulder Exercises: Seated   Other Seated Exercises shoulder rolls x 10; scap retraction x 5 sec hold x 10 reps     Shoulder Exercises: Stretch   Corner Stretch 3 reps;30 seconds  low and mid positions to pt tolerance    Other Shoulder Stretches biceps stretch 30 sec x 3      Modalities   Modalities Ultrasound     Ultrasound   Ultrasound Location Lt ant shoulder    Ultrasound Parameters 100%, 1.0 mHz, 1.3 w/cm2 x 8 min    Ultrasound Goals Pain  tightness.      Manual Therapy   Manual Therapy Soft tissue mobilization;Passive ROM;Myofascial release;Scapular mobilization   Manual therapy comments pt supine   Joint Mobilization Lt scapula in all directions.  Soft tissue mobilization Lt shoulder pecs, biceps; deltoid; teres/lats   Myofascial Release to Lt pec major   Passive ROM Lt shoulder ext with elbow ext; Lt ER,IR with slight scaption, Scaption          PT Long Term Goals - 11/01/16 1048      PT LONG TERM GOAL #1   Title Improve standing posture, alignment and balance with patient to demonstrate good upright posture with improve neuromotor control 12/13/16   Time 10   Period Weeks   Status Revised     PT LONG TERM GOAL #2   Title Improve standing and walking tolerance to 20-30 min with decreased pain to 2/10 to 3/10  by 12/13/16   Time 10   Period Weeks   Status Revised     PT LONG TERM GOAL #3   Title Increase strength bilat LE's to 5/-/5 to 5/5 throughout 12/13/16   Time 10   Period Weeks   Status Revised     PT LONG TERM GOAL #4   Title Independent in HEP 12/13/16   Time 10   Period Weeks   Status Revised     PT LONG TERM GOAL  #5   Title Improve FOTO to </= 52% limitation 12/14/16   Time 10   Period Weeks   Status Revised     PT LONG TERM GOAL #6   Title report =/> 75% reduction of Lt shoulder pain to allow restoration of ROM (12/13/16)    Time 10   Period Weeks   Status Revised     PT LONG TERM GOAL #7   Title demo =/> 5+/5 strength of Lt shoulder without pain ( 78/24/18)    Time 10   Period Weeks   Status Revised               Plan - 11/08/16 1049    Clinical Impression Statement Pt had positive response to last session including DN.  Today's session limited to manual therapy, light stretches and [place] due to pt not feeling well today.  No new goals met this visit.    Rehab Potential Good   PT Frequency 2x / week   PT Duration 4 weeks   PT Treatment/Interventions Patient/family education;ADLs/Self Care Home Management;Cryotherapy;Electrical Stimulation;Iontophoresis 13m/ml Dexamethasone;Moist Heat;Ultrasound;Dry needling;Manual techniques;Therapeutic activities;Therapeutic exercise;Balance training;Neuromuscular re-education;Gait training   PT Next Visit Plan Continue progressive Lt shoulder ROM/strengthening/ manual therapy.  Address LTG 2 and 3 (LE/Low back).    Consulted and Agree with Plan of Care Patient      Patient will benefit from skilled therapeutic intervention in order to improve the following deficits and impairments:  Postural dysfunction, Improper body mechanics, Pain, Decreased strength, Decreased mobility, Decreased range of motion, Abnormal gait, Decreased balance, Decreased activity tolerance, Impaired UE functional use, Increased muscle spasms  Visit Diagnosis: Muscle weakness (generalized)  Chronic left shoulder pain  Stiffness of left shoulder, not elsewhere classified  Chronic bilateral low back pain without sciatica  Other abnormalities of gait and mobility     Problem List Patient Active Problem List   Diagnosis Date Noted  . Diabetes mellitus type 2,  diet-controlled ([place] 06/08/2016  . Seborrheic dermatitis 06/08/2016  . Morbid obesity ([place] 06/08/2016  . Ruptured tympanic membrane, bilateral 01/27/2016  . Annual physical exam 01/25/2016  . Paroxysmal atrial fibrillation ([place] 06/15/2015  . Polycythemia, secondary 08/26/2014  . Hemochromatosis 08/26/2014  . Multiple pulmonary nodules 08/16/2014  . Testicular cancer ([place] 08/12/2014  . Hyperlipidemia 08/10/2014  . Hypogonadism  in male 08/10/2014  . Essential hypertension 08/10/2014  . Spinal stenosis of lumbar region 08/10/2014  . Recurrent pulmonary emboli ([place]) 08/10/2014  . CAD (coronary artery disease), native coronary artery 08/10/2014   Kerin Perna, PTA 11/08/16 12:28 PM  [place], [place], [address] Phone: [phone]   Fax:  [phone]  Name: Camran Keady MRN: [id number] Date of Birth: 1955/11/23

## 2016-11-09 DIAGNOSIS — E119 Type 2 diabetes mellitus without complications: Secondary | ICD-10-CM | POA: Diagnosis not present

## 2016-11-09 DIAGNOSIS — C6290 Malignant neoplasm of unspecified testis, unspecified whether descended or undescended: Secondary | ICD-10-CM | POA: Diagnosis not present

## 2016-11-10 LAB — CBC
HCT: 45.5 % (ref 38.5–50.0)
Hemoglobin: 15.5 g/dL (ref 13.2–17.1)
MCH: 31.6 pg (ref 27.0–33.0)
MCHC: 34.1 g/dL (ref 32.0–36.0)
MCV: 92.7 fL (ref 80.0–100.0)
MPV: 13.7 fL — ABNORMAL HIGH (ref 7.5–12.5)
Platelets: 154 K/uL (ref 140–400)
RBC: 4.91 MIL/uL (ref 4.20–5.80)
RDW: 14.2 % (ref 11.0–15.0)
WBC: 7 10*3/uL (ref 3.8–10.8)

## 2016-11-10 LAB — LIPID PANEL W/REFLEX DIRECT LDL
Cholesterol: 166 mg/dL (ref <200)
HDL: 49 mg/dL (ref >40)
LDL-Cholesterol: 86 mg/dL
Non-HDL Cholesterol (Calc): 117 mg/dL (ref <130)
Total CHOL/HDL Ratio: 3.4 Ratio (ref <5.0)
Triglycerides: 223 mg/dL — ABNORMAL HIGH (ref <150)

## 2016-11-10 LAB — COMPREHENSIVE METABOLIC PANEL
ALT: 36 U/L (ref 9–46)
AST: 32 U/L (ref 10–35)
Alkaline Phosphatase: 60 U/L (ref 40–115)
BUN: 22 mg/dL (ref 7–25)
CO2: 25 mmol/L (ref 20–31)
Calcium: 9.8 mg/dL (ref 8.6–10.3)
Creat: 1.04 mg/dL (ref 0.70–1.33)
Glucose, Bld: 159 mg/dL — ABNORMAL HIGH (ref 65–99)
Sodium: 135 mmol/L (ref 135–146)
Total Bilirubin: 0.6 mg/dL (ref 0.2–1.2)

## 2016-11-10 LAB — COMPREHENSIVE METABOLIC PANEL WITH GFR
Albumin: 4.3 g/dL (ref 3.6–5.1)
Chloride: 100 mmol/L (ref 98–110)
Potassium: 4.4 mmol/L (ref 3.5–5.3)
Total Protein: 6.9 g/dL (ref 6.1–8.1)

## 2016-11-10 LAB — HEMOGLOBIN A1C
Hgb A1c MFr Bld: 7.2 % — ABNORMAL HIGH (ref <5.7)
Mean Plasma Glucose: 160 mg/dL

## 2016-11-10 LAB — TSH: TSH: 1.53 m[IU]/L (ref 0.40–4.50)

## 2016-11-12 LAB — TESTOSTERONE TOTAL,FREE,BIO, MALES
Albumin: 4.3 g/dL (ref 3.6–5.1)
Sex Hormone Binding: 23 nmol/L (ref 22–77)
Testosterone: 29 ng/dL — ABNORMAL LOW (ref 250–827)

## 2016-11-13 ENCOUNTER — Encounter: Payer: Medicare Other | Admitting: Rehabilitative and Restorative Service Providers"

## 2016-11-15 ENCOUNTER — Ambulatory Visit (INDEPENDENT_AMBULATORY_CARE_PROVIDER_SITE_OTHER): Payer: Medicare Other | Admitting: Sports Medicine

## 2016-11-15 ENCOUNTER — Encounter: Payer: Medicare Other | Admitting: Physical Therapy

## 2016-11-15 DIAGNOSIS — I480 Paroxysmal atrial fibrillation: Secondary | ICD-10-CM | POA: Diagnosis not present

## 2016-11-15 DIAGNOSIS — I10 Essential (primary) hypertension: Secondary | ICD-10-CM

## 2016-11-15 DIAGNOSIS — I2699 Other pulmonary embolism without acute cor pulmonale: Secondary | ICD-10-CM

## 2016-11-15 LAB — POCT INR: INR: 2.4

## 2016-11-15 NOTE — Progress Notes (Signed)
Pt reports taking. ED

## 2016-11-15 NOTE — Assessment & Plan Note (Signed)
INR is therapeutic, recheck in one month.

## 2016-11-15 NOTE — Assessment & Plan Note (Signed)
Well controlled. No changes. 

## 2016-11-16 NOTE — Progress Notes (Signed)
Pt.notified

## 2016-11-19 ENCOUNTER — Telehealth: Payer: Self-pay

## 2016-11-19 NOTE — Telephone Encounter (Signed)
I called [employer] to start a PA for Belviq. Belviq is a plan exclusion. Medicare supplements will not pay for weight loss medications. He is unable to use coupon card due to having Medicare.

## 2016-11-19 NOTE — Telephone Encounter (Signed)
Ultimately I think he needs to work with me and consider bariatric surgery I can see him back for follow-up to discuss.,

## 2016-11-20 ENCOUNTER — Telehealth: Payer: Self-pay | Admitting: Sports Medicine

## 2016-11-20 MED ORDER — IRBESARTAN-HYDROCHLOROTHIAZIDE 300-12.5 MG PO TABS: 1.0000 | ORAL_TABLET | Freq: Every day | ORAL | 3 refills | Status: DC

## 2016-11-20 NOTE — Telephone Encounter (Signed)
Left VM with information.  

## 2016-11-20 NOTE — Telephone Encounter (Signed)
Recent valsartan recall, for the time being we will switch to Avapro HCTZ

## 2016-11-21 NOTE — Telephone Encounter (Signed)
Patient advised and scheduled.  

## 2016-11-22 ENCOUNTER — Ambulatory Visit (INDEPENDENT_AMBULATORY_CARE_PROVIDER_SITE_OTHER): Payer: Medicare Other | Admitting: Physical Therapy

## 2016-11-22 DIAGNOSIS — M25512 Pain in left shoulder: Secondary | ICD-10-CM | POA: Diagnosis not present

## 2016-11-22 DIAGNOSIS — R2689 Other abnormalities of gait and mobility: Secondary | ICD-10-CM | POA: Diagnosis not present

## 2016-11-22 DIAGNOSIS — M6281 Muscle weakness (generalized): Secondary | ICD-10-CM

## 2016-11-22 DIAGNOSIS — M25612 Stiffness of left shoulder, not elsewhere classified: Secondary | ICD-10-CM | POA: Diagnosis not present

## 2016-11-22 DIAGNOSIS — M545 Low back pain, unspecified: Secondary | ICD-10-CM

## 2016-11-22 DIAGNOSIS — G8929 Other chronic pain: Secondary | ICD-10-CM

## 2016-11-22 NOTE — Therapy (Signed)
[place], [place], [address] Phone: [phone]   Fax:  [phone]  Physical Therapy Treatment  Patient Details  Name: Jeff Green MRN: [id number] Date of Birth: 07-Jul-1955 Referring Provider: Dr Dianah Field  Encounter Date: 11/22/2016      PT End of Session - 11/22/16 1701    Visit Number 15   Number of Visits 24   Date for PT Re-Evaluation 12/13/16   PT Start Time 1701   PT Stop Time 1758   PT Time Calculation (min) 57 min   Activity Tolerance Patient tolerated treatment well      Past Medical History:  Diagnosis Date  . Atrial fibrillation ([place])   . CAD (coronary artery disease)    Stent placed; [place]  . Congestive heart failure (CHF) ([place])   . DVT (deep vein thrombosis) in pregnancy ([place])   . High cholesterol   . Hypertension   . Pulmonary embolus [facility])     Past Surgical History:  Procedure Laterality Date  . HERNIA REPAIR    . IVC  2014  . lamenectomy    . left orchiectomy      There were no vitals filed for this visit.      Subjective Assessment - 11/22/16 1702    Subjective Pt was very concerned about coming back to therapy as he has had increased pain since his last visit.    Patient Stated Goals to strengthen back and come out feeling a whole lot better; to be able to walk comfortably, have less pain in Lt shoulder and be able to lift it again without pain   Currently in Pain? Yes   Pain Score 7    Pain Location Shoulder   Pain Orientation Right;Posterior   Pain Descriptors / Indicators Shooting;Stabbing   Pain Type Chronic pain   Pain Onset More than a month ago   Pain Frequency Intermittent   Aggravating Factors  all movement, sleeping on it, trying to lift   Pain Relieving Factors TENs, warm towel, gentle stretching            [facility] PT Assessment - 11/22/16 0001      Assessment   Medical Diagnosis LBP , Lt shoulder pain     AROM    Left Shoulder Internal Rotation 50 Degrees  in supine   Left Shoulder External Rotation 28 Degrees  PROM 33, supine                     [facility] Adult PT Treatment/Exercise - 11/22/16 0001      Shoulder Exercises: Supine   Other Supine Exercises AAROM shoulder press and overhead flexion stretch with dowel     Shoulder Exercises: Stretch   Other Shoulder Stretches shoulder stretches using strap in door way.      Modalities   Modalities Moist Heat;Electrical Stimulation     Moist Heat Therapy   Number Minutes Moist Heat 15 Minutes   Moist Heat Location Shoulder  Lt     Electrical Stimulation   Electrical Stimulation Location Lt shouldewr   Electrical Stimulation Action IFC   Electrical Stimulation Parameters to toleracne   Electrical Stimulation Goals Pain;Tone     Manual Therapy   Manual Therapy Soft tissue mobilization;Passive ROM;Myofascial release;Scapular mobilization   Joint Mobilization Lt shoulder AP mobs grade III   Soft tissue mobilization Lt UE manual distraction in varies degrees of abduction up to 60 degrees.    Myofascial  Release Lt triceps, deltoid and posterior shoulder musculature          Trigger Point Dry Needling - 11/22/16 1721    Consent Given? Yes   Education Handout Provided No   Muscles Treated Upper Body Infraspinatus;Subscapularis  deltoid all Lt side   Infraspinatus Response Palpable increased muscle length;Twitch response elicited   Subscapularis Response Palpable increased muscle length;Twitch response elicited              PT Education - 11/22/16 1753    Education provided Yes   Education Details instructed to avoid overhead stretches for now unless it is AAROM in supine.   Person(s) Educated Patient   Methods Explanation;Demonstration   Comprehension Verbalized understanding;Returned demonstration             PT Long Term Goals - 11/01/16 1048      PT LONG TERM GOAL #1   Title Improve standing posture,  alignment and balance with patient to demonstrate good upright posture with improve neuromotor control 12/13/16   Time 10   Period Weeks   Status Revised     PT LONG TERM GOAL #2   Title Improve standing and walking tolerance to 20-30 min with decreased pain to 2/10 to 3/10  by 12/13/16   Time 10   Period Weeks   Status Revised     PT LONG TERM GOAL #3   Title Increase strength bilat LE's to 5/-/5 to 5/5 throughout 12/13/16   Time 10   Period Weeks   Status Revised     PT LONG TERM GOAL #4   Title Independent in HEP 12/13/16   Time 10   Period Weeks   Status Revised     PT LONG TERM GOAL #5   Title Improve FOTO to </= 52% limitation 12/14/16   Time 10   Period Weeks   Status Revised     PT LONG TERM GOAL #6   Title report =/> 75% reduction of Lt shoulder pain to allow restoration of ROM (12/13/16)    Time 10   Period Weeks   Status Revised     PT LONG TERM GOAL #7   Title demo =/> 5+/5 strength of Lt shoulder without pain ( 78/24/18)    Time 10   Period Weeks   Status Revised               Plan - 11/22/16 1754    Clinical Impression Statement [name] had a lot of tightness around the Lt shoulder complex with pain.  He also was using compensatory movements to elevate the Lt arm.  The tightness and pain decreased with treatment and he had improved movement and quality of movement of the Lt shoulder after treatment. He is still lacking ROM and has poor mechanics.    Rehab Potential Good   PT Frequency 2x / week   PT Duration 4 weeks   PT Treatment/Interventions Patient/family education;ADLs/Self Care Home Management;Cryotherapy;Electrical Stimulation;Iontophoresis 4mg /ml Dexamethasone;Moist Heat;Ultrasound;Dry needling;Manual techniques;Therapeutic activities;Therapeutic exercise;Balance training;Neuromuscular re-education;Gait training   PT Next Visit Plan assess response to this last visit, continue with manual work to the Lt shoulder complex and slowly add in scapular  stabilization ex.    Consulted and Agree with Plan of Care Patient      Patient will benefit from skilled therapeutic intervention in order to improve the following deficits and impairments:  Postural dysfunction, Improper body mechanics, Pain, Decreased strength, Decreased mobility, Decreased range of motion, Abnormal gait, Decreased balance, Decreased activity tolerance,  Impaired UE functional use, Increased muscle spasms  Visit Diagnosis: Muscle weakness (generalized)  Chronic left shoulder pain  Stiffness of left shoulder, not elsewhere classified  Chronic bilateral low back pain without sciatica  Other abnormalities of gait and mobility     Problem List Patient Active Problem List   Diagnosis Date Noted  . Diabetes mellitus type 2, diet-controlled ([place]) 06/08/2016  . Seborrheic dermatitis 06/08/2016  . Morbid obesity ([place]) 06/08/2016  . Ruptured tympanic membrane, bilateral 01/27/2016  . Annual physical exam 01/25/2016  . Paroxysmal atrial fibrillation ([place]) 06/15/2015  . Polycythemia, secondary 08/26/2014  . Hemochromatosis 08/26/2014  . Multiple pulmonary nodules 08/16/2014  . Testicular cancer ([place]) 08/12/2014  . Hyperlipidemia 08/10/2014  . Hypogonadism in male 08/10/2014  . Essential hypertension 08/10/2014  . Spinal stenosis of lumbar region 08/10/2014  . Recurrent pulmonary emboli ([place]) 08/10/2014  . CAD (coronary artery disease), native coronary artery 08/10/2014    [name] PT  11/22/2016, 6:04 PM  [facility] [place], [place], [address] Phone: [phone]   Fax:  [phone]  Name: Jeff Green MRN: [id number] Date of Birth: 1955-06-07

## 2016-11-25 DIAGNOSIS — S4992XA Unspecified injury of left shoulder and upper arm, initial encounter: Secondary | ICD-10-CM | POA: Diagnosis not present

## 2016-11-25 DIAGNOSIS — Z87891 Personal history of nicotine dependence: Secondary | ICD-10-CM | POA: Diagnosis not present

## 2016-11-25 DIAGNOSIS — Z86711 Personal history of pulmonary embolism: Secondary | ICD-10-CM | POA: Diagnosis not present

## 2016-11-25 DIAGNOSIS — J302 Other seasonal allergic rhinitis: Secondary | ICD-10-CM | POA: Diagnosis not present

## 2016-11-25 DIAGNOSIS — M19012 Primary osteoarthritis, left shoulder: Secondary | ICD-10-CM | POA: Diagnosis not present

## 2016-11-25 DIAGNOSIS — I10 Essential (primary) hypertension: Secondary | ICD-10-CM | POA: Diagnosis not present

## 2016-11-25 DIAGNOSIS — Z95828 Presence of other vascular implants and grafts: Secondary | ICD-10-CM | POA: Diagnosis not present

## 2016-11-25 DIAGNOSIS — Z888 Allergy status to other drugs, medicaments and biological substances status: Secondary | ICD-10-CM | POA: Diagnosis not present

## 2016-11-25 DIAGNOSIS — Z79899 Other long term (current) drug therapy: Secondary | ICD-10-CM | POA: Diagnosis not present

## 2016-11-25 DIAGNOSIS — X500XXA Overexertion from strenuous movement or load, initial encounter: Secondary | ICD-10-CM | POA: Diagnosis not present

## 2016-11-25 DIAGNOSIS — Z9889 Other specified postprocedural states: Secondary | ICD-10-CM | POA: Diagnosis not present

## 2016-11-25 DIAGNOSIS — Z7902 Long term (current) use of antithrombotics/antiplatelets: Secondary | ICD-10-CM | POA: Diagnosis not present

## 2016-11-25 DIAGNOSIS — Z955 Presence of coronary angioplasty implant and graft: Secondary | ICD-10-CM | POA: Diagnosis not present

## 2016-11-25 DIAGNOSIS — I4891 Unspecified atrial fibrillation: Secondary | ICD-10-CM | POA: Diagnosis not present

## 2016-11-25 DIAGNOSIS — M25512 Pain in left shoulder: Secondary | ICD-10-CM | POA: Diagnosis not present

## 2016-11-25 DIAGNOSIS — Z79890 Hormone replacement therapy (postmenopausal): Secondary | ICD-10-CM | POA: Diagnosis not present

## 2016-11-25 DIAGNOSIS — Z7901 Long term (current) use of anticoagulants: Secondary | ICD-10-CM | POA: Diagnosis not present

## 2016-11-26 ENCOUNTER — Encounter: Payer: Medicare Other | Admitting: Physical Therapy

## 2016-11-27 MED ORDER — TELMISARTAN-HCTZ 80-12.5 MG PO TABS: 1.0000 | ORAL_TABLET | Freq: Every day | ORAL | 3 refills | Status: DC

## 2016-11-27 NOTE — Telephone Encounter (Signed)
Just received notice from pharmacy after sending in irbesartan/HCTZ that this was also on recall, switching to Telmisartan/HCTZ.

## 2016-11-27 NOTE — Addendum Note (Signed)
Addended by: Silverio Decamp on: 11/27/2016 09:58 AM   Modules accepted: Orders

## 2016-11-28 ENCOUNTER — Ambulatory Visit (INDEPENDENT_AMBULATORY_CARE_PROVIDER_SITE_OTHER): Payer: Medicare Other | Admitting: Sports Medicine

## 2016-11-28 ENCOUNTER — Encounter: Payer: Medicare Other | Admitting: Physical Therapy

## 2016-11-28 DIAGNOSIS — M19012 Primary osteoarthritis, left shoulder: Secondary | ICD-10-CM | POA: Diagnosis not present

## 2016-11-28 DIAGNOSIS — M19011 Primary osteoarthritis, right shoulder: Secondary | ICD-10-CM | POA: Insufficient documentation

## 2016-11-28 MED ORDER — WARFARIN SODIUM 7.5 MG PO TABS: 7.5000 mg | ORAL_TABLET | Freq: Every day | ORAL | 1 refills | Status: DC

## 2016-11-28 NOTE — Assessment & Plan Note (Signed)
X-rays at [facility], exam is consistent. Glenohumeral injection as above.

## 2016-11-28 NOTE — Progress Notes (Signed)
  Subjective:    CC: Follow-up  HPI: Abnormal weight gain: Currently morbidly obese, unable to use phentermine due to A. fib, he is government insured so none of the branded medications are going to be available. At this point he is agreeable to discuss this with bariatric surgery.  Left shoulder pain: Went to the emergency department at [facility], x-rays showed glenohumeral and acromioclavicular osteoarthritis, pain is localized at the anterior and posterior joint lines and worse with abduction and external rotation. No trauma. Moderate, persistent without radiation.  Past medical history:  Negative.  See flowsheet/record as well for more information.  Surgical history: Negative.  See flowsheet/record as well for more information.  Family history: Negative.  See flowsheet/record as well for more information.  Social history: Negative.  See flowsheet/record as well for more information.  Allergies, and medications have been entered into the medical record, reviewed, and no changes needed.   Review of Systems: No fevers, chills, night sweats, weight loss, chest pain, or shortness of breath.   Objective:    General: Well Developed, well nourished, and in no acute distress.  Neuro: Alert and oriented x3, extra-ocular muscles intact, sensation grossly intact.  HEENT: Normocephalic, atraumatic, pupils equal round reactive to light, neck supple, no masses, no lymphadenopathy, thyroid nonpalpable.  Skin: Warm and dry, no rashes. Cardiac: Regular rate and rhythm, no murmurs rubs or gallops, no lower extremity edema.  Respiratory: Clear to auscultation bilaterally. Not using accessory muscles, speaking in full sentences. Left shoulder: Severe pain with abduction, external rotation, he does likely have a complete rotator cuff tear, but no impingement type symptoms on exam. Positive crank test.  Procedure: Real-time Ultrasound Guided Injection of left glenohumeral joint Device: GE Logiq E  Verbal  informed consent obtained.  Time-out conducted.  Noted no overlying erythema, induration, or other signs of local infection.  Skin prepped in a sterile fashion.  Local anesthesia: Topical Ethyl chloride.  With sterile technique and under real time ultrasound guidance:  Noted significant osteoarthritis on ultrasound, I guided a 22-gauge spinal needle into the glenohumeral joint and injected 1 mL kenalog 40, 2 mL lidocaine, 2 mL bupivacaine easily. Completed without difficulty  Pain immediately resolved suggesting accurate placement of the medication.  Advised to call if fevers/chills, erythema, induration, drainage, or persistent bleeding.  Images permanently stored and available for review in the ultrasound unit.  Impression: Technically successful ultrasound guided injection.  Impression and Recommendations:    Primary osteoarthritis of left shoulder X-rays at [facility], exam is consistent. Glenohumeral injection as above.  Morbid obesity ([place]) At this point we are going to proceed with bariatric surgery referral.

## 2016-11-28 NOTE — Assessment & Plan Note (Signed)
At this point we are going to proceed with bariatric surgery referral.

## 2016-12-04 ENCOUNTER — Encounter: Payer: Medicare Other | Admitting: Physical Therapy

## 2016-12-06 ENCOUNTER — Ambulatory Visit (INDEPENDENT_AMBULATORY_CARE_PROVIDER_SITE_OTHER): Payer: Medicare Other | Admitting: Physical Therapy

## 2016-12-06 ENCOUNTER — Encounter: Payer: Self-pay | Admitting: Physical Therapy

## 2016-12-06 DIAGNOSIS — M6281 Muscle weakness (generalized): Secondary | ICD-10-CM | POA: Diagnosis present

## 2016-12-06 DIAGNOSIS — M25612 Stiffness of left shoulder, not elsewhere classified: Secondary | ICD-10-CM | POA: Diagnosis not present

## 2016-12-06 DIAGNOSIS — G8929 Other chronic pain: Secondary | ICD-10-CM | POA: Diagnosis not present

## 2016-12-06 DIAGNOSIS — M25512 Pain in left shoulder: Secondary | ICD-10-CM | POA: Diagnosis not present

## 2016-12-06 NOTE — Therapy (Signed)
[place], [place], [address] Phone: [phone]   Fax:  [phone]  Physical Therapy Treatment  Patient Details  Name: Jeff Green MRN: [id number] Date of Birth: [birth year] Referring Provider: Dr Dianah Field  Encounter Date: 12/06/2016      PT End of Session - 12/06/16 0922    Visit Number 16   Number of Visits 24   Date for PT Re-Evaluation 12/13/16   PT Start Time 0925   PT Stop Time 1017   PT Time Calculation (min) 52 min   Activity Tolerance Patient tolerated treatment well      Past Medical History:  Diagnosis Date  . Atrial fibrillation ([place])   . CAD (coronary artery disease)    Stent placed; [place]  . Congestive heart failure (CHF) ([place])   . DVT (deep vein thrombosis) in pregnancy ([place])   . High cholesterol   . Hypertension   . Pulmonary embolus [facility])     Past Surgical History:  Procedure Laterality Date  . HERNIA REPAIR    . IVC  2014  . lamenectomy    . left orchiectomy      There were no vitals filed for this visit.      Subjective Assessment - 12/06/16 0929    Subjective Pt reports he had been babying his arm and it has settled down a lot.  After having a very deep massage the next morning he couldn't lift his arm at all.  Went to ED had an injection there and then another at the MD office the next week. Ever since then he has not been able to sleep, however now the arm is a little better.  He has tried to do a littel swimming.    Patient Stated Goals to strengthen back and come out feeling a whole lot better; to be able to walk comfortably, have less pain in Lt shoulder and be able to lift it again without pain   Currently in Pain? Yes   Pain Score 3    Pain Location Shoulder   Pain Orientation Left  deltoid   Pain Descriptors / Indicators Aching   Pain Type Chronic pain   Pain Onset More than a month ago   Pain Frequency Intermittent   Aggravating Factors  lifitng the arm overhead   Pain Relieving Factors TENS, rest            [facility] PT Assessment - 12/06/16 0001      Assessment   Medical Diagnosis LBP , Lt shoulder pain   Referring Provider Dr Dianah Field   Onset Date/Surgical Date 02/08/16   Hand Dominance Right     Observation/Other Assessments   Focus on Therapeutic Outcomes (FOTO)  61% limited as relates to his back      ROM / Strength   AROM / PROM / Strength AROM;PROM     AROM   Left Shoulder Extension 37 Degrees   Left Shoulder Flexion 128 Degrees  with pain   Left Shoulder ABduction 145 Degrees  with pain   Left Shoulder Internal Rotation 57 Degrees  then he began to use compensatory motion   Left Shoulder External Rotation 40 Degrees                     [facility] Adult PT Treatment/Exercise - 12/06/16 0001      Shoulder Exercises: Pulleys   Flexion 2 minutes   ABduction 3 minutes  Shoulder Exercises: Isometric Strengthening   Flexion 5X10"   Extension 5X10"   External Rotation 5X10"   Internal Rotation 5X10"   ABduction 5X10"     Shoulder Exercises: Stretch   External Rotation Stretch --  supine, low load 1#,long duration 3'   Other Shoulder Stretches doorway stretch low and mid x 45 secs     Modalities   Modalities Ultrasound     Ultrasound   Ultrasound Location Lt lateral bicep and ant deltoid   Ultrasound Parameters 100%, 1.81mHz, 1.1 w/cm2 with large sound head   Ultrasound Goals Pain     Manual Therapy   Manual Therapy Soft tissue mobilization;Taping   Soft tissue mobilization Lt bicep and deltoid STM   Kinesiotex Create Space     Kinesiotix   Create Space star pattern over muscle trigger point in Lt lateral bicep                PT Education - 12/06/16 0939    Education provided Yes   Education Details revamped HEP for shoulder Lt    Person(s) Educated Patient   Methods Explanation;Demonstration;Handout   Comprehension Returned  demonstration;Verbalized understanding             PT Long Term Goals - 12/06/16 0942      PT LONG TERM GOAL #1   Title Improve standing posture, alignment and balance with patient to demonstrate good upright posture with improve neuromotor control 12/13/16   Status Achieved     PT LONG TERM GOAL #2   Title Improve standing and walking tolerance to 20-30 min with decreased pain to 2/10 to 3/10  by 12/13/16   Status Achieved  uses cart at store sometimes if needed for fatigue not pain     PT LONG TERM GOAL #3   Title Increase strength bilat LE's to 5/-/5 to 5/5 throughout 12/13/16     PT LONG TERM GOAL #4   Title Independent in HEP 12/13/16   Status On-going     PT LONG TERM GOAL #5   Title Improve FOTO to </= 52% limitation 12/14/16   Status On-going  currently 61% limited     PT LONG TERM GOAL #6   Title report =/> 75% reduction of Lt shoulder pain to allow restoration of ROM (12/13/16)    Status On-going     PT LONG TERM GOAL #7   Title demo =/> 5+/5 strength of Lt shoulder without pain ( 78/24/18)    Status On-going               Plan - 12/06/16 1019    Clinical Impression Statement Jeff Green has improved motion in his Lt shoulder, we will take rehab very slowly for this to prevent another flare up of pain and limited motion.  He does have a trigger point in the Lt lateral bicep.     Rehab Potential Good   PT Frequency 2x / week   PT Duration 4 weeks   PT Treatment/Interventions Patient/family education;ADLs/Self Care Home Management;Cryotherapy;Electrical Stimulation;Iontophoresis 4mg /ml Dexamethasone;Moist Heat;Ultrasound;Dry needling;Manual techniques;Therapeutic activities;Therapeutic exercise;Balance training;Neuromuscular re-education;Gait training   PT Next Visit Plan assess tolerance to new HEP, and trigger point in Lt bicep, possible combo to that.    Consulted and Agree with Plan of Care Patient      Patient will benefit from skilled therapeutic  intervention in order to improve the following deficits and impairments:  Postural dysfunction, Improper body mechanics, Pain, Decreased strength, Decreased mobility, Decreased range of motion, Abnormal gait,  Decreased balance, Decreased activity tolerance, Impaired UE functional use, Increased muscle spasms  Visit Diagnosis: Muscle weakness (generalized)  Chronic left shoulder pain  Stiffness of left shoulder, not elsewhere classified     Problem List Patient Active Problem List   Diagnosis Date Noted  . Primary osteoarthritis of left shoulder 11/28/2016  . Diabetes mellitus type 2, diet-controlled ([place]) 06/08/2016  . Seborrheic dermatitis 06/08/2016  . Morbid obesity ([place]) 06/08/2016  . Ruptured tympanic membrane, bilateral 01/27/2016  . Annual physical exam 01/25/2016  . Paroxysmal atrial fibrillation ([place]) 06/15/2015  . Polycythemia, secondary 08/26/2014  . Hemochromatosis 08/26/2014  . Multiple pulmonary nodules 08/16/2014  . Testicular cancer ([place]) 08/12/2014  . Hyperlipidemia 08/10/2014  . Hypogonadism in male 08/10/2014  . Essential hypertension 08/10/2014  . Spinal stenosis of lumbar region 08/10/2014  . Recurrent pulmonary emboli ([place]) 08/10/2014  . CAD (coronary artery disease), native coronary artery 08/10/2014    [name] PT  12/06/2016, 10:20 AM  [facility] [place], [place], [address] Phone: [phone]   Fax:  [phone]  Name: Jeff Green MRN: [id number] Date of Birth: [birth year]

## 2016-12-06 NOTE — Patient Instructions (Addendum)
Strengthening: Isometric Flexion    Using wall for resistance, press right fist into ball using light pressure. Hold _10___ seconds. Repeat __5__ times per set. Do _1___ sets per session. Do __1__ sessions per day.  Strengthening: Isometric Extension    Using wall for resistance, press back of left arm into ball using light pressure. Hold __10__ seconds. Repeat _5___ times per set. Do __1__ sets per session. Do _1___ sessions per day.  Strengthening: Isometric Abduction    Using wall for resistance, press left arm into ball using light pressure. Hold __10__ seconds. Repeat __5__ times per set. Do __1__ sets per session. Do __1__ sessions per day.  Strengthening: Isometric External Rotation    Using wall to provide resistance, and keeping right arm at side, press back of hand into ball using light pressure. Hold __10__ seconds. Repeat __5__ times per set. Do __1__ sets per session. Do _1___ sessions per day.   Strengthening: Isometric Internal Rotation    Using door frame for resistance, press palm of right hand into ball using light pressure. Keep elbow in at side. Hold __10__ seconds. Repeat __5__ times per set. Do _1___ sets per session. Do __1__ sessions per day.  Scapula Adduction With Pectorals, Low - arms can be straight if more comfortable.    Stand in doorframe with palms against frame and arms at 45. Lean forward and squeeze shoulder blades. Hold _45__ seconds. Repeat _1__ times per session. Do _1__ sessions per day.  Scapula Adduction With Pectorals, Mid-Range, arms can be straight if more comfortable.    Stand in doorframe with palms against frame and arms at 90. Lean forward and squeeze shoulder blades. Hold _45__ seconds. Repeat _1__ times per session. Do _1__ sessions per day.

## 2016-12-10 DIAGNOSIS — G894 Chronic pain syndrome: Secondary | ICD-10-CM | POA: Diagnosis not present

## 2016-12-10 DIAGNOSIS — M79606 Pain in leg, unspecified: Secondary | ICD-10-CM | POA: Diagnosis not present

## 2016-12-10 DIAGNOSIS — Z79891 Long term (current) use of opiate analgesic: Secondary | ICD-10-CM | POA: Diagnosis not present

## 2016-12-10 DIAGNOSIS — M961 Postlaminectomy syndrome, not elsewhere classified: Secondary | ICD-10-CM | POA: Diagnosis not present

## 2016-12-10 DIAGNOSIS — Z79899 Other long term (current) drug therapy: Secondary | ICD-10-CM | POA: Diagnosis not present

## 2016-12-10 DIAGNOSIS — M25519 Pain in unspecified shoulder: Secondary | ICD-10-CM | POA: Diagnosis not present

## 2016-12-12 ENCOUNTER — Ambulatory Visit (INDEPENDENT_AMBULATORY_CARE_PROVIDER_SITE_OTHER): Payer: Medicare Other | Admitting: Physical Therapy

## 2016-12-12 DIAGNOSIS — G8929 Other chronic pain: Secondary | ICD-10-CM | POA: Diagnosis not present

## 2016-12-12 DIAGNOSIS — M6281 Muscle weakness (generalized): Secondary | ICD-10-CM

## 2016-12-12 DIAGNOSIS — M25512 Pain in left shoulder: Secondary | ICD-10-CM | POA: Diagnosis not present

## 2016-12-12 DIAGNOSIS — M25612 Stiffness of left shoulder, not elsewhere classified: Secondary | ICD-10-CM | POA: Diagnosis not present

## 2016-12-12 NOTE — Therapy (Signed)
[facility] [place], [place], [address] Phone: [phone]   Fax:  [phone]  Physical Therapy Treatment  Patient Details  Name: Jeff Green MRN: [id number] Date of Birth: [birth year] Referring Provider: Dr Dianah Field  Encounter Date: 12/12/2016      PT End of Session - 12/12/16 1152    Visit Number 17   Number of Visits 24   Date for PT Re-Evaluation 12/13/16   PT Start Time 1149   PT Stop Time 1230   PT Time Calculation (min) 41 min   Activity Tolerance Patient tolerated treatment well   Behavior During Therapy [facility] for tasks assessed/performed      Past Medical History:  Diagnosis Date  . Atrial fibrillation ([place])   . CAD (coronary artery disease)    Stent placed; [place]  . Congestive heart failure (CHF) ([place])   . DVT (deep vein thrombosis) in pregnancy ([place])   . High cholesterol   . Hypertension   . Pulmonary embolus [facility])     Past Surgical History:  Procedure Laterality Date  . HERNIA REPAIR    . IVC  2014  . lamenectomy    . left orchiectomy      There were no vitals filed for this visit.      Subjective Assessment - 12/12/16 1152    Subjective Pt reports the pain in his Lt shoulder has settled down overall.  "I must have slept in a weird position"- he awoke at 4am with pain in his lateral Lt shoulder that was 8/10.   He reports the Rock tape rolled right off; not sure if it helped.   Overall he reports 60% reduction in Lt shoulder pain since initiating therapy.    Currently in Pain? Yes   Pain Score 4    Pain Location Shoulder   Pain Orientation Left  deltoid   Pain Descriptors / Indicators Aching   Aggravating Factors  lifting arm overhead   Pain Relieving Factors TENS, rest.             [facility] PT Assessment - 12/12/16 0001      AROM   Right/Left Shoulder Right;Left   Right Shoulder Extension 49 Degrees   Right Shoulder Flexion 157 Degrees    Right Shoulder ABduction 142 Degrees   Right Shoulder Internal Rotation --  to T12   Left Shoulder Extension 38 Degrees  with tight feeling    Left Shoulder Flexion 137 Degrees  standing,  148 deg supine   Left Shoulder ABduction 130 Degrees   Left Shoulder Internal Rotation --  to SI joint with pain           [facility] Adult PT Treatment/Exercise - 12/12/16 0001      Shoulder Exercises: Supine   External Rotation AAROM;Left;10 reps  cane, 10 sec holds. VC for form   Flexion AAROM;Both;5 reps  with cane     Shoulder Exercises: Pulleys   Flexion 2 minutes   ABduction 3 minutes     Shoulder Exercises: Isometric Strengthening   Flexion --  5 sec x 10   Extension --  5 sec x 10   External Rotation --  5 sec x 10   Internal Rotation --  5 sec x 10   ABduction --  5 sec x 10      Shoulder Exercises: Stretch   Other Shoulder Stretches doorway stretch low and mid x 45 secs     Ultrasound  Ultrasound Location  Lt middle deltoid   Ultrasound Parameters 100%, 1.38mHz, 1.2 w/cm2,    Ultrasound Goals Pain     Manual Therapy   Soft tissue mobilization Lt bicep and deltoid STM   Kinesiotex Create Space;Inhibit Muscle     Kinesiotix   Create Space star pattern over muscle trigger point in Lt lateral bicep                     PT Long Term Goals - 12/06/16 0942      PT LONG TERM GOAL #1   Title Improve standing posture, alignment and balance with patient to demonstrate good upright posture with improve neuromotor control 12/13/16   Status Achieved     PT LONG TERM GOAL #2   Title Improve standing and walking tolerance to 20-30 min with decreased pain to 2/10 to 3/10  by 12/13/16   Status Achieved  uses cart at store sometimes if needed for fatigue not pain     PT LONG TERM GOAL #3   Title Increase strength bilat LE's to 5/-/5 to 5/5 throughout 12/13/16     PT LONG TERM GOAL #4   Title Independent in HEP 12/13/16   Status On-going     PT LONG TERM GOAL #5    Title Improve FOTO to </= 52% limitation 12/14/16   Status On-going  currently 61% limited     PT LONG TERM GOAL #6   Title report =/> 75% reduction of Lt shoulder pain to allow restoration of ROM (12/13/16)    Status On-going     PT LONG TERM GOAL #7   Title demo =/> 5+/5 strength of Lt shoulder without pain ( 78/24/18)    Status On-going               Plan - 12/12/16 1247    Clinical Impression Statement Pt had less palpable tightness in Lt lateral bicep today.  He tolerated treatment well, reporting dimished pain by end of session.  Pt's overall pain in shoulder has improved by 60%; progressing towards LTG#6.  We will continue to progress slowly with rehab to prevent another flare up.    Rehab Potential Good   PT Frequency 2x / week   PT Duration 4 weeks   PT Treatment/Interventions Patient/family education;ADLs/Self Care Home Management;Cryotherapy;Electrical Stimulation;Iontophoresis 4mg /ml Dexamethasone;Moist Heat;Ultrasound;Dry needling;Manual techniques;Therapeutic activities;Therapeutic exercise;Balance training;Neuromuscular re-education;Gait training   PT Next Visit Plan Assess goals and need for additional visits (end of POC).     Consulted and Agree with Plan of Care Patient      Patient will benefit from skilled therapeutic intervention in order to improve the following deficits and impairments:  Postural dysfunction, Improper body mechanics, Pain, Decreased strength, Decreased mobility, Decreased range of motion, Abnormal gait, Decreased balance, Decreased activity tolerance, Impaired UE functional use, Increased muscle spasms  Visit Diagnosis: Muscle weakness (generalized)  Chronic left shoulder pain  Stiffness of left shoulder, not elsewhere classified     Problem List Patient Active Problem List   Diagnosis Date Noted  . Primary osteoarthritis of left shoulder 11/28/2016  . Diabetes mellitus type 2, diet-controlled ([place]) 06/08/2016  . Seborrheic  dermatitis 06/08/2016  . Morbid obesity ([place]) 06/08/2016  . Ruptured tympanic membrane, bilateral 01/27/2016  . Annual physical exam 01/25/2016  . Paroxysmal atrial fibrillation ([place]) 06/15/2015  . Polycythemia, secondary 08/26/2014  . Hemochromatosis 08/26/2014  . Multiple pulmonary nodules 08/16/2014  . Testicular cancer ([place]) 08/12/2014  . Hyperlipidemia 08/10/2014  . Hypogonadism in  male 08/10/2014  . Essential hypertension 08/10/2014  . Spinal stenosis of lumbar region 08/10/2014  . Recurrent pulmonary emboli ([place]) 08/10/2014  . CAD (coronary artery disease), native coronary artery 08/10/2014   Kerin Perna, PTA 12/12/16 1:14 PM  [facility] [place], [place], [address] Phone: [phone]   Fax:  [phone]  Name: Kao Conry MRN: [id number] Date of Birth: Jun 20, 1955

## 2016-12-13 ENCOUNTER — Ambulatory Visit: Payer: Medicare Other

## 2016-12-14 ENCOUNTER — Ambulatory Visit (INDEPENDENT_AMBULATORY_CARE_PROVIDER_SITE_OTHER): Payer: Medicare Other | Admitting: Physical Therapy

## 2016-12-14 DIAGNOSIS — M25512 Pain in left shoulder: Secondary | ICD-10-CM

## 2016-12-14 DIAGNOSIS — G8929 Other chronic pain: Secondary | ICD-10-CM | POA: Diagnosis not present

## 2016-12-14 DIAGNOSIS — M6281 Muscle weakness (generalized): Secondary | ICD-10-CM | POA: Diagnosis not present

## 2016-12-14 DIAGNOSIS — M25612 Stiffness of left shoulder, not elsewhere classified: Secondary | ICD-10-CM

## 2016-12-14 NOTE — Therapy (Addendum)
[place], [place], [address] Phone: [phone]   Fax:  [phone]  Physical Therapy Treatment  Patient Details  Name: Jeff Green MRN: [id number] Date of Birth: Apr 18, 1956 Referring Provider: Dr. Dianah Field  Encounter Date: 12/14/2016      PT End of Session - 12/14/16 1154    Visit Number 18   Number of Visits 26   Date for PT Re-Evaluation 01/25/17   PT Start Time 2376   PT Stop Time 1232   PT Time Calculation (min) 47 min   Activity Tolerance --  pain in Lt shoudler with certain motions.   Behavior During Therapy [facility] for tasks assessed/performed      Past Medical History:  Diagnosis Date  . Atrial fibrillation ([place])   . CAD (coronary artery disease)    Stent placed; [place]  . Congestive heart failure (CHF) ([place])   . DVT (deep vein thrombosis) in pregnancy ([place])   . High cholesterol   . Hypertension   . Pulmonary embolus [facility])     Past Surgical History:  Procedure Laterality Date  . HERNIA REPAIR    . IVC  2014  . lamenectomy    . left orchiectomy      There were no vitals filed for this visit.      Subjective Assessment - 12/14/16 1249    Subjective Pt reports the Rock tape lasted longer this time.  He is unsure if it helped.  He feels he can lift his arm easier today.  Taking it easy has eased the pain.    Patient Stated Goals to strengthen back and come out feeling a whole lot better; to be able to walk comfortably, have less pain in Lt shoulder and be able to lift it again without pain   Currently in Pain? Yes   Pain Score 1    Pain Location Shoulder   Pain Orientation Left;Anterior;Lateral   Pain Descriptors / Indicators Dull   Aggravating Factors  lifting arm overhead    Pain Relieving Factors TENS, rest.             [facility] PT Assessment - 12/14/16 0001      Assessment   Medical Diagnosis LBP , Lt shoulder pain   Referring Provider Dr.  Dianah Field   Onset Date/Surgical Date 02/08/16   Hand Dominance Right   Next MD Visit 01/02/17     AROM   Right Shoulder Extension 49 Degrees   Right Shoulder Flexion 157 Degrees   Right Shoulder ABduction 142 Degrees   Right Shoulder Internal Rotation --  to T12   Left Shoulder Extension 38 Degrees  with tight feeling    Left Shoulder Flexion 137 Degrees  standing,  148 deg supine   Left Shoulder ABduction 130 Degrees   Left Shoulder Internal Rotation --  to SI joint with pain           [facility] Adult PT Treatment/Exercise - 12/14/16 0001      Shoulder Exercises: Standing   Flexion Limitations rolling large orange ball up wall to stretch x 2 reps      Shoulder Exercises: Pulleys   Flexion 3 minutes   ABduction 2 minutes     Shoulder Exercises: ROM/Strengthening   Ball on Wall Lt hand on ball with shoulder at 90 deg flex,small circles CW/CCW - caused increased pain and crepitus; stopped.    Other ROM/Strengthening Exercises wall ladder, flexion, LUE x 3 reps  Other ROM/Strengthening Exercises seated Lt shoulder flexion with scap retraction x 10 reps      Shoulder Exercises: Stretch   Other Shoulder Stretches doorway stretch low and mid x 30 secs, 4 reps each position     Ultrasound   Ultrasound Location Lt prox bicep, Lt lateral bicep, Lt teres maj    Ultrasound Parameters 50%, 1.2 w/cm2 for ant and lateral Lt shoulder, 100% at post Lt shoulder -10 min total   Ultrasound Goals Pain  tightness     Manual Therapy   Manual therapy comments X pattern of Rock tape with 50% stretch applied to Lt prox bicep tendon and at teres maj to decompress tissue, decrease pain, increase proprioception.    Soft tissue mobilization Edge tool assistance to Lt ant/lat/ post shoulder to decrease fascial adhesions; STM to Lt lateral bicep and infraspinatus                      PT Long Term Goals - 12/06/16 0942      PT LONG TERM GOAL #1   Title Improve standing posture,  alignment and balance with patient to demonstrate good upright posture with improve neuromotor control 12/13/16   Status Achieved     PT LONG TERM GOAL #2   Title Improve standing and walking tolerance to 20-30 min with decreased pain to 2/10 to 3/10  by 12/13/16   Status Achieved  uses cart at store sometimes if needed for fatigue not pain     PT LONG TERM GOAL #3   Title Increase strength bilat LE's to 5/-/5 to 5/5 throughout 12/13/16     PT LONG TERM GOAL #4   Title Independent in HEP 12/13/16   Status On-going     PT LONG TERM GOAL #5   Title Improve FOTO to </= 52% limitation 01/25/17   Status On-going  currently 61% limited     PT LONG TERM GOAL #6   Title report =/> 75% reduction of Lt shoulder pain to allow restoration of ROM (01/25/17)    Status On-going     PT LONG TERM GOAL #7   Title demo =/> 4+/5 strength of Lt shoulder without pain ( 01/25/17)    Status On-going               Plan - 12/14/16 1630    Clinical Impression Statement Pt reporting less pain in Lt shoulder with active Lt shoulder flexion.  He had some palpable tightness in Lt teres major and lateral bicep; reduced with manual therapy.  Pt has made some progress since most recent flare up and will benefit from continued PT intervention to maximize functional mobility.    Rehab Potential Good   PT Frequency 2x / week   PT Duration 6 weeks   PT Treatment/Interventions Patient/family education;ADLs/Self Care Home Management;Cryotherapy;Electrical Stimulation;Iontophoresis 4mg /ml Dexamethasone;Moist Heat;Ultrasound;Dry needling;Manual techniques;Therapeutic activities;Therapeutic exercise;Balance training;Neuromuscular re-education;Gait training   PT Next Visit Plan spoke to supervising PT regarding progress; will request additional visits for shoulder.    Consulted and Agree with Plan of Care Patient      Patient will benefit from skilled therapeutic intervention in order to improve the following deficits  and impairments:  Postural dysfunction, Improper body mechanics, Pain, Decreased strength, Decreased mobility, Decreased range of motion, Abnormal gait, Decreased balance, Decreased activity tolerance, Impaired UE functional use, Increased muscle spasms  Visit Diagnosis: Muscle weakness (generalized)  Chronic left shoulder pain  Stiffness of left shoulder, not elsewhere classified  Problem List Patient Active Problem List   Diagnosis Date Noted  . Primary osteoarthritis of left shoulder 11/28/2016  . Diabetes mellitus type 2, diet-controlled ([place]) 06/08/2016  . Seborrheic dermatitis 06/08/2016  . Morbid obesity ([place]) 06/08/2016  . Ruptured tympanic membrane, bilateral 01/27/2016  . Annual physical exam 01/25/2016  . Paroxysmal atrial fibrillation ([place]) 06/15/2015  . Polycythemia, secondary 08/26/2014  . Hemochromatosis 08/26/2014  . Multiple pulmonary nodules 08/16/2014  . Testicular cancer ([place]) 08/12/2014  . Hyperlipidemia 08/10/2014  . Hypogonadism in male 08/10/2014  . Essential hypertension 08/10/2014  . Spinal stenosis of lumbar region 08/10/2014  . Recurrent pulmonary emboli ([place]) 08/10/2014  . CAD (coronary artery disease), native coronary artery 08/10/2014   Kerin Perna, PTA 12/14/16 4:34 PM  [place], [place], [address] Phone: [phone]   Fax:  [phone]  Name: Bridget Westbrooks MRN: [id number] Date of Birth: December 26, 1955   [name], PT 12/19/16 9:09 AM

## 2016-12-19 ENCOUNTER — Encounter: Payer: Medicare Other | Admitting: Physical Therapy

## 2016-12-19 NOTE — Addendum Note (Signed)
Addended by: Karalyn Kadel E on: 12/19/2016 09:10 AM   Modules accepted: Orders

## 2016-12-21 ENCOUNTER — Ambulatory Visit (INDEPENDENT_AMBULATORY_CARE_PROVIDER_SITE_OTHER): Payer: Medicare Other | Admitting: Physical Therapy

## 2016-12-21 DIAGNOSIS — G8929 Other chronic pain: Secondary | ICD-10-CM

## 2016-12-21 DIAGNOSIS — M6281 Muscle weakness (generalized): Secondary | ICD-10-CM

## 2016-12-21 DIAGNOSIS — M25612 Stiffness of left shoulder, not elsewhere classified: Secondary | ICD-10-CM

## 2016-12-21 DIAGNOSIS — M25512 Pain in left shoulder: Secondary | ICD-10-CM

## 2016-12-21 NOTE — Therapy (Addendum)
[place], [place], [address] Phone: [phone]   Fax:  [phone]  Physical Therapy Treatment  Patient Details  Name: Jeff Green MRN: [id number] Date of Birth: 1955-10-09 Referring Provider: Dr. Dianah Field  Encounter Date: 12/21/2016      PT End of Session - 12/21/16 0934    Visit Number 19   Number of Visits 26   Date for PT Re-Evaluation 01/25/17   PT Start Time 0934   PT Stop Time 1015   PT Time Calculation (min) 41 min   Activity Tolerance Patient tolerated treatment well   Behavior During Therapy [facility] for tasks assessed/performed      Past Medical History:  Diagnosis Date  . Atrial fibrillation ([place])   . CAD (coronary artery disease)    Stent placed; [place]  . Congestive heart failure (CHF) ([place])   . DVT (deep vein thrombosis) in pregnancy ([place])   . High cholesterol   . Hypertension   . Pulmonary embolus [facility])     Past Surgical History:  Procedure Laterality Date  . HERNIA REPAIR    . IVC  2014  . lamenectomy    . left orchiectomy      There were no vitals filed for this visit.      Subjective Assessment - 12/21/16 0937    Subjective Pt reports he hasn't been able to sleep since he had his injections on 8/5.  He is still limited in swimming, has pain with all strokes except sidestroke.   He has been using red and blue band every other day with exercises (shoulder ext, rowing, supine ER)   Pertinent History Back injury 1988; bilat knee pain; cardiac stent ~5 yrs ago    Patient Stated Goals  have less pain in Lt shoulder and be able to lift it again without pain   Currently in Pain? Yes   Pain Score 2    Pain Location Shoulder   Pain Orientation Left;Anterior;Lateral   Aggravating Factors  lifting arm overhead, swim strokes with thumb down (breast stroke)   Pain Relieving Factors TENS, Rest            [facility] PT Assessment - 12/21/16 0001      Assessment   Medical Diagnosis LBP , Lt shoulder pain   Referring Provider Dr. Dianah Field   Onset Date/Surgical Date 02/08/16   Hand Dominance Right   Next MD Visit 01/02/17     ROM / Strength   AROM / PROM / Strength AROM;PROM     AROM   AROM Assessment Site Shoulder   Right/Left Shoulder Left;Right   Right Shoulder External Rotation 60 Degrees   Left Shoulder Flexion 132 Degrees  sitting   Left Shoulder External Rotation 40 Degrees     PROM   PROM Assessment Site Shoulder   Right/Left Shoulder Left   Left Shoulder Flexion 140 Degrees  with pulleys     Strength   Left Shoulder ABduction --  5-/5   Left Shoulder Internal Rotation 4+/5  with pain   Left Shoulder External Rotation 4/5  with popping           [facility] Adult PT Treatment/Exercise - 12/21/16 0001      Shoulder Exercises: Supine   Horizontal ABduction Strengthening;Both;10 reps;Theraband   Theraband Level (Shoulder Horizontal ABduction) Level 2 (Red)   External Rotation Strengthening;Left;12 reps  3 sec holds   Theraband Level (Shoulder External Rotation) Level 2 (Red)   Flexion  Both;10 reps;Theraband  overhead pull, to tolerance   Other Supine Exercises sash with yellow band LUE x 10, repeated with red   Other Supine Exercises simulated swim strokes with arms x 10 (monkey, airplane, soldier) and backfloat with arms at side propelling with wrists extended - options for when he swims.       Shoulder Exercises: Pulleys   Flexion 3 minutes   ABduction 2 minutes     Shoulder Exercises: ROM/Strengthening   UBE (Upper Arm Bike) L1: 4 min, alt forward/backward      Shoulder Exercises: Stretch   Other Shoulder Stretches bilat bicep stretch at door x 30 sec x 3 reps      Modalities   Modalities --  pt declined, will do at home if needed.                PT Education - 12/21/16 1013    Education provided Yes   Education Details HEP             PT Long Term Goals - 12/21/16 1352       PT LONG TERM GOAL #1   Title Improve standing posture, alignment and balance with patient to demonstrate good upright posture with improve neuromotor control 12/13/16   Time 10   Period Weeks   Status Achieved     PT LONG TERM GOAL #2   Title Improve standing and walking tolerance to 20-30 min with decreased pain to 2/10 to 3/10  by 12/13/16   Time 10   Period Weeks   Status Achieved     PT LONG TERM GOAL #3   Title Increase strength bilat LE's to 5/-/5 to 5/5 throughout 12/13/16   Time 10   Period Weeks   Status On-going     PT LONG TERM GOAL #4   Title Independent in HEP 01/25/17   Time 10   Period Weeks   Status On-going     PT LONG TERM GOAL #5   Title Improve FOTO to </= 52% limitation 01/25/17   Time 10   Period Weeks   Status On-going     PT LONG TERM GOAL #6   Title report =/> 75% reduction of Lt shoulder pain to allow restoration of ROM -01/28/17   Time 10   Period Weeks   Status Partially Met  improved pain rating, however limited ROM.      PT LONG TERM GOAL #7   Title demo =/> 5/5 strength of Lt shoulder without pain 01/28/17   Time 10   Period Weeks   Status Partially Met               Plan - 12/21/16 1355    Clinical Impression Statement Pt tolerated revisited Lt shoulder exercises with no increase in pain, just fatigue. Pt demonstrated improved strength in Lt shoulder, however some pain and popping with break test.   Pt making gradual gains towards established goals.    Rehab Potential Good   PT Frequency 2x / week   PT Duration 4 weeks   PT Treatment/Interventions Patient/family education;ADLs/Self Care Home Management;Cryotherapy;Electrical Stimulation;Iontophoresis 40m/ml Dexamethasone;Moist Heat;Ultrasound;Dry needling;Manual techniques;Therapeutic activities;Therapeutic exercise;Balance training;Neuromuscular re-education;Gait training   PT Next Visit Plan Continue progressive Lt shoulder ROM/strengthening, avoiding flare up of symptoms.     Consulted and Agree with Plan of Care Patient      Patient will benefit from skilled therapeutic intervention in order to improve the following deficits and impairments:  Postural dysfunction, Improper body mechanics, Pain,  Decreased strength, Decreased mobility, Decreased range of motion, Abnormal gait, Decreased balance, Decreased activity tolerance, Impaired UE functional use, Increased muscle spasms  Visit Diagnosis: Muscle weakness (generalized)  Chronic left shoulder pain  Stiffness of left shoulder, not elsewhere classified     Problem List Patient Active Problem List   Diagnosis Date Noted  . Primary osteoarthritis of left shoulder 11/28/2016  . Diabetes mellitus type 2, diet-controlled ([place]) 06/08/2016  . Seborrheic dermatitis 06/08/2016  . Morbid obesity ([place]) 06/08/2016  . Ruptured tympanic membrane, bilateral 01/27/2016  . Annual physical exam 01/25/2016  . Paroxysmal atrial fibrillation ([place]) 06/15/2015  . Polycythemia, secondary 08/26/2014  . Hemochromatosis 08/26/2014  . Multiple pulmonary nodules 08/16/2014  . Testicular cancer ([place]) 08/12/2014  . Hyperlipidemia 08/10/2014  . Hypogonadism in male 08/10/2014  . Essential hypertension 08/10/2014  . Spinal stenosis of lumbar region 08/10/2014  . Recurrent pulmonary emboli ([place]) 08/10/2014  . CAD (coronary artery disease), native coronary artery 08/10/2014   Kerin Perna, PTA 12/21/16 4:34 PM  [facility] [place], [place], [address] Phone: [phone]   Fax:  [phone]  Name: Jeff Green MRN: [id number] Date of Birth: 1955-10-01   PHYSICAL THERAPY DISCHARGE SUMMARY  Visits from Start of Care: 19  Current functional level related to goals / functional outcomes: unknown   Remaining deficits: unknown   Education / Equipment: HEP  Plan:                                                    Patient goals were  partially met. Patient is being discharged due to not returning since the last visit.  ?????Pt was having multiple issues with his shoulder.  LBP was resolved as of his last visit in August    Susan Shaver, PT 02/14/17 8:17 AM

## 2016-12-21 NOTE — Patient Instructions (Signed)
Over Head Pull: Narrow Grip     [place] [phone]   On back, knees bent, feet flat, band across thighs, elbows straight but relaxed. Pull hands apart (start). Keeping elbows straight, bring arms up and over head, hands toward floor. Keep pull steady on band. Hold momentarily. Return slowly, keeping pull steady, back to start. Repeat _10__ times, 2-3 sets. Band color ___red___   Side Pull: Double Arm   On back, knees bent, feet flat. Arms perpendicular to body, shoulder level, elbows straight but relaxed. Pull arms out to sides, elbows straight. Resistance band comes across collarbones, hands toward floor. Hold momentarily. Slowly return to starting position. Repeat __10_ times, 2-3 sets. Band color __red___   Elmer Picker   On back, knees bent, feet flat, left hand on left hip, right hand above left. Pull right arm DIAGONALLY (hip to shoulder) across chest. Bring right arm along head toward floor. Hold momentarily. Slowly return to starting position. Repeat _10__ times, 2-3 sets. Do with left arm. Band color __yellow/red____   Shoulder Rotation: Double Arm   On back, knees bent, feet flat, elbows tucked at sides, bent 90, hands palms up. Pull hands apart and down toward floor, keeping elbows near sides. Hold momentarily. Slowly return to starting position. Repeat _10__ times. Band color __red____

## 2017-01-02 ENCOUNTER — Ambulatory Visit: Payer: Medicare Other | Admitting: Sports Medicine

## 2017-01-02 ENCOUNTER — Encounter: Payer: Medicare Other | Admitting: Physical Therapy

## 2017-01-04 ENCOUNTER — Encounter: Payer: Medicare Other | Admitting: Physical Therapy

## 2017-01-09 ENCOUNTER — Other Ambulatory Visit: Payer: Self-pay

## 2017-01-09 MED ORDER — TELMISARTAN-HCTZ 80-12.5 MG PO TABS
1.0000 | ORAL_TABLET | Freq: Every day | ORAL | 1 refills | Status: DC
Start: 2017-01-09 — End: 2017-05-29

## 2017-01-21 ENCOUNTER — Other Ambulatory Visit: Payer: Self-pay | Admitting: Sports Medicine

## 2017-01-29 ENCOUNTER — Other Ambulatory Visit: Payer: Self-pay | Admitting: Sports Medicine

## 2017-02-04 DIAGNOSIS — M79606 Pain in leg, unspecified: Secondary | ICD-10-CM | POA: Diagnosis not present

## 2017-02-04 DIAGNOSIS — M25519 Pain in unspecified shoulder: Secondary | ICD-10-CM | POA: Diagnosis not present

## 2017-02-04 DIAGNOSIS — M961 Postlaminectomy syndrome, not elsewhere classified: Secondary | ICD-10-CM | POA: Diagnosis not present

## 2017-02-04 DIAGNOSIS — Z79891 Long term (current) use of opiate analgesic: Secondary | ICD-10-CM | POA: Diagnosis not present

## 2017-02-04 DIAGNOSIS — G894 Chronic pain syndrome: Secondary | ICD-10-CM | POA: Diagnosis not present

## 2017-02-04 DIAGNOSIS — Z79899 Other long term (current) drug therapy: Secondary | ICD-10-CM | POA: Diagnosis not present

## 2017-03-21 ENCOUNTER — Ambulatory Visit (INDEPENDENT_AMBULATORY_CARE_PROVIDER_SITE_OTHER): Payer: Medicare Other | Admitting: Sports Medicine

## 2017-03-21 DIAGNOSIS — E119 Type 2 diabetes mellitus without complications: Secondary | ICD-10-CM | POA: Diagnosis not present

## 2017-03-21 DIAGNOSIS — E785 Hyperlipidemia, unspecified: Secondary | ICD-10-CM

## 2017-03-21 DIAGNOSIS — G4733 Obstructive sleep apnea (adult) (pediatric): Secondary | ICD-10-CM | POA: Insufficient documentation

## 2017-03-21 DIAGNOSIS — I2699 Other pulmonary embolism without acute cor pulmonale: Secondary | ICD-10-CM | POA: Diagnosis not present

## 2017-03-21 DIAGNOSIS — M19012 Primary osteoarthritis, left shoulder: Secondary | ICD-10-CM | POA: Diagnosis not present

## 2017-03-21 DIAGNOSIS — E291 Testicular hypofunction: Secondary | ICD-10-CM

## 2017-03-21 DIAGNOSIS — G4719 Other hypersomnia: Secondary | ICD-10-CM | POA: Diagnosis not present

## 2017-03-21 HISTORY — DX: Obstructive sleep apnea (adult) (pediatric): G47.33

## 2017-03-21 MED ORDER — PHENTERMINE HCL 37.5 MG PO TABS: ORAL_TABLET | ORAL | 0 refills | Status: DC

## 2017-03-21 MED ORDER — TESTOSTERONE CYPIONATE 200 MG/ML IM SOLN
INTRAMUSCULAR | 3 refills | Status: DC
Start: 2017-03-21 — End: 2017-12-13

## 2017-03-21 NOTE — Assessment & Plan Note (Signed)
Morbid obesity, excessive daytime sleepiness, large neck, this patient is a set up for sleep apnea, ordering a sleep study.

## 2017-03-21 NOTE — Assessment & Plan Note (Signed)
Bariatric referral, adding phentermine, return monthly for weight checks and refills.

## 2017-03-21 NOTE — Assessment & Plan Note (Signed)
INR goal 2-3, unable to check INR point-of-care, ordering a lab INR.

## 2017-03-21 NOTE — Assessment & Plan Note (Signed)
Rechecking testosterone levels, refilling testosterone

## 2017-03-21 NOTE — Assessment & Plan Note (Addendum)
Rechecking lipids, triglycerides were slightly elevated on the recent check. I will add fenofibrate if still elevated.  Triglycerides are still elevated, adding fenofibrate, recheck in 2 months.

## 2017-03-21 NOTE — Progress Notes (Addendum)
Subjective:    CC: Left shoulder pain  HPI: This is a pleasant 61 year old male, he has known left glenohumeral osteoarthritis, the previous injection was 3 and half months ago with good relief, desires repeat interventional treatment today, pain is moderate, persistent, localized at the anterior and posterior joint lines without radiation.  Excessive daytime sleepiness: He does have morbid obesity, large neck, finds himself falling asleep at random times during the day.  Diabetes mellitus type 2: Stable but needs A1c rechecked.  Hyperlipidemia: Triglycerides were slightly high in the recent check.  Morbid obesity: Stable, he is agreeable to proceed with bariatric surgery would also like to try some phentermine over the next couple of months  History of multiple pulmonary emboli: Unable to obtain INR here, needs to have it done downstairs.  Male hypogonadism: Needs a refill on testosterone.  Past medical history:  Negative.  See flowsheet/record as well for more information.  Surgical history: Negative.  See flowsheet/record as well for more information.  Family history: Negative.  See flowsheet/record as well for more information.  Social history: Negative.  See flowsheet/record as well for more information.  Allergies, and medications have been entered into the medical record, reviewed, and no changes needed.   Review of Systems: No fevers, chills, night sweats, weight loss, chest pain, or shortness of breath.   Objective:    General: Well Developed, well nourished, and in no acute distress.  Neuro: Alert and oriented x3, extra-ocular muscles intact, sensation grossly intact.  HEENT: Normocephalic, atraumatic, pupils equal round reactive to light, neck supple, no masses, no lymphadenopathy, thyroid nonpalpable.  Skin: Warm and dry, no rashes. Cardiac: Regular rate and rhythm, no murmurs rubs or gallops, no lower extremity edema.  Respiratory: Clear to auscultation bilaterally.  Not using accessory muscles, speaking in full sentences.  Procedure: Real-time Ultrasound Guided Injection of left glenohumeral joint Device: GE Logiq E  Verbal informed consent obtained.  Time-out conducted.  Noted no overlying erythema, induration, or other signs of local infection.  Skin prepped in a sterile fashion.  Local anesthesia: Topical Ethyl chloride.  With sterile technique and under real time ultrasound guidance: 22-gauge spinal needle advanced into the joint, 1 cc kenalog 40, 2 cc lidocaine, 2 cc bupivacaine injected easily Completed without difficulty  Pain immediately resolved suggesting accurate placement of the medication.  Advised to call if fevers/chills, erythema, induration, drainage, or persistent bleeding.  Images permanently stored and available for review in the ultrasound unit.  Impression: Technically successful ultrasound guided injection.  Impression and Recommendations:    Diabetes mellitus type 2, diet-controlled (HCC) Rechecking hemoglobin A1c.  Starting Ozempic, if this does not work (not approved) we will try Trulicity.  Hyperlipidemia Rechecking lipids, triglycerides were slightly elevated on the recent check. I will add fenofibrate if still elevated.  Triglycerides are still elevated, adding fenofibrate, recheck in 2 months.  Excessive daytime sleepiness Morbid obesity, excessive daytime sleepiness, large neck, this patient is a set up for sleep apnea, ordering a sleep study.  Primary osteoarthritis of left shoulder Previous injection was 3-1/2 months ago, repeat left glenohumeral joint injection as above. Physical therapy per patient request.  Recurrent pulmonary emboli INR goal 2-3, unable to check INR point-of-care, ordering a lab INR.  Hypogonadism in male Rechecking testosterone levels, refilling testosterone  Morbid obesity ([place]) Bariatric referral, adding phentermine, return monthly for weight checks and refills.  I spent 40  minutes with this patient, greater than 50% was face-to-face time counseling regarding the above diagnoses, this was  separate from the time spent performing the above procedure ___________________________________________ Gwen Her. Dianah Field, M.D., ABFM., CAQSM. Primary Care and [place] Instructor of [employer] [facility]

## 2017-03-21 NOTE — Assessment & Plan Note (Addendum)
Rechecking hemoglobin A1c.  Starting Ozempic, if this does not work (not approved) we will try Trulicity.

## 2017-03-21 NOTE — Assessment & Plan Note (Addendum)
Previous injection was 3-1/2 months ago, repeat left glenohumeral joint injection as above. Physical therapy per patient request.

## 2017-03-25 ENCOUNTER — Telehealth: Payer: Self-pay | Admitting: Sports Medicine

## 2017-03-25 NOTE — Telephone Encounter (Signed)
Has this been worsened since he started phentermine?  If so he should break it in half and do 1/2 pill daily for a week and then back to a full pill daily.  If not I can send in some Belsomra.

## 2017-03-25 NOTE — Telephone Encounter (Signed)
Pt calls and states that he is not sleeping, hasnt had but maybe 3 hours sleep in the last 3 days. Tried the Tylenol PM and Benedryl like you suggested and these did not help. Has also tried Ambien in the past that did not work. Would like something called into pharmacy for sleep. Please advise. KG LPN

## 2017-03-26 ENCOUNTER — Ambulatory Visit: Payer: Medicare Other | Admitting: Physical Therapy

## 2017-03-26 MED ORDER — SUVOREXANT 10 MG PO TABS: 1.0000 | ORAL_TABLET | Freq: Every day | ORAL | 0 refills | Status: DC

## 2017-03-26 NOTE — Telephone Encounter (Signed)
I am going to add Belsomra but have him do a half tab of phentermine instead of a full tab.  This may obviate any fluttering sensations.  Everyone gets some palpitations initially.  I am going to put a prescription for Belsomra with a free trial coupon stapled to it.  He needs to pick this up.  If it works then I can call in a month supply.

## 2017-03-26 NOTE — Telephone Encounter (Signed)
Pt notified of MD instructions. Will pick up script in office and call if any further problems. KG LPN

## 2017-03-26 NOTE — Telephone Encounter (Signed)
Spoke with patient and he states that he stopped the phentermine yesterday noticed his heart fluttering some and sleep had gotten worse since starting it so would like to try the [place]. Send to [facility]  in [place]. KG LPN

## 2017-03-27 ENCOUNTER — Ambulatory Visit (INDEPENDENT_AMBULATORY_CARE_PROVIDER_SITE_OTHER): Payer: Medicare Other | Admitting: Physical Therapy

## 2017-03-27 DIAGNOSIS — M25512 Pain in left shoulder: Secondary | ICD-10-CM

## 2017-03-27 DIAGNOSIS — M25612 Stiffness of left shoulder, not elsewhere classified: Secondary | ICD-10-CM

## 2017-03-27 DIAGNOSIS — M6281 Muscle weakness (generalized): Secondary | ICD-10-CM | POA: Diagnosis not present

## 2017-03-27 DIAGNOSIS — G8929 Other chronic pain: Secondary | ICD-10-CM | POA: Diagnosis not present

## 2017-03-27 DIAGNOSIS — R2689 Other abnormalities of gait and mobility: Secondary | ICD-10-CM | POA: Diagnosis not present

## 2017-03-27 NOTE — Patient Instructions (Addendum)
Scapular Retraction: Elevation (Standing facing a wall)    With arms over head, pinch shoulder blades together and press arms slightly back. Repeat _5-15___ times per set. Do __1-3__ sets per session, don't push into pain greater than 4-5/10. Do __1__ sessions per day.  Scapular: Protraction - 90 of Flexion, then add in circles clockwise and counterclockwise    Holding __1-2__ pound weights, attempt to push arms up toward ceiling, move arms in circles, both directions. Repeat __10-20__ times per set. Do __1__ sets per session. Do __1__ sessions per day.  Scapular Retraction (Prone)    Lie with arms above head. Pinch shoulder blades together, lifting arms. Don't push into pain greater than 4-5/10 Repeat __5-15__ times per set. Do __1__ sets per session. Do __1__ sessions per day.  Balance: Three-Way Leg Swing - do next to a counter for safety, hold as needed for balance.     Stand on left foot, hands on hips. Reach other foot forward __1__ times, sideways __1__ times, back __1__ times. Hold each position __1__ seconds. Relax. Repeat __5-10__ times per set. Do _1-3_ sets per session. Do __1__ sessions per day. Repeat on the other side.   Flat Bicycle    Lie on back, legs toward ceiling. Flattening the stomach, begin pedaling with feet flexed. Keep the motion smooth and even, inhale __2__ circles, exhale __2__ circles. Repeat _10-20___ times. Pedal _10-20___ circles in reverse. Do __1__ sessions per day.  Lew Dawes on back, legs toward ceiling. Turn legs all the way out, flex feet, bend knees halfway. Exhale, lowering legs to 45 above floor. Inhale, returning. Repeat _10-20___ times. Do __1__ sessions per day.
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
## 2017-03-28 DIAGNOSIS — E119 Type 2 diabetes mellitus without complications: Secondary | ICD-10-CM | POA: Diagnosis not present

## 2017-03-28 DIAGNOSIS — E291 Testicular hypofunction: Secondary | ICD-10-CM | POA: Diagnosis not present

## 2017-03-28 DIAGNOSIS — I2699 Other pulmonary embolism without acute cor pulmonale: Secondary | ICD-10-CM | POA: Diagnosis not present

## 2017-03-28 DIAGNOSIS — E785 Hyperlipidemia, unspecified: Secondary | ICD-10-CM | POA: Diagnosis not present

## 2017-03-29 MED ORDER — SEMAGLUTIDE (1 MG/DOSE) 2 MG/1.5ML ~~LOC~~ SOPN: 1.0000 mg | PEN_INJECTOR | SUBCUTANEOUS | 11 refills | Status: DC

## 2017-03-29 MED ORDER — FENOFIBRATE 160 MG PO TABS: 160.0000 mg | ORAL_TABLET | Freq: Every day | ORAL | 3 refills | Status: DC

## 2017-03-29 MED ORDER — SEMAGLUTIDE(0.25 OR 0.5MG/DOS) 2 MG/1.5ML ~~LOC~~ SOPN: 0.5000 mg | PEN_INJECTOR | SUBCUTANEOUS | 11 refills | Status: DC

## 2017-03-29 NOTE — Addendum Note (Signed)
Addended by: Silverio Decamp on: 03/29/2017 09:10 AM   Modules accepted: Orders

## 2017-04-01 LAB — LIPID PANEL W/REFLEX DIRECT LDL
Cholesterol: 145 mg/dL (ref <200)
HDL: 39 mg/dL — ABNORMAL LOW (ref >40)
LDL Cholesterol (Calc): 61 mg/dL
Non-HDL Cholesterol (Calc): 106 mg/dL (ref <130)
Total CHOL/HDL Ratio: 3.7 (calc) (ref <5.0)
Triglycerides: 370 mg/dL — ABNORMAL HIGH (ref <150)

## 2017-04-01 LAB — COMPREHENSIVE METABOLIC PANEL
AST: 30 U/L (ref 10–35)
Albumin: 4.4 g/dL (ref 3.6–5.1)
Alkaline phosphatase (APISO): 71 U/L (ref 40–115)
BUN: 24 mg/dL (ref 7–25)
Calcium: 10.4 mg/dL — ABNORMAL HIGH (ref 8.6–10.3)
Chloride: 100 mmol/L (ref 98–110)
Creat: 1.33 mg/dL (ref 0.70–1.33)
Globulin: 2.8 g/dL (calc) (ref 1.9–3.7)
Total Bilirubin: 0.8 mg/dL (ref 0.2–1.2)

## 2017-04-01 LAB — COMPREHENSIVE METABOLIC PANEL WITH GFR
AG Ratio: 1.6 (calc) (ref 1.0–2.5)
ALT: 43 U/L (ref 9–46)
CO2: 26 mmol/L (ref 20–32)
Glucose, Bld: 285 mg/dL — ABNORMAL HIGH (ref 65–99)
Potassium: 4.7 mmol/L (ref 3.5–5.3)
Sodium: 137 mmol/L (ref 135–146)
Total Protein: 7.2 g/dL (ref 6.1–8.1)

## 2017-04-01 LAB — TESTOSTERONE, FREE & TOTAL
Free Testosterone: 61.2 pg/mL (ref 35.0–155.0)
Testosterone, Total, LC-MS-MS: 342 ng/dL (ref 250–1100)

## 2017-04-01 LAB — CBC
HCT: 50.5 % — ABNORMAL HIGH (ref 38.5–50.0)
Hemoglobin: 17.7 g/dL — ABNORMAL HIGH (ref 13.2–17.1)
MCH: 31.3 pg (ref 27.0–33.0)
MCHC: 35 g/dL (ref 32.0–36.0)
MCV: 89.4 fL (ref 80.0–100.0)
MPV: 14.3 fL — ABNORMAL HIGH (ref 7.5–12.5)
Platelets: 161 10*3/uL (ref 140–400)
RBC: 5.65 10*6/uL (ref 4.20–5.80)
RDW: 13.3 % (ref 11.0–15.0)
WBC: 11.2 Thousand/uL — ABNORMAL HIGH (ref 3.8–10.8)

## 2017-04-01 LAB — HEMOGLOBIN A1C
Hgb A1c MFr Bld: 8.7 %{Hb} — ABNORMAL HIGH (ref <5.7)
Mean Plasma Glucose: 203 (calc)
eAG (mmol/L): 11.2 (calc)

## 2017-04-01 LAB — PROTIME-INR
INR: 2 — ABNORMAL HIGH
Prothrombin Time: 20.8 s — ABNORMAL HIGH (ref 9.0–11.5)

## 2017-04-02 ENCOUNTER — Encounter: Payer: Medicare Other | Admitting: Physical Therapy

## 2017-04-04 ENCOUNTER — Encounter: Payer: Self-pay | Admitting: Physical Therapy

## 2017-04-04 ENCOUNTER — Ambulatory Visit (INDEPENDENT_AMBULATORY_CARE_PROVIDER_SITE_OTHER): Payer: Medicare Other | Admitting: Physical Therapy

## 2017-04-04 ENCOUNTER — Telehealth: Payer: Self-pay

## 2017-04-04 DIAGNOSIS — M6281 Muscle weakness (generalized): Secondary | ICD-10-CM | POA: Diagnosis not present

## 2017-04-04 DIAGNOSIS — M25512 Pain in left shoulder: Secondary | ICD-10-CM | POA: Diagnosis not present

## 2017-04-04 DIAGNOSIS — G4730 Sleep apnea, unspecified: Secondary | ICD-10-CM | POA: Diagnosis not present

## 2017-04-04 DIAGNOSIS — G8929 Other chronic pain: Secondary | ICD-10-CM | POA: Diagnosis not present

## 2017-04-04 DIAGNOSIS — R2689 Other abnormalities of gait and mobility: Secondary | ICD-10-CM | POA: Diagnosis not present

## 2017-04-04 DIAGNOSIS — M25612 Stiffness of left shoulder, not elsewhere classified: Secondary | ICD-10-CM

## 2017-04-04 DIAGNOSIS — M545 Low back pain, unspecified: Secondary | ICD-10-CM

## 2017-04-04 NOTE — Telephone Encounter (Signed)
Please get a message to the drug rep for samples.

## 2017-04-04 NOTE — Telephone Encounter (Signed)
Pt left VM stating he can't afford medications recently prescribed would like to know if there is any alternatives.Please advise.

## 2017-04-04 NOTE — Therapy (Signed)
[place], [place], [address] Phone: [phone]   Fax:  [phone]  Physical Therapy Treatment  Patient Details  Name: Jeff Green MRN: [id number] Date of Birth: [birth year] Referring Provider: Dr Dianah Field   Encounter Date: 04/04/2017  PT End of Session - 04/04/17 0936    Visit Number  2    Number of Visits  12    Date for PT Re-Evaluation  05/08/17    PT Start Time  0936    PT Stop Time  1024    PT Time Calculation (min)  48 min    Activity Tolerance  Patient limited by pain       Past Medical History:  Diagnosis Date  . Atrial fibrillation ([place])   . CAD (coronary artery disease)    Stent placed; [place]  . Congestive heart failure (CHF) ([place])   . DVT (deep vein thrombosis) in pregnancy ([place])   . High cholesterol   . Hypertension   . Pulmonary embolus [facility])     Past Surgical History:  Procedure Laterality Date  . HERNIA REPAIR    . IVC  2014  . lamenectomy    . left orchiectomy      There were no vitals filed for this visit.  Subjective Assessment - 04/04/17 0936    Subjective  Jeff Green reports he is very sore in the Lt shoulder and low back after shovelling and falling on Tuesday.     Patient Stated Goals  increase shoulder ROM and work on balance.     Currently in Pain?  Yes    Pain Score  5     Pain Location  Shoulder    Pain Orientation  Left    Pain Descriptors / Indicators  Aching    Pain Type  Chronic pain    Pain Onset  More than a month ago    Aggravating Factors   falling this week while shoveling    Pain Relieving Factors  biofreeze    Multiple Pain Sites  Yes    Pain Score  5    Pain Location  Back    Pain Orientation  Right;Left    Pain Descriptors / Indicators  Sore    Pain Onset  In the past 7 days    Pain Frequency  Constant    Aggravating Factors   falling and shoveling this week    Pain Relieving Factors  rest                       [facility] Adult PT Treatment/Exercise - 04/04/17 0001      Shoulder Exercises: Supine   Horizontal ABduction  Strengthening;Both;Theraband;20 reps in painfree ROM, with hip abduction blue band    Flexion  Strengthening;Both;20 reps;Theraband overhead  with bridging    Theraband Level (Shoulder Flexion)  Level 1 (Yellow)    Other Supine Exercises  isometric hip flex in table top alternating legs    Other Supine Exercises  rythmic stabilization with 3# CW/CCW small circles Lt UE thoracic lifts 10 x 5 sec hold      Shoulder Exercises: Body Blade   Flexion  30 seconds;5 reps in hooklying      Modalities   Modalities  Electrical Stimulation;Moist Heat      Moist Heat Therapy   Number Minutes Moist Heat  15 Minutes    Moist Heat Location  Shoulder;Lumbar Spine      Electrical Stimulation  Electrical Stimulation Location  Lt shoulder and low back    Electrical Stimulation Action  premod    Electrical Stimulation Parameters  to tolerance    Electrical Stimulation Goals  Pain                  PT Long Term Goals - 04/04/17 0940      PT LONG TERM GOAL #1   Title  I with HEP for shoulder and balance ( 05/08/17)     Status  On-going      PT LONG TERM GOAL #2   Title  improve DGI =/> 22/24 to decrease risk of falls ( 05/08/17)     Status  On-going      PT LONG TERM GOAL #3   Title  demo improved Lt shoulder motion to allow him to consistently reach to the top shelf (05/08/17)      Status  On-going      PT LONG TERM GOAL #4   Title  report =/> 50% reduction of Lt shoulder pain ( 05/08/17)     Status  On-going      PT LONG TERM GOAL #5   Title  Improve FOTO to </= 38% limitation (05/08/17)     Status  On-going            Plan - 04/04/17 0940    Clinical Impression Statement  This is Jeff Green's second visit.  He has a slight decline after falling and shoveling two days ago. No goals met yet.  Responded well to gentle ex and modalities.      Rehab Potential  Good    PT Frequency  2x / week    PT Duration  6 weeks    PT Treatment/Interventions  Gait training;Neuromuscular re-education;Dry needling;Manual techniques;Moist Heat;Ultrasound;Therapeutic activities;Patient/family education;Therapeutic exercise;Cryotherapy;Electrical Stimulation;Passive range of motion    PT Next Visit Plan  pt will come next week then will be out of town for Christmas. Will be back after the New Year    Consulted and Agree with Plan of Care  Patient       Patient will benefit from skilled therapeutic intervention in order to improve the following deficits and impairments:  Abnormal gait, Pain, Increased muscle spasms, Impaired UE functional use, Decreased strength, Decreased range of motion, Decreased balance, Difficulty walking  Visit Diagnosis: Chronic left shoulder pain  Muscle weakness (generalized)  Stiffness of left shoulder, not elsewhere classified  Chronic bilateral low back pain without sciatica  Other abnormalities of gait and mobility     Problem List Patient Active Problem List   Diagnosis Date Noted  . Excessive daytime sleepiness 03/21/2017  . Primary osteoarthritis of left shoulder 11/28/2016  . Diabetes mellitus type 2, diet-controlled ([place]) 06/08/2016  . Seborrheic dermatitis 06/08/2016  . Morbid obesity ([place]) 06/08/2016  . Ruptured tympanic membrane, bilateral 01/27/2016  . Annual physical exam 01/25/2016  . Paroxysmal atrial fibrillation ([place]) 06/15/2015  . Polycythemia, secondary 08/26/2014  . Hemochromatosis 08/26/2014  . Multiple pulmonary nodules 08/16/2014  . Testicular cancer ([place]) 08/12/2014  . Hyperlipidemia 08/10/2014  . Hypogonadism in male 08/10/2014  . Essential hypertension 08/10/2014  . Spinal stenosis of lumbar region 08/10/2014  . Recurrent pulmonary emboli ([place]) 08/10/2014  . CAD (coronary artery disease), native coronary artery 08/10/2014    Manuela Schwartz Graciana Sessa PT  04/04/2017, 10:11 AM  [facility] [place], [place], [address] Phone: [phone]   Fax:  [phone]  Name: Jeff Green MRN: [id number]  Date of Birth: Sep 14, 1955

## 2017-04-05 ENCOUNTER — Encounter: Payer: Self-pay | Admitting: Sports Medicine

## 2017-04-05 ENCOUNTER — Ambulatory Visit (INDEPENDENT_AMBULATORY_CARE_PROVIDER_SITE_OTHER): Payer: Medicare Other | Admitting: Sports Medicine

## 2017-04-05 DIAGNOSIS — E785 Hyperlipidemia, unspecified: Secondary | ICD-10-CM | POA: Diagnosis not present

## 2017-04-05 DIAGNOSIS — F5101 Primary insomnia: Secondary | ICD-10-CM

## 2017-04-05 DIAGNOSIS — G4700 Insomnia, unspecified: Secondary | ICD-10-CM | POA: Insufficient documentation

## 2017-04-05 DIAGNOSIS — E119 Type 2 diabetes mellitus without complications: Secondary | ICD-10-CM

## 2017-04-05 MED ORDER — DULAGLUTIDE 1.5 MG/0.5ML ~~LOC~~ SOAJ: 1.5000 mg | SUBCUTANEOUS | 11 refills | Status: DC

## 2017-04-05 MED ORDER — FENOFIBRATE 160 MG PO TABS: 160.0000 mg | ORAL_TABLET | Freq: Every day | ORAL | 3 refills | Status: DC

## 2017-04-05 MED ORDER — PHENTERMINE HCL 37.5 MG PO TABS: ORAL_TABLET | ORAL | 0 refills | Status: DC

## 2017-04-05 MED ORDER — SUVOREXANT 20 MG PO TABS: 1.0000 | ORAL_TABLET | Freq: Every day | ORAL | 0 refills | Status: DC

## 2017-04-05 NOTE — Assessment & Plan Note (Signed)
Switching to Trulicity. Ozempic was not covered.

## 2017-04-05 NOTE — Assessment & Plan Note (Signed)
Increasing Belsomra to 20 mg, he can get another free 10-day trial.

## 2017-04-05 NOTE — Assessment & Plan Note (Signed)
Triglycerides were elevated, continue statin but for some reason fenofibrate was excessively expensive. I am going to give him a written prescription for this, and he can go to another pharmacy to find it cheaper.

## 2017-04-05 NOTE — Assessment & Plan Note (Signed)
Back to doing full dose phentermine, awaiting bariatric surgery referral, he has not heard back from [place] [facility] so I am going to print out the referral for him to call. Refilling phentermine as we enter the second month.

## 2017-04-05 NOTE — Progress Notes (Signed)
  Subjective:    CC: Several issues  HPI: Diabetes mellitus type 2: Was unable to afford Ozempic, would like to try a different GLP-1 agonist.  Hypertriglyceridemia: Doing well on statin, triglycerides were still elevated so we added fenofibrate, for some reason generic fenofibrate was over $100.  Morbid obesity: Back on phentermine, would like a refill, really didn't lose all lung weight over the past month, he is also being set up for bariatric surgery.  Insomnia: Okay with 10 mg of Belsomra, would like to go up on the dose. Also had a sleep study.  Past medical history:  Negative.  See flowsheet/record as well for more information.  Surgical history: Negative.  See flowsheet/record as well for more information.  Family history: Negative.  See flowsheet/record as well for more information.  Social history: Negative.  See flowsheet/record as well for more information.  Allergies, and medications have been entered into the medical record, reviewed, and no changes needed.   (To billers/coders, pertinent past medical, social, surgical, family history can be found in problem list, if problem list is marked as reviewed then this indicates that past medical, social, surgical, family history was also reviewed)  Review of Systems: No fevers, chills, night sweats, weight loss, chest pain, or shortness of breath.   Objective:    General: Well Developed, well nourished, and in no acute distress.  Neuro: Alert and oriented x3, extra-ocular muscles intact, sensation grossly intact.  HEENT: Normocephalic, atraumatic, pupils equal round reactive to light, neck supple, no masses, no lymphadenopathy, thyroid nonpalpable.  Skin: Warm and dry, no rashes. Cardiac: Regular rate and rhythm, no murmurs rubs or gallops, no lower extremity edema.  Respiratory: Clear to auscultation bilaterally. Not using accessory muscles, speaking in full sentences.  Impression and Recommendations:    Diabetes mellitus  type 2, diet-controlled (HCC) Switching to Trulicity. Ozempic was not covered.  Hyperlipidemia Triglycerides were elevated, continue statin but for some reason fenofibrate was excessively expensive. I am going to give him a written prescription for this, and he can go to another pharmacy to find it cheaper.  Morbid obesity ([place]) Back to doing full dose phentermine, awaiting bariatric surgery referral, he has not heard back from [place] [facility] so I am going to print out the referral for him to call. Refilling phentermine as we enter the second month.  Insomnia Increasing Belsomra to 20 mg, he can get another free 10-day trial.  ___________________________________________ Gwen Her. Dianah Field, M.D., ABFM., CAQSM. Primary Care and [place] Instructor of [place] [employer] [facility]

## 2017-04-05 NOTE — Telephone Encounter (Signed)
Discussed during appointment.

## 2017-04-08 DIAGNOSIS — M25519 Pain in unspecified shoulder: Secondary | ICD-10-CM | POA: Diagnosis not present

## 2017-04-08 DIAGNOSIS — M25561 Pain in right knee: Secondary | ICD-10-CM | POA: Diagnosis not present

## 2017-04-08 DIAGNOSIS — M961 Postlaminectomy syndrome, not elsewhere classified: Secondary | ICD-10-CM | POA: Diagnosis not present

## 2017-04-08 DIAGNOSIS — Z79899 Other long term (current) drug therapy: Secondary | ICD-10-CM | POA: Diagnosis not present

## 2017-04-08 DIAGNOSIS — G894 Chronic pain syndrome: Secondary | ICD-10-CM | POA: Diagnosis not present

## 2017-04-08 DIAGNOSIS — Z79891 Long term (current) use of opiate analgesic: Secondary | ICD-10-CM | POA: Diagnosis not present

## 2017-04-09 ENCOUNTER — Encounter: Payer: Self-pay | Admitting: Physical Therapy

## 2017-04-10 ENCOUNTER — Other Ambulatory Visit: Payer: Self-pay

## 2017-04-10 ENCOUNTER — Telehealth: Payer: Self-pay | Admitting: Sports Medicine

## 2017-04-10 MED ORDER — METHOCARBAMOL 500 MG PO TABS: 750.0000 mg | ORAL_TABLET | Freq: Three times a day (TID) | ORAL | 3 refills | Status: DC

## 2017-04-10 NOTE — Telephone Encounter (Signed)
Pt calls and asks for refill of Robaxin. He is leaving for [employer].

## 2017-04-11 NOTE — Telephone Encounter (Signed)
Refill sent.

## 2017-04-19 ENCOUNTER — Telehealth: Payer: Self-pay | Admitting: Family Medicine

## 2017-04-19 ENCOUNTER — Encounter: Payer: Self-pay | Admitting: Family Medicine

## 2017-04-19 DIAGNOSIS — G4733 Obstructive sleep apnea (adult) (pediatric): Secondary | ICD-10-CM

## 2017-04-19 MED ORDER — AMBULATORY NON FORMULARY MEDICATION: 0 refills | Status: DC

## 2017-04-19 NOTE — Telephone Encounter (Signed)
Sleep study shows sleep apnea. This will be best treated with CPAP. The reason to treat this condition is to prevent heart failure and death. Treating Sleep apnea will also make you feel less tired and better rested.  Let me know if you do not hear about the CPAP company.

## 2017-04-22 ENCOUNTER — Encounter: Payer: Medicare Other | Admitting: Physical Therapy

## 2017-04-24 ENCOUNTER — Encounter: Payer: Self-pay | Admitting: Physical Therapy

## 2017-04-24 ENCOUNTER — Ambulatory Visit (INDEPENDENT_AMBULATORY_CARE_PROVIDER_SITE_OTHER): Payer: Medicare Other | Admitting: Physical Therapy

## 2017-04-24 DIAGNOSIS — M25512 Pain in left shoulder: Secondary | ICD-10-CM | POA: Diagnosis present

## 2017-04-24 DIAGNOSIS — G8929 Other chronic pain: Secondary | ICD-10-CM | POA: Diagnosis not present

## 2017-04-24 DIAGNOSIS — M6281 Muscle weakness (generalized): Secondary | ICD-10-CM

## 2017-04-24 DIAGNOSIS — M25612 Stiffness of left shoulder, not elsewhere classified: Secondary | ICD-10-CM | POA: Diagnosis not present

## 2017-04-24 NOTE — Therapy (Signed)
[place], [place], [address] Phone: [phone]   Fax:  [phone]  Physical Therapy Treatment  Patient Details  Name: Jeff Green MRN: [id number] Date of Birth: Jan 14, 1956 Referring Provider: Dr Dianah Field   Encounter Date: 04/24/2017  PT End of Session - 04/24/17 1104    Visit Number  3    Number of Visits  12    Date for PT Re-Evaluation  05/08/17    PT Start Time  1104    PT Stop Time  1159    PT Time Calculation (min)  55 min    Activity Tolerance  Patient tolerated treatment well       Past Medical History:  Diagnosis Date  . Atrial fibrillation ([place])   . CAD (coronary artery disease)    Stent placed; [place]  . Congestive heart failure (CHF) ([place])   . DVT (deep vein thrombosis) in pregnancy ([place])   . High cholesterol   . Hypertension   . OSA (obstructive sleep apnea) 03/21/2017  . Pulmonary embolus [facility])     Past Surgical History:  Procedure Laterality Date  . HERNIA REPAIR    . IVC  2014  . lamenectomy    . left orchiectomy      There were no vitals filed for this visit.  Subjective Assessment - 04/24/17 1107    Subjective  Rich got back into town this past weekend, did ok with traveling besides fatigue. LT shoulder is having good and bad days, overall he feels like its getting better.     Patient Stated Goals  increase shoulder ROM and work on balance.     Currently in Pain?  No/denies                      [facility] Adult PT Treatment/Exercise - 04/24/17 0001      Shoulder Exercises: Supine   Other Supine Exercises  bridge with hip abduct - black band with 5x5 reps lat pull downs holding 5# wts.       Shoulder Exercises: Seated   Elevation  Strengthening;Both;Weights 3x10 full can    Elevation Weight (lbs)  2    Other Seated Exercises  marching while sitting on blue foam pad, followed by BWD and side leans with upright posture.        Shoulder Exercises: Sidelying   Other Sidelying Exercises  3x10 ER with 5#, towel under elbow and clams with black band around knees.       Shoulder Exercises: Standing   External Rotation  Strengthening;Both;Theraband 30 reps with scapular retraction    Theraband Level (Shoulder External Rotation)  Level 2 (Red)    Flexion  Strengthening;Both;Theraband 30 reps    Theraband Level (Shoulder Flexion)  Level 2 (Red)    Other Standing Exercises  modified plank on counter with side "walking"       Modalities   Modalities  Electrical Stimulation      Electrical Stimulation   Electrical Stimulation Location  Lt shoulder    Electrical Stimulation Action  IFC     Electrical Stimulation Parameters   to tolerance    Electrical Stimulation Goals  Pain tightness                  PT Long Term Goals - 04/24/17 1109      PT LONG TERM GOAL #1   Title  I with HEP for shoulder and balance ( 05/08/17)  Status  On-going      PT LONG TERM GOAL #2   Title  improve DGI =/> 22/24 to decrease risk of falls ( 05/08/17)     Status  On-going      PT LONG TERM GOAL #3   Title  demo improved Lt shoulder motion to allow him to consistently reach to the top shelf (05/08/17)      Status  Achieved      PT LONG TERM GOAL #4   Title  report =/> 50% reduction of Lt shoulder pain ( 05/08/17)     Status  -- reports 25-40% better      PT LONG TERM GOAL #5   Title  Improve FOTO to </= 38% limitation (05/08/17)     Status  On-going            Plan - 04/24/17 1133    Clinical Impression Statement  Jeff Green is making progress, partially met some goals.  He is tolerating his exercise well and slowly returning to functional tasks with the LUE.  He still has some weakness and functional limitations in the UE and his low back is intermittent pain.     Rehab Potential  Good    PT Frequency  2x / week    PT Duration  6 weeks    PT Treatment/Interventions  Gait training;Neuromuscular re-education;Dry  needling;Manual techniques;Moist Heat;Ultrasound;Therapeutic activities;Patient/family education;Therapeutic exercise;Cryotherapy;Electrical Stimulation;Passive range of motion    PT Next Visit Plan  start focusing more on balance and progress strengthening and work into functional activities.     Consulted and Agree with Plan of Care  Patient       Patient will benefit from skilled therapeutic intervention in order to improve the following deficits and impairments:  Abnormal gait, Pain, Increased muscle spasms, Impaired UE functional use, Decreased strength, Decreased range of motion, Decreased balance, Difficulty walking  Visit Diagnosis: Chronic left shoulder pain  Muscle weakness (generalized)  Stiffness of left shoulder, not elsewhere classified     Problem List Patient Active Problem List   Diagnosis Date Noted  . Insomnia 04/05/2017  . OSA (obstructive sleep apnea) 03/21/2017  . Primary osteoarthritis of left shoulder 11/28/2016  . Diabetes mellitus type 2, diet-controlled ([place]) 06/08/2016  . Seborrheic dermatitis 06/08/2016  . Morbid obesity ([place]) 06/08/2016  . Ruptured tympanic membrane, bilateral 01/27/2016  . Annual physical exam 01/25/2016  . Paroxysmal atrial fibrillation ([place]) 06/15/2015  . Polycythemia, secondary 08/26/2014  . Hemochromatosis 08/26/2014  . Multiple pulmonary nodules 08/16/2014  . Testicular cancer ([place]) 08/12/2014  . Hyperlipidemia 08/10/2014  . Hypogonadism in male 08/10/2014  . Essential hypertension 08/10/2014  . Spinal stenosis of lumbar region 08/10/2014  . Recurrent pulmonary emboli ([place]) 08/10/2014  . CAD (coronary artery disease), native coronary artery 08/10/2014    [name] PT  04/24/2017, 11:47 AM  [facility] [place], [place], [address] Phone: [phone]   Fax:  [phone]  Name: Jeff Green MRN: [id number] Date of Birth: Mar 23, 1956

## 2017-04-24 NOTE — Telephone Encounter (Signed)
Order faxed to [place] on 04/24/2017.Genny Caulder,CMA

## 2017-04-25 ENCOUNTER — Encounter: Payer: Medicare Other | Admitting: Physical Therapy

## 2017-04-25 NOTE — Telephone Encounter (Signed)
Patient advised.

## 2017-04-26 ENCOUNTER — Encounter: Payer: Self-pay | Admitting: Physical Therapy

## 2017-04-26 ENCOUNTER — Ambulatory Visit (INDEPENDENT_AMBULATORY_CARE_PROVIDER_SITE_OTHER): Payer: Medicare Other | Admitting: Physical Therapy

## 2017-04-26 DIAGNOSIS — M25512 Pain in left shoulder: Secondary | ICD-10-CM | POA: Diagnosis present

## 2017-04-26 DIAGNOSIS — G8929 Other chronic pain: Secondary | ICD-10-CM | POA: Diagnosis not present

## 2017-04-26 DIAGNOSIS — M6281 Muscle weakness (generalized): Secondary | ICD-10-CM | POA: Diagnosis not present

## 2017-04-26 DIAGNOSIS — M25612 Stiffness of left shoulder, not elsewhere classified: Secondary | ICD-10-CM

## 2017-04-26 NOTE — Therapy (Signed)
[place], [place], [address] Phone: [phone]   Fax:  [phone]  Physical Therapy Treatment  Patient Details  Name: Jeff Green MRN: [id number] Date of Birth: 07-14-1955 Referring Provider: Dr Dianah Field   Encounter Date: 04/26/2017  PT End of Session - 04/26/17 1352    Visit Number  4    Number of Visits  12    Date for PT Re-Evaluation  05/08/17    PT Start Time  3235    PT Stop Time  1441    PT Time Calculation (min)  49 min    Activity Tolerance  Patient limited by fatigue       Past Medical History:  Diagnosis Date  . Atrial fibrillation ([place])   . CAD (coronary artery disease)    Stent placed; [place]  . Congestive heart failure (CHF) ([place])   . DVT (deep vein thrombosis) in pregnancy ([place])   . High cholesterol   . Hypertension   . OSA (obstructive sleep apnea) 03/21/2017  . Pulmonary embolus [facility])     Past Surgical History:  Procedure Laterality Date  . HERNIA REPAIR    . IVC  2014  . lamenectomy    . left orchiectomy      There were no vitals filed for this visit.  Subjective Assessment - 04/26/17 1353    Subjective  Jeff Green was a little sore the following morning and it settled down with some biofreeze type spray.   Ready to work on balance more now    Patient Stated Goals  increase shoulder ROM and work on balance.     Currently in Pain?  No/denies         [facility] PT Assessment - 04/26/17 0001      ROM / Strength   AROM / PROM / Strength  Strength      Strength   Overall Strength Comments  Rt ankle eversion 3/5                   [facility] Adult PT Treatment/Exercise - 04/26/17 0001      Exercises   Exercises  Lumbar      Lumbar Exercises: Stretches   Active Hamstring Stretch Limitations  gastroc stretch bilat in standing x 60 sec VC for form      Lumbar Exercises: Seated   Other Seated Lumbar Exercises  Rt ankle eversion with  yellow band to fatigue, attempted other color bands - unable to turn foot.       Lumbar Exercises: Supine   Bridge  15 reps figure 4 lifts, VC for form, Rt foot rolling out      Shoulder Exercises: ROM/Strengthening   UBE (Upper Arm Bike)  L1x4' alt FWD/BWD, standing on a 2" foam pad           Balance Exercises - 04/26/17 1414      Balance Exercises: Standing   SLS  -- 30 reps sit to stand using mat below glut fold    Standing, One Foot on a Step  2 inch;Head turns    Lift / Chop Limitations  3x10 each side with black band the static holds at mid range.     Other Standing Exercises  standing facing wall, opposite arm/leg lifts away from wall, required UE assist to maintain balance             PT Long Term Goals - 04/24/17 1109  PT LONG TERM GOAL #1   Title  I with HEP for shoulder and balance ( 05/08/17)     Status  On-going      PT LONG TERM GOAL #2   Title  improve DGI =/> 22/24 to decrease risk of falls ( 05/08/17)     Status  On-going      PT LONG TERM GOAL #3   Title  demo improved Lt shoulder motion to allow him to consistently reach to the top shelf (05/08/17)      Status  Achieved      PT LONG TERM GOAL #4   Title  report =/> 50% reduction of Lt shoulder pain ( 05/08/17)     Status  -- reports 25-40% better      PT LONG TERM GOAL #5   Title  Improve FOTO to </= 38% limitation (05/08/17)     Status  On-going            Plan - 04/26/17 1421    Clinical Impression Statement  Jeff Green is doing well with his shoulder rehab, todays focus was more on core and balance work.  His proprioception system is impaired/delayed. Was veryy challeneged when he can't rely on his vision.  Jeff Green also had a lot of instability in his Rt ankle, eversion strength is 3/5    Rehab Potential  Good    PT Frequency  2x / week    PT Duration  6 weeks    PT Treatment/Interventions  Gait training;Neuromuscular re-education;Dry needling;Manual techniques;Moist  Heat;Ultrasound;Therapeutic activities;Patient/family education;Therapeutic exercise;Cryotherapy;Electrical Stimulation;Passive range of motion    PT Next Visit Plan  ankle strength, standing balance and core    Consulted and Agree with Plan of Care  Patient       Patient will benefit from skilled therapeutic intervention in order to improve the following deficits and impairments:  Abnormal gait, Pain, Increased muscle spasms, Impaired UE functional use, Decreased strength, Decreased range of motion, Decreased balance, Difficulty walking  Visit Diagnosis: Chronic left shoulder pain  Muscle weakness (generalized)  Stiffness of left shoulder, not elsewhere classified     Problem List Patient Active Problem List   Diagnosis Date Noted  . Insomnia 04/05/2017  . OSA (obstructive sleep apnea) 03/21/2017  . Primary osteoarthritis of left shoulder 11/28/2016  . Diabetes mellitus type 2, diet-controlled ([place]) 06/08/2016  . Seborrheic dermatitis 06/08/2016  . Morbid obesity ([place]) 06/08/2016  . Ruptured tympanic membrane, bilateral 01/27/2016  . Annual physical exam 01/25/2016  . Paroxysmal atrial fibrillation ([place]) 06/15/2015  . Polycythemia, secondary 08/26/2014  . Hemochromatosis 08/26/2014  . Multiple pulmonary nodules 08/16/2014  . Testicular cancer ([place]) 08/12/2014  . Hyperlipidemia 08/10/2014  . Hypogonadism in male 08/10/2014  . Essential hypertension 08/10/2014  . Spinal stenosis of lumbar region 08/10/2014  . Recurrent pulmonary emboli ([place]) 08/10/2014  . CAD (coronary artery disease), native coronary artery 08/10/2014    Jeff Green PT  04/26/2017, 2:43 PM  [facility] [place], [place], [address] Phone: [phone]   Fax:  [phone]  Name: Jeff Green MRN: [id number] Date of Birth: 1955/08/23

## 2017-05-01 ENCOUNTER — Ambulatory Visit (INDEPENDENT_AMBULATORY_CARE_PROVIDER_SITE_OTHER): Payer: Medicare Other | Admitting: Sports Medicine

## 2017-05-01 ENCOUNTER — Encounter: Payer: Self-pay | Admitting: Sports Medicine

## 2017-05-01 DIAGNOSIS — M48061 Spinal stenosis, lumbar region without neurogenic claudication: Secondary | ICD-10-CM | POA: Diagnosis not present

## 2017-05-01 DIAGNOSIS — G4733 Obstructive sleep apnea (adult) (pediatric): Secondary | ICD-10-CM

## 2017-05-01 DIAGNOSIS — E119 Type 2 diabetes mellitus without complications: Secondary | ICD-10-CM

## 2017-05-01 DIAGNOSIS — M19012 Primary osteoarthritis, left shoulder: Secondary | ICD-10-CM | POA: Diagnosis not present

## 2017-05-01 DIAGNOSIS — F5101 Primary insomnia: Secondary | ICD-10-CM | POA: Diagnosis not present

## 2017-05-01 MED ORDER — PHENTERMINE HCL 37.5 MG PO TABS: ORAL_TABLET | ORAL | 0 refills | Status: DC

## 2017-05-01 NOTE — Assessment & Plan Note (Signed)
Good weight loss after the second month on phentermine, he is currently working towards bariatric surgery. Refilling medication as we enter the third month.

## 2017-05-01 NOTE — Assessment & Plan Note (Signed)
Seems to had a good response with simply using NyQuil.

## 2017-05-01 NOTE — Assessment & Plan Note (Signed)
Doing well with Trulicity.

## 2017-05-01 NOTE — Assessment & Plan Note (Signed)
Positive sleep study with AHI of 23, patient denies that he has sleep apnea, risks of untreated sleep apnea explained, no further intervention will be performed.

## 2017-05-01 NOTE — Progress Notes (Signed)
Subjective:    CC: Weight check  HPI: Obesity: Good weight loss after the second month on phentermine, he is proceeding with bariatric surgery as well.  Diabetes mellitus type 2: Stable on Trulicity.  Insomnia: We have tried Belsomra, he went back to NyQuil and this seemed to work well.  I reviewed the past medical history, family history, social history, surgical history, and allergies today and no changes were needed.  Please see the problem list section below in epic for further details.  Past Medical History: Past Medical History:  Diagnosis Date  . Atrial fibrillation ([place])   . CAD (coronary artery disease)    Stent placed; [place]  . Congestive heart failure (CHF) ([place])   . DVT (deep vein thrombosis) in pregnancy ([place])   . High cholesterol   . Hypertension   . OSA (obstructive sleep apnea) 03/21/2017  . Pulmonary embolus [facility])    Past Surgical History: Past Surgical History:  Procedure Laterality Date  . HERNIA REPAIR    . IVC  2014  . lamenectomy    . left orchiectomy     Social History: Social History   Socioeconomic History  . Marital status: Married    Spouse name: None  . Number of children: 5  . Years of education: None  . Highest education level: None  Social Needs  . Financial resource strain: None  . Food insecurity - worry: None  . Food insecurity - inability: None  . Transportation needs - medical: None  . Transportation needs - non-medical: None  Occupational History  . None  Tobacco Use  . Smoking status: Former Smoker    Last attempt to quit: 08/10/1999    Years since quitting: 17.7  . Smokeless tobacco: Never Used  Substance and Sexual Activity  . Alcohol use: Yes    Alcohol/week: 0.0 oz    Comment: Rare  . Drug use: No  . Sexual activity: Yes    Partners: Female  Other Topics Concern  . None  Social History Narrative  . None   Family History: Family History  Problem Relation Age of Onset  . Thyroid cancer  Mother   . Depression Mother   . High Cholesterol Father   . High blood pressure Father   . Heart disease Father        Atrial fibrillation  . Stroke Unknown        Grandfather   Allergies: Allergies  Allergen Reactions  . Lipitor [Atorvastatin]     myalgias   Medications: See med rec.  Review of Systems: No fevers, chills, night sweats, weight loss, chest pain, or shortness of breath.   Objective:    General: Well Developed, well nourished, and in no acute distress.  Neuro: Alert and oriented x3, extra-ocular muscles intact, sensation grossly intact.  HEENT: Normocephalic, atraumatic, pupils equal round reactive to light, neck supple, no masses, no lymphadenopathy, thyroid nonpalpable.  Skin: Warm and dry, no rashes. Cardiac: Regular rate and rhythm, no murmurs rubs or gallops, no lower extremity edema.  Respiratory: Clear to auscultation bilaterally. Not using accessory muscles, speaking in full sentences.   Impression and Recommendations:    Diabetes mellitus type 2, diet-controlled ([place]) Doing well with Trulicity.  Insomnia Seems to had a good response with simply using NyQuil.  OSA (obstructive sleep apnea) Positive sleep study with AHI of 23, patient denies that he has sleep apnea, risks of untreated sleep apnea explained, no further intervention will be performed.  Primary osteoarthritis  of left shoulder Injection was about a month and a half ago, doing okay, he is working well with physical therapy. No further injections for now.  Morbid obesity ([place]) Good weight loss after the second month on phentermine, he is currently working towards bariatric surgery. Refilling medication as we enter the third month.  Spinal stenosis of lumbar region Did well after a single level decompression, he does have lumbar spinal stenosis.  This is the likely cause of his unsteadiness with gait. He can follow-up with his neurosurgeon regarding this.  I spent 40 minutes with  this patient, greater than 50% was face-to-face time counseling regarding the above diagnoses ___________________________________________ Gwen Her. Dianah Field, M.D., ABFM., CAQSM. Primary Care and [place] Instructor of [place] of [facility] of [facility]

## 2017-05-01 NOTE — Assessment & Plan Note (Signed)
Injection was about a month and a half ago, doing okay, he is working well with physical therapy. No further injections for now.

## 2017-05-01 NOTE — Assessment & Plan Note (Signed)
Did well after a single level decompression, he does have lumbar spinal stenosis.  This is the likely cause of his unsteadiness with gait. He can follow-up with his neurosurgeon regarding this.

## 2017-05-07 ENCOUNTER — Ambulatory Visit (INDEPENDENT_AMBULATORY_CARE_PROVIDER_SITE_OTHER): Payer: Medicare Other | Admitting: Physical Therapy

## 2017-05-07 DIAGNOSIS — M545 Low back pain, unspecified: Secondary | ICD-10-CM

## 2017-05-07 DIAGNOSIS — M25512 Pain in left shoulder: Secondary | ICD-10-CM | POA: Diagnosis present

## 2017-05-07 DIAGNOSIS — M6281 Muscle weakness (generalized): Secondary | ICD-10-CM | POA: Diagnosis not present

## 2017-05-07 DIAGNOSIS — M25612 Stiffness of left shoulder, not elsewhere classified: Secondary | ICD-10-CM

## 2017-05-07 DIAGNOSIS — G8929 Other chronic pain: Secondary | ICD-10-CM | POA: Diagnosis not present

## 2017-05-07 DIAGNOSIS — R2689 Other abnormalities of gait and mobility: Secondary | ICD-10-CM | POA: Diagnosis not present

## 2017-05-07 NOTE — Therapy (Signed)
[place], [place], [address] Phone: [phone]   Fax:  [phone]  Physical Therapy Treatment  Patient Details  Name: Jeff Green MRN: [id number] Date of Birth: 02/11/1956 Referring Provider: Dr Dianah Field   Encounter Date: 05/07/2017  PT End of Session - 05/07/17 1143    Visit Number  5    Number of Visits  12    Date for PT Re-Evaluation  05/08/17    PT Start Time  5956    PT Stop Time  1226    PT Time Calculation (min)  42 min    Activity Tolerance  Patient tolerated treatment well       Past Medical History:  Diagnosis Date  . Atrial fibrillation ([place])   . CAD (coronary artery disease)    Stent placed; [place]  . Congestive heart failure (CHF) ([place])   . DVT (deep vein thrombosis) in pregnancy ([place])   . High cholesterol   . Hypertension   . OSA (obstructive sleep apnea) 03/21/2017  . Pulmonary embolus [facility])     Past Surgical History:  Procedure Laterality Date  . HERNIA REPAIR    . IVC  2014  . lamenectomy    . left orchiectomy      There were no vitals filed for this visit.  Subjective Assessment - 05/07/17 1145    Subjective  [name] said he can get his foot to go out on its own now, not with band.  No pain today however had some low back pain yesterday    Patient Stated Goals  increase shoulder ROM and work on balance.     Currently in Pain?  No/denies                      [facility] Adult PT Treatment/Exercise - 05/07/17 0001      Lumbar Exercises: Standing   Other Standing Lumbar Exercises  BAPS board with UE assist, L1 working on 3/9 oclock - very difficult Rt/Lt LE, FWD step ups on  2" step with opposite leg lift - focus on keeping SLS foot flat, bilat ankle inversion holds on black therapad       Lumbar Exercises: Supine   Bridge  10 reps single leg with small towel roll under outside of foot     Isometric Hip Flexion  10 reps;5  seconds each side    Other Supine Lumbar Exercises  2x5 reps holding single table top with lat pull downs holding 4# wts      Lumbar Exercises: Prone   Other Prone Lumbar Exercises  2x10 upper body lifts, vc for form      Shoulder Exercises: ROM/Strengthening   UBE (Upper Arm Bike)  L1x4' alt FWD/BWD, standing on a 2" foam pad                   PT Long Term Goals - 05/07/17 1146      PT LONG TERM GOAL #1   Title  I with HEP for shoulder and balance ( 05/08/17)     Status  On-going      PT LONG TERM GOAL #2   Title  improve DGI =/> 22/24 to decrease risk of falls ( 05/08/17)     Status  On-going      PT LONG TERM GOAL #3   Title  demo improved Lt shoulder motion to allow him to consistently reach to the top shelf (05/08/17)  Status  Achieved      PT LONG TERM GOAL #4   Title  report =/> 50% reduction of Lt shoulder pain ( 05/08/17)     Status  Achieved      PT LONG TERM GOAL #5   Title  Improve FOTO to </= 38% limitation (05/08/17)     Status  On-going            Plan - 05/07/17 1221    Clinical Impression Statement  [name] is making slow progress, his shoulder continues to do well.  He has intermittent low back pain and LE weakness along with impaired proprioception    Rehab Potential  Good    PT Frequency  2x / week    PT Duration  6 weeks    PT Treatment/Interventions  Gait training;Neuromuscular re-education;Dry needling;Manual techniques;Moist Heat;Ultrasound;Therapeutic activities;Patient/family education;Therapeutic exercise;Cryotherapy;Electrical Stimulation;Passive range of motion    PT Next Visit Plan  write renewal    Consulted and Agree with Plan of Care  Patient       Patient will benefit from skilled therapeutic intervention in order to improve the following deficits and impairments:  Abnormal gait, Pain, Increased muscle spasms, Impaired UE functional use, Decreased strength, Decreased range of motion, Decreased balance, Difficulty  walking  Visit Diagnosis: Chronic left shoulder pain  Muscle weakness (generalized)  Stiffness of left shoulder, not elsewhere classified  Chronic bilateral low back pain without sciatica  Other abnormalities of gait and mobility     Problem List Patient Active Problem List   Diagnosis Date Noted  . Insomnia 04/05/2017  . OSA (obstructive sleep apnea) 03/21/2017  . Primary osteoarthritis of left shoulder 11/28/2016  . Diabetes mellitus type 2, diet-controlled ([place]) 06/08/2016  . Seborrheic dermatitis 06/08/2016  . Morbid obesity ([place]) 06/08/2016  . Ruptured tympanic membrane, bilateral 01/27/2016  . Annual physical exam 01/25/2016  . Paroxysmal atrial fibrillation ([place]) 06/15/2015  . Polycythemia, secondary 08/26/2014  . Hemochromatosis 08/26/2014  . Multiple pulmonary nodules 08/16/2014  . Testicular cancer ([place]) 08/12/2014  . Hyperlipidemia 08/10/2014  . Hypogonadism in male 08/10/2014  . Essential hypertension 08/10/2014  . Spinal stenosis of lumbar region 08/10/2014  . Recurrent pulmonary emboli ([place]) 08/10/2014  . CAD (coronary artery disease), native coronary artery 08/10/2014    [name] PT  05/07/2017, 12:37 PM  [facility] [place], [place], [address] Phone: [phone]   Fax:  [phone]  Name: Jeff Green MRN: [id number] Date of Birth: 16-Jul-1955

## 2017-05-08 ENCOUNTER — Other Ambulatory Visit: Payer: Self-pay | Admitting: Sports Medicine

## 2017-05-09 ENCOUNTER — Ambulatory Visit (INDEPENDENT_AMBULATORY_CARE_PROVIDER_SITE_OTHER): Payer: Medicare Other | Admitting: Physical Therapy

## 2017-05-09 DIAGNOSIS — M545 Low back pain, unspecified: Secondary | ICD-10-CM

## 2017-05-09 DIAGNOSIS — M25512 Pain in left shoulder: Secondary | ICD-10-CM | POA: Diagnosis present

## 2017-05-09 DIAGNOSIS — G8929 Other chronic pain: Secondary | ICD-10-CM

## 2017-05-09 DIAGNOSIS — M25612 Stiffness of left shoulder, not elsewhere classified: Secondary | ICD-10-CM | POA: Diagnosis not present

## 2017-05-09 DIAGNOSIS — M6281 Muscle weakness (generalized): Secondary | ICD-10-CM | POA: Diagnosis not present

## 2017-05-09 DIAGNOSIS — R2689 Other abnormalities of gait and mobility: Secondary | ICD-10-CM

## 2017-05-09 NOTE — Therapy (Signed)
[place], [place], [address] Phone: [phone]   Fax:  [phone]  Physical Therapy Treatment  Patient Details  Name: Jeff Green MRN: [id number] Date of Birth: 1956/02/07 Referring Provider: Dr Dianah Field   Encounter Date: 05/09/2017  PT End of Session - 05/09/17 1145    Visit Number  6    Number of Visits  16    Date for PT Re-Evaluation  06/06/17    PT Start Time  1146    PT Stop Time  1246    PT Time Calculation (min)  60 min    Activity Tolerance  Patient tolerated treatment well       Past Medical History:  Diagnosis Date  . Atrial fibrillation ([place])   . CAD (coronary artery disease)    Stent placed; [place]  . Congestive heart failure (CHF) ([place])   . DVT (deep vein thrombosis) in pregnancy ([place])   . High cholesterol   . Hypertension   . OSA (obstructive sleep apnea) 03/21/2017  . Pulmonary embolus [facility])     Past Surgical History:  Procedure Laterality Date  . HERNIA REPAIR    . IVC  2014  . lamenectomy    . left orchiectomy      There were no vitals filed for this visit.  Subjective Assessment - 05/09/17 1149    Subjective  Jeff Green woke up this morning and can't lift his Lt arm again, can't think of anything he did to cause it to return, half way through treatment he remembered he carried all his groceries in the LT UE.     Patient Stated Goals  increase shoulder ROM and work on balance.     Currently in Pain?  Yes    Pain Score  8     Pain Location  Shoulder    Pain Orientation  Left    Pain Descriptors / Indicators  Sharp    Pain Type  Chronic pain    Pain Onset  More than a month ago    Pain Frequency  Intermittent    Aggravating Factors   lifting the arm up this AM    Pain Relieving Factors  biofreeze         [facility] PT Assessment - 05/09/17 0001      Assessment   Medical Diagnosis  OA Lt shoulder, lumbar spondylosis and gait instability     Referring Provider  Dr Dianah Field    Onset Date/Surgical Date  11/25/16    Hand Dominance  Right    Next MD Visit  06/01/17      Observation/Other Assessments   Focus on Therapeutic Outcomes (FOTO)   45% limited as of today, earlier this week he was having no trouble with the UE at his last visit.       PROM   PROM Assessment Site  Ankle    Right/Left Ankle  -- standing rt 16, Lt 11 in standing      Strength   Overall Strength Comments  Rt ankle eversion 3+/5    Right/Left Shoulder  -- Lt shoulder ER had sharp pain today with testing, was fine      Standardized Balance Assessment   Standardized Balance Assessment  Dynamic Gait Index      Dynamic Gait Index   Level Surface  Mild Impairment    Change in Gait Speed  Moderate Impairment    Gait with Horizontal Head Turns  Normal  Gait with Vertical Head Turns  Normal    Gait and Pivot Turn  Normal    Step Over Obstacle  Mild Impairment    Step Around Obstacles  Normal    Steps  Mild Impairment    Total Score  19                  [facility] Adult PT Treatment/Exercise - 05/09/17 0001      Neuro Re-ed    Neuro Re-ed Details   BWD walking with VC for form and toe heel walking - very difficult , 20 reps standing bilat LE pronation, on upside down  BOSU ball VC for form and keeping feet in neutral positions       Lumbar Exercises: Aerobic   UBE (Upper Arm Bike)  L2x6' alt FWD BWD standing on 2" foam      Shoulder Exercises: Pulleys   Flexion  3 minutes      Shoulder Exercises: Isometric Strengthening   Flexion  -- 10x10", VC to ease up on pressure     Extension  -- 10x10"      Modalities   Modalities  Electrical Stimulation      Moist Heat Therapy   Number Minutes Moist Heat  15 Minutes    Moist Heat Location  Shoulder;Lumbar Spine      Electrical Stimulation   Electrical Stimulation Location  Lt shoulder and lumbar    Electrical Stimulation Action  premod    Electrical Stimulation Parameters  to tolerance     Electrical Stimulation Goals  Pain                  PT Long Term Goals - 05/09/17 1213      PT LONG TERM GOAL #1   Title  I with HEP for shoulder and balance ( 06/07/17)     Time  4    Period  Weeks    Status  On-going      PT LONG TERM GOAL #2   Title  improve DGI =/> 22/24 to decrease risk of falls ( 06/07/17)     Time  4    Period  Weeks    Status  On-going 19/20, one point higher      PT LONG TERM GOAL #3   Title  demo improved Lt shoulder motion to allow him to consistently reach to the top shelf (06/07/17)      Time  4    Period  Weeks    Status  -- not met as of today, had met on Tuesday of this week      PT LONG TERM GOAL #4   Title  report =/> 50% reduction of Lt shoulder pain ( 06/07/17)     Time  4    Status  -- was met ealier this week, not today      PT LONG TERM GOAL #5   Title  Improve FOTO to </= 38% limitation (06/07/17)     Time  4    Status  On-going scored 45% lmited today, was better on Tuesday            Plan - 05/09/17 1217    Clinical Impression Statement  Jeff Green had made great progress with Lt shoulder rehab up until this morning.  He was able to recall that he carried all his groceries in the LT UE yesterday and this may have been the cause of the change in UE symptoms.  He had slight  improvement in his balance score. He would benefit from continued treatment to restore function in Lt UE and improve balance.  He has not been able attend therapy as often as inital POC due to weather and holidays.     Rehab Potential  Good    PT Frequency  2x / week    PT Duration  4 weeks    PT Treatment/Interventions  Gait training;Neuromuscular re-education;Dry needling;Manual techniques;Moist Heat;Ultrasound;Therapeutic activities;Patient/family education;Therapeutic exercise;Cryotherapy;Electrical Stimulation;Passive range of motion    PT Next Visit Plan  re-assess shoulder and continue balance work and ankle strengthenign    Consulted and Agree with  Plan of Care  Patient       Patient will benefit from skilled therapeutic intervention in order to improve the following deficits and impairments:  Abnormal gait, Pain, Increased muscle spasms, Impaired UE functional use, Decreased strength, Decreased range of motion, Decreased balance, Difficulty walking  Visit Diagnosis: Chronic left shoulder pain - Plan: PT plan of care cert/re-cert  Muscle weakness (generalized) - Plan: PT plan of care cert/re-cert  Stiffness of left shoulder, not elsewhere classified - Plan: PT plan of care cert/re-cert  Chronic bilateral low back pain without sciatica - Plan: PT plan of care cert/re-cert  Other abnormalities of gait and mobility - Plan: PT plan of care cert/re-cert     Problem List Patient Active Problem List   Diagnosis Date Noted  . Insomnia 04/05/2017  . OSA (obstructive sleep apnea) 03/21/2017  . Primary osteoarthritis of left shoulder 11/28/2016  . Diabetes mellitus type 2, diet-controlled ([place]) 06/08/2016  . Seborrheic dermatitis 06/08/2016  . Morbid obesity ([place]) 06/08/2016  . Ruptured tympanic membrane, bilateral 01/27/2016  . Annual physical exam 01/25/2016  . Paroxysmal atrial fibrillation ([place]) 06/15/2015  . Polycythemia, secondary 08/26/2014  . Hemochromatosis 08/26/2014  . Multiple pulmonary nodules 08/16/2014  . Testicular cancer ([place]) 08/12/2014  . Hyperlipidemia 08/10/2014  . Hypogonadism in male 08/10/2014  . Essential hypertension 08/10/2014  . Spinal stenosis of lumbar region 08/10/2014  . Recurrent pulmonary emboli ([place]) 08/10/2014  . CAD (coronary artery disease), native coronary artery 08/10/2014    [name] PT  05/09/2017, 12:36 PM  [facility] [place], [place], [address] Phone: [phone]   Fax:  [phone]  Name: Jeff Green MRN: [id number] Date of Birth: [birth year]

## 2017-05-16 ENCOUNTER — Encounter: Payer: Self-pay | Admitting: Physical Therapy

## 2017-05-16 ENCOUNTER — Ambulatory Visit (INDEPENDENT_AMBULATORY_CARE_PROVIDER_SITE_OTHER): Payer: Medicare Other | Admitting: Physical Therapy

## 2017-05-16 DIAGNOSIS — M25512 Pain in left shoulder: Secondary | ICD-10-CM

## 2017-05-16 DIAGNOSIS — M25612 Stiffness of left shoulder, not elsewhere classified: Secondary | ICD-10-CM | POA: Diagnosis not present

## 2017-05-16 DIAGNOSIS — G8929 Other chronic pain: Secondary | ICD-10-CM | POA: Diagnosis not present

## 2017-05-16 DIAGNOSIS — M545 Low back pain, unspecified: Secondary | ICD-10-CM

## 2017-05-16 DIAGNOSIS — M6281 Muscle weakness (generalized): Secondary | ICD-10-CM

## 2017-05-16 DIAGNOSIS — R2689 Other abnormalities of gait and mobility: Secondary | ICD-10-CM | POA: Diagnosis not present

## 2017-05-16 NOTE — Therapy (Signed)
[place], [place], [address] Phone: [phone]   Fax:  [phone]  Physical Therapy Treatment  Patient Details  Name: Jeff Green MRN: [id number] Date of Birth: Sep 03, 1955 Referring Provider: Dr Dianah Field   Encounter Date: 05/16/2017  PT End of Session - 05/16/17 1023    Visit Number  7    Number of Visits  16    Date for PT Re-Evaluation  06/06/17    PT Start Time  8887    PT Stop Time  1103    PT Time Calculation (min)  48 min    Activity Tolerance  Patient limited by pain       Past Medical History:  Diagnosis Date  . Atrial fibrillation ([place])   . CAD (coronary artery disease)    Stent placed; [place]  . Congestive heart failure (CHF) ([place])   . DVT (deep vein thrombosis) in pregnancy ([place])   . High cholesterol   . Hypertension   . OSA (obstructive sleep apnea) 03/21/2017  . Pulmonary embolus [facility])     Past Surgical History:  Procedure Laterality Date  . HERNIA REPAIR    . IVC  2014  . lamenectomy    . left orchiectomy      There were no vitals filed for this visit.  Subjective Assessment - 05/16/17 1021    Subjective  Lt arm is still flared up    Patient Stated Goals  increase shoulder ROM and work on balance.     Currently in Pain?  Yes    Pain Score  8     Pain Location  Shoulder    Pain Orientation  Left    Pain Descriptors / Indicators  Aching;Penetrating;Tightness    Pain Type  Chronic pain    Pain Radiating Towards  deep in deltoid    Pain Onset  More than a month ago    Pain Frequency  Constant    Aggravating Factors   lifitng the arm    Pain Relieving Factors  biofreeze rub                      [facility] Adult PT Treatment/Exercise - 05/16/17 0001      Shoulder Exercises: Pulleys   Flexion  -- 5'  X 2 with shoulder AROM  after     Modalities   Modalities  Electrical Stimulation;Moist Heat;Ultrasound      Moist Heat  Therapy   Number Minutes Moist Heat  15 Minutes    Moist Heat Location  Shoulder;Lumbar Spine      Electrical Stimulation   Electrical Stimulation Location  Lt shoulder/ deltoid    Electrical Stimulation Action  burst    Electrical Stimulation Parameters  to tolerance    Electrical Stimulation Goals  Tone;Pain      Ultrasound   Ultrasound Location  Lt distal deltoid    Ultrasound Parameters  100%, 1.50mz, 1.5 w/cm2    Ultrasound Goals  Pain      Manual Therapy   Manual Therapy  Soft tissue mobilization    Soft tissue mobilization  STM and TPR to his Lt deltoid. Palpable tightness decreased after                   PT Long Term Goals - 05/09/17 1213      PT LONG TERM GOAL #1   Title  I with HEP for shoulder and balance ( 06/07/17)  Time  4    Period  Weeks    Status  On-going      PT LONG TERM GOAL #2   Title  improve DGI =/> 22/24 to decrease risk of falls ( 06/07/17)     Time  4    Period  Weeks    Status  On-going 19/20, one point higher      PT LONG TERM GOAL #3   Title  demo improved Lt shoulder motion to allow him to consistently reach to the top shelf (06/07/17)      Time  4    Period  Weeks    Status  -- not met as of today, had met on Tuesday of this week      PT LONG TERM GOAL #4   Title  report =/> 50% reduction of Lt shoulder pain ( 06/07/17)     Time  4    Status  -- was met ealier this week, not today      PT LONG TERM GOAL #5   Title  Improve FOTO to </= 38% limitation (06/07/17)     Time  4    Status  On-going scored 45% lmited today, was better on Tuesday            Plan - 05/16/17 1052    Clinical Impression Statement  Rich continues with tightness and pain in the Lt deltoid that is limiting his function and use of the Lt UE.  He had palpable decrease in tightness after todays treatment and was able to perform forward flexion with increased ease.  No goals met, still trying to settle down the flare up in the Lt shoulder from last  week.     Rehab Potential  Good    PT Frequency  2x / week    PT Next Visit Plan  re-assess shoulder and continue balance work and ankle strengthenign    Consulted and Agree with Plan of Care  Patient       Patient will benefit from skilled therapeutic intervention in order to improve the following deficits and impairments:  Abnormal gait, Pain, Increased muscle spasms, Impaired UE functional use, Decreased strength, Decreased range of motion, Decreased balance, Difficulty walking  Visit Diagnosis: Chronic left shoulder pain  Muscle weakness (generalized)  Stiffness of left shoulder, not elsewhere classified  Chronic bilateral low back pain without sciatica  Other abnormalities of gait and mobility     Problem List Patient Active Problem List   Diagnosis Date Noted  . Insomnia 04/05/2017  . OSA (obstructive sleep apnea) 03/21/2017  . Primary osteoarthritis of left shoulder 11/28/2016  . Diabetes mellitus type 2, diet-controlled ([place]) 06/08/2016  . Seborrheic dermatitis 06/08/2016  . Morbid obesity ([place]) 06/08/2016  . Ruptured tympanic membrane, bilateral 01/27/2016  . Annual physical exam 01/25/2016  . Paroxysmal atrial fibrillation ([place]) 06/15/2015  . Polycythemia, secondary 08/26/2014  . Hemochromatosis 08/26/2014  . Multiple pulmonary nodules 08/16/2014  . Testicular cancer ([place]) 08/12/2014  . Hyperlipidemia 08/10/2014  . Hypogonadism in male 08/10/2014  . Essential hypertension 08/10/2014  . Spinal stenosis of lumbar region 08/10/2014  . Recurrent pulmonary emboli ([place]) 08/10/2014  . CAD (coronary artery disease), native coronary artery 08/10/2014    [name] PT 05/16/2017, 10:55 AM  [facility] [place], [place], [address] Phone: [phone]   Fax:  [phone]  Name: Dean Goldner MRN: [id number] Date of Birth: [birth year]

## 2017-05-22 ENCOUNTER — Other Ambulatory Visit: Payer: Self-pay | Admitting: Sports Medicine

## 2017-05-22 ENCOUNTER — Ambulatory Visit (INDEPENDENT_AMBULATORY_CARE_PROVIDER_SITE_OTHER): Payer: Medicare Other | Admitting: Physical Therapy

## 2017-05-22 DIAGNOSIS — R2689 Other abnormalities of gait and mobility: Secondary | ICD-10-CM | POA: Diagnosis not present

## 2017-05-22 DIAGNOSIS — M545 Low back pain, unspecified: Secondary | ICD-10-CM

## 2017-05-22 DIAGNOSIS — G8929 Other chronic pain: Secondary | ICD-10-CM

## 2017-05-22 DIAGNOSIS — E785 Hyperlipidemia, unspecified: Secondary | ICD-10-CM

## 2017-05-22 DIAGNOSIS — M25512 Pain in left shoulder: Secondary | ICD-10-CM

## 2017-05-22 DIAGNOSIS — M6281 Muscle weakness (generalized): Secondary | ICD-10-CM | POA: Diagnosis not present

## 2017-05-22 DIAGNOSIS — M25612 Stiffness of left shoulder, not elsewhere classified: Secondary | ICD-10-CM | POA: Diagnosis not present

## 2017-05-22 NOTE — Therapy (Signed)
[place], [place], [address] Phone: [phone]   Fax:  [phone]  Physical Therapy Treatment  Patient Details  Name: Jeff Green MRN: [id number] Date of Birth: 10/02/1955 Referring Provider: Dr Dianah Field   Encounter Date: 05/22/2017  PT End of Session - 05/22/17 0856    Visit Number  8    Number of Visits  16    Date for PT Re-Evaluation  06/06/17    PT Start Time  0848    PT Stop Time  0941    PT Time Calculation (min)  53 min    Activity Tolerance  Patient tolerated treatment well       Past Medical History:  Diagnosis Date  . Atrial fibrillation ([place])   . CAD (coronary artery disease)    Stent placed; [place]  . Congestive heart failure (CHF) ([place])   . DVT (deep vein thrombosis) in pregnancy ([place])   . High cholesterol   . Hypertension   . OSA (obstructive sleep apnea) 03/21/2017  . Pulmonary embolus [facility])     Past Surgical History:  Procedure Laterality Date  . HERNIA REPAIR    . IVC  2014  . lamenectomy    . left orchiectomy      There were no vitals filed for this visit.  Subjective Assessment - 05/22/17 0856    Subjective  Lt shoulder is really sore and limited today.  Hasn't done anything different    Currently in Pain?  Yes    Pain Score  9     Pain Location  Shoulder    Pain Orientation  Left;Posterior    Pain Descriptors / Indicators  Aching;Sharp;Tightness    Pain Type  Chronic pain    Pain Onset  More than a month ago    Pain Frequency  Constant    Aggravating Factors   lifitng    Pain Relieving Factors  heat         [facility] PT Assessment - 05/22/17 0001      AROM   Left Shoulder Flexion  80 Degrees with a lot of pain                  [facility] Adult PT Treatment/Exercise - 05/22/17 0001      Self-Care   Self-Care  Other Self-Care Comments    Other Self-Care Comments   discussed use of TENs on his shoulder rec keeping  intensity below a twitch and only for pain relief.       Lumbar Exercises: Standing   Other Standing Lumbar Exercises  10 reps mini sqat with hip ext then BWD lunge with hip abduction.       Lumbar Exercises: Seated   Other Seated Lumbar Exercises  Rt ankle eversion, yellow band to fatigue, starting from inverted position. standing on black therapad, moving in/out of supination.       Shoulder Exercises: Pulleys   Flexion  -- 5'    ABduction  3 minutes      Modalities   Modalities  Moist Heat      Moist Heat Therapy   Number Minutes Moist Heat  12 Minutes    Moist Heat Location  Shoulder Lt seated          Balance Exercises - 05/22/17 0914      Balance Exercises: Standing   Tandem Gait  Forward;Retro;Intermittent upper extremity support;5 reps 10'    Other Standing Exercises  One foot  on rebounder, single arm rows attempted bilat arm rows he was unable. to perform.              PT Long Term Goals - 05/22/17 0762      PT LONG TERM GOAL #1   Title  I with HEP for shoulder and balance ( 06/07/17)     Status  On-going      PT LONG TERM GOAL #2   Title  improve DGI =/> 22/24 to decrease risk of falls ( 06/07/17)     Status  On-going      PT LONG TERM GOAL #3   Title  demo improved Lt shoulder motion to allow him to consistently reach to the top shelf (06/07/17)      Status  On-going      PT LONG TERM GOAL #4   Title  report =/> 50% reduction of Lt shoulder pain ( 06/07/17)     Status  On-going      PT LONG TERM GOAL #5   Title  Improve FOTO to </= 38% limitation (06/07/17)     Status  On-going            Plan - 05/22/17 0911    Clinical Impression Statement  [name] continues to have a set back in the Lt shoulder rehab.  This has happened with therapy in the past and over time with rest and gently motion on his own he was able to settle this down.  Todays focus was on balance deficits. No goals met.  Rec patinet use lower intensity on TENs iwth shoulder, keep  arm below 90 degrees of motion and  perform gentle isometics ER/IR     Rehab Potential  Good    PT Frequency  2x / week    PT Duration  4 weeks    PT Treatment/Interventions  Gait training;Neuromuscular re-education;Dry needling;Manual techniques;Moist Heat;Ultrasound;Therapeutic activities;Patient/family education;Therapeutic exercise;Cryotherapy;Electrical Stimulation;Passive range of motion    PT Next Visit Plan  re-assess shoulder and continue balance work and ankle strengthenign    Consulted and Agree with Plan of Care  Patient       Patient will benefit from skilled therapeutic intervention in order to improve the following deficits and impairments:  Abnormal gait, Pain, Increased muscle spasms, Impaired UE functional use, Decreased strength, Decreased range of motion, Decreased balance, Difficulty walking  Visit Diagnosis: Chronic left shoulder pain  Muscle weakness (generalized)  Stiffness of left shoulder, not elsewhere classified  Chronic bilateral low back pain without sciatica  Other abnormalities of gait and mobility     Problem List Patient Active Problem List   Diagnosis Date Noted  . Insomnia 04/05/2017  . OSA (obstructive sleep apnea) 03/21/2017  . Primary osteoarthritis of left shoulder 11/28/2016  . Diabetes mellitus type 2, diet-controlled ([place]) 06/08/2016  . Seborrheic dermatitis 06/08/2016  . Morbid obesity ([place]) 06/08/2016  . Ruptured tympanic membrane, bilateral 01/27/2016  . Annual physical exam 01/25/2016  . Paroxysmal atrial fibrillation ([place]) 06/15/2015  . Polycythemia, secondary 08/26/2014  . Hemochromatosis 08/26/2014  . Multiple pulmonary nodules 08/16/2014  . Testicular cancer ([place]) 08/12/2014  . Hyperlipidemia 08/10/2014  . Hypogonadism in male 08/10/2014  . Essential hypertension 08/10/2014  . Spinal stenosis of lumbar region 08/10/2014  . Recurrent pulmonary emboli ([place]) 08/10/2014  . CAD (coronary artery disease), native coronary  artery 08/10/2014    [name] PT  05/22/2017, 9:32 AM  [facility] [place], [place], [address] Phone:  [phone]   Fax:  [phone]  Name: Jeff Green MRN: [id number] Date of Birth: [birth year]

## 2017-05-23 ENCOUNTER — Ambulatory Visit (INDEPENDENT_AMBULATORY_CARE_PROVIDER_SITE_OTHER): Payer: Medicare Other | Admitting: Physical Therapy

## 2017-05-23 ENCOUNTER — Encounter: Payer: Self-pay | Admitting: Physical Therapy

## 2017-05-23 DIAGNOSIS — G8929 Other chronic pain: Secondary | ICD-10-CM

## 2017-05-23 DIAGNOSIS — M545 Low back pain, unspecified: Secondary | ICD-10-CM

## 2017-05-23 DIAGNOSIS — M25512 Pain in left shoulder: Secondary | ICD-10-CM

## 2017-05-23 DIAGNOSIS — M25612 Stiffness of left shoulder, not elsewhere classified: Secondary | ICD-10-CM

## 2017-05-23 DIAGNOSIS — R2689 Other abnormalities of gait and mobility: Secondary | ICD-10-CM

## 2017-05-23 DIAGNOSIS — M6281 Muscle weakness (generalized): Secondary | ICD-10-CM

## 2017-05-23 NOTE — Therapy (Signed)
[place], [place], [address] Phone: [phone]   Fax:  [phone]  Physical Therapy Treatment  Patient Details  Name: Jeff Green MRN: [id number] Date of Birth: Sep 06, 1955 Referring Provider: Dr Dianah Field   Encounter Date: 05/23/2017  PT End of Session - 05/23/17 1405    Visit Number  9    Number of Visits  16    Date for PT Re-Evaluation  06/06/17    PT Start Time  1401    PT Stop Time  1455    PT Time Calculation (min)  54 min    Activity Tolerance  Patient tolerated treatment well       Past Medical History:  Diagnosis Date  . Atrial fibrillation ([place])   . CAD (coronary artery disease)    Stent placed; [place]  . Congestive heart failure (CHF) ([place])   . DVT (deep vein thrombosis) in pregnancy ([place])   . High cholesterol   . Hypertension   . OSA (obstructive sleep apnea) 03/21/2017  . Pulmonary embolus [facility])     Past Surgical History:  Procedure Laterality Date  . HERNIA REPAIR    . IVC  2014  . lamenectomy    . left orchiectomy      There were no vitals filed for this visit.  Subjective Assessment - 05/22/17 0856    Subjective  Lt shoulder is really sore and limited today.  Hasn't done anything different    Currently in Pain?  Yes    Pain Score  9     Pain Location  Shoulder    Pain Orientation  Left;Posterior    Pain Descriptors / Indicators  Aching;Sharp;Tightness    Pain Type  Chronic pain    Pain Onset  More than a month ago    Pain Frequency  Constant    Aggravating Factors   lifitng    Pain Relieving Factors  heat                      [facility] Adult PT Treatment/Exercise - 05/23/17 0001      Lumbar Exercises: Stretches   Passive Hamstring Stretch  Left;Right;2 reps;60 seconds with gastroc stretch using strap, seating      Lumbar Exercises: Prone   Other Prone Lumbar Exercises  plank on counter with side/side "walking"        Shoulder Exercises: Pulleys   Flexion  3 minutes    ABduction  2 minutes      Modalities   Modalities  Electrical Stimulation;Moist Heat      Moist Heat Therapy   Number Minutes Moist Heat  15 Minutes    Moist Heat Location  Shoulder      Electrical Stimulation   Electrical Stimulation Location  Lt shoulder/ deltoid    Electrical Stimulation Action  normal    Electrical Stimulation Parameters  to tolerance    Electrical Stimulation Goals  Tone;Pain          Balance Exercises - 05/23/17 1444      Balance Exercises: Seated   Other Seated Exercises  on black  therapad withmarching, then pelvic clocks             PT Long Term Goals - 05/22/17 0904      PT LONG TERM GOAL #1   Title  I with HEP for shoulder and balance ( 06/07/17)     Status  On-going  PT LONG TERM GOAL #2   Title  improve DGI =/> 22/24 to decrease risk of falls ( 06/07/17)     Status  On-going      PT LONG TERM GOAL #3   Title  demo improved Lt shoulder motion to allow him to consistently reach to the top shelf (06/07/17)      Status  On-going      PT LONG TERM GOAL #4   Title  report =/> 50% reduction of Lt shoulder pain ( 06/07/17)     Status  On-going      PT LONG TERM GOAL #5   Title  Improve FOTO to </= 38% limitation (06/07/17)     Status  On-going            Plan - 05/23/17 1415    Clinical Impression Statement  Jeff Green is reporting some improvement in the Lt shoulder symptoms, mainly in the lateral deltoid now, better in the front and back of the shoulder.  Doing well with balance work on compliant surfaces with bilat LE support, a lot of difficulty with SLS    Rehab Potential  Good    PT Frequency  2x / week    PT Duration  4 weeks    PT Treatment/Interventions  Gait training;Neuromuscular re-education;Dry needling;Manual techniques;Moist Heat;Ultrasound;Therapeutic activities;Patient/family education;Therapeutic exercise;Cryotherapy;Electrical Stimulation;Passive range of motion     PT Next Visit Plan  re-assess shoulder and continue balance work and ankle strengthenign       Patient will benefit from skilled therapeutic intervention in order to improve the following deficits and impairments:  Abnormal gait, Pain, Increased muscle spasms, Impaired UE functional use, Decreased strength, Decreased range of motion, Decreased balance, Difficulty walking  Visit Diagnosis: Chronic left shoulder pain  Muscle weakness (generalized)  Stiffness of left shoulder, not elsewhere classified  Chronic bilateral low back pain without sciatica  Other abnormalities of gait and mobility     Problem List Patient Active Problem List   Diagnosis Date Noted  . Insomnia 04/05/2017  . OSA (obstructive sleep apnea) 03/21/2017  . Primary osteoarthritis of left shoulder 11/28/2016  . Diabetes mellitus type 2, diet-controlled ([place]) 06/08/2016  . Seborrheic dermatitis 06/08/2016  . Morbid obesity ([place]) 06/08/2016  . Ruptured tympanic membrane, bilateral 01/27/2016  . Annual physical exam 01/25/2016  . Paroxysmal atrial fibrillation ([place]) 06/15/2015  . Polycythemia, secondary 08/26/2014  . Hemochromatosis 08/26/2014  . Multiple pulmonary nodules 08/16/2014  . Testicular cancer ([place]) 08/12/2014  . Hyperlipidemia 08/10/2014  . Hypogonadism in male 08/10/2014  . Essential hypertension 08/10/2014  . Spinal stenosis of lumbar region 08/10/2014  . Recurrent pulmonary emboli ([place]) 08/10/2014  . CAD (coronary artery disease), native coronary artery 08/10/2014    [name] PT  05/23/2017, 2:45 PM  [facility] [place], [place], [address] Phone: [phone]   Fax:  [phone]  Name: Jeff Green MRN: [id number] Date of Birth: 01-29-1956

## 2017-05-28 ENCOUNTER — Telehealth: Payer: Self-pay | Admitting: Physical Therapy

## 2017-05-28 ENCOUNTER — Encounter: Payer: Medicare Other | Admitting: Physical Therapy

## 2017-05-28 NOTE — Telephone Encounter (Signed)
Left message, patient missed todays appointment

## 2017-05-29 ENCOUNTER — Ambulatory Visit (INDEPENDENT_AMBULATORY_CARE_PROVIDER_SITE_OTHER): Payer: Medicare Other | Admitting: Sports Medicine

## 2017-05-29 ENCOUNTER — Encounter: Payer: Self-pay | Admitting: Sports Medicine

## 2017-05-29 DIAGNOSIS — L821 Other seborrheic keratosis: Secondary | ICD-10-CM | POA: Diagnosis not present

## 2017-05-29 DIAGNOSIS — R918 Other nonspecific abnormal finding of lung field: Secondary | ICD-10-CM

## 2017-05-29 DIAGNOSIS — I10 Essential (primary) hypertension: Secondary | ICD-10-CM | POA: Diagnosis not present

## 2017-05-29 DIAGNOSIS — M19012 Primary osteoarthritis, left shoulder: Secondary | ICD-10-CM

## 2017-05-29 MED ORDER — WARFARIN SODIUM 7.5 MG PO TABS: 7.5000 mg | ORAL_TABLET | Freq: Every day | ORAL | 1 refills | Status: DC

## 2017-05-29 MED ORDER — PHENTERMINE HCL 37.5 MG PO TABS: ORAL_TABLET | ORAL | 0 refills | Status: DC

## 2017-05-29 NOTE — Progress Notes (Signed)
Subjective:    CC: Multiple issues  HPI: Left shoulder pain: Known glenohumeral osteoarthritis, has had a few injections, some physical therapy, pain has recurred.  Moderate, persistent, localized to the joint line without radiation.  Skin lesion: Right posterior shoulder, asymptomatic with the exception of occasional itch.  Obesity: Moderate weight loss, desire to continue phentermine.  Hypertension: Unfortunately was inadvertently taking telmisartan and irbesartan, blood pressures have been running a bit low.  I reviewed the past medical history, family history, social history, surgical history, and allergies today and no changes were needed.  Please see the problem list section below in epic for further details.  Past Medical History: Past Medical History:  Diagnosis Date  . Atrial fibrillation ([place])   . CAD (coronary artery disease)    Stent placed; [place]  . Congestive heart failure (CHF) ([place])   . DVT (deep vein thrombosis) in pregnancy ([place])   . High cholesterol   . Hypertension   . OSA (obstructive sleep apnea) 03/21/2017  . Pulmonary embolus [facility])    Past Surgical History: Past Surgical History:  Procedure Laterality Date  . HERNIA REPAIR    . IVC  2014  . lamenectomy    . left orchiectomy     Social History: Social History   Socioeconomic History  . Marital status: Married    Spouse name: None  . Number of children: 5  . Years of education: None  . Highest education level: None  Social Needs  . Financial resource strain: None  . Food insecurity - worry: None  . Food insecurity - inability: None  . Transportation needs - medical: None  . Transportation needs - non-medical: None  Occupational History  . None  Tobacco Use  . Smoking status: Former Smoker    Last attempt to quit: 08/10/1999    Years since quitting: 17.8  . Smokeless tobacco: Never Used  Substance and Sexual Activity  . Alcohol use: Yes    Alcohol/week: 0.0 oz   Comment: Rare  . Drug use: No  . Sexual activity: Yes    Partners: Female  Other Topics Concern  . None  Social History Narrative  . None   Family History: Family History  Problem Relation Age of Onset  . Thyroid cancer Mother   . Depression Mother   . High Cholesterol Father   . High blood pressure Father   . Heart disease Father        Atrial fibrillation  . Stroke Unknown        Grandfather   Allergies: Allergies  Allergen Reactions  . Lipitor [Atorvastatin]     myalgias   Medications: See med rec.  Review of Systems: No fevers, chills, night sweats, weight loss, chest pain, or shortness of breath.   Objective:    General: Well Developed, well nourished, and in no acute distress.  Neuro: Alert and oriented x3, extra-ocular muscles intact, sensation grossly intact.  HEENT: Normocephalic, atraumatic, pupils equal round reactive to light, neck supple, no masses, no lymphadenopathy, thyroid nonpalpable.  Skin: Warm and dry, no rashes. Cardiac: Regular rate and rhythm, no murmurs rubs or gallops, no lower extremity edema.  Respiratory: Clear to auscultation bilaterally. Not using accessory muscles, speaking in full sentences. Left shoulder: Pain with abduction, external rotation, only minimal impingement symptoms.  Procedure: Real-time Ultrasound Guided Injection of left glenohumeral joint Device: GE Logiq E  Verbal informed consent obtained.  Time-out conducted.  Noted no overlying erythema, induration, or other signs of  local infection.  Skin prepped in a sterile fashion.  Local anesthesia: Topical Ethyl chloride.  With sterile technique and under real time ultrasound guidance: Using a 22-gauge spinal needle and taking a posterior approach and care to avoid the labrum I advanced into the joint and injected 1 cc kenalog 40, 2 cc lidocaine 2 cc bupivacaine. Completed without difficulty  Pain immediately resolved suggesting accurate placement of the medication.    Advised to call if fevers/chills, erythema, induration, drainage, or persistent bleeding.  Images permanently stored and available for review in the ultrasound unit.  Impression: Technically successful ultrasound guided injection.  Procedure:  Cryodestruction of #3 seborrheic keratoses, 2 on the posterior shoulder and one on the anterior arm. Consent obtained and verified. Time-out conducted. Noted no overlying erythema, induration, or other signs of local infection. Completed without difficulty using Cryo-Gun. Advised to call if fevers/chills, erythema, induration, drainage, or persistent bleeding.  Impression and Recommendations:    Primary osteoarthritis of left shoulder Repeat left glenohumeral injection, referral to discuss arthroplasty. Has failed multiple injections and physical therapy.  Essential hypertension Taking 2 different ARB's. Discontinue the telmisartan, he will only do irbesartan.  Morbid obesity ([place]) Good weight loss after the third month, entering the fourth month of phentermine. Also currently working towards bariatric surgery.  Seborrheic keratosis Cryotherapy x3, 2 on the right posterior shoulder and 1 on the arm anteriorly.  ___________________________________________ Gwen Her. Dianah Field, M.D., ABFM., CAQSM. Primary Care and [place] Instructor of [employer] [facility] [employer]

## 2017-05-29 NOTE — Assessment & Plan Note (Signed)
Repeat left glenohumeral injection, referral to discuss arthroplasty. Has failed multiple injections and physical therapy.

## 2017-05-29 NOTE — Assessment & Plan Note (Signed)
Taking 2 different ARB's. Discontinue the telmisartan, he will only do irbesartan.

## 2017-05-29 NOTE — Assessment & Plan Note (Signed)
Cryotherapy x3, 2 on the right posterior shoulder and 1 on the arm anteriorly.

## 2017-05-29 NOTE — Assessment & Plan Note (Signed)
Good weight loss after the third month, entering the fourth month of phentermine. Also currently working towards bariatric surgery.

## 2017-05-30 ENCOUNTER — Ambulatory Visit (INDEPENDENT_AMBULATORY_CARE_PROVIDER_SITE_OTHER): Payer: Medicare Other | Admitting: Physical Therapy

## 2017-05-30 DIAGNOSIS — M545 Low back pain, unspecified: Secondary | ICD-10-CM

## 2017-05-30 DIAGNOSIS — M25612 Stiffness of left shoulder, not elsewhere classified: Secondary | ICD-10-CM

## 2017-05-30 DIAGNOSIS — M25512 Pain in left shoulder: Secondary | ICD-10-CM | POA: Diagnosis present

## 2017-05-30 DIAGNOSIS — R2689 Other abnormalities of gait and mobility: Secondary | ICD-10-CM

## 2017-05-30 DIAGNOSIS — G8929 Other chronic pain: Secondary | ICD-10-CM | POA: Diagnosis not present

## 2017-05-30 DIAGNOSIS — M6281 Muscle weakness (generalized): Secondary | ICD-10-CM | POA: Diagnosis not present

## 2017-05-30 NOTE — Therapy (Signed)
[place], [place], [address] Phone: [phone]   Fax:  [phone]  Physical Therapy Treatment  Patient Details  Name: Jeff Green MRN: [id number] Date of Birth: December 10, 1955 Referring Provider: Dr Dianah Field   Encounter Date: 05/30/2017  PT End of Session - 05/30/17 1403    Visit Number  10    Number of Visits  16    Date for PT Re-Evaluation  06/06/17    PT Start Time  8891    PT Stop Time  1458    PT Time Calculation (min)  54 min    Activity Tolerance  Patient tolerated treatment well       Past Medical History:  Diagnosis Date  . Atrial fibrillation ([place])   . CAD (coronary artery disease)    Stent placed; [place]  . Congestive heart failure (CHF) ([place])   . DVT (deep vein thrombosis) in pregnancy ([place])   . High cholesterol   . Hypertension   . OSA (obstructive sleep apnea) 03/21/2017  . Pulmonary embolus [facility])     Past Surgical History:  Procedure Laterality Date  . HERNIA REPAIR    . IVC  2014  . lamenectomy    . left orchiectomy      There were no vitals filed for this visit.  Subjective Assessment - 05/30/17 1406    Subjective  Pt apologized for missing Tuesday. he had a friend with a medical issue they had to attend to. Overall his shoulder is feeling better, MD checked shoulder yesterday.  Feels there is definetely arthritis in the joint, got an injection and now has full motion.  Being referred to Dr Tamera Punt     Patient Stated Goals  increase shoulder ROM and work on balance.     Currently in Pain?  No/denies                      [facility] Adult PT Treatment/Exercise - 05/30/17 0001      Lumbar Exercises: Stretches   Active Hamstring Stretch  Left;Right 30 sec gastrco stretch on prostrech       Lumbar Exercises: Standing   Other Standing Lumbar Exercises  sit to/from stand using high mat table each side. Very difficult for pt, uses  thigh on mat to stabilize.  Bed adjusted and VC given to assist. Pt did better using counter  ended up with Rt LE down single leg up bilat       Shoulder Exercises: Pulleys   Flexion  3 minutes    ABduction  2 minutes      Modalities   Modalities  Cryotherapy      Cryotherapy   Number Minutes Cryotherapy  12 Minutes    Cryotherapy Location  Lumbar Spine    Type of Cryotherapy  Ice pack          Balance Exercises - 05/30/17 1435      Balance Exercises: Standing   Tandem Stance  30 secs;5 reps one foot on rebounder, other on floor, moving arms up/down             PT Long Term Goals - 05/30/17 1410      PT LONG TERM GOAL #1   Title  I with HEP for shoulder and balance ( 06/07/17)     Status  On-going      PT LONG TERM GOAL #2   Title  improve DGI =/> 22/24 to decrease risk  of falls ( 06/07/17)     Status  On-going      PT LONG TERM GOAL #3   Title  demo improved Lt shoulder motion to allow him to consistently reach to the top shelf (06/07/17)      Status  On-going      PT LONG TERM GOAL #4   Title  report =/> 50% reduction of Lt shoulder pain ( 06/07/17)     Status  Achieved as of today      PT LONG TERM GOAL #5   Title  Improve FOTO to </= 38% limitation (06/07/17)     Status  On-going            Plan - 05/30/17 1426    Clinical Impression Statement  Jeff Green's shoulder is better today after having the injection yesterday .  He is being referred to orthopedic surgery to have it assessed. Had a lot of trouble with SLS activities today, partly due to proprioception and balance issues. No new goals met.     Rehab Potential  Good    PT Frequency  2x / week    PT Duration  4 weeks    PT Treatment/Interventions  Gait training;Neuromuscular re-education;Dry needling;Manual techniques;Moist Heat;Ultrasound;Therapeutic activities;Patient/family education;Therapeutic exercise;Cryotherapy;Electrical Stimulation;Passive range of motion    PT Next Visit Plan  assess for  continued treatment and perform FOTO    Consulted and Agree with Plan of Care  Patient       Patient will benefit from skilled therapeutic intervention in order to improve the following deficits and impairments:  Abnormal gait, Pain, Increased muscle spasms, Impaired UE functional use, Decreased strength, Decreased range of motion, Decreased balance, Difficulty walking  Visit Diagnosis: Chronic left shoulder pain  Muscle weakness (generalized)  Stiffness of left shoulder, not elsewhere classified  Chronic bilateral low back pain without sciatica  Other abnormalities of gait and mobility     Problem List Patient Active Problem List   Diagnosis Date Noted  . Seborrheic keratosis 05/29/2017  . Insomnia 04/05/2017  . OSA (obstructive sleep apnea) 03/21/2017  . Primary osteoarthritis of left shoulder 11/28/2016  . Diabetes mellitus type 2, diet-controlled ([place]) 06/08/2016  . Seborrheic dermatitis 06/08/2016  . Morbid obesity ([place]) 06/08/2016  . Ruptured tympanic membrane, bilateral 01/27/2016  . Annual physical exam 01/25/2016  . Paroxysmal atrial fibrillation ([place]) 06/15/2015  . Polycythemia, secondary 08/26/2014  . Hemochromatosis 08/26/2014  . Multiple pulmonary nodules 08/16/2014  . Testicular cancer ([place]) 08/12/2014  . Hyperlipidemia 08/10/2014  . Hypogonadism in male 08/10/2014  . Essential hypertension 08/10/2014  . Spinal stenosis of lumbar region 08/10/2014  . Recurrent pulmonary emboli ([place]) 08/10/2014  . CAD (coronary artery disease), native coronary artery 08/10/2014    [name] PT  05/30/2017, 2:48 PM  [facility] [place], [place], [address] Phone: [phone]   Fax:  [phone]  Name: Jeff Green MRN: [id number] Date of Birth: 1955/09/15

## 2017-06-05 ENCOUNTER — Encounter: Payer: Medicare Other | Admitting: Physical Therapy

## 2017-06-07 ENCOUNTER — Encounter: Payer: Medicare Other | Admitting: Physical Therapy

## 2017-06-18 DIAGNOSIS — G894 Chronic pain syndrome: Secondary | ICD-10-CM | POA: Diagnosis not present

## 2017-06-18 DIAGNOSIS — Z79899 Other long term (current) drug therapy: Secondary | ICD-10-CM | POA: Diagnosis not present

## 2017-06-18 DIAGNOSIS — M25519 Pain in unspecified shoulder: Secondary | ICD-10-CM | POA: Diagnosis not present

## 2017-06-18 DIAGNOSIS — Z79891 Long term (current) use of opiate analgesic: Secondary | ICD-10-CM | POA: Diagnosis not present

## 2017-06-18 DIAGNOSIS — M25561 Pain in right knee: Secondary | ICD-10-CM | POA: Diagnosis not present

## 2017-06-18 DIAGNOSIS — M545 Low back pain: Secondary | ICD-10-CM | POA: Diagnosis not present

## 2017-06-20 ENCOUNTER — Ambulatory Visit (INDEPENDENT_AMBULATORY_CARE_PROVIDER_SITE_OTHER): Payer: Medicare Other | Admitting: Physical Therapy

## 2017-06-20 ENCOUNTER — Encounter: Payer: Self-pay | Admitting: Physical Therapy

## 2017-06-20 DIAGNOSIS — M25612 Stiffness of left shoulder, not elsewhere classified: Secondary | ICD-10-CM | POA: Diagnosis not present

## 2017-06-20 DIAGNOSIS — G8929 Other chronic pain: Secondary | ICD-10-CM | POA: Diagnosis not present

## 2017-06-20 DIAGNOSIS — R2689 Other abnormalities of gait and mobility: Secondary | ICD-10-CM | POA: Diagnosis not present

## 2017-06-20 DIAGNOSIS — M545 Low back pain, unspecified: Secondary | ICD-10-CM

## 2017-06-20 DIAGNOSIS — M6281 Muscle weakness (generalized): Secondary | ICD-10-CM

## 2017-06-20 DIAGNOSIS — M25512 Pain in left shoulder: Secondary | ICD-10-CM | POA: Diagnosis present

## 2017-06-20 NOTE — Therapy (Signed)
[place], [place], [address] Phone: [phone]   Fax:  [phone]  Physical Therapy Treatment  Patient Details  Name: Jeff Green MRN: [id number] Date of Birth: Sep 17, 1955 Referring Provider: Dr Dianah Field   Encounter Date: 06/20/2017  PT End of Session - 06/20/17 0932    Visit Number  11    Number of Visits  17    Date for PT Re-Evaluation  08/01/17    PT Start Time  0928    PT Stop Time  1026    PT Time Calculation (min)  58 min    Activity Tolerance  Patient tolerated treatment well       Past Medical History:  Diagnosis Date  . Atrial fibrillation ([place])   . CAD (coronary artery disease)    Stent placed; [place]  . Congestive heart failure (CHF) ([place])   . DVT (deep vein thrombosis) in pregnancy ([place])   . High cholesterol   . Hypertension   . OSA (obstructive sleep apnea) 03/21/2017  . Pulmonary embolus [facility])     Past Surgical History:  Procedure Laterality Date  . HERNIA REPAIR    . IVC  2014  . lamenectomy    . left orchiectomy      There were no vitals filed for this visit.  Subjective Assessment - 06/20/17 0937    Subjective  Shoulder had another injection, the shoulder is now doing very well, he is weaning off all medication and is hopeful to be done in 2 wks.  Main concern now is balance     Patient Stated Goals   work on balance and feeling stable in standing.     Currently in Pain?  Yes    Pain Score  4     Pain Location  Hip    Pain Orientation  Right    Pain Descriptors / Indicators  Aching    Pain Type  Chronic pain    Pain Onset  More than a month ago    Pain Frequency  Constant    Aggravating Factors   standing    Pain Relieving Factors  heat         [facility] PT Assessment - 06/20/17 0001      Assessment   Medical Diagnosis   lumbar spondylosis and gait instability    Referring Provider  Dr Dianah Field    Onset Date/Surgical Date   11/25/16    Hand Dominance  Right      Observation/Other Assessments   Focus on Therapeutic Outcomes (FOTO)   29% limited      ROM / Strength   AROM / PROM / Strength  Strength      AROM   Left Shoulder Flexion  132 Degrees      Strength   Strength Assessment Site  Ankle;Hip    Right/Left Hip  Right    Right Hip Flexion  5/5    Right Hip Extension  -- 5-/5    Right Hip ABduction  4/5    Right/Left Ankle  Right Lt WNL    Right Ankle Dorsiflexion  4-/5    Right Ankle Inversion  4/5    Right Ankle Eversion  3-/5      Dynamic Gait Index   Level Surface  Mild Impairment foot slap and trendelenberg Rt     Change in Gait Speed  Mild Impairment    Gait with Horizontal Head Turns  Normal    Gait  with Vertical Head Turns  Normal    Gait and Pivot Turn  Normal    Step Over Obstacle  Mild Impairment    Step Around Obstacles  Normal    Steps  Mild Impairment    Total Score  20                  [facility] Adult PT Treatment/Exercise - 06/20/17 0001      Lumbar Exercises: Standing   Other Standing Lumbar Exercises  in high kneel - wt shifts FWD/BWD, then "side stepping" on matt      Lumbar Exercises: Seated   Other Seated Lumbar Exercises  BAPS L2, RT ankle in all directions      Lumbar Exercises: Supine   Bridge  20 reps single leg       Lumbar Exercises: Sidelying   Other Sidelying Lumbar Exercises  20 reps Rt hip pilates FWD/BWD kicks, CW/CCW cirlces      Shoulder Exercises: Pulleys   Flexion  -- 5' prior to starting formal therapy      Modalities   Modalities  Electrical engineer Stimulation Location  Lt post shoulder and Rt low back/hip    Electrical Stimulation Action  premod    Electrical Stimulation Parameters  to tolerance    Electrical Stimulation Goals  Tone;Pain                  PT Long Term Goals - 06/20/17 0934      PT LONG TERM GOAL #1   Title  I with HEP for shoulder and balance ( 10/01/17)      Time  6    Period  Weeks    Status  Partially Met met for shoulder      PT LONG TERM GOAL #2   Title  improve DGI =/> 22/24 to decrease risk of falls ( 08/01/17)     Status  On-going scored 20/24      PT LONG TERM GOAL #3   Title  demo improved Lt shoulder motion to allow him to consistently reach to the top shelf (06/07/17)      Status  Achieved      PT LONG TERM GOAL #4   Title  report =/> 50% reduction of Lt shoulder pain ( 06/07/17)     Status  Achieved      PT LONG TERM GOAL #5   Title  Improve FOTO to </= 38% limitation (06/07/17)     Status  Achieved 29% limited      Additional Long Term Goals   Additional Long Term Goals  Yes      PT LONG TERM GOAL #6   Title  pt able to stand in static position with minimal to no feelings of instabilty for 10' ( 08/01/17)     Time  6    Period  Weeks    Status  New      PT LONG TERM GOAL #7   Title  improve Rt hip abduction strength =/> 5-/5 to assist with normalized gait ( 08/01/17)     Time  6    Period  Weeks    Status  New            Plan - 06/20/17 1002    Clinical Impression Statement  [name] has met all goals as relates to his shoulder, this treatment will be stopped.  He continues to have defecits with his  balance and Rt LE strength.  We will change our focus and work on this to improve his stability in standing and decrease gait abnormalities of foot slap and trendelenberg.      Rehab Potential  Good    PT Frequency  1x / week    PT Duration  6 weeks    PT Treatment/Interventions  Gait training;Neuromuscular re-education;Dry needling;Manual techniques;Moist Heat;Ultrasound;Therapeutic activities;Patient/family education;Therapeutic exercise;Cryotherapy;Electrical Stimulation;Passive range of motion    PT Next Visit Plan  focus on balance, shoulder resolved    Consulted and Agree with Plan of Care  Patient       Patient will benefit from skilled therapeutic intervention in order to improve the following deficits and  impairments:  Abnormal gait, Pain, Increased muscle spasms, Impaired UE functional use, Decreased strength, Decreased range of motion, Decreased balance, Difficulty walking  Visit Diagnosis: Chronic left shoulder pain - Plan: PT plan of care cert/re-cert  Muscle weakness (generalized) - Plan: PT plan of care cert/re-cert  Stiffness of left shoulder, not elsewhere classified - Plan: PT plan of care cert/re-cert  Chronic bilateral low back pain without sciatica - Plan: PT plan of care cert/re-cert  Other abnormalities of gait and mobility - Plan: PT plan of care cert/re-cert     Problem List Patient Active Problem List   Diagnosis Date Noted  . Seborrheic keratosis 05/29/2017  . Insomnia 04/05/2017  . OSA (obstructive sleep apnea) 03/21/2017  . Primary osteoarthritis of left shoulder 11/28/2016  . Diabetes mellitus type 2, diet-controlled ([place]) 06/08/2016  . Seborrheic dermatitis 06/08/2016  . Morbid obesity ([place]) 06/08/2016  . Ruptured tympanic membrane, bilateral 01/27/2016  . Annual physical exam 01/25/2016  . Paroxysmal atrial fibrillation ([place]) 06/15/2015  . Polycythemia, secondary 08/26/2014  . Hemochromatosis 08/26/2014  . Multiple pulmonary nodules 08/16/2014  . Testicular cancer ([place]) 08/12/2014  . Hyperlipidemia 08/10/2014  . Hypogonadism in male 08/10/2014  . Essential hypertension 08/10/2014  . Spinal stenosis of lumbar region 08/10/2014  . Recurrent pulmonary emboli ([place]) 08/10/2014  . CAD (coronary artery disease), native coronary artery 08/10/2014    [name] PT  06/20/2017, 10:13 AM  [facility] [place], [place], [address] Phone: [phone]   Fax:  [phone]  Name: Kannen Moxey MRN: [id number] Date of Birth: 09-18-1955

## 2017-06-25 ENCOUNTER — Ambulatory Visit (INDEPENDENT_AMBULATORY_CARE_PROVIDER_SITE_OTHER): Payer: Medicare Other | Admitting: Physical Therapy

## 2017-06-25 DIAGNOSIS — R2689 Other abnormalities of gait and mobility: Secondary | ICD-10-CM | POA: Diagnosis not present

## 2017-06-25 DIAGNOSIS — M545 Low back pain, unspecified: Secondary | ICD-10-CM

## 2017-06-25 DIAGNOSIS — G8929 Other chronic pain: Secondary | ICD-10-CM | POA: Diagnosis not present

## 2017-06-25 DIAGNOSIS — M6281 Muscle weakness (generalized): Secondary | ICD-10-CM | POA: Diagnosis present

## 2017-06-25 NOTE — Therapy (Signed)
Jeff [place], [place], [address] Phone: [phone]   Fax:  [phone]  Physical Therapy Treatment  Patient Details  Name: Jeff Green MRN: [id number] Date of Birth: [birth year] Referring Provider: Dr Dianah Field   Encounter Date: 06/25/2017  PT End of Session - 06/25/17 1022    Visit Number  12    Number of Visits  17    Date for PT Re-Evaluation  08/01/17    PT Start Time  1019    PT Stop Time  1117    PT Time Calculation (min)  58 min    Activity Tolerance  Patient limited by fatigue       Past Medical History:  Diagnosis Date  . Atrial fibrillation ([place])   . CAD (coronary artery disease)    Stent placed; [place]  . Congestive heart failure (CHF) ([place])   . DVT (deep vein thrombosis) in pregnancy ([place])   . High cholesterol   . Hypertension   . OSA (obstructive sleep apnea) 03/21/2017  . Pulmonary embolus [facility])     Past Surgical History:  Procedure Laterality Date  . HERNIA REPAIR    . IVC  2014  . lamenectomy    . left orchiectomy      There were no vitals filed for this visit.  Subjective Assessment - 06/25/17 1024    Subjective  [name] reports his Lt shoudle was sore today, a little bit, he did take some things out from under the oven on Sunday and thinks this may have triggered it.  He also states the Lt leg gave out on Sunday and he thinks it came from the back.  He did not fall just wobbled some on the leg and felt like he had no strength    Patient Stated Goals   work on balance and feeling stable in standing.     Currently in Pain?  Yes    Pain Score  2     Pain Location  Shoulder    Pain Orientation  Left    Pain Descriptors / Indicators  Aching    Pain Onset  More than a month ago    Pain Frequency  Intermittent         [facility] PT Assessment - 06/25/17 0001      Assessment   Medical Diagnosis   lumbar spondylosis and gait instability    Hand  Dominance  Right    Next MD Visit  06/28/17                  [facility] Adult PT Treatment/Exercise - 06/25/17 0001      Lumbar Exercises: Standing   Other Standing Lumbar Exercises  single leg sit to stand 2x5 each side to high mat table with VC for form.       Lumbar Exercises: Supine   Bridge  -- 3x10 figure 4 with 15# on hip    Other Supine Lumbar Exercises  supine leg lengtheners 10 reps each side.       Lumbar Exercises: Quadruped   Other Quadruped Lumbar Exercises  in half knee on mat, front back UE row low , more difficulty with stabiltiy when Rt leg is forward      Shoulder Exercises: Pulleys   Flexion  -- pt came in early to loosen up the shoulders      Modalities   Modalities  Electrical Stimulation      Moist Heat Therapy  Number Minutes Moist Heat  15 Minutes    Moist Heat Location  Shoulder Lt       Cryotherapy   Number Minutes Cryotherapy  15 Minutes    Cryotherapy Location  Lumbar Spine    Type of Cryotherapy  Ice pack      Electrical Stimulation   Electrical Stimulation Location  Lt post shoulder and Rt low back/hip    Electrical Stimulation Action  premod    Electrical Stimulation Parameters  to tolerance    Electrical Stimulation Goals  Tone;Pain                  PT Long Term Goals - 06/25/17 0001      PT LONG TERM GOAL #1   Title  I with HEP for shoulder and balance ( 10/01/17)     Status  Partially Met      PT LONG TERM GOAL #2   Title  improve DGI =/> 22/24 to decrease risk of falls ( 08/01/17)     Status  On-going      PT LONG TERM GOAL #6   Title  pt able to stand in static position with minimal to no feelings of instabilty for 10' ( 08/01/17)     Status  On-going      PT LONG TERM GOAL #7   Title  improve Rt hip abduction strength =/> 5-/5 to assist with normalized gait ( 08/01/17)     Status  On-going            Plan - 06/25/17 1030    Clinical Impression Statement  [name] has made some progress, his symptoms are  inconsistent and don't follow a typical pattern of illness or impairment.  Wondering if he needs further work up with either a nerve conduction study for lower body or full rheumatatogy work up to see if he has other impairments/muscle disorders causing his symtpoms.     Rehab Potential  Good    PT Frequency  1x / week    PT Duration  6 weeks    PT Treatment/Interventions  Gait training;Neuromuscular re-education;Dry needling;Manual techniques;Moist Heat;Ultrasound;Therapeutic activities;Patient/family education;Therapeutic exercise;Cryotherapy;Electrical Stimulation;Passive range of motion    PT Next Visit Plan  focus on balance, core stability, shoulder resolved    Consulted and Agree with Plan of Care  Patient       Patient will benefit from skilled therapeutic intervention in order to improve the following deficits and impairments:  Abnormal gait, Pain, Increased muscle spasms, Impaired UE functional use, Decreased strength, Decreased range of motion, Decreased balance, Difficulty walking  Visit Diagnosis: Muscle weakness (generalized)  Chronic bilateral low back pain without sciatica  Other abnormalities of gait and mobility     Problem List Patient Active Problem List   Diagnosis Date Noted  . Seborrheic keratosis 05/29/2017  . Insomnia 04/05/2017  . OSA (obstructive sleep apnea) 03/21/2017  . Primary osteoarthritis of left shoulder 11/28/2016  . Diabetes mellitus type 2, diet-controlled ([place]) 06/08/2016  . Seborrheic dermatitis 06/08/2016  . Morbid obesity ([place]) 06/08/2016  . Ruptured tympanic membrane, bilateral 01/27/2016  . Annual physical exam 01/25/2016  . Paroxysmal atrial fibrillation ([place]) 06/15/2015  . Polycythemia, secondary 08/26/2014  . Hemochromatosis 08/26/2014  . Multiple pulmonary nodules 08/16/2014  . Testicular cancer ([place]) 08/12/2014  . Hyperlipidemia 08/10/2014  . Hypogonadism in male 08/10/2014  . Essential hypertension 08/10/2014  . Spinal  stenosis of lumbar region 08/10/2014  . Recurrent pulmonary emboli ([place]) 08/10/2014  . CAD (coronary artery  disease), native coronary artery 08/10/2014    [name] PT  06/25/2017, 11:15 AM  [facility] [place], [place], [address] Phone: [phone]   Fax:  [phone]  Name: Jeff Green MRN: [id number] Date of Birth: 1955/07/06

## 2017-06-26 ENCOUNTER — Ambulatory Visit: Payer: Medicare Other | Admitting: Sports Medicine

## 2017-06-26 ENCOUNTER — Encounter: Payer: Medicare Other | Admitting: Physical Therapy

## 2017-06-28 ENCOUNTER — Encounter: Payer: Self-pay | Admitting: Sports Medicine

## 2017-06-28 ENCOUNTER — Ambulatory Visit (INDEPENDENT_AMBULATORY_CARE_PROVIDER_SITE_OTHER): Payer: Medicare Other | Admitting: Sports Medicine

## 2017-06-28 DIAGNOSIS — G4733 Obstructive sleep apnea (adult) (pediatric): Secondary | ICD-10-CM

## 2017-06-28 DIAGNOSIS — I480 Paroxysmal atrial fibrillation: Secondary | ICD-10-CM | POA: Diagnosis not present

## 2017-06-28 DIAGNOSIS — I2699 Other pulmonary embolism without acute cor pulmonale: Secondary | ICD-10-CM | POA: Diagnosis not present

## 2017-06-28 DIAGNOSIS — I10 Essential (primary) hypertension: Secondary | ICD-10-CM

## 2017-06-28 LAB — POCT INR: INR: 2.5

## 2017-06-28 MED ORDER — PHENTERMINE HCL 37.5 MG PO TABS: ORAL_TABLET | ORAL | 0 refills | Status: DC

## 2017-06-28 NOTE — Assessment & Plan Note (Addendum)
Coumadin check today, INR is within range at 2.5, no change in Coumadin dosing.

## 2017-06-28 NOTE — Progress Notes (Addendum)
Subjective:    CC: Follow-up  HPI: Obesity: Fantastic weight loss, 14 pounds over the last month, no adverse effects.  Atrial fibrillation: INR is 2.5, no changes needed.  Hypertension: Blood pressure regimen clarified, numbers look great.  I reviewed the past medical history, family history, social history, surgical history, and allergies today and no changes were needed.  Please see the problem list section below in epic for further details.  Past Medical History: Past Medical History:  Diagnosis Date  . Atrial fibrillation ([place])   . CAD (coronary artery disease)    Stent placed; [place]  . Congestive heart failure (CHF) ()   . DVT (deep vein thrombosis) in pregnancy ([place])   . High cholesterol   . Hypertension   . OSA (obstructive sleep apnea) 03/21/2017  . Pulmonary embolus [facility])    Past Surgical History: Past Surgical History:  Procedure Laterality Date  . HERNIA REPAIR    . IVC  2014  . lamenectomy    . left orchiectomy     Social History: Social History   Socioeconomic History  . Marital status: Married    Spouse name: None  . Number of children: 5  . Years of education: None  . Highest education level: None  Social Needs  . Financial resource strain: None  . Food insecurity - worry: None  . Food insecurity - inability: None  . Transportation needs - medical: None  . Transportation needs - non-medical: None  Occupational History  . None  Tobacco Use  . Smoking status: Former Smoker    Last attempt to quit: 08/10/1999    Years since quitting: 17.8  . Smokeless tobacco: Never Used  Substance and Sexual Activity  . Alcohol use: Yes    Alcohol/week: 0.0 oz    Comment: Rare  . Drug use: No  . Sexual activity: Yes    Partners: Female  Other Topics Concern  . None  Social History Narrative  . None   Family History: Family History  Problem Relation Age of Onset  . Thyroid cancer Mother   . Depression Mother   . High Cholesterol  Father   . High blood pressure Father   . Heart disease Father        Atrial fibrillation  . Stroke Unknown        Grandfather   Allergies: Allergies  Allergen Reactions  . Lipitor [Atorvastatin]     myalgias   Medications: See med rec.  Review of Systems: No fevers, chills, night sweats, weight loss, chest pain, or shortness of breath.   Objective:    General: Well Developed, well nourished, and in no acute distress.  Neuro: Alert and oriented x3, extra-ocular muscles intact, sensation grossly intact.  HEENT: Normocephalic, atraumatic, pupils equal round reactive to light, neck supple, no masses, no lymphadenopathy, thyroid nonpalpable.  Skin: Warm and dry, no rashes. Cardiac: Regular rate and rhythm, no murmurs rubs or gallops, no lower extremity edema.  Respiratory: Clear to auscultation bilaterally. Not using accessory muscles, speaking in full sentences.  Impression and Recommendations:    Morbid obesity ([place]) Per patient report we are actually entering the fourth month now, he lost an additional 14 pounds over the last month. Also working towards bariatric surgery.  Essential hypertension Well-controlled, no changes needed.  Paroxysmal atrial fibrillation (H[facility]) Coumadin check today, INR is within range at 2.5, no change in Coumadin dosing.  OSA (obstructive sleep apnea) Patient tells me he does not have sleep apnea and  is not using his CPAP. Risks, benefits explained. ___________________________________________ Gwen Her. Dianah Field, M.D., ABFM., CAQSM. Primary Care and [place] Instructor of [place] [employer]

## 2017-06-28 NOTE — Assessment & Plan Note (Signed)
Well-controlled, no changes needed 

## 2017-06-28 NOTE — Assessment & Plan Note (Signed)
Patient tells me he does not have sleep apnea and is not using his CPAP. Risks, benefits explained.

## 2017-06-28 NOTE — Assessment & Plan Note (Signed)
Per patient report we are actually entering the fourth month now, he lost an additional 14 pounds over the last month. Also working towards bariatric surgery.

## 2017-07-03 ENCOUNTER — Ambulatory Visit (INDEPENDENT_AMBULATORY_CARE_PROVIDER_SITE_OTHER): Payer: Medicare Other | Admitting: Physical Therapy

## 2017-07-03 DIAGNOSIS — M6281 Muscle weakness (generalized): Secondary | ICD-10-CM

## 2017-07-03 DIAGNOSIS — M545 Low back pain, unspecified: Secondary | ICD-10-CM

## 2017-07-03 DIAGNOSIS — G8929 Other chronic pain: Secondary | ICD-10-CM

## 2017-07-03 DIAGNOSIS — R2689 Other abnormalities of gait and mobility: Secondary | ICD-10-CM

## 2017-07-03 DIAGNOSIS — M25512 Pain in left shoulder: Secondary | ICD-10-CM

## 2017-07-03 NOTE — Therapy (Signed)
[place], [place], [address] Phone: [phone]   Fax:  [phone]  Physical Therapy Treatment  Patient Details  Name: Jeff Green MRN: [id number] Date of Birth: [birth year] Referring Provider: Dr Dianah Field   Encounter Date: 07/03/2017  PT End of Session - 07/03/17 1013    Visit Number  13    Number of Visits  17    Date for PT Re-Evaluation  08/01/17    PT Start Time  1448    PT Stop Time  1055    PT Time Calculation (min)  41 min    Activity Tolerance  Patient tolerated treatment well       Past Medical History:  Diagnosis Date  . Atrial fibrillation ([place])   . CAD (coronary artery disease)    Stent placed; [place]  . Congestive heart failure (CHF) ([place])   . DVT (deep vein thrombosis) in pregnancy ([place])   . High cholesterol   . Hypertension   . OSA (obstructive sleep apnea) 03/21/2017  . Pulmonary embolus [facility])     Past Surgical History:  Procedure Laterality Date  . HERNIA REPAIR    . IVC  2014  . lamenectomy    . left orchiectomy      There were no vitals filed for this visit.  Subjective Assessment - 07/03/17 1014    Subjective  Jeff Green reports he did his stairs at the apartment for exercise the last couple of days and his knees/quads, shoulder status quo    Diagnostic tests  no x-rays used dx [place] and it shows arthritis, nothing was said about tears.     Patient Stated Goals   work on balance and feeling stable in standing.     Currently in Pain?  Yes    Pain Score  4     Pain Location  Back    Pain Orientation  Lower;Mid    Pain Descriptors / Indicators  Aching    Pain Type  Chronic pain    Pain Onset  More than a month ago    Pain Frequency  Intermittent    Aggravating Factors   all the stairs the other day.     Pain Relieving Factors  muscle rub                      [facility] Adult PT Treatment/Exercise - 07/03/17 0001      Neuro Re-ed     Neuro Re-ed Details   , then tandem walking on half bolster      Lumbar Exercises: Stretches   Single Knee to Chest Stretch  30 seconds with strap    Lower Trunk Rotation  3 reps;30 seconds    Hip Flexor Stretch  Left;Right in half kneel      Lumbar Exercises: Supine   Bridge  20 reps then with figure 4's    Straight Leg Raise  20 reps each side VC for form      Lumbar Exercises: Quadruped   Other Quadruped Lumbar Exercises  in half kneel for hip flexor stretch, then with arm movements for core work       Shoulder Exercises: Pulleys   Flexion  3 minutes                  PT Long Term Goals - 06/25/17 0001      PT LONG TERM GOAL #1   Title  I with HEP for  shoulder and balance ( 10/01/17)     Status  Partially Met      PT LONG TERM GOAL #2   Title  improve DGI =/> 22/24 to decrease risk of falls ( 08/01/17)     Status  On-going      PT LONG TERM GOAL #6   Title  pt able to stand in static position with minimal to no feelings of instabilty for 10' ( 08/01/17)     Status  On-going      PT LONG TERM GOAL #7   Title  improve Rt hip abduction strength =/> 5-/5 to assist with normalized gait ( 08/01/17)     Status  On-going            Plan - 07/03/17 1056    Clinical Impression Statement  Jeff Green reports he was painfree at end of treatment, he loosened up a lot with the exercise and stretches.  continues to make progress to his goals.  Still has some shaking in his Legs with standing activities that doesn't have a pattern.     Rehab Potential  Good    PT Frequency  1x / week    PT Duration  6 weeks    PT Treatment/Interventions  Gait training;Neuromuscular re-education;Dry needling;Manual techniques;Moist Heat;Ultrasound;Therapeutic activities;Patient/family education;Therapeutic exercise;Cryotherapy;Electrical Stimulation;Passive range of motion    PT Next Visit Plan  focus on balance, core stability,        Patient will benefit from skilled therapeutic  intervention in order to improve the following deficits and impairments:  Abnormal gait, Pain, Increased muscle spasms, Impaired UE functional use, Decreased strength, Decreased range of motion, Decreased balance, Difficulty walking  Visit Diagnosis: Muscle weakness (generalized)  Chronic bilateral low back pain without sciatica  Other abnormalities of gait and mobility  Chronic left shoulder pain     Problem List Patient Active Problem List   Diagnosis Date Noted  . Seborrheic keratosis 05/29/2017  . Insomnia 04/05/2017  . OSA (obstructive sleep apnea) 03/21/2017  . Primary osteoarthritis of left shoulder 11/28/2016  . Diabetes mellitus type 2, diet-controlled ([place]) 06/08/2016  . Seborrheic dermatitis 06/08/2016  . Morbid obesity ([place]) 06/08/2016  . Ruptured tympanic membrane, bilateral 01/27/2016  . Annual physical exam 01/25/2016  . Paroxysmal atrial fibrillation ([place]) 06/15/2015  . Polycythemia, secondary 08/26/2014  . Hemochromatosis 08/26/2014  . Multiple pulmonary nodules 08/16/2014  . Testicular cancer ([place]) 08/12/2014  . Hyperlipidemia 08/10/2014  . Hypogonadism in male 08/10/2014  . Essential hypertension 08/10/2014  . Spinal stenosis of lumbar region 08/10/2014  . Recurrent pulmonary emboli ([place]) 08/10/2014  . CAD (coronary artery disease), native coronary artery 08/10/2014    [name] PT  07/03/2017, 10:58 AM  [facility] [place], [place], [address] Phone: [phone]   Fax:  [phone]  Name: Jeff Green MRN: [id number] Date of Birth: [birth year]

## 2017-07-10 ENCOUNTER — Ambulatory Visit (INDEPENDENT_AMBULATORY_CARE_PROVIDER_SITE_OTHER): Payer: Medicare Other | Admitting: Physical Therapy

## 2017-07-10 DIAGNOSIS — M545 Low back pain, unspecified: Secondary | ICD-10-CM

## 2017-07-10 DIAGNOSIS — G8929 Other chronic pain: Secondary | ICD-10-CM | POA: Diagnosis not present

## 2017-07-10 DIAGNOSIS — M6281 Muscle weakness (generalized): Secondary | ICD-10-CM

## 2017-07-10 DIAGNOSIS — R2689 Other abnormalities of gait and mobility: Secondary | ICD-10-CM | POA: Diagnosis not present

## 2017-07-10 NOTE — Therapy (Signed)
[place], [place], [address] Phone: [phone]   Fax:  [phone]  Physical Therapy Treatment  Patient Details  Name: Jeff Green MRN: [id number] Date of Birth: Oct 20, 1955 Referring Provider: Dr Dianah Field   Encounter Date: 07/10/2017  PT End of Session - 07/10/17 1019    Visit Number  14 pt was in early to use pulleys to loosen up shoulder    Number of Visits  17    Date for PT Re-Evaluation  08/01/17    PT Start Time  1019    PT Stop Time  1050    PT Time Calculation (min)  31 min    Activity Tolerance  Patient tolerated treatment well       Past Medical History:  Diagnosis Date  . Atrial fibrillation ([place])   . CAD (coronary artery disease)    Stent placed; [place]  . Congestive heart failure (CHF) ([place])   . DVT (deep vein thrombosis) in pregnancy ([place])   . High cholesterol   . Hypertension   . OSA (obstructive sleep apnea) 03/21/2017  . Pulmonary embolus [facility])     Past Surgical History:  Procedure Laterality Date  . HERNIA REPAIR    . IVC  2014  . lamenectomy    . left orchiectomy      There were no vitals filed for this visit.  Subjective Assessment - 07/10/17 1019    Subjective  Rich with a lot of shoulder pain Lt - awaiting ortho MD to call him back.  He is not sure how much improvement he is having in his balance, having some increased numbness in Lt  Lower leg.          [facility] PT Assessment - 07/10/17 0001      Assessment   Medical Diagnosis   lumbar spondylosis and gait instability      Strength   Right/Left Hip  Left;Right    Right Hip Flexion  5/5    Right Hip Extension  5/5    Right Hip ABduction  4+/5    Left Hip Flexion  5/5    Left Hip Extension  5/5    Left Hip ABduction  5/5                  [facility] Adult PT Treatment/Exercise - 07/10/17 0001      Neuro Re-ed    Neuro Re-ed Details   SLS, one HHA opposite leg swings -  much more difficult in Rt LE/       Lumbar Exercises: Standing   Other Standing Lumbar Exercises  20 reps single leg step ups onto rebounder,one HHA       Lumbar Exercises: Supine   Other Supine Lumbar Exercises  table top with knee flex/ext with TA contraction      Lumbar Exercises: Sidelying   Other Sidelying Lumbar Exercises  20 reps Rt hip pilates FWD/BWD kicks, CW/CCW cirlces                  PT Long Term Goals - 07/10/17 0001      PT LONG TERM GOAL #1   Title  I with HEP for shoulder and balance ( 10/01/17)     Status  Partially Met      PT LONG TERM GOAL #3   Title  demo improved Lt shoulder motion to allow him to consistently reach to the top shelf (06/07/17)  Status  Achieved in the past      PT LONG TERM GOAL #6   Title  pt able to stand in static position with minimal to no feelings of instabilty for 10' ( 08/01/17)  lasts >5 min before he feels unstable     Status  On-going      PT LONG TERM GOAL #7   Title  improve Rt hip abduction strength =/> 5-/5 to assist with normalized gait ( 08/01/17)     Status  Partially Met            Plan - 07/10/17 1041    Clinical Impression Statement  Rich has improved strength in the Rt hip, still weak with hip abduction/glut med.  He is concerned that he still has the feelings of instability through the Lt quad/knee and turns out bilat feet.  Discussed possible orthotics to reposition feet into neutral position. He has partially met another goal..  He is Lt shoulder is back to hurting a lot, this is no longer in his plan of care and he is waiting on ortho MD to call and discuss options.     Rehab Potential  Good    PT Frequency  1x / week    PT Duration  6 weeks    PT Treatment/Interventions  Gait training;Neuromuscular re-education;Dry needling;Manual techniques;Moist Heat;Ultrasound;Therapeutic activities;Patient/family education;Therapeutic exercise;Cryotherapy;Electrical Stimulation;Passive range of motion    PT  Next Visit Plan  focus on balance, core stability,     Consulted and Agree with Plan of Care  Patient       Patient will benefit from skilled therapeutic intervention in order to improve the following deficits and impairments:  Abnormal gait, Pain, Increased muscle spasms, Impaired UE functional use, Decreased strength, Decreased range of motion, Decreased balance, Difficulty walking  Visit Diagnosis: Muscle weakness (generalized)  Chronic bilateral low back pain without sciatica  Other abnormalities of gait and mobility     Problem List Patient Active Problem List   Diagnosis Date Noted  . Seborrheic keratosis 05/29/2017  . Insomnia 04/05/2017  . OSA (obstructive sleep apnea) 03/21/2017  . Primary osteoarthritis of left shoulder 11/28/2016  . Diabetes mellitus type 2, diet-controlled ([place]) 06/08/2016  . Seborrheic dermatitis 06/08/2016  . Morbid obesity ([place]) 06/08/2016  . Ruptured tympanic membrane, bilateral 01/27/2016  . Annual physical exam 01/25/2016  . Paroxysmal atrial fibrillation ([place]) 06/15/2015  . Polycythemia, secondary 08/26/2014  . Hemochromatosis 08/26/2014  . Multiple pulmonary nodules 08/16/2014  . Testicular cancer ([place]) 08/12/2014  . Hyperlipidemia 08/10/2014  . Hypogonadism in male 08/10/2014  . Essential hypertension 08/10/2014  . Spinal stenosis of lumbar region 08/10/2014  . Recurrent pulmonary emboli ([place]) 08/10/2014  . CAD (coronary artery disease), native coronary artery 08/10/2014    Boneta Lucks rPT  07/10/2017, 10:51 AM  [facility] [place], [place], [address] Phone: [phone]   Fax:  [phone]  Name: Kaven Cumbie MRN: [id number] Date of Birth: [birth year]

## 2017-07-12 DIAGNOSIS — M25512 Pain in left shoulder: Secondary | ICD-10-CM | POA: Diagnosis not present

## 2017-07-12 DIAGNOSIS — Z87891 Personal history of nicotine dependence: Secondary | ICD-10-CM | POA: Diagnosis not present

## 2017-07-12 DIAGNOSIS — Z79899 Other long term (current) drug therapy: Secondary | ICD-10-CM | POA: Diagnosis not present

## 2017-07-12 DIAGNOSIS — M19012 Primary osteoarthritis, left shoulder: Secondary | ICD-10-CM | POA: Diagnosis not present

## 2017-07-12 DIAGNOSIS — I4891 Unspecified atrial fibrillation: Secondary | ICD-10-CM | POA: Diagnosis not present

## 2017-07-12 DIAGNOSIS — I10 Essential (primary) hypertension: Secondary | ICD-10-CM | POA: Diagnosis not present

## 2017-07-12 DIAGNOSIS — G8929 Other chronic pain: Secondary | ICD-10-CM | POA: Diagnosis not present

## 2017-07-12 DIAGNOSIS — E7800 Pure hypercholesterolemia, unspecified: Secondary | ICD-10-CM | POA: Diagnosis not present

## 2017-07-12 DIAGNOSIS — Z888 Allergy status to other drugs, medicaments and biological substances status: Secondary | ICD-10-CM | POA: Diagnosis not present

## 2017-07-12 DIAGNOSIS — Z7901 Long term (current) use of anticoagulants: Secondary | ICD-10-CM | POA: Diagnosis not present

## 2017-07-17 ENCOUNTER — Encounter: Payer: Self-pay | Admitting: Physical Therapy

## 2017-07-17 ENCOUNTER — Ambulatory Visit (INDEPENDENT_AMBULATORY_CARE_PROVIDER_SITE_OTHER): Payer: Medicare Other | Admitting: Physical Therapy

## 2017-07-17 ENCOUNTER — Other Ambulatory Visit: Payer: Self-pay | Admitting: Sports Medicine

## 2017-07-17 DIAGNOSIS — M6281 Muscle weakness (generalized): Secondary | ICD-10-CM

## 2017-07-17 DIAGNOSIS — M545 Low back pain, unspecified: Secondary | ICD-10-CM

## 2017-07-17 DIAGNOSIS — G8929 Other chronic pain: Secondary | ICD-10-CM | POA: Diagnosis not present

## 2017-07-17 DIAGNOSIS — R2689 Other abnormalities of gait and mobility: Secondary | ICD-10-CM | POA: Diagnosis not present

## 2017-07-17 NOTE — Therapy (Addendum)
[place], [place], [address] Phone: [phone]   Fax:  [phone]  Physical Therapy Treatment  Patient Details  Name: Jeff Green MRN: [id number] Date of Birth: 04-Aug-1955 Referring Provider: Dr Dianah Field   Encounter Date: 07/17/2017  PT End of Session - 07/17/17 0932    Visit Number  15    Number of Visits  17    Date for PT Re-Evaluation  08/01/17    PT Start Time  0932    PT Stop Time  1010    PT Time Calculation (min)  38 min    Activity Tolerance  Patient limited by fatigue       Past Medical History:  Diagnosis Date  . Atrial fibrillation ([place])   . CAD (coronary artery disease)    Stent placed; [place]  . Congestive heart failure (CHF) ([place])   . DVT (deep vein thrombosis) in pregnancy ([place])   . High cholesterol   . Hypertension   . OSA (obstructive sleep apnea) 03/21/2017  . Pulmonary embolus [facility])     Past Surgical History:  Procedure Laterality Date  . HERNIA REPAIR    . IVC  2014  . lamenectomy    . left orchiectomy      There were no vitals filed for this visit.  Subjective Assessment - 07/17/17 0932    Subjective  Rich reports that his Lt shoulder is really bothering him, he sees the surgeon next Wed. Heat really aggrivated it yesterday. Sees foot MD on Monday and hopefully getting some shoe support    Patient Stated Goals   work on balance and feeling stable in standing.     Currently in Pain?  Yes    Pain Score  8     Pain Location  Shoulder         [facility] PT Assessment - 07/17/17 0001      Assessment   Medical Diagnosis   lumbar spondylosis and gait instability      Dynamic Gait Index   Level Surface  Mild Impairment    Change in Gait Speed  Mild Impairment small speed change, however smoother walk    Gait with Horizontal Head Turns  Normal    Gait with Vertical Head Turns  Normal    Gait and Pivot Turn  Normal    Step Over  Obstacle  Normal    Step Around Obstacles  Normal    Steps  Normal    Total Score  22            No data recorded       [facility] Adult PT Treatment/Exercise - 07/17/17 0001      Lumbar Exercises: Stretches   Passive Hamstring Stretch  Left;Right;30 seconds    ITB Stretch  Left;Right;30 seconds      Lumbar Exercises: Standing   Other Standing Lumbar Exercises  20 reps step ups onto 10" step      Lumbar Exercises: Supine   Bridge  Non-compliant;20 reps feet on ball    Other Supine Lumbar Exercises  TA contraction with single leg presses, VC for form, the frog kicks un supine with legs up                  PT Long Term Goals - 07/17/17 0001      PT LONG TERM GOAL #1   Title  I with HEP for shoulder and balance ( 10/01/17)  Status  Partially Met      PT LONG TERM GOAL #2   Title  improve DGI =/> 22/24 to decrease risk of falls ( 08/01/17)     Status  Achieved      PT LONG TERM GOAL #3   Title  demo improved Lt shoulder motion to allow him to consistently reach to the top shelf (06/07/17)      Status  Achieved      PT LONG TERM GOAL #6   Title  pt able to stand in static position with minimal to no feelings of instabilty for 10' ( 08/01/17)     Status  On-going not sure if this is any better            Plan - 07/17/17 0940    Clinical Impression Statement  Rich's shoulder is giving him a really hard time this week - seeing surgeon next week.  His balance is slowly improving and he has met another goal for this.  He still reports LE shaking with static stand.  Rich's legs fatigued with todays tx.     Rehab Potential  Good    PT Frequency  1x / week    PT Duration  6 weeks    PT Treatment/Interventions  Gait training;Neuromuscular re-education;Dry needling;Manual techniques;Moist Heat;Ultrasound;Therapeutic activities;Patient/family education;Therapeutic exercise;Cryotherapy;Electrical Stimulation;Passive range of motion    PT Next Visit Plan  focus on  balance, core stability,     Consulted and Agree with Plan of Care  Patient       Patient will benefit from skilled therapeutic intervention in order to improve the following deficits and impairments:  Abnormal gait, Pain, Increased muscle spasms, Impaired UE functional use, Decreased strength, Decreased range of motion, Decreased balance, Difficulty walking  Visit Diagnosis: Muscle weakness (generalized)  Chronic bilateral low back pain without sciatica  Other abnormalities of gait and mobility     Problem List Patient Active Problem List   Diagnosis Date Noted  . Seborrheic keratosis 05/29/2017  . Insomnia 04/05/2017  . OSA (obstructive sleep apnea) 03/21/2017  . Primary osteoarthritis of left shoulder 11/28/2016  . Diabetes mellitus type 2, diet-controlled ([place]) 06/08/2016  . Seborrheic dermatitis 06/08/2016  . Morbid obesity ([place]) 06/08/2016  . Ruptured tympanic membrane, bilateral 01/27/2016  . Annual physical exam 01/25/2016  . Paroxysmal atrial fibrillation ([place]) 06/15/2015  . Polycythemia, secondary 08/26/2014  . Hemochromatosis 08/26/2014  . Multiple pulmonary nodules 08/16/2014  . Testicular cancer ([place]) 08/12/2014  . Hyperlipidemia 08/10/2014  . Hypogonadism in male 08/10/2014  . Essential hypertension 08/10/2014  . Spinal stenosis of lumbar region 08/10/2014  . Recurrent pulmonary emboli ([place]) 08/10/2014  . CAD (coronary artery disease), native coronary artery 08/10/2014    Manuela Schwartz Aydyn Testerman PT  07/17/2017, 10:06 AM  [facility] [place], [place], [address] Phone: [phone]   Fax:  [phone]  Name: Jeramia Saleeby MRN: [id number] Date of Birth: Dec 24, 1955   PHYSICAL THERAPY DISCHARGE SUMMARY  Visits from Start of Care: 17 Current functional level related to goals / functional outcomes: Patients function of the Lt UE fluctuates he is being referred to an ortho surgeon.  His  back/balance continues to be an issue.    Remaining deficits: Balance and LE "tremors" Education / Equipment: HEP Plan: Patient agrees to discharge.  Patient goals were partially met. Patient is being discharged due to not returning since the last visit.  ?????    [name], PT 09/05/17 12:33 PM

## 2017-07-22 ENCOUNTER — Other Ambulatory Visit: Payer: Self-pay | Admitting: Sports Medicine

## 2017-07-22 ENCOUNTER — Ambulatory Visit: Payer: Self-pay | Admitting: Podiatry

## 2017-07-23 ENCOUNTER — Telehealth: Payer: Self-pay | Admitting: Sports Medicine

## 2017-07-23 ENCOUNTER — Ambulatory Visit (INDEPENDENT_AMBULATORY_CARE_PROVIDER_SITE_OTHER): Payer: Medicare Other | Admitting: Sports Medicine

## 2017-07-23 ENCOUNTER — Encounter: Payer: Medicare Other | Admitting: Physical Therapy

## 2017-07-23 DIAGNOSIS — M19012 Primary osteoarthritis, left shoulder: Secondary | ICD-10-CM | POA: Diagnosis not present

## 2017-07-23 NOTE — Progress Notes (Signed)
Subjective:    CC: Left shoulder pain  HPI: This is a pleasant 62 year old male with known left shoulder osteoarthritis, previous injection was 2 months ago, worked until a couple of days ago. He does have an appointment tomorrow with Dr. Tamera Punt to discuss shoulder arthroplasty but has severe pain and desires an early injection today. Pain is severe, localized at the joint line without radiation.  I reviewed the past medical history, family history, social history, surgical history, and allergies today and no changes were needed.  Please see the problem list section below in epic for further details.  Past Medical History: Past Medical History:  Diagnosis Date  . Atrial fibrillation ([place])   . CAD (coronary artery disease)    Stent placed; [place]  . Congestive heart failure (CHF) ([place])   . DVT (deep vein thrombosis) in pregnancy ([place])   . High cholesterol   . Hypertension   . OSA (obstructive sleep apnea) 03/21/2017  . Pulmonary embolus [facility])    Past Surgical History: Past Surgical History:  Procedure Laterality Date  . HERNIA REPAIR    . IVC  2014  . lamenectomy    . left orchiectomy     Social History: Social History   Socioeconomic History  . Marital status: Married    Spouse name: Not on file  . Number of children: 5  . Years of education: Not on file  . Highest education level: Not on file  Occupational History  . Not on file  Social Needs  . Financial resource strain: Not on file  . Food insecurity:    Worry: Not on file    Inability: Not on file  . Transportation needs:    Medical: Not on file    Non-medical: Not on file  Tobacco Use  . Smoking status: Former Smoker    Last attempt to quit: 08/10/1999    Years since quitting: 17.9  . Smokeless tobacco: Never Used  Substance and Sexual Activity  . Alcohol use: Yes    Alcohol/week: 0.0 oz    Comment: Rare  . Drug use: No  . Sexual activity: Yes    Partners: Female  Lifestyle  .  Physical activity:    Days per week: Not on file    Minutes per session: Not on file  . Stress: Not on file  Relationships  . Social connections:    Talks on phone: Not on file    Gets together: Not on file    Attends religious service: Not on file    Active member of club or organization: Not on file    Attends meetings of clubs or organizations: Not on file    Relationship status: Not on file  Other Topics Concern  . Not on file  Social History Narrative  . Not on file   Family History: Family History  Problem Relation Age of Onset  . Thyroid cancer Mother   . Depression Mother   . High Cholesterol Father   . High blood pressure Father   . Heart disease Father        Atrial fibrillation  . Stroke Unknown        Grandfather   Allergies: Allergies  Allergen Reactions  . Lipitor [Atorvastatin]     myalgias   Medications: See med rec.  Review of Systems: No fevers, chills, night sweats, weight loss, chest pain, or shortness of breath.   Objective:    General: Well Developed, well nourished, and in no  acute distress.  Neuro: Alert and oriented x3, extra-ocular muscles intact, sensation grossly intact.  HEENT: Normocephalic, atraumatic, pupils equal round reactive to light, neck supple, no masses, no lymphadenopathy, thyroid nonpalpable.  Skin: Warm and dry, no rashes. Cardiac: Regular rate and rhythm, no murmurs rubs or gallops, no lower extremity edema.  Respiratory: Clear to auscultation bilaterally. Not using accessory muscles, speaking in full sentences. Left Shoulder: Inspection reveals no abnormalities, atrophy or asymmetry. Palpation is normal with no tenderness over AC joint or bicipital groove. ROM is full in all planes. Rotator cuff strength weak throughout. No signs of impingement with negative Neer and Hawkin's tests, empty can. Speeds and Yergason's tests normal. Positive Obrien's, negative crank, negative clunk, and good stability. Normal scapular  function observed. No painful arc and no drop arm sign. No apprehension sign  Procedure: Real-time Ultrasound Guided Injection of left glenohumeral joint Device: GE Logiq E  Verbal informed consent obtained.  Time-out conducted.  Noted no overlying erythema, induration, or other signs of local infection.  Skin prepped in a sterile fashion.  Local anesthesia: Topical Ethyl chloride.  With sterile technique and under real time ultrasound guidance:  Noted glenohumeral effusion, using a posterior approach advanced into the joint and injected 1 mL Depo-Medrol 40, 2 mL lidocaine, 2 mL bupivacaine. Completed without difficulty  Pain immediately resolved suggesting accurate placement of the medication.  Advised to call if fevers/chills, erythema, induration, drainage, or persistent bleeding.  Images permanently stored and available for review in the ultrasound unit.  Impression: Technically successful ultrasound guided injection.  Impression and Recommendations:    Primary osteoarthritis of left shoulder Repeat glenohumeral injection. Next line he does have an appointment with Dr. Tamera Punt tomorrow, he understands that the injection today was a little bit early. We used Depo-Medrol instead.  ___________________________________________ Gwen Her. Dianah Field, M.D., ABFM., CAQSM. Primary Care and [place] Instructor of [place] of [facility] of Medicine

## 2017-07-23 NOTE — Assessment & Plan Note (Signed)
Repeat glenohumeral injection. Next line he does have an appointment with Dr. Tamera Punt tomorrow, he understands that the injection today was a little bit early. We used Depo-Medrol instead.

## 2017-07-23 NOTE — Telephone Encounter (Signed)
Patient came in stating that he has been in severe pain with his left shoulder since Sunday and is in need of an injection as soon as possible. He asked to be seen today, but I informed him that your schedule is full this afternoon. Is there anyway he could be double booked for a quick injection? Please let me know. Thanks!

## 2017-07-23 NOTE — Telephone Encounter (Signed)
On further investigation of his chart his last injection was only 2 months ago, I can do a half dose injection but we need to discuss further plans and re-explain to him the limit on how frequently I can do these injections.

## 2017-07-25 DIAGNOSIS — M19012 Primary osteoarthritis, left shoulder: Secondary | ICD-10-CM | POA: Diagnosis not present

## 2017-07-26 ENCOUNTER — Ambulatory Visit (INDEPENDENT_AMBULATORY_CARE_PROVIDER_SITE_OTHER): Payer: Medicare Other | Admitting: Sports Medicine

## 2017-07-26 ENCOUNTER — Encounter: Payer: Self-pay | Admitting: Sports Medicine

## 2017-07-26 VITALS — BP 115/80 | HR 86 | Resp 18 | Wt 303.0 lb

## 2017-07-26 DIAGNOSIS — I480 Paroxysmal atrial fibrillation: Secondary | ICD-10-CM

## 2017-07-26 DIAGNOSIS — M19012 Primary osteoarthritis, left shoulder: Secondary | ICD-10-CM

## 2017-07-26 DIAGNOSIS — Z7901 Long term (current) use of anticoagulants: Secondary | ICD-10-CM | POA: Diagnosis not present

## 2017-07-26 LAB — POCT INR: INR: 4.2

## 2017-07-26 MED ORDER — PHENTERMINE HCL 37.5 MG PO TABS: ORAL_TABLET | ORAL | 0 refills | Status: DC

## 2017-07-26 MED ORDER — OXYCODONE-ACETAMINOPHEN 10-325 MG PO TABS: 1.0000 | ORAL_TABLET | Freq: Three times a day (TID) | ORAL | 0 refills | Status: DC | PRN

## 2017-07-26 NOTE — Progress Notes (Signed)
Subjective:    CC: Follow-up  HPI: Atrial fibrillation: INR typically runs in the normal range, today it was over 4, no bleeding, he has been eating significantly less trying to lose weight.  Left shoulder osteoarthritis: I gave him an injection a couple of days ago, Depo-Medrol so we do not expect an onset just yet, he did also touch base with shoulder surgery, they as expected have recommended total shoulder arthroplasty.  Unfortunately his pain is still severe, he does have a pain management provider, but his current dosage of hydrocodone 10/325 is not effective.  I have agreed to provide a short course of oxycodone 10/325 and contact his pain provider regarding his acute on chronic flare in pain.  I also told him we would not be doing this regularly.  Obesity: Due for refill on medications, good weight loss.  He is also working to establish with bariatric surgery, I do think he should proceed with gastric sleeve.  I reviewed the past medical history, family history, social history, surgical history, and allergies today and no changes were needed.  Please see the problem list section below in epic for further details.  Past Medical History: Past Medical History:  Diagnosis Date  . Atrial fibrillation ([place])   . CAD (coronary artery disease)    Stent placed; [place]  . Congestive heart failure (CHF) ([place])   . DVT (deep vein thrombosis) in pregnancy ([place])   . High cholesterol   . Hypertension   . OSA (obstructive sleep apnea) 03/21/2017  . Pulmonary embolus [facility])    Past Surgical History: Past Surgical History:  Procedure Laterality Date  . HERNIA REPAIR    . IVC  2014  . lamenectomy    . left orchiectomy     Social History: Social History   Socioeconomic History  . Marital status: Married    Spouse name: Not on file  . Number of children: 5  . Years of education: Not on file  . Highest education level: Not on file  Occupational History  . Not on file    Social Needs  . Financial resource strain: Not on file  . Food insecurity:    Worry: Not on file    Inability: Not on file  . Transportation needs:    Medical: Not on file    Non-medical: Not on file  Tobacco Use  . Smoking status: Former Smoker    Last attempt to quit: 08/10/1999    Years since quitting: 17.9  . Smokeless tobacco: Never Used  Substance and Sexual Activity  . Alcohol use: Yes    Alcohol/week: 0.0 oz    Comment: Rare  . Drug use: No  . Sexual activity: Yes    Partners: Female  Lifestyle  . Physical activity:    Days per week: Not on file    Minutes per session: Not on file  . Stress: Not on file  Relationships  . Social connections:    Talks on phone: Not on file    Gets together: Not on file    Attends religious service: Not on file    Active member of club or organization: Not on file    Attends meetings of clubs or organizations: Not on file    Relationship status: Not on file  Other Topics Concern  . Not on file  Social History Narrative  . Not on file   Family History: Family History  Problem Relation Age of Onset  . Thyroid cancer Mother   .  Depression Mother   . High Cholesterol Father   . High blood pressure Father   . Heart disease Father        Atrial fibrillation  . Stroke Unknown        Grandfather   Allergies: Allergies  Allergen Reactions  . Lipitor [Atorvastatin]     myalgias   Medications: See med rec.  Review of Systems: No fevers, chills, night sweats, weight loss, chest pain, or shortness of breath.   Objective:    General: Well Developed, well nourished, and in no acute distress.  Neuro: Alert and oriented x3, extra-ocular muscles intact, sensation grossly intact.  HEENT: Normocephalic, atraumatic, pupils equal round reactive to light, neck supple, no masses, no lymphadenopathy, thyroid nonpalpable.  Skin: Warm and dry, no rashes. Cardiac: Regular rate and rhythm, no murmurs rubs or gallops, no lower extremity  edema.  Respiratory: Clear to auscultation bilaterally. Not using accessory muscles, speaking in full sentences.  Impression and Recommendations:    Morbid obesity ([place]) Good continued weight loss, entering the fifth month, he is lost almost 20 pounds. He is also working towards bariatric surgery. Continue phentermine, Trulicity.  Primary osteoarthritis of left shoulder Not much better after injection but it was done just a couple of days ago so we do expect 4-7 days for onset of Depo-Medrol. He did touch base with Dr. Tamera Punt with shoulder surgery, they did recommend shoulder arthroplasty however he would like to wait till the end of the summer. Adding a short course of oxycodone 10 mg. We will forward this note to his pain provider [employer], who works with Dr. Vira Blanco.  Paroxysmal atrial fibrillation (HCC) INR is over 4 today, no signs of bleeding. Continue current regimen, we do need to recheck this in 1 week, he typically runs in the accepted range. ___________________________________________ Gwen Her. Dianah Field, M.D., ABFM., CAQSM. Primary Care and [place] Instructor of [place] of [facility] of Medicine

## 2017-07-26 NOTE — Assessment & Plan Note (Signed)
INR is over 4 today, no signs of bleeding. Continue current regimen, we do need to recheck this in 1 week, he typically runs in the accepted range.

## 2017-07-26 NOTE — Assessment & Plan Note (Addendum)
Not much better after injection but it was done just a couple of days ago so we do expect 4-7 days for onset of Depo-Medrol. He did touch base with Dr. Tamera Punt with shoulder surgery, they did recommend shoulder arthroplasty however he would like to wait till the end of the summer. Adding a short course of oxycodone 10 mg. We will forward this note to his pain provider [employer], who works with Dr. Vira Blanco.

## 2017-07-26 NOTE — Assessment & Plan Note (Signed)
Good continued weight loss, entering the fifth month, he is lost almost 20 pounds. He is also working towards bariatric surgery. Continue phentermine, Trulicity.

## 2017-08-01 ENCOUNTER — Ambulatory Visit (INDEPENDENT_AMBULATORY_CARE_PROVIDER_SITE_OTHER): Payer: Medicare Other | Admitting: Sports Medicine

## 2017-08-01 ENCOUNTER — Encounter: Payer: Self-pay | Admitting: Sports Medicine

## 2017-08-01 DIAGNOSIS — I480 Paroxysmal atrial fibrillation: Secondary | ICD-10-CM

## 2017-08-01 DIAGNOSIS — I2699 Other pulmonary embolism without acute cor pulmonale: Secondary | ICD-10-CM | POA: Diagnosis not present

## 2017-08-01 LAB — POCT INR: INR: >4.5

## 2017-08-01 LAB — PROTIME-INR
INR: 6.9 — ABNORMAL HIGH
Prothrombin Time: 72.1 s — ABNORMAL HIGH (ref 9.0–11.5)

## 2017-08-01 NOTE — Progress Notes (Addendum)
Supratherapeutic, drop down to 1/2 tablet on Fridays and Mondays, 1 tablet on other days.  Recheck in 1 week.  Currently has 7.5mg  tabs.

## 2017-08-01 NOTE — Progress Notes (Signed)
Left message advising of recommendations.  

## 2017-08-01 NOTE — Addendum Note (Signed)
Addended by: Narda Rutherford on: 08/01/2017 10:12 AM   Modules accepted: Level of Service

## 2017-08-02 ENCOUNTER — Ambulatory Visit: Payer: Medicare Other

## 2017-08-06 ENCOUNTER — Other Ambulatory Visit: Payer: Self-pay | Admitting: Sports Medicine

## 2017-08-08 ENCOUNTER — Ambulatory Visit: Payer: Medicare Other

## 2017-08-12 ENCOUNTER — Ambulatory Visit (INDEPENDENT_AMBULATORY_CARE_PROVIDER_SITE_OTHER): Payer: Medicare Other | Admitting: Sports Medicine

## 2017-08-12 DIAGNOSIS — I480 Paroxysmal atrial fibrillation: Secondary | ICD-10-CM | POA: Diagnosis not present

## 2017-08-12 DIAGNOSIS — I2699 Other pulmonary embolism without acute cor pulmonale: Secondary | ICD-10-CM | POA: Diagnosis not present

## 2017-08-12 LAB — POCT INR: INR: 3.5

## 2017-08-12 NOTE — Progress Notes (Signed)
Supratherapeutic, drop down to 1/2 tablet on Mondays, Wednesdays, Fridays, 1 tablet on other days.  Recheck in 1 week.  Currently has 7.5mg  tabs.

## 2017-08-12 NOTE — Assessment & Plan Note (Signed)
Supratherapeutic, drop down to 1/2 tablet on Mondays, Wednesdays, Fridays, 1 tablet on other days.  Recheck in 1 week.  Currently has 7.5mg  tabs.

## 2017-08-13 NOTE — Progress Notes (Signed)
Left message advising of recommendations.  

## 2017-08-19 ENCOUNTER — Ambulatory Visit (INDEPENDENT_AMBULATORY_CARE_PROVIDER_SITE_OTHER): Payer: Medicare Other | Admitting: Sports Medicine

## 2017-08-19 DIAGNOSIS — I480 Paroxysmal atrial fibrillation: Secondary | ICD-10-CM

## 2017-08-19 DIAGNOSIS — I2699 Other pulmonary embolism without acute cor pulmonale: Secondary | ICD-10-CM

## 2017-08-19 LAB — POCT INR: INR: 2.5

## 2017-08-20 NOTE — Progress Notes (Signed)
Patient advised of recommendations.  

## 2017-08-23 ENCOUNTER — Ambulatory Visit (INDEPENDENT_AMBULATORY_CARE_PROVIDER_SITE_OTHER): Payer: Medicare Other | Admitting: Sports Medicine

## 2017-08-23 ENCOUNTER — Encounter: Payer: Self-pay | Admitting: Sports Medicine

## 2017-08-23 ENCOUNTER — Other Ambulatory Visit: Payer: Self-pay | Admitting: Sports Medicine

## 2017-08-23 DIAGNOSIS — E119 Type 2 diabetes mellitus without complications: Secondary | ICD-10-CM

## 2017-08-23 DIAGNOSIS — M19012 Primary osteoarthritis, left shoulder: Secondary | ICD-10-CM | POA: Diagnosis not present

## 2017-08-23 DIAGNOSIS — M48061 Spinal stenosis, lumbar region without neurogenic claudication: Secondary | ICD-10-CM

## 2017-08-23 DIAGNOSIS — I10 Essential (primary) hypertension: Secondary | ICD-10-CM

## 2017-08-23 MED ORDER — TOPIRAMATE 50 MG PO TABS: ORAL_TABLET | ORAL | 0 refills | Status: DC

## 2017-08-23 MED ORDER — PHENTERMINE HCL 37.5 MG PO TABS: ORAL_TABLET | ORAL | 0 refills | Status: DC

## 2017-08-23 NOTE — Assessment & Plan Note (Signed)
Only 2 pound weight loss, entering the sixth month, he has lost about 20 pounds. Also working towards bariatric surgery. Refilling phentermine, Trulicity will be continued, adding Topamax.   Return in 1 month.

## 2017-08-23 NOTE — Progress Notes (Signed)
Labs ordered per radiology protocol  

## 2017-08-23 NOTE — Assessment & Plan Note (Signed)
We can do a half dose injection into his left glenohumeral joint at the next visit in about 2 weeks, previous injection was only about a month ago. I have already recommended that he touch base with Dr. Tamera Punt which shoulder surgery regarding shoulder arthroplasty. I did a short course of oxycodone last time but he knows that he needs to get further pain medication from his pain provider Vanice Sarah, PA-C who works with Dr. Vira Blanco.

## 2017-08-23 NOTE — Progress Notes (Signed)
Subjective:    CC: Follow-up  HPI: Left shoulder osteoarthritis: Too soon for another injection.  Lumbar spinal stenosis: Persistent pain with weakness now in the left thigh and the right ankle, history of single level decompression.  Obesity: Has lost 2 more pounds.  I reviewed the past medical history, family history, social history, surgical history, and allergies today and no changes were needed.  Please see the problem list section below in epic for further details.  Past Medical History: Past Medical History:  Diagnosis Date  . Atrial fibrillation ([place])   . CAD (coronary artery disease)    Stent placed; [place]  . Congestive heart failure (CHF) ([place])   . DVT (deep vein thrombosis) in pregnancy ([place])   . High cholesterol   . Hypertension   . OSA (obstructive sleep apnea) 03/21/2017  . Pulmonary embolus [facility])    Past Surgical History: Past Surgical History:  Procedure Laterality Date  . HERNIA REPAIR    . IVC  2014  . lamenectomy    . left orchiectomy     Social History: Social History   Socioeconomic History  . Marital status: Married    Spouse name: Not on file  . Number of children: 5  . Years of education: Not on file  . Highest education level: Not on file  Occupational History  . Not on file  Social Needs  . Financial resource strain: Not on file  . Food insecurity:    Worry: Not on file    Inability: Not on file  . Transportation needs:    Medical: Not on file    Non-medical: Not on file  Tobacco Use  . Smoking status: Former Smoker    Last attempt to quit: 08/10/1999    Years since quitting: 18.0  . Smokeless tobacco: Never Used  Substance and Sexual Activity  . Alcohol use: Yes    Alcohol/week: 0.0 oz    Comment: Rare  . Drug use: No  . Sexual activity: Yes    Partners: Female  Lifestyle  . Physical activity:    Days per week: Not on file    Minutes per session: Not on file  . Stress: Not on file  Relationships  .  Social connections:    Talks on phone: Not on file    Gets together: Not on file    Attends religious service: Not on file    Active member of club or organization: Not on file    Attends meetings of clubs or organizations: Not on file    Relationship status: Not on file  Other Topics Concern  . Not on file  Social History Narrative  . Not on file   Family History: Family History  Problem Relation Age of Onset  . Thyroid cancer Mother   . Depression Mother   . High Cholesterol Father   . High blood pressure Father   . Heart disease Father        Atrial fibrillation  . Stroke Unknown        Grandfather   Allergies: Allergies  Allergen Reactions  . Lipitor [Atorvastatin]     myalgias   Medications: See med rec.  Review of Systems: No fevers, chills, night sweats, weight loss, chest pain, or shortness of breath.   Objective:    General: Well Developed, well nourished, and in no acute distress.  Neuro: Alert and oriented x3, extra-ocular muscles intact, sensation grossly intact.  HEENT: Normocephalic, atraumatic, pupils equal round reactive  to light, neck supple, no masses, no lymphadenopathy, thyroid nonpalpable.  Skin: Warm and dry, no rashes. Cardiac: Regular rate and rhythm, no murmurs rubs or gallops, no lower extremity edema.  Respiratory: Clear to auscultation bilaterally. Not using accessory muscles, speaking in full sentences.   Impression and Recommendations:    Spinal stenosis of lumbar region Post single decompression, lumbar spinal stenosis, progressive weakness of the left thigh, with some unsteadiness in gait. I am going to go ahead and order an MRI with IV contrast. He needs a BMP.  Morbid obesity ([place]) Only 2 pound weight loss, entering the sixth month, he has lost about 20 pounds. Also working towards bariatric surgery. Refilling phentermine, Trulicity will be continued, adding Topamax.   Return in 1 month.  Primary osteoarthritis of left  shoulder We can do a half dose injection into his left glenohumeral joint at the next visit in about 2 weeks, previous injection was only about a month ago. I have already recommended that he touch base with Dr. Tamera Punt which shoulder surgery regarding shoulder arthroplasty. I did a short course of oxycodone last time but he knows that he needs to get further pain medication from his pain provider Vanice Sarah, PA-C who works with Dr. Vira Blanco.   ___________________________________________ Gwen Her. Dianah Field, M.D., ABFM., CAQSM. Primary Care and [place] Instructor of [place] of [facility] of Medicine

## 2017-08-23 NOTE — Assessment & Plan Note (Signed)
Post single decompression, lumbar spinal stenosis, progressive weakness of the left thigh, with some unsteadiness in gait. I am going to go ahead and order an MRI with IV contrast. He needs a BMP.

## 2017-08-26 DIAGNOSIS — M48061 Spinal stenosis, lumbar region without neurogenic claudication: Secondary | ICD-10-CM | POA: Diagnosis not present

## 2017-08-26 LAB — BASIC METABOLIC PANEL WITH GFR
BUN: 20 mg/dL (ref 7–25)
Chloride: 98 mmol/L (ref 98–110)
Creat: 1.33 mg/dL (ref 0.70–1.33)

## 2017-08-26 LAB — BASIC METABOLIC PANEL
CO2: 28 mmol/L (ref 20–32)
Calcium: 10.1 mg/dL (ref 8.6–10.3)
Glucose, Bld: 137 mg/dL — ABNORMAL HIGH (ref 65–99)
Potassium: 4.4 mmol/L (ref 3.5–5.3)
Sodium: 134 mmol/L — ABNORMAL LOW (ref 135–146)

## 2017-09-06 ENCOUNTER — Ambulatory Visit (INDEPENDENT_AMBULATORY_CARE_PROVIDER_SITE_OTHER): Payer: Medicare Other | Admitting: Sports Medicine

## 2017-09-06 VITALS — BP 101/70 | HR 68 | Resp 18 | Wt 298.0 lb

## 2017-09-06 DIAGNOSIS — Z7901 Long term (current) use of anticoagulants: Secondary | ICD-10-CM

## 2017-09-06 DIAGNOSIS — M19012 Primary osteoarthritis, left shoulder: Secondary | ICD-10-CM | POA: Diagnosis not present

## 2017-09-06 DIAGNOSIS — I480 Paroxysmal atrial fibrillation: Secondary | ICD-10-CM

## 2017-09-06 DIAGNOSIS — I2699 Other pulmonary embolism without acute cor pulmonale: Secondary | ICD-10-CM | POA: Diagnosis not present

## 2017-09-06 DIAGNOSIS — E119 Type 2 diabetes mellitus without complications: Secondary | ICD-10-CM | POA: Diagnosis not present

## 2017-09-06 LAB — POCT INR: INR: 3.4

## 2017-09-06 MED ORDER — PHENTERMINE HCL 37.5 MG PO TABS: ORAL_TABLET | ORAL | 0 refills | Status: DC

## 2017-09-06 MED ORDER — LIRAGLUTIDE 18 MG/3ML ~~LOC~~ SOPN: PEN_INJECTOR | SUBCUTANEOUS | 3 refills | Status: DC

## 2017-09-06 NOTE — Assessment & Plan Note (Signed)
Half dose injection into the left glenohumeral joint, needs a shoulder arthroplasty. No further narcotics for me.

## 2017-09-06 NOTE — Assessment & Plan Note (Signed)
In the donut hole, we need to switch from Trulicity to Victoza for now.

## 2017-09-06 NOTE — Patient Instructions (Signed)
Supratherapeutic, do 1/2 tablet on Mondays, Wednesdays, Fridays, and now Sundays as well, 1 full tablet Tuesday, Thursday, Saturday.  Recheck in 2 weeks.  Currently has 7.5mg  tabs.

## 2017-09-06 NOTE — Assessment & Plan Note (Signed)
Currently in the donut hole, never picked up the phentermine from before. Sending it into [facility], with good Rx it is a lot cheaper. We are essentially reentering the sixth month.

## 2017-09-06 NOTE — Progress Notes (Signed)
Subjective:    CC: Follow-up  HPI: Primary osteoarthritis of left shoulder: It is been a month and a half since his last injection, I did agree to do a half dose injection today to get him through until he can see Dr. Tamera Punt and discuss shoulder arthroplasty.  Anticoagulation: Supratherapeutic today, no bleeding.  Diabetes mellitus type 2: Currently in the donut hole, unable to afford Trulicity, needs to switch to Victoza.  Obesity: Now that he is in the donut hole phentermine is increased to over $100, he can get it for $9 at [facility] with good Rx.  He never filled the phentermine prescribed on the third of this month.  I reviewed the past medical history, family history, social history, surgical history, and allergies today and no changes were needed.  Please see the problem list section below in epic for further details.  Past Medical History: Past Medical History:  Diagnosis Date  . Atrial fibrillation ([place])   . CAD (coronary artery disease)    Stent placed; [place]  . Congestive heart failure (CHF) ([place])   . DVT (deep vein thrombosis) in pregnancy ([place])   . High cholesterol   . Hypertension   . OSA (obstructive sleep apnea) 03/21/2017  . Pulmonary embolus [facility])    Past Surgical History: Past Surgical History:  Procedure Laterality Date  . HERNIA REPAIR    . IVC  2014  . lamenectomy    . left orchiectomy     Social History: Social History   Socioeconomic History  . Marital status: Married    Spouse name: Not on file  . Number of children: 5  . Years of education: Not on file  . Highest education level: Not on file  Occupational History  . Not on file  Social Needs  . Financial resource strain: Not on file  . Food insecurity:    Worry: Not on file    Inability: Not on file  . Transportation needs:    Medical: Not on file    Non-medical: Not on file  Tobacco Use  . Smoking status: Former Smoker    Last attempt to quit: 08/10/1999   Years since quitting: 18.0  . Smokeless tobacco: Never Used  Substance and Sexual Activity  . Alcohol use: Yes    Alcohol/week: 0.0 oz    Comment: Rare  . Drug use: No  . Sexual activity: Yes    Partners: Female  Lifestyle  . Physical activity:    Days per week: Not on file    Minutes per session: Not on file  . Stress: Not on file  Relationships  . Social connections:    Talks on phone: Not on file    Gets together: Not on file    Attends religious service: Not on file    Active member of club or organization: Not on file    Attends meetings of clubs or organizations: Not on file    Relationship status: Not on file  Other Topics Concern  . Not on file  Social History Narrative  . Not on file   Family History: Family History  Problem Relation Age of Onset  . Thyroid cancer Mother   . Depression Mother   . High Cholesterol Father   . High blood pressure Father   . Heart disease Father        Atrial fibrillation  . Stroke Unknown        Grandfather   Allergies: Allergies  Allergen Reactions  .  Lipitor [Atorvastatin]     myalgias   Medications: See med rec.  Review of Systems: No fevers, chills, night sweats, weight loss, chest pain, or shortness of breath.   Objective:    General: Well Developed, well nourished, and in no acute distress.  Neuro: Alert and oriented x3, extra-ocular muscles intact, sensation grossly intact.  HEENT: Normocephalic, atraumatic, pupils equal round reactive to light, neck supple, no masses, no lymphadenopathy, thyroid nonpalpable.  Skin: Warm and dry, no rashes. Cardiac: Regular rate and rhythm, no murmurs rubs or gallops, no lower extremity edema.  Respiratory: Clear to auscultation bilaterally. Not using accessory muscles, speaking in full sentences.  Procedure: Real-time Ultrasound Guided Injection of left glenohumeral joint Device: GE Logiq E  Verbal informed consent obtained.  Time-out conducted.  Noted no overlying erythema,  induration, or other signs of local infection.  Skin prepped in a sterile fashion.  Local anesthesia: Topical Ethyl chloride.  With sterile technique and under real time ultrasound guidance: 0.5 cc Kenalog 40, 2 cc lidocaine, 2 cc bupivacaine injected easily from a posterior approach using a 22-gauge spinal needle Completed without difficulty  Pain immediately resolved suggesting accurate placement of the medication.  Advised to call if fevers/chills, erythema, induration, drainage, or persistent bleeding.  Images permanently stored and available for review in the ultrasound unit.  Impression: Technically successful ultrasound guided injection.  Impression and Recommendations:    Primary osteoarthritis of left shoulder Half dose injection into the left glenohumeral joint, needs a shoulder arthroplasty. No further narcotics for me.  Morbid obesity ([place]) Currently in the donut hole, never picked up the phentermine from before. Sending it into [facility], with good Rx it is a lot cheaper. We are essentially reentering the sixth month.  Controlled type 2 diabetes mellitus without complication, without long-term current use of insulin (HCC) In the donut hole, we need to switch from Trulicity to Victoza for now. ___________________________________________ Gwen Her. Dianah Field, M.D., ABFM., CAQSM. Primary Care and [place] Instructor of [place] of [facility] of Medicine

## 2017-09-07 ENCOUNTER — Ambulatory Visit (HOSPITAL_BASED_OUTPATIENT_CLINIC_OR_DEPARTMENT_OTHER)
Admission: RE | Admit: 2017-09-07 | Discharge: 2017-09-07 | Disposition: A | Discharge status: Home / Self Care | Payer: Medicare Other | Source: Ambulatory Visit | Attending: Sports Medicine | Admitting: Sports Medicine

## 2017-09-07 DIAGNOSIS — M47816 Spondylosis without myelopathy or radiculopathy, lumbar region: Secondary | ICD-10-CM | POA: Diagnosis not present

## 2017-09-07 DIAGNOSIS — M48061 Spinal stenosis, lumbar region without neurogenic claudication: Secondary | ICD-10-CM

## 2017-09-07 DIAGNOSIS — M5136 Other intervertebral disc degeneration, lumbar region: Secondary | ICD-10-CM | POA: Diagnosis not present

## 2017-09-07 DIAGNOSIS — M4804 Spinal stenosis, thoracic region: Secondary | ICD-10-CM | POA: Diagnosis not present

## 2017-09-07 MED ORDER — GADOBENATE DIMEGLUMINE 529 MG/ML IV SOLN
20.0000 mL | Freq: Once | INTRAVENOUS | Status: AC | PRN
  Administered 2017-09-07: 20 mL via INTRAVENOUS

## 2017-09-09 DIAGNOSIS — G894 Chronic pain syndrome: Secondary | ICD-10-CM | POA: Diagnosis not present

## 2017-09-09 DIAGNOSIS — Z79891 Long term (current) use of opiate analgesic: Secondary | ICD-10-CM | POA: Diagnosis not present

## 2017-09-09 DIAGNOSIS — M25519 Pain in unspecified shoulder: Secondary | ICD-10-CM | POA: Diagnosis not present

## 2017-09-09 DIAGNOSIS — M961 Postlaminectomy syndrome, not elsewhere classified: Secondary | ICD-10-CM | POA: Diagnosis not present

## 2017-09-09 DIAGNOSIS — Z79899 Other long term (current) drug therapy: Secondary | ICD-10-CM | POA: Diagnosis not present

## 2017-09-09 DIAGNOSIS — M79606 Pain in leg, unspecified: Secondary | ICD-10-CM | POA: Diagnosis not present

## 2017-09-17 ENCOUNTER — Telehealth: Payer: Self-pay | Admitting: Sports Medicine

## 2017-09-17 MED ORDER — VALSARTAN-HYDROCHLOROTHIAZIDE 320-12.5 MG PO TABS: 1.0000 | ORAL_TABLET | Freq: Every day | ORAL | 3 refills | Status: DC

## 2017-09-17 NOTE — Telephone Encounter (Signed)
Pharmacy calling with unavailability of irbesartan/HCTZ, switching to valsartan/HCTZ.

## 2017-09-20 ENCOUNTER — Ambulatory Visit: Payer: Medicare Other

## 2017-09-20 ENCOUNTER — Ambulatory Visit (INDEPENDENT_AMBULATORY_CARE_PROVIDER_SITE_OTHER): Payer: Medicare Other | Admitting: Sports Medicine

## 2017-09-20 ENCOUNTER — Encounter: Payer: Self-pay | Admitting: Sports Medicine

## 2017-09-20 DIAGNOSIS — I480 Paroxysmal atrial fibrillation: Secondary | ICD-10-CM

## 2017-09-20 DIAGNOSIS — E119 Type 2 diabetes mellitus without complications: Secondary | ICD-10-CM | POA: Diagnosis not present

## 2017-09-20 DIAGNOSIS — M48061 Spinal stenosis, lumbar region without neurogenic claudication: Secondary | ICD-10-CM | POA: Diagnosis not present

## 2017-09-20 MED ORDER — EXENATIDE ER 2 MG/0.85ML ~~LOC~~ AUIJ: 2.0000 mg | AUTO-INJECTOR | SUBCUTANEOUS | 11 refills | Status: DC

## 2017-09-20 NOTE — Assessment & Plan Note (Signed)
Single level decompression with multilevel spinal stenosis on the newest MRI. He does have some progressive left thigh weakness with L2-L3 and L3-L4 central canal stenosis with leftward protrusions likely responsible. I have advised him to discuss this further with Dr. Vira Blanco and his neurosurgeon Dr. Gloriann Loan.

## 2017-09-20 NOTE — Assessment & Plan Note (Signed)
Victoza no longer covered, switching to [employer].

## 2017-09-20 NOTE — Assessment & Plan Note (Signed)
At the last visit we dropped him down to 1/2 tablet on Mondays, Wednesdays, Fridays and 1 tablet on other days. Currently on 7.5 mg tablets. Sending him downstairs for an INR check.

## 2017-09-20 NOTE — Progress Notes (Signed)
Subjective:    CC: INR check  HPI: Anticoagulation: Unable to get an INR with our machine here.  Obesity: Good continued weight loss, needs a follow-up in about 2 weeks for this.  Diabetes mellitus type 2: No longer able to do Victoza, Bydureon will be covered so he needs to switch to this.  Lumbar spinal stenosis: Post 1 level decompression, now with weakness in the left thigh, right thigh and weakness in the foot.  MRI results will be dictated below.  I reviewed the past medical history, family history, social history, surgical history, and allergies today and no changes were needed.  Please see the problem list section below in epic for further details.  Past Medical History: Past Medical History:  Diagnosis Date  . Atrial fibrillation ([place])   . CAD (coronary artery disease)    Stent placed; [place]  . Congestive heart failure (CHF) ([place])   . DVT (deep vein thrombosis) in pregnancy ([place])   . High cholesterol   . Hypertension   . OSA (obstructive sleep apnea) 03/21/2017  . Pulmonary embolus [facility])    Past Surgical History: Past Surgical History:  Procedure Laterality Date  . HERNIA REPAIR    . IVC  2014  . lamenectomy    . left orchiectomy     Social History: Social History   Socioeconomic History  . Marital status: Married    Spouse name: Not on file  . Number of children: 5  . Years of education: Not on file  . Highest education level: Not on file  Occupational History  . Not on file  Social Needs  . Financial resource strain: Not on file  . Food insecurity:    Worry: Not on file    Inability: Not on file  . Transportation needs:    Medical: Not on file    Non-medical: Not on file  Tobacco Use  . Smoking status: Former Smoker    Last attempt to quit: 08/10/1999    Years since quitting: 18.1  . Smokeless tobacco: Never Used  Substance and Sexual Activity  . Alcohol use: Yes    Alcohol/week: 0.0 oz    Comment: Rare  . Drug use: No  .  Sexual activity: Yes    Partners: Female  Lifestyle  . Physical activity:    Days per week: Not on file    Minutes per session: Not on file  . Stress: Not on file  Relationships  . Social connections:    Talks on phone: Not on file    Gets together: Not on file    Attends religious service: Not on file    Active member of club or organization: Not on file    Attends meetings of clubs or organizations: Not on file    Relationship status: Not on file  Other Topics Concern  . Not on file  Social History Narrative  . Not on file   Family History: Family History  Problem Relation Age of Onset  . Thyroid cancer Mother   . Depression Mother   . High Cholesterol Father   . High blood pressure Father   . Heart disease Father        Atrial fibrillation  . Stroke Unknown        Grandfather   Allergies: Allergies  Allergen Reactions  . Lipitor [Atorvastatin]     myalgias   Medications: See med rec.  Review of Systems: No fevers, chills, night sweats, weight loss, chest pain,  or shortness of breath.   Objective:    General: Well Developed, well nourished, and in no acute distress.  Neuro: Alert and oriented x3, extra-ocular muscles intact, sensation grossly intact.  HEENT: Normocephalic, atraumatic, pupils equal round reactive to light, neck supple, no masses, no lymphadenopathy, thyroid nonpalpable.  Skin: Warm and dry, no rashes. Cardiac: Regular rate and rhythm, no murmurs rubs or gallops, no lower extremity edema.  Respiratory: Clear to auscultation bilaterally. Not using accessory muscles, speaking in full sentences.  MRI reviewed, personally shows L2-L3 and L3-L4 central stenosis with leftward protrusions likely responsible for his left thigh numbness and weakness.  Impression and Recommendations:    Spinal stenosis of lumbar region Single level decompression with multilevel spinal stenosis on the newest MRI. He does have some progressive left thigh weakness with  L2-L3 and L3-L4 central canal stenosis with leftward protrusions likely responsible. I have advised him to discuss this further with Dr. Vira Blanco and his neurosurgeon Dr. Gloriann Loan.  Controlled type 2 diabetes mellitus without complication, without long-term current use of insulin (HCC) Victoza no longer covered, switching to [employer].  Paroxysmal atrial fibrillation ([place]) At the last visit we dropped him down to 1/2 tablet on Mondays, Wednesdays, Fridays and 1 tablet on other days. Currently on 7.5 mg tablets. Sending him downstairs for an INR check.  ___________________________________________ Gwen Her. Dianah Field, M.D., ABFM., CAQSM. Primary Care and [place] Instructor of [place] of [facility] of Medicine

## 2017-09-21 LAB — HEMOGLOBIN A1C
Hgb A1c MFr Bld: 6.8 %{Hb} — ABNORMAL HIGH (ref <5.7)
Mean Plasma Glucose: 148 (calc)
eAG (mmol/L): 8.2 (calc)

## 2017-09-21 LAB — PROTIME-INR
INR: 2.7 — ABNORMAL HIGH
Prothrombin Time: 28 s — ABNORMAL HIGH (ref 9.0–11.5)

## 2017-09-27 ENCOUNTER — Ambulatory Visit: Payer: Medicare Other

## 2017-10-04 ENCOUNTER — Encounter: Payer: Self-pay | Admitting: Sports Medicine

## 2017-10-04 ENCOUNTER — Ambulatory Visit (INDEPENDENT_AMBULATORY_CARE_PROVIDER_SITE_OTHER): Payer: Medicare Other | Admitting: Sports Medicine

## 2017-10-04 ENCOUNTER — Telehealth: Payer: Self-pay

## 2017-10-04 MED ORDER — PHENTERMINE HCL 37.5 MG PO TABS: ORAL_TABLET | ORAL | 0 refills | Status: DC

## 2017-10-04 MED ORDER — TOPIRAMATE 50 MG PO TABS: 50.0000 mg | ORAL_TABLET | Freq: Two times a day (BID) | ORAL | 3 refills | Status: DC

## 2017-10-04 NOTE — Telephone Encounter (Signed)
Elio called and states the Phentermine was sent to [facility] instead of [facility]. Please advise.

## 2017-10-04 NOTE — Progress Notes (Signed)
Subjective:    CC: Weight check  HPI: Jeff Green returns, he is lost a few more pounds over the past month.  Only doing Topamax once a day.  Diabetes mellitus type 2: A1c improved considerably from 8 down to 6.8 with weight loss.  Continues with Bydureon.  I reviewed the past medical history, family history, social history, surgical history, and allergies today and no changes were needed.  Please see the problem list section below in epic for further details.  Past Medical History: Past Medical History:  Diagnosis Date  . Atrial fibrillation ([place])   . CAD (coronary artery disease)    Stent placed; [place]  . Congestive heart failure (CHF) ([place])   . DVT (deep vein thrombosis) in pregnancy ([place])   . High cholesterol   . Hypertension   . OSA (obstructive sleep apnea) 03/21/2017  . Pulmonary embolus [facility])    Past Surgical History: Past Surgical History:  Procedure Laterality Date  . HERNIA REPAIR    . IVC  2014  . lamenectomy    . left orchiectomy     Social History: Social History   Socioeconomic History  . Marital status: Married    Spouse name: Not on file  . Number of children: 5  . Years of education: Not on file  . Highest education level: Not on file  Occupational History  . Not on file  Social Needs  . Financial resource strain: Not on file  . Food insecurity:    Worry: Not on file    Inability: Not on file  . Transportation needs:    Medical: Not on file    Non-medical: Not on file  Tobacco Use  . Smoking status: Former Smoker    Last attempt to quit: 08/10/1999    Years since quitting: 18.1  . Smokeless tobacco: Never Used  Substance and Sexual Activity  . Alcohol use: Yes    Alcohol/week: 0.0 oz    Comment: Rare  . Drug use: No  . Sexual activity: Yes    Partners: Female  Lifestyle  . Physical activity:    Days per week: Not on file    Minutes per session: Not on file  . Stress: Not on file  Relationships  . Social connections:    Talks on phone: Not on file    Gets together: Not on file    Attends religious service: Not on file    Active member of club or organization: Not on file    Attends meetings of clubs or organizations: Not on file    Relationship status: Not on file  Other Topics Concern  . Not on file  Social History Narrative  . Not on file   Family History: Family History  Problem Relation Age of Onset  . Thyroid cancer Mother   . Depression Mother   . High Cholesterol Father   . High blood pressure Father   . Heart disease Father        Atrial fibrillation  . Stroke Unknown        Grandfather   Allergies: Allergies  Allergen Reactions  . Lipitor [Atorvastatin]     myalgias   Medications: See med rec.  Review of Systems: No fevers, chills, night sweats, weight loss, chest pain, or shortness of breath.   Objective:    General: Well Developed, well nourished, and in no acute distress.  Neuro: Alert and oriented x3, extra-ocular muscles intact, sensation grossly intact.  HEENT: Normocephalic, atraumatic, pupils  equal round reactive to light, neck supple, no masses, no lymphadenopathy, thyroid nonpalpable.  Skin: Warm and dry, no rashes. Cardiac: Regular rate and rhythm, no murmurs rubs or gallops, no lower extremity edema.  Respiratory: Clear to auscultation bilaterally. Not using accessory muscles, speaking in full sentences.  Impression and Recommendations:    Morbid obesity ([place]) Good continued weight loss, we are going to do another month full dose and then drop to a half dose. He has really not been taking his Topamax as directed. Increasing to twice a day. After the next month we will drop to one half tab phentermine.  I spent 25 minutes with this patient, greater than 50% was face-to-face time counseling regarding the above diagnoses ___________________________________________ Gwen Her. Dianah Field, M.D., ABFM., CAQSM. Primary Care and [place] Instructor of [place] of [facility] of Medicine

## 2017-10-04 NOTE — Telephone Encounter (Addendum)
Medication cancelled at [facility] and left message on patient's voicemail advising.

## 2017-10-04 NOTE — Telephone Encounter (Signed)
Sigh..he asked he to send it to [place]...!  Resending phentermine to [facility].  I guess the Topamax is ok at the initial pharmacy?

## 2017-10-04 NOTE — Assessment & Plan Note (Signed)
Good continued weight loss, we are going to do another month full dose and then drop to a half dose. He has really not been taking his Topamax as directed. Increasing to twice a day. After the next month we will drop to one half tab phentermine.

## 2017-10-07 DIAGNOSIS — G894 Chronic pain syndrome: Secondary | ICD-10-CM | POA: Diagnosis not present

## 2017-10-07 DIAGNOSIS — M545 Low back pain: Secondary | ICD-10-CM | POA: Diagnosis not present

## 2017-10-07 DIAGNOSIS — Z79899 Other long term (current) drug therapy: Secondary | ICD-10-CM | POA: Diagnosis not present

## 2017-10-07 DIAGNOSIS — M79606 Pain in leg, unspecified: Secondary | ICD-10-CM | POA: Diagnosis not present

## 2017-10-07 DIAGNOSIS — M25519 Pain in unspecified shoulder: Secondary | ICD-10-CM | POA: Diagnosis not present

## 2017-10-07 DIAGNOSIS — Z79891 Long term (current) use of opiate analgesic: Secondary | ICD-10-CM | POA: Diagnosis not present

## 2017-10-08 DIAGNOSIS — M961 Postlaminectomy syndrome, not elsewhere classified: Secondary | ICD-10-CM | POA: Diagnosis not present

## 2017-10-10 ENCOUNTER — Telehealth: Payer: Self-pay

## 2017-10-10 NOTE — Telephone Encounter (Signed)
Pt called- he is in the donut hole and cannot afford the copay for his Bydureon. He cannot use use discount cards with his ins due to being on medicare  Please advise

## 2017-10-11 ENCOUNTER — Other Ambulatory Visit: Payer: Self-pay | Admitting: Sports Medicine

## 2017-10-11 MED ORDER — GLIPIZIDE 5 MG PO TABS: 5.0000 mg | ORAL_TABLET | Freq: Two times a day (BID) | ORAL | 11 refills | Status: DC

## 2017-10-11 NOTE — Telephone Encounter (Signed)
We can use glipizide high-dose twice a day until he is out of the donut hole.

## 2017-10-11 NOTE — Telephone Encounter (Signed)
RX sent by provider. Pt advised. He states he is trying to fill out paperwork for pt assistance, but will take Glipizide until he is able to obtain assistance.

## 2017-10-18 ENCOUNTER — Ambulatory Visit (INDEPENDENT_AMBULATORY_CARE_PROVIDER_SITE_OTHER): Payer: Medicare Other | Admitting: Sports Medicine

## 2017-10-18 DIAGNOSIS — I480 Paroxysmal atrial fibrillation: Secondary | ICD-10-CM | POA: Diagnosis not present

## 2017-10-18 DIAGNOSIS — I2699 Other pulmonary embolism without acute cor pulmonale: Secondary | ICD-10-CM

## 2017-10-18 LAB — POCT INR: INR: 3.1 — AB (ref 2.0–3.0)

## 2017-10-18 NOTE — Progress Notes (Signed)
Pt advised. Verbalized understanding.

## 2017-10-18 NOTE — Assessment & Plan Note (Signed)
Slightly supratherapeutic but was normal at the last check, continue 1/2 tablet on Mondays, Wednesdays, Fridays and 1 tablet on other days. Currently on 7.5 mg tablets. Recheck in 2 weeks.

## 2017-10-18 NOTE — Progress Notes (Signed)
INR visit

## 2017-10-31 DIAGNOSIS — M5417 Radiculopathy, lumbosacral region: Secondary | ICD-10-CM | POA: Diagnosis not present

## 2017-11-01 ENCOUNTER — Ambulatory Visit: Payer: Medicare Other | Admitting: Sports Medicine

## 2017-11-04 ENCOUNTER — Ambulatory Visit (INDEPENDENT_AMBULATORY_CARE_PROVIDER_SITE_OTHER): Payer: Medicare Other | Admitting: Sports Medicine

## 2017-11-04 ENCOUNTER — Encounter: Payer: Self-pay | Admitting: Sports Medicine

## 2017-11-04 VITALS — BP 96/67 | HR 78 | Ht 74.02 in | Wt 284.0 lb

## 2017-11-04 DIAGNOSIS — G894 Chronic pain syndrome: Secondary | ICD-10-CM | POA: Diagnosis not present

## 2017-11-04 DIAGNOSIS — E119 Type 2 diabetes mellitus without complications: Secondary | ICD-10-CM | POA: Diagnosis not present

## 2017-11-04 DIAGNOSIS — I480 Paroxysmal atrial fibrillation: Secondary | ICD-10-CM

## 2017-11-04 DIAGNOSIS — I2699 Other pulmonary embolism without acute cor pulmonale: Secondary | ICD-10-CM

## 2017-11-04 DIAGNOSIS — M79606 Pain in leg, unspecified: Secondary | ICD-10-CM | POA: Diagnosis not present

## 2017-11-04 DIAGNOSIS — M545 Low back pain: Secondary | ICD-10-CM | POA: Diagnosis not present

## 2017-11-04 DIAGNOSIS — M25519 Pain in unspecified shoulder: Secondary | ICD-10-CM | POA: Diagnosis not present

## 2017-11-04 LAB — POCT INR: INR: 1.7 — AB (ref 2.0–3.0)

## 2017-11-04 MED ORDER — TOPIRAMATE 50 MG PO TABS: 100.0000 mg | ORAL_TABLET | Freq: Two times a day (BID) | ORAL | 3 refills | Status: DC

## 2017-11-04 MED ORDER — PHENTERMINE HCL 37.5 MG PO TABS: ORAL_TABLET | ORAL | 0 refills | Status: DC

## 2017-11-04 NOTE — Assessment & Plan Note (Signed)
Currently on Bydureon, he will be switching to Trulicity. Continue other medications, A1c has improved considerably with weight loss.

## 2017-11-04 NOTE — Patient Instructions (Signed)
Continue 1/2 tablet on Mondays, Wednesdays, Fridays and 1 tablet on other days. Currently on 7.5 mg tablets. Come back for a nurse visit INR check in 2 weeks

## 2017-11-04 NOTE — Assessment & Plan Note (Signed)
Additional 8 pound weight loss over the last month. At this point we are dropping phentermine to one half tab, 62-month supply given. Increase Topamax to 100 mg twice a day. Return in 3 months for this. Total weight loss is 44 pounds so far.

## 2017-11-04 NOTE — Progress Notes (Signed)
Subjective:    CC: Follow-up  HPI: Anticoagulation: Supratherapeutic at the last visit, we made no changes in his dosage regimen, he is now subtherapeutic at 1.7 today.  Obesity: 8 additional pound weight loss since a month ago.  Diabetes mellitus type 2: Would like to switch to Trulicity, he is going to be getting assistance from the manufacturer.  I reviewed the past medical history, family history, social history, surgical history, and allergies today and no changes were needed.  Please see the problem list section below in epic for further details.  Past Medical History: Past Medical History:  Diagnosis Date  . Atrial fibrillation ([place])   . CAD (coronary artery disease)    Stent placed; [place]  . Congestive heart failure (CHF) ([place])   . DVT (deep vein thrombosis) in pregnancy ([place])   . High cholesterol   . Hypertension   . OSA (obstructive sleep apnea) 03/21/2017  . Pulmonary embolus [facility])    Past Surgical History: Past Surgical History:  Procedure Laterality Date  . HERNIA REPAIR    . IVC  2014  . lamenectomy    . left orchiectomy     Social History: Social History   Socioeconomic History  . Marital status: Married    Spouse name: Not on file  . Number of children: 5  . Years of education: Not on file  . Highest education level: Not on file  Occupational History  . Not on file  Social Needs  . Financial resource strain: Not on file  . Food insecurity:    Worry: Not on file    Inability: Not on file  . Transportation needs:    Medical: Not on file    Non-medical: Not on file  Tobacco Use  . Smoking status: Former Smoker    Last attempt to quit: 08/10/1999    Years since quitting: 18.2  . Smokeless tobacco: Never Used  Substance and Sexual Activity  . Alcohol use: Yes    Alcohol/week: 0.0 oz    Comment: Rare  . Drug use: No  . Sexual activity: Yes    Partners: Female  Lifestyle  . Physical activity:    Days per week: Not on file   Minutes per session: Not on file  . Stress: Not on file  Relationships  . Social connections:    Talks on phone: Not on file    Gets together: Not on file    Attends religious service: Not on file    Active member of club or organization: Not on file    Attends meetings of clubs or organizations: Not on file    Relationship status: Not on file  Other Topics Concern  . Not on file  Social History Narrative  . Not on file   Family History: Family History  Problem Relation Age of Onset  . Thyroid cancer Mother   . Depression Mother   . High Cholesterol Father   . High blood pressure Father   . Heart disease Father        Atrial fibrillation  . Stroke Unknown        Grandfather   Allergies: Allergies  Allergen Reactions  . Lipitor [Atorvastatin]     myalgias   Medications: See med rec.  Review of Systems: No fevers, chills, night sweats, weight loss, chest pain, or shortness of breath.   Objective:    General: Well Developed, well nourished, and in no acute distress.  Neuro: Alert and oriented x3,  extra-ocular muscles intact, sensation grossly intact.  HEENT: Normocephalic, atraumatic, pupils equal round reactive to light, neck supple, no masses, no lymphadenopathy, thyroid nonpalpable.  Skin: Warm and dry, no rashes. Cardiac: Regular rate and rhythm, no murmurs rubs or gallops, no lower extremity edema.  Respiratory: Clear to auscultation bilaterally. Not using accessory muscles, speaking in full sentences.  Impression and Recommendations:    Morbid obesity ([place]) Additional 8 pound weight loss over the last month. At this point we are dropping phentermine to one half tab, 40-month supply given. Increase Topamax to 100 mg twice a day. Return in 3 months for this. Total weight loss is 44 pounds so far.  Recurrent pulmonary emboli INR goal 2-3, 1.7 today. He was 3.1 at the last visit 2 weeks ago, no changes in dosing regimen. Continue 1/2 tablet on Mondays,  Wednesdays, Fridays and 1 tablet on other days. Currently on 7.5 mg tablets. Return in a nurse visit in 2 weeks.  Controlled type 2 diabetes mellitus without complication, without long-term current use of insulin (HCC) Currently on Bydureon, he will be switching to Trulicity. Continue other medications, A1c has improved considerably with weight loss. ___________________________________________ Gwen Her. Dianah Field, M.D., ABFM., CAQSM. Primary Care and [place] Instructor of [place] of [facility] of Medicine

## 2017-11-04 NOTE — Assessment & Plan Note (Addendum)
INR goal 2-3, 1.7 today. He was 3.1 at the last visit 2 weeks ago, no changes in dosing regimen. Continue 1/2 tablet on Mondays, Wednesdays, Fridays and 1 tablet on other days. Currently on 7.5 mg tablets. Return in a nurse visit in 2 weeks.

## 2017-11-08 ENCOUNTER — Telehealth: Payer: Self-pay

## 2017-11-08 MED ORDER — PHENTERMINE HCL 37.5 MG PO TABS: ORAL_TABLET | ORAL | 0 refills | Status: DC

## 2017-11-08 NOTE — Telephone Encounter (Signed)
Kristopher Oppenheim called and states the phentermine was sent to the wrong pharmacy. The cost is less at [facility]. It was sent to [facility] by mistake. Please advised.    Dr Dianah Field patient.

## 2017-11-08 NOTE — Telephone Encounter (Signed)
New rx sent. Please cancel other one. Thankyou.

## 2017-11-11 NOTE — Telephone Encounter (Signed)
Attempted to contact [place], pharmacy was closed.   Left VM update with Pt to advise of Rx at [facility] which should be ready for pick up.

## 2017-11-12 NOTE — Telephone Encounter (Signed)
Cancelled prescription.  

## 2017-11-14 ENCOUNTER — Encounter: Payer: Self-pay | Admitting: Sports Medicine

## 2017-11-14 DIAGNOSIS — M961 Postlaminectomy syndrome, not elsewhere classified: Secondary | ICD-10-CM | POA: Diagnosis not present

## 2017-11-18 ENCOUNTER — Other Ambulatory Visit: Payer: Self-pay | Admitting: *Deleted

## 2017-11-18 ENCOUNTER — Ambulatory Visit (INDEPENDENT_AMBULATORY_CARE_PROVIDER_SITE_OTHER): Payer: Medicare Other | Admitting: Physician Assistant

## 2017-11-18 DIAGNOSIS — I480 Paroxysmal atrial fibrillation: Secondary | ICD-10-CM | POA: Diagnosis not present

## 2017-11-18 DIAGNOSIS — I2699 Other pulmonary embolism without acute cor pulmonale: Secondary | ICD-10-CM | POA: Diagnosis not present

## 2017-11-18 LAB — POCT INR: INR: 2.9 (ref 2.0–3.0)

## 2017-11-18 MED ORDER — WARFARIN SODIUM 7.5 MG PO TABS: 7.5000 mg | ORAL_TABLET | Freq: Every day | ORAL | 1 refills | Status: DC

## 2017-11-29 ENCOUNTER — Ambulatory Visit (INDEPENDENT_AMBULATORY_CARE_PROVIDER_SITE_OTHER): Payer: Medicare Other | Admitting: Sports Medicine

## 2017-11-29 DIAGNOSIS — I480 Paroxysmal atrial fibrillation: Secondary | ICD-10-CM | POA: Diagnosis not present

## 2017-11-29 DIAGNOSIS — I2699 Other pulmonary embolism without acute cor pulmonale: Secondary | ICD-10-CM | POA: Diagnosis not present

## 2017-11-29 LAB — POCT INR: INR: 2.4 (ref 2.0–3.0)

## 2017-11-29 NOTE — Progress Notes (Signed)
INR check today.  

## 2017-12-04 NOTE — Progress Notes (Signed)
Left message for patient to call back about dosage per Dr. Darene Lamer recommendations and to schedule a nurse visit in 1 month.

## 2017-12-13 ENCOUNTER — Other Ambulatory Visit: Payer: Self-pay | Admitting: Sports Medicine

## 2017-12-13 MED ORDER — TESTOSTERONE CYPIONATE 200 MG/ML IM SOLN: INTRAMUSCULAR | 3 refills | Status: DC

## 2017-12-13 NOTE — Telephone Encounter (Signed)
Needs refill on testosterone sent to [facility]

## 2017-12-17 DIAGNOSIS — M961 Postlaminectomy syndrome, not elsewhere classified: Secondary | ICD-10-CM | POA: Diagnosis not present

## 2018-01-09 ENCOUNTER — Other Ambulatory Visit: Payer: Self-pay | Admitting: Family Medicine

## 2018-01-09 ENCOUNTER — Other Ambulatory Visit: Payer: Self-pay | Admitting: Sports Medicine

## 2018-01-09 NOTE — Telephone Encounter (Signed)
Routing to pcp for review and signature.Reginald Mangels [place], CMA  

## 2018-01-14 DIAGNOSIS — M961 Postlaminectomy syndrome, not elsewhere classified: Secondary | ICD-10-CM | POA: Diagnosis not present

## 2018-01-14 DIAGNOSIS — M25519 Pain in unspecified shoulder: Secondary | ICD-10-CM | POA: Diagnosis not present

## 2018-01-14 DIAGNOSIS — G894 Chronic pain syndrome: Secondary | ICD-10-CM | POA: Diagnosis not present

## 2018-01-14 DIAGNOSIS — Z79891 Long term (current) use of opiate analgesic: Secondary | ICD-10-CM | POA: Diagnosis not present

## 2018-01-14 DIAGNOSIS — Z79899 Other long term (current) drug therapy: Secondary | ICD-10-CM | POA: Diagnosis not present

## 2018-01-14 DIAGNOSIS — M25561 Pain in right knee: Secondary | ICD-10-CM | POA: Diagnosis not present

## 2018-01-17 ENCOUNTER — Other Ambulatory Visit: Payer: Self-pay

## 2018-01-17 MED ORDER — DILTIAZEM HCL ER COATED BEADS 120 MG PO CP24: 120.0000 mg | ORAL_CAPSULE | Freq: Every day | ORAL | 1 refills | Status: DC

## 2018-02-04 ENCOUNTER — Encounter: Payer: Self-pay | Admitting: Sports Medicine

## 2018-02-04 ENCOUNTER — Ambulatory Visit (INDEPENDENT_AMBULATORY_CARE_PROVIDER_SITE_OTHER): Payer: Medicare Other | Admitting: Sports Medicine

## 2018-02-04 VITALS — BP 149/83 | HR 98 | Ht 74.0 in | Wt 275.0 lb

## 2018-02-04 DIAGNOSIS — M48061 Spinal stenosis, lumbar region without neurogenic claudication: Secondary | ICD-10-CM | POA: Diagnosis not present

## 2018-02-04 DIAGNOSIS — I2699 Other pulmonary embolism without acute cor pulmonale: Secondary | ICD-10-CM

## 2018-02-04 DIAGNOSIS — E119 Type 2 diabetes mellitus without complications: Secondary | ICD-10-CM

## 2018-02-04 DIAGNOSIS — M19012 Primary osteoarthritis, left shoulder: Secondary | ICD-10-CM

## 2018-02-04 DIAGNOSIS — I480 Paroxysmal atrial fibrillation: Secondary | ICD-10-CM

## 2018-02-04 LAB — POCT INR: INR: 2.2 (ref 2.0–3.0)

## 2018-02-04 MED ORDER — PHENTERMINE HCL 37.5 MG PO TABS: ORAL_TABLET | ORAL | 0 refills | Status: DC

## 2018-02-04 MED ORDER — TOPIRAMATE 100 MG PO TABS: 100.0000 mg | ORAL_TABLET | Freq: Two times a day (BID) | ORAL | 3 refills | Status: DC

## 2018-02-04 MED ORDER — CARVEDILOL 25 MG PO TABS: 25.0000 mg | ORAL_TABLET | Freq: Two times a day (BID) | ORAL | 3 refills | Status: DC

## 2018-02-04 NOTE — Assessment & Plan Note (Signed)
Single level decompression with multilevel spinal stenosis on MRI, progressive left thigh weakness with L2-L3 and L3-L4 central canal stenosis. He will discuss surgical intervention with Dr. Gloriann Loan. He does have a relatively new MRI from this year.

## 2018-02-04 NOTE — Assessment & Plan Note (Signed)
Additional 10 pound weight loss bringing the total weight loss to 54 pounds. Single additional refill of phentermine half dose, continue Topamax 100 twice a day. Emia hypercalcemia

## 2018-02-04 NOTE — Progress Notes (Addendum)
Subjective:    CC: Multiple issues  HPI: Obesity: Additional 10 pound weight loss, he is on half dose phentermine now.  Also taking Topamax 100 twice a day.  History of multiple PEs, paroxysmal A. fib: On Coumadin, INR is 2.2 today.  Left shoulder pain: Known end-stage glenohumeral osteoarthritis, previous injection was 5 months ago, having a recurrence of pain, moderate, persistent, localized without radiation.  Left lumbar radiculitis: With multilevel central canal stenosis, post single level decompression with Dr. Gloriann Loan, we have done several epidurals, continues to have left-sided anterior thigh and anterior lower leg pain with radiation to the great toe, at this point I think he is a candidate for repeat surgical intervention considering failure of conservative treatment.  I reviewed the past medical history, family history, social history, surgical history, and allergies today and no changes were needed.  Please see the problem list section below in epic for further details.  Past Medical History: Past Medical History:  Diagnosis Date  . Atrial fibrillation ([place])   . CAD (coronary artery disease)    Stent placed; [place]  . Congestive heart failure (CHF) ([place])   . DVT (deep vein thrombosis) in pregnancy   . High cholesterol   . Hypertension   . OSA (obstructive sleep apnea) 03/21/2017  . Pulmonary embolus [facility] [place])    Past Surgical History: Past Surgical History:  Procedure Laterality Date  . HERNIA REPAIR    . IVC  2014  . lamenectomy    . left orchiectomy     Social History: Social History   Socioeconomic History  . Marital status: Married    Spouse name: Not on file  . Number of children: 5  . Years of education: Not on file  . Highest education level: Not on file  Occupational History  . Not on file  Social Needs  . Financial resource strain: Not on file  . Food insecurity:    Worry: Not on file    Inability: Not on file  . Transportation needs:      Medical: Not on file    Non-medical: Not on file  Tobacco Use  . Smoking status: Former Smoker    Last attempt to quit: 08/10/1999    Years since quitting: 18.5  . Smokeless tobacco: Never Used  Substance and Sexual Activity  . Alcohol use: Yes    Alcohol/week: 0.0 standard drinks    Comment: Rare  . Drug use: No  . Sexual activity: Yes    Partners: Female  Lifestyle  . Physical activity:    Days per week: Not on file    Minutes per session: Not on file  . Stress: Not on file  Relationships  . Social connections:    Talks on phone: Not on file    Gets together: Not on file    Attends religious service: Not on file    Active member of club or organization: Not on file    Attends meetings of clubs or organizations: Not on file    Relationship status: Not on file  Other Topics Concern  . Not on file  Social History Narrative  . Not on file   Family History: Family History  Problem Relation Age of Onset  . Thyroid cancer Mother   . Depression Mother   . High Cholesterol Father   . High blood pressure Father   . Heart disease Father        Atrial fibrillation  . Stroke Unknown  Grandfather   Allergies: Allergies  Allergen Reactions  . Lipitor [Atorvastatin]     myalgias   Medications: See med rec.  Review of Systems: No fevers, chills, night sweats, weight loss, chest pain, or shortness of breath.   Objective:    General: Well Developed, well nourished, and in no acute distress.  Neuro: Alert and oriented x3, extra-ocular muscles intact, sensation grossly intact.  HEENT: Normocephalic, atraumatic, pupils equal round reactive to light, neck supple, no masses, no lymphadenopathy, thyroid nonpalpable.  Skin: Warm and dry, no rashes. Cardiac: Regular rate and rhythm, no murmurs rubs or gallops, no lower extremity edema.  Respiratory: Clear to auscultation bilaterally. Not using accessory muscles, speaking in full sentences.  Procedure: Real-time  Ultrasound Guided Injection of left glenohumeral joint Device: GE Logiq E  Verbal informed consent obtained.  Time-out conducted.  Noted no overlying erythema, induration, or other signs of local infection.  Skin prepped in a sterile fashion.  Local anesthesia: Topical Ethyl chloride.  With sterile technique and under real time ultrasound guidance: 22-gauge spinal needle advanced into the joint, I injected 1 cc Kenalog 40, 2 cc lidocaine, 2 cc bupivacaine. Completed without difficulty  Pain immediately resolved suggesting accurate placement of the medication.  Advised to call if fevers/chills, erythema, induration, drainage, or persistent bleeding.  Images permanently stored and available for review in the ultrasound unit.  Impression: Technically successful ultrasound guided injection.  Impression and Recommendations:    Primary osteoarthritis of left shoulder I have continued to recommend shoulder arthroplasty, last injection was May of this year 5 months ago. Repeat left glenohumeral injection today.  Recurrent pulmonary emboli INR is therapeutic.  Morbid obesity ([place]) Additional 10 pound weight loss bringing the total weight loss to 54 pounds. Single additional refill of phentermine half dose, continue Topamax 100 twice a day. Emia hypercalcemia  Controlled type 2 diabetes mellitus without complication, without long-term current use of insulin (HCC) Rechecking hemoglobin A1c  Spinal stenosis of lumbar region Single level decompression with multilevel spinal stenosis on MRI, progressive left thigh weakness with L2-L3 and L3-L4 central canal stenosis. He will discuss surgical intervention with Dr. Gloriann Loan. He does have a relatively new MRI from this year. ___________________________________________ Gwen Her. Dianah Field, M.D., ABFM., CAQSM. Primary Care and Sports Medicine [facility]  Adjunct Professor of [place] of [facility] of Medicine

## 2018-02-04 NOTE — Assessment & Plan Note (Signed)
I have continued to recommend shoulder arthroplasty, last injection was May of this year 5 months ago. Repeat left glenohumeral injection today.

## 2018-02-04 NOTE — Assessment & Plan Note (Signed)
INR is therapeutic.

## 2018-02-04 NOTE — Assessment & Plan Note (Signed)
Rechecking hemoglobin A1c 

## 2018-02-06 DIAGNOSIS — E119 Type 2 diabetes mellitus without complications: Secondary | ICD-10-CM | POA: Diagnosis not present

## 2018-02-07 LAB — LIPID PANEL W/REFLEX DIRECT LDL
Cholesterol: 162 mg/dL (ref <200)
HDL: 58 mg/dL (ref >40)
LDL Cholesterol (Calc): 84 mg/dL (calc)
Non-HDL Cholesterol (Calc): 104 mg/dL (calc) (ref <130)
Total CHOL/HDL Ratio: 2.8 (calc) (ref <5.0)
Triglycerides: 106 mg/dL (ref <150)

## 2018-02-07 LAB — COMPREHENSIVE METABOLIC PANEL
AST: 22 U/L (ref 10–35)
Albumin: 4.7 g/dL (ref 3.6–5.1)
BUN/Creatinine Ratio: 24 (calc) — ABNORMAL HIGH (ref 6–22)
BUN: 44 mg/dL — ABNORMAL HIGH (ref 7–25)
CO2: 25 mmol/L (ref 20–32)
Chloride: 104 mmol/L (ref 98–110)
Creat: 1.86 mg/dL — ABNORMAL HIGH (ref 0.70–1.25)
Globulin: 2.4 g/dL (calc) (ref 1.9–3.7)
Glucose, Bld: 103 mg/dL — ABNORMAL HIGH (ref 65–99)
Potassium: 4.3 mmol/L (ref 3.5–5.3)

## 2018-02-07 LAB — COMPREHENSIVE METABOLIC PANEL WITH GFR
AG Ratio: 2 (calc) (ref 1.0–2.5)
ALT: 25 U/L (ref 9–46)
Alkaline phosphatase (APISO): 32 U/L — ABNORMAL LOW (ref 40–115)
Calcium: 10.9 mg/dL — ABNORMAL HIGH (ref 8.6–10.3)
Sodium: 139 mmol/L (ref 135–146)
Total Bilirubin: 0.5 mg/dL (ref 0.2–1.2)
Total Protein: 7.1 g/dL (ref 6.1–8.1)

## 2018-02-07 LAB — CBC
HCT: 49.2 % (ref 38.5–50.0)
Hemoglobin: 16.8 g/dL (ref 13.2–17.1)
MCH: 31.3 pg (ref 27.0–33.0)
MCHC: 34.1 g/dL (ref 32.0–36.0)
MCV: 91.6 fL (ref 80.0–100.0)
MPV: 12.9 fL — ABNORMAL HIGH (ref 7.5–12.5)
Platelets: 167 Thousand/uL (ref 140–400)
RBC: 5.37 Million/uL (ref 4.20–5.80)
RDW: 13.6 % (ref 11.0–15.0)
WBC: 12.2 10*3/uL — ABNORMAL HIGH (ref 3.8–10.8)

## 2018-02-07 LAB — HEMOGLOBIN A1C
Hgb A1c MFr Bld: 5.8 % of total Hgb — ABNORMAL HIGH (ref <5.7)
Mean Plasma Glucose: 120 (calc)
eAG (mmol/L): 6.6 (calc)

## 2018-02-07 LAB — TSH: TSH: 0.37 m[IU]/L — ABNORMAL LOW (ref 0.40–4.50)

## 2018-02-11 DIAGNOSIS — M961 Postlaminectomy syndrome, not elsewhere classified: Secondary | ICD-10-CM | POA: Diagnosis not present

## 2018-02-11 DIAGNOSIS — G894 Chronic pain syndrome: Secondary | ICD-10-CM | POA: Diagnosis not present

## 2018-02-11 DIAGNOSIS — M79606 Pain in leg, unspecified: Secondary | ICD-10-CM | POA: Diagnosis not present

## 2018-02-11 DIAGNOSIS — M545 Low back pain: Secondary | ICD-10-CM | POA: Diagnosis not present

## 2018-02-14 ENCOUNTER — Other Ambulatory Visit: Payer: Self-pay | Admitting: Sports Medicine

## 2018-03-07 ENCOUNTER — Ambulatory Visit: Payer: Medicare Other

## 2018-03-11 DIAGNOSIS — Z79891 Long term (current) use of opiate analgesic: Secondary | ICD-10-CM | POA: Diagnosis not present

## 2018-03-11 DIAGNOSIS — G894 Chronic pain syndrome: Secondary | ICD-10-CM | POA: Diagnosis not present

## 2018-03-11 DIAGNOSIS — M79606 Pain in leg, unspecified: Secondary | ICD-10-CM | POA: Diagnosis not present

## 2018-03-11 DIAGNOSIS — M961 Postlaminectomy syndrome, not elsewhere classified: Secondary | ICD-10-CM | POA: Diagnosis not present

## 2018-03-11 DIAGNOSIS — Z79899 Other long term (current) drug therapy: Secondary | ICD-10-CM | POA: Diagnosis not present

## 2018-03-14 ENCOUNTER — Other Ambulatory Visit: Payer: Self-pay | Admitting: Sports Medicine

## 2018-03-28 ENCOUNTER — Other Ambulatory Visit: Payer: Self-pay | Admitting: Sports Medicine

## 2018-03-28 DIAGNOSIS — E785 Hyperlipidemia, unspecified: Secondary | ICD-10-CM

## 2018-03-31 DIAGNOSIS — M48062 Spinal stenosis, lumbar region with neurogenic claudication: Secondary | ICD-10-CM | POA: Diagnosis not present

## 2018-03-31 DIAGNOSIS — M5136 Other intervertebral disc degeneration, lumbar region: Secondary | ICD-10-CM | POA: Diagnosis not present

## 2018-03-31 DIAGNOSIS — Z9889 Other specified postprocedural states: Secondary | ICD-10-CM | POA: Diagnosis not present

## 2018-04-08 ENCOUNTER — Other Ambulatory Visit: Payer: Self-pay | Admitting: Sports Medicine

## 2018-04-08 DIAGNOSIS — G894 Chronic pain syndrome: Secondary | ICD-10-CM | POA: Diagnosis not present

## 2018-04-08 DIAGNOSIS — M961 Postlaminectomy syndrome, not elsewhere classified: Secondary | ICD-10-CM | POA: Diagnosis not present

## 2018-04-08 DIAGNOSIS — M545 Low back pain: Secondary | ICD-10-CM | POA: Diagnosis not present

## 2018-04-08 DIAGNOSIS — M79606 Pain in leg, unspecified: Secondary | ICD-10-CM | POA: Diagnosis not present

## 2018-05-07 ENCOUNTER — Encounter: Payer: Self-pay | Admitting: Sports Medicine

## 2018-05-07 ENCOUNTER — Ambulatory Visit (INDEPENDENT_AMBULATORY_CARE_PROVIDER_SITE_OTHER): Payer: Medicare Other | Admitting: Sports Medicine

## 2018-05-07 VITALS — BP 119/67 | HR 85 | Ht 74.0 in | Wt 268.0 lb

## 2018-05-07 DIAGNOSIS — I10 Essential (primary) hypertension: Secondary | ICD-10-CM | POA: Diagnosis not present

## 2018-05-07 DIAGNOSIS — I480 Paroxysmal atrial fibrillation: Secondary | ICD-10-CM | POA: Diagnosis not present

## 2018-05-07 DIAGNOSIS — I2699 Other pulmonary embolism without acute cor pulmonale: Secondary | ICD-10-CM | POA: Diagnosis not present

## 2018-05-07 DIAGNOSIS — E119 Type 2 diabetes mellitus without complications: Secondary | ICD-10-CM | POA: Diagnosis not present

## 2018-05-07 LAB — POCT GLYCOSYLATED HEMOGLOBIN (HGB A1C): Hemoglobin A1C: 5.8 % — AB (ref 4.0–5.6)

## 2018-05-07 LAB — POCT INR: INR: 2.6 (ref 2.0–3.0)

## 2018-05-07 NOTE — Assessment & Plan Note (Addendum)
INR is therapeutic at 2.6. He is looking at shoulder replacement and lumbar decompression. We would have to discontinue Coumadin and switch to Lovenox in the meantime. We tried to get some of the newer oral anticoagulants covered but they were too expensive. I do need him to come more often than once every 3 months for an INR.   Nurse visits every month.

## 2018-05-07 NOTE — Progress Notes (Signed)
Subjective:    CC: Follow-up  HPI: A. fib: Continue Coumadin.  We will need to transition to Lovenox perioperatively.  He will let me know when his surgery is happening.  We have not done an INR in a while.  Diabetes mellitus type 2: Well-controlled.  Hypertension: Well-controlled.  Obesity: Continues to lose weight.  I reviewed the past medical history, family history, social history, surgical history, and allergies today and no changes were needed.  Please see the problem list section below in epic for further details.  Past Medical History: Past Medical History:  Diagnosis Date  . Atrial fibrillation ([place])   . CAD (coronary artery disease)    Stent placed; [place]  . Congestive heart failure (CHF) ([place])   . DVT (deep vein thrombosis) in pregnancy   . High cholesterol   . Hypertension   . OSA (obstructive sleep apnea) 03/21/2017  . Pulmonary embolus [facility])    Past Surgical History: Past Surgical History:  Procedure Laterality Date  . HERNIA REPAIR    . IVC  2014  . lamenectomy    . left orchiectomy     Social History: Social History   Socioeconomic History  . Marital status: Married    Spouse name: Not on file  . Number of children: 5  . Years of education: Not on file  . Highest education level: Not on file  Occupational History  . Not on file  Social Needs  . Financial resource strain: Not on file  . Food insecurity:    Worry: Not on file    Inability: Not on file  . Transportation needs:    Medical: Not on file    Non-medical: Not on file  Tobacco Use  . Smoking status: Former Smoker    Last attempt to quit: 08/10/1999    Years since quitting: 18.7  . Smokeless tobacco: Never Used  Substance and Sexual Activity  . Alcohol use: Yes    Alcohol/week: 0.0 standard drinks    Comment: Rare  . Drug use: No  . Sexual activity: Yes    Partners: Female  Lifestyle  . Physical activity:    Days per week: Not on file    Minutes per session:  Not on file  . Stress: Not on file  Relationships  . Social connections:    Talks on phone: Not on file    Gets together: Not on file    Attends religious service: Not on file    Active member of club or organization: Not on file    Attends meetings of clubs or organizations: Not on file    Relationship status: Not on file  Other Topics Concern  . Not on file  Social History Narrative  . Not on file   Family History: Family History  Problem Relation Age of Onset  . Thyroid cancer Mother   . Depression Mother   . High Cholesterol Father   . High blood pressure Father   . Heart disease Father        Atrial fibrillation  . Stroke Unknown        Grandfather   Allergies: Allergies  Allergen Reactions  . Lipitor [Atorvastatin]     myalgias   Medications: See med rec.  Review of Systems: No fevers, chills, night sweats, weight loss, chest pain, or shortness of breath.   Objective:    General: Well Developed, well nourished, and in no acute distress.  Neuro: Alert and oriented x3, extra-ocular muscles  intact, sensation grossly intact.  HEENT: Normocephalic, atraumatic, pupils equal round reactive to light, neck supple, no masses, no lymphadenopathy, thyroid nonpalpable.  Skin: Warm and dry, no rashes. Cardiac: Regular rate and rhythm, no murmurs rubs or gallops, no lower extremity edema.  Respiratory: Clear to auscultation bilaterally. Not using accessory muscles, speaking in full sentences.  Diabetic Foot Exam Both feet were examined, there are no signs of ulceration or abnormal callus. Nails are unremarkable. Dorsalis pedis and posterior tibial pulses are palpable. Sensation is intact to sharp and monofilament. Shoes are of appropriate fitment.  Impression and Recommendations:    Controlled type 2 diabetes mellitus without complication, without long-term current use of insulin (HCC) Continues to be extremely well controlled. Foot exam today. No changes in  regimen. Next A1c in 6 months.  Essential hypertension Well-controlled, no change.  Morbid obesity ([place]) Continues to lose weight. Continue Topamax 100 twice a day. We will be discontinuing phentermine.  Paroxysmal atrial fibrillation (HCC) INR is therapeutic at 2.6. He is looking at shoulder replacement and lumbar decompression. We would have to discontinue Coumadin and switch to Lovenox in the meantime. We tried to get some of the newer oral anticoagulants covered but they were too expensive. I do need him to come more often than once every 3 months for an INR.   Nurse visits every month. ___________________________________________ Gwen Her. Dianah Field, M.D., ABFM., CAQSM. Primary Care and Sports Medicine [facility] [facility]  Adjunct Professor of [place] of [facility] of Medicine

## 2018-05-07 NOTE — Assessment & Plan Note (Signed)
Continues to be extremely well controlled. Foot exam today. No changes in regimen. Next A1c in 6 months.

## 2018-05-07 NOTE — Assessment & Plan Note (Signed)
Continues to lose weight. Continue Topamax 100 twice a day. We will be discontinuing phentermine.

## 2018-05-07 NOTE — Assessment & Plan Note (Signed)
Well-controlled, no change 

## 2018-05-13 DIAGNOSIS — M79606 Pain in leg, unspecified: Secondary | ICD-10-CM | POA: Diagnosis not present

## 2018-05-13 DIAGNOSIS — M961 Postlaminectomy syndrome, not elsewhere classified: Secondary | ICD-10-CM | POA: Diagnosis not present

## 2018-05-13 DIAGNOSIS — Z79899 Other long term (current) drug therapy: Secondary | ICD-10-CM | POA: Diagnosis not present

## 2018-05-13 DIAGNOSIS — M25519 Pain in unspecified shoulder: Secondary | ICD-10-CM | POA: Diagnosis not present

## 2018-05-13 DIAGNOSIS — G894 Chronic pain syndrome: Secondary | ICD-10-CM | POA: Diagnosis not present

## 2018-05-13 DIAGNOSIS — Z79891 Long term (current) use of opiate analgesic: Secondary | ICD-10-CM | POA: Diagnosis not present

## 2018-05-14 ENCOUNTER — Other Ambulatory Visit: Payer: Self-pay | Admitting: Sports Medicine

## 2018-06-03 ENCOUNTER — Other Ambulatory Visit: Payer: Self-pay | Admitting: Sports Medicine

## 2018-06-03 ENCOUNTER — Ambulatory Visit (INDEPENDENT_AMBULATORY_CARE_PROVIDER_SITE_OTHER): Payer: Medicare Other | Admitting: Sports Medicine

## 2018-06-03 DIAGNOSIS — E785 Hyperlipidemia, unspecified: Secondary | ICD-10-CM

## 2018-06-03 DIAGNOSIS — M19012 Primary osteoarthritis, left shoulder: Secondary | ICD-10-CM | POA: Diagnosis not present

## 2018-06-03 NOTE — Progress Notes (Signed)
Subjective:    CC: Left shoulder pain  HPI: Jeff Green is a pleasant 63 year old male with known end-stage left shoulder glenohumeral osteoarthritis.  We have been trying to get him to agree to shoulder arthroplasty.  He has had several injections, the most recent of which was about 4 months ago.  Lately he has had an increase in pain localized at the joint line, worse with motion.  He is desperate for relief.  He also understands that if we were to use an injection we could not use steroid in the hopes of not delaying an arthroplasty.  I reviewed the past medical history, family history, social history, surgical history, and allergies today and no changes were needed.  Please see the problem list section below in epic for further details.  Past Medical History: Past Medical History:  Diagnosis Date  . Atrial fibrillation ([place])   . CAD (coronary artery disease)    Stent placed; [place]  . Congestive heart failure (CHF) ([place])   . DVT (deep vein thrombosis) in pregnancy   . High cholesterol   . Hypertension   . OSA (obstructive sleep apnea) 03/21/2017  . Pulmonary embolus [facility])    Past Surgical History: Past Surgical History:  Procedure Laterality Date  . HERNIA REPAIR    . IVC  2014  . lamenectomy    . left orchiectomy     Social History: Social History   Socioeconomic History  . Marital status: Married    Spouse name: Not on file  . Number of children: 5  . Years of education: Not on file  . Highest education level: Not on file  Occupational History  . Not on file  Social Needs  . Financial resource strain: Not on file  . Food insecurity:    Worry: Not on file    Inability: Not on file  . Transportation needs:    Medical: Not on file    Non-medical: Not on file  Tobacco Use  . Smoking status: Former Smoker    Last attempt to quit: 08/10/1999    Years since quitting: 18.8  . Smokeless tobacco: Never Used  Substance and Sexual Activity  . Alcohol use:  Yes    Alcohol/week: 0.0 standard drinks    Comment: Rare  . Drug use: No  . Sexual activity: Yes    Partners: Female  Lifestyle  . Physical activity:    Days per week: Not on file    Minutes per session: Not on file  . Stress: Not on file  Relationships  . Social connections:    Talks on phone: Not on file    Gets together: Not on file    Attends religious service: Not on file    Active member of club or organization: Not on file    Attends meetings of clubs or organizations: Not on file    Relationship status: Not on file  Other Topics Concern  . Not on file  Social History Narrative  . Not on file   Family History: Family History  Problem Relation Age of Onset  . Thyroid cancer Mother   . Depression Mother   . High Cholesterol Father   . High blood pressure Father   . Heart disease Father        Atrial fibrillation  . Stroke Unknown        Grandfather   Allergies: Allergies  Allergen Reactions  . Lipitor [Atorvastatin]     myalgias   Medications: See  med rec.  Review of Systems: No fevers, chills, night sweats, weight loss, chest pain, or shortness of breath.   Objective:    General: Well Developed, well nourished, and in no acute distress.  Neuro: Alert and oriented x3, extra-ocular muscles intact, sensation grossly intact.  HEENT: Normocephalic, atraumatic, pupils equal round reactive to light, neck supple, no masses, no lymphadenopathy, thyroid nonpalpable.  Skin: Warm and dry, no rashes. Cardiac: Regular rate and rhythm, no murmurs rubs or gallops, no lower extremity edema.  Respiratory: Clear to auscultation bilaterally. Not using accessory muscles, speaking in full sentences. Left shoulder: Tender to palpation of the joint line, reproduction of pain with abduction and external rotation, lack of range of motion with external rotation to about 10 degrees.  Procedure: Real-time Ultrasound Guided Injection of left glenohumeral joint Device: GE Logiq E    Verbal informed consent obtained.  Time-out conducted.  Noted no overlying erythema, induration, or other signs of local infection.  Skin prepped in a sterile fashion.  Local anesthesia: Topical Ethyl chloride.  With sterile technique and under real time ultrasound guidance: 22-gauge spinal needle advanced into the joint, I then injected 3 cc lidocaine, 3 cc bupivacaine per patient request. Completed without difficulty  Pain immediately resolved suggesting accurate placement of the medication.  Advised to call if fevers/chills, erythema, induration, drainage, or persistent bleeding.  Images permanently stored and available for review in the ultrasound unit.  Impression: Technically successful ultrasound guided injection.  Impression and Recommendations:    Primary osteoarthritis of left shoulder Left glenohumeral injection as above, lidocaine and bupivacaine, no steroid because he is now agreeable to proceed with arthroplasty. Referral back to Dr. Tamera Punt. ___________________________________________ Gwen Her. Dianah Field, M.D., ABFM., CAQSM. Primary Care and Sports Medicine [facility]  Adjunct Professor of [place] of [facility] of Medicine

## 2018-06-03 NOTE — Assessment & Plan Note (Addendum)
Left glenohumeral injection as above, lidocaine and bupivacaine, no steroid because he is now agreeable to proceed with arthroplasty. Referral back to Dr. Tamera Punt.

## 2018-06-08 ENCOUNTER — Other Ambulatory Visit: Payer: Self-pay | Admitting: Sports Medicine

## 2018-06-09 ENCOUNTER — Ambulatory Visit (INDEPENDENT_AMBULATORY_CARE_PROVIDER_SITE_OTHER): Payer: Medicare Other | Admitting: Sports Medicine

## 2018-06-09 VITALS — BP 90/76 | HR 74 | Temp 97.6°F | Wt 268.0 lb

## 2018-06-09 DIAGNOSIS — E119 Type 2 diabetes mellitus without complications: Secondary | ICD-10-CM

## 2018-06-09 DIAGNOSIS — I10 Essential (primary) hypertension: Secondary | ICD-10-CM

## 2018-06-09 DIAGNOSIS — I480 Paroxysmal atrial fibrillation: Secondary | ICD-10-CM | POA: Diagnosis not present

## 2018-06-09 DIAGNOSIS — I2699 Other pulmonary embolism without acute cor pulmonale: Secondary | ICD-10-CM

## 2018-06-09 LAB — POCT INR: INR: 2.6 (ref 2.0–3.0)

## 2018-06-09 MED ORDER — GLIPIZIDE 5 MG PO TABS: 5.0000 mg | ORAL_TABLET | Freq: Two times a day (BID) | ORAL | 11 refills | Status: DC

## 2018-06-09 MED ORDER — VALSARTAN-HYDROCHLOROTHIAZIDE 160-12.5 MG PO TABS: 1.0000 | ORAL_TABLET | Freq: Every day | ORAL | 3 refills | Status: DC

## 2018-06-09 NOTE — Assessment & Plan Note (Signed)
Blood pressure slightly low, decreasing dose to valsartan/HCTZ 160/12.5. Return in 2 weeks for nurse visit blood pressure check.

## 2018-06-09 NOTE — Progress Notes (Signed)
Pt in today for INR check. Pt reports taking  Pt states he takes 1 tablet on Mon., Wed. And Friday and takes 1/2 tablets all the other days. Pt reports no change in diet, no recent hospitalizations, no bleeding or bruising, or SOB. Todays INR was 2.6. Results discussed with provider and no changes made. Pt to return in 4 weeks for next INR. Pt notified. Patient BP was 90/76 pulse 74. After speaking with provider dose was lowered and patient advised. Rx sent to pharmacy. Pt also needs glipizide refilled.

## 2018-06-10 DIAGNOSIS — M961 Postlaminectomy syndrome, not elsewhere classified: Secondary | ICD-10-CM | POA: Diagnosis not present

## 2018-06-10 DIAGNOSIS — G894 Chronic pain syndrome: Secondary | ICD-10-CM | POA: Diagnosis not present

## 2018-06-10 DIAGNOSIS — M25519 Pain in unspecified shoulder: Secondary | ICD-10-CM | POA: Diagnosis not present

## 2018-06-10 DIAGNOSIS — M79606 Pain in leg, unspecified: Secondary | ICD-10-CM | POA: Diagnosis not present

## 2018-06-24 ENCOUNTER — Ambulatory Visit (INDEPENDENT_AMBULATORY_CARE_PROVIDER_SITE_OTHER): Payer: Medicare Other | Admitting: Sports Medicine

## 2018-06-24 VITALS — BP 103/57 | HR 64 | Wt 260.0 lb

## 2018-06-24 DIAGNOSIS — N289 Disorder of kidney and ureter, unspecified: Secondary | ICD-10-CM

## 2018-06-24 DIAGNOSIS — I10 Essential (primary) hypertension: Secondary | ICD-10-CM | POA: Diagnosis not present

## 2018-06-24 NOTE — Progress Notes (Signed)
Established Patient Office Visit  Subjective:  Patient ID: Jeff Green, male    DOB: 14-Jan-1956  Age: 63 y.o. MRN: [id number]  CC:  Chief Complaint  Patient presents with  . Hypertension    HPI Jeff Green presents for blood pressure check. Denies chest pain, shortness of breath, dizziness or headaches.   Past Medical History:  Diagnosis Date  . Atrial fibrillation ([place])   . CAD (coronary artery disease)    Stent placed; [place]  . Congestive heart failure (CHF) ([place])   . DVT (deep vein thrombosis) in pregnancy   . High cholesterol   . Hypertension   . OSA (obstructive sleep apnea) 03/21/2017  . Pulmonary embolus [facility])     Past Surgical History:  Procedure Laterality Date  . HERNIA REPAIR    . IVC  2014  . lamenectomy    . left orchiectomy      Family History  Problem Relation Age of Onset  . Thyroid cancer Mother   . Depression Mother   . High Cholesterol Father   . High blood pressure Father   . Heart disease Father        Atrial fibrillation  . Stroke Unknown        Grandfather    Social History   Socioeconomic History  . Marital status: Married    Spouse name: Not on file  . Number of children: 5  . Years of education: Not on file  . Highest education level: Not on file  Occupational History  . Not on file  Social Needs  . Financial resource strain: Not on file  . Food insecurity:    Worry: Not on file    Inability: Not on file  . Transportation needs:    Medical: Not on file    Non-medical: Not on file  Tobacco Use  . Smoking status: Former Smoker    Last attempt to quit: 08/10/1999    Years since quitting: 18.8  . Smokeless tobacco: Never Used  Substance and Sexual Activity  . Alcohol use: Yes    Alcohol/week: 0.0 standard drinks    Comment: Rare  . Drug use: No  . Sexual activity: Yes    Partners: Female  Lifestyle  . Physical activity:    Days per week: Not on file    Minutes per session: Not on file  .  Stress: Not on file  Relationships  . Social connections:    Talks on phone: Not on file    Gets together: Not on file    Attends religious service: Not on file    Active member of club or organization: Not on file    Attends meetings of clubs or organizations: Not on file    Relationship status: Not on file  . Intimate partner violence:    Fear of current or ex partner: Not on file    Emotionally abused: Not on file    Physically abused: Not on file    Forced sexual activity: Not on file  Other Topics Concern  . Not on file  Social History Narrative  . Not on file    Outpatient Medications Prior to Visit  Medication Sig Dispense Refill  . Biotin 5000 MCG CAPS Take by mouth.    . carvedilol (COREG) 25 MG tablet Take 1 tablet (25 mg total) by mouth 2 (two) times daily with a meal. 180 tablet 3  . Cholecalciferol (VITAMIN D3) 5000 UNITS TABS Take by mouth 2 (two)  times daily between meals.    . Coenzyme Q10 (COQ-10 PO) Take by mouth.    . diltiazem (CARTIA XT) 120 MG 24 hr capsule Take 1 capsule (120 mg total) by mouth daily. 90 capsule 1  . fenofibrate 160 MG tablet TAKE ONE TABLET BY MOUTH DAILY 90 tablet 0  . glipiZIDE (GLUCOTROL) 5 MG tablet Take 1 tablet (5 mg total) by mouth 2 (two) times daily before a meal. 60 tablet 11  . GLUCOSAMINE CHONDROITIN COMPLX PO Take by mouth. Pt takes 1500-1288mg  per pt.    Marland Kitchen HYDROcodone-acetaminophen (NORCO) 10-325 MG tablet     . methocarbamol (ROBAXIN) 500 MG tablet Take 1.5 tablets (750 mg total) by mouth 3 (three) times daily. 135 tablet 3  . Multiple Vitamin (MULTIVITAMIN) capsule Take 1 capsule by mouth daily.    Marland Kitchen NEEDLE, DISP, 18 G 18G X 1-1/2" MISC Use as needed for testosterone injections 100 each 11  . oxyCODONE-acetaminophen (PERCOCET) 10-325 MG tablet Take 1 tablet by mouth every 8 (eight) hours as needed for pain. 90 tablet 0  . phentermine (ADIPEX-P) 37.5 MG tablet TAKE ONE HALF TABLET BY MOUTH EVERY MORNING 45 tablet 0  .  Probiotic Product (PROBIOTIC DAILY PO) Take by mouth.    . rosuvastatin (CRESTOR) 40 MG tablet TAKE ONE TABLET BY MOUTH DAILY 90 tablet 0  . Syringe/Needle, Disp, (SYRINGE 3CC/22GX1") 22G X 1" 3 ML MISC Use as needed for testosterone injections 100 each 11  . testosterone cypionate (DEPOTESTOSTERONE CYPIONATE) 200 MG/ML injection 55mL IM every other week. 10 mL 3  . topiramate (TOPAMAX) 100 MG tablet Take 1 tablet (100 mg total) by mouth 2 (two) times daily. 180 tablet 3  . valsartan-hydrochlorothiazide (DIOVAN-HCT) 160-12.5 MG tablet Take 1 tablet by mouth daily. 30 tablet 3  . warfarin (COUMADIN) 7.5 MG tablet Take 1 tablet (7.5 mg total) by mouth daily. 90 tablet 1   No facility-administered medications prior to visit.     Allergies  Allergen Reactions  . Lipitor [Atorvastatin]     myalgias    ROS Review of Systems    Objective:    Physical Exam  BP (!) 103/57   Pulse 64   Wt 260 lb (117.9 kg)   SpO2 99%   BMI 33.38 kg/m  Wt Readings from Last 3 Encounters:  06/24/18 260 lb (117.9 kg)  06/09/18 268 lb (121.6 kg)  05/07/18 268 lb (121.6 kg)     Health Maintenance Due  Topic Date Due  . OPHTHALMOLOGY EXAM  02/04/1968    There are no preventive care reminders to display for this patient.  Lab Results  Component Value Date   TSH 0.37 (L) 02/06/2018   Lab Results  Component Value Date   WBC 12.2 (H) 02/06/2018   HGB 16.8 02/06/2018   HCT 49.2 02/06/2018   MCV 91.6 02/06/2018   PLT 167 02/06/2018   Lab Results  Component Value Date   NA 139 02/06/2018   K 4.3 02/06/2018   CO2 25 02/06/2018   GLUCOSE 103 (H) 02/06/2018   BUN 44 (H) 02/06/2018   CREATININE 1.86 (H) 02/06/2018   BILITOT 0.5 02/06/2018   ALKPHOS 60 11/09/2016   AST 22 02/06/2018   ALT 25 02/06/2018   PROT 7.1 02/06/2018   ALBUMIN 4.3 11/09/2016   ALBUMIN 4.3 11/09/2016   CALCIUM 10.9 (H) 02/06/2018   Lab Results  Component Value Date   CHOL 162 02/06/2018   Lab Results  Component  Value Date   HDL  58 02/06/2018   Lab Results  Component Value Date   LDLCALC 84 02/06/2018   Lab Results  Component Value Date   TRIG 106 02/06/2018   Lab Results  Component Value Date   CHOLHDL 2.8 02/06/2018   Lab Results  Component Value Date   HGBA1C 5.8 (A) 05/07/2018      Assessment & Plan:  Hypertension - Per Dr Dianah Field, continue current medications. Follow up in 2 weeks for INR. Go to lab tomorrow morning. Labs will be ordered.     Problem List Items Addressed This Visit    Essential hypertension - Primary (Chronic)   Relevant Orders   CBC   Comprehensive metabolic panel   TSH      No orders of the defined types were placed in this encounter.   Follow-up: Return in about 2 weeks (around 07/08/2018) for INR check. Durene Romans, Monico Blitz, [place]

## 2018-06-24 NOTE — Patient Instructions (Signed)
Per Dr Dianah Field, continue current medications. Follow up in 2 weeks for INR. Go to lab tomorrow morning. Labs will be ordered.

## 2018-06-26 DIAGNOSIS — I10 Essential (primary) hypertension: Secondary | ICD-10-CM | POA: Diagnosis not present

## 2018-06-27 ENCOUNTER — Ambulatory Visit (INDEPENDENT_AMBULATORY_CARE_PROVIDER_SITE_OTHER): Payer: Medicare Other | Admitting: Sports Medicine

## 2018-06-27 ENCOUNTER — Encounter: Payer: Self-pay | Admitting: Sports Medicine

## 2018-06-27 DIAGNOSIS — N289 Disorder of kidney and ureter, unspecified: Secondary | ICD-10-CM | POA: Insufficient documentation

## 2018-06-27 DIAGNOSIS — E785 Hyperlipidemia, unspecified: Secondary | ICD-10-CM | POA: Diagnosis not present

## 2018-06-27 LAB — COMPREHENSIVE METABOLIC PANEL
AG Ratio: 1.8 (calc) (ref 1.0–2.5)
ALT: 16 U/L (ref 9–46)
AST: 21 U/L (ref 10–35)
Albumin: 4.6 g/dL (ref 3.6–5.1)
Alkaline phosphatase (APISO): 32 U/L — ABNORMAL LOW (ref 35–144)
BUN/Creatinine Ratio: 21 (calc) (ref 6–22)
BUN: 51 mg/dL — ABNORMAL HIGH (ref 7–25)
CO2: 27 mmol/L (ref 20–32)
Calcium: 10.3 mg/dL (ref 8.6–10.3)
Chloride: 109 mmol/L (ref 98–110)
Creat: 2.39 mg/dL — ABNORMAL HIGH (ref 0.70–1.25)
Globulin: 2.6 g/dL (calc) (ref 1.9–3.7)
Glucose, Bld: 93 mg/dL (ref 65–99)
Potassium: 4.7 mmol/L (ref 3.5–5.3)
Total Bilirubin: 0.6 mg/dL (ref 0.2–1.2)
Total Protein: 7.2 g/dL (ref 6.1–8.1)

## 2018-06-27 LAB — CBC
HCT: 39.4 % (ref 38.5–50.0)
Hemoglobin: 13.1 g/dL — ABNORMAL LOW (ref 13.2–17.1)
MCH: 30.1 pg (ref 27.0–33.0)
MCHC: 33.2 g/dL (ref 32.0–36.0)
MCV: 90.6 fL (ref 80.0–100.0)
MPV: 14.7 fL — ABNORMAL HIGH (ref 7.5–12.5)
Platelets: 134 10*3/uL — ABNORMAL LOW (ref 140–400)
RBC: 4.35 10*6/uL (ref 4.20–5.80)
RDW: 13.1 % (ref 11.0–15.0)
WBC: 5.3 10*3/uL (ref 3.8–10.8)

## 2018-06-27 LAB — COMPREHENSIVE METABOLIC PANEL WITH GFR: Sodium: 142 mmol/L (ref 135–146)

## 2018-06-27 LAB — TSH: TSH: 1.64 mIU/L (ref 0.40–4.50)

## 2018-06-27 MED ORDER — FENOFIBRATE 160 MG PO TABS: 160.0000 mg | ORAL_TABLET | Freq: Every day | ORAL | 0 refills | Status: DC

## 2018-06-27 MED ORDER — TESTOSTERONE CYPIONATE 200 MG/ML IM SOLN: INTRAMUSCULAR | 3 refills | Status: DC

## 2018-06-27 NOTE — Assessment & Plan Note (Signed)
Moderate renal insufficiency, anemia, the anemia is likely secondary to the renal insufficiency. He needs to hold his valsartan/HCTZ for now, his renal function is in the prerenal range, I would like him to come in for a couple of liters of IV fluids as well as early as possible this morning (Friday).

## 2018-06-27 NOTE — Assessment & Plan Note (Signed)
Moderate renal insufficiency with normocytic anemia likely secondary to renal insufficiency. Stopping valsartan/HCTZ. I would like labs later today after he runs 2 L of IV fluids. In 2 weeks would like to see him back, if blood pressures creep up we will simply use valsartan without HCTZ. He has had a fantastic weight loss and this is likely why we are able to creep down off of his blood pressure medication.

## 2018-06-27 NOTE — Progress Notes (Addendum)
Subjective:    CC: Abnormal labs  HPI: Jeff Green comes in, we did notice some abnormal labs.  He has been feeling very tired, he has been looking somewhat pale.  He is here to be evaluated regarding his abnormal labs, we have not yet decided what we are going to do with them.  I reviewed the past medical history, family history, social history, surgical history, and allergies today and no changes were needed.  Please see the problem list section below in epic for further details.  Past Medical History: Past Medical History:  Diagnosis Date  . Atrial fibrillation ([place])   . CAD (coronary artery disease)    Stent placed; [place]  . Congestive heart failure (CHF) ([place])   . DVT (deep vein thrombosis) in pregnancy   . High cholesterol   . Hypertension   . OSA (obstructive sleep apnea) 03/21/2017  . Pulmonary embolus [facility])    Past Surgical History: Past Surgical History:  Procedure Laterality Date  . HERNIA REPAIR    . IVC  2014  . lamenectomy    . left orchiectomy     Social History: Social History   Socioeconomic History  . Marital status: Married    Spouse name: Not on file  . Number of children: 5  . Years of education: Not on file  . Highest education level: Not on file  Occupational History  . Not on file  Social Needs  . Financial resource strain: Not on file  . Food insecurity:    Worry: Not on file    Inability: Not on file  . Transportation needs:    Medical: Not on file    Non-medical: Not on file  Tobacco Use  . Smoking status: Former Smoker    Last attempt to quit: 08/10/1999    Years since quitting: 18.8  . Smokeless tobacco: Never Used  Substance and Sexual Activity  . Alcohol use: Yes    Alcohol/week: 0.0 standard drinks    Comment: Rare  . Drug use: No  . Sexual activity: Yes    Partners: Female  Lifestyle  . Physical activity:    Days per week: Not on file    Minutes per session: Not on file  . Stress: Not on file  Relationships   . Social connections:    Talks on phone: Not on file    Gets together: Not on file    Attends religious service: Not on file    Active member of club or organization: Not on file    Attends meetings of clubs or organizations: Not on file    Relationship status: Not on file  Other Topics Concern  . Not on file  Social History Narrative  . Not on file   Family History: Family History  Problem Relation Age of Onset  . Thyroid cancer Mother   . Depression Mother   . High Cholesterol Father   . High blood pressure Father   . Heart disease Father        Atrial fibrillation  . Stroke Unknown        Grandfather   Allergies: Allergies  Allergen Reactions  . Lipitor [Atorvastatin]     myalgias   Medications: See med rec.  Review of Systems: No fevers, chills, night sweats, weight loss, chest pain, or shortness of breath.   Objective:    General: Well Developed, well nourished, and in no acute distress.  Neuro: Alert and oriented x3, extra-ocular muscles intact, sensation  grossly intact.  HEENT: Normocephalic, atraumatic, pupils equal round reactive to light, neck supple, no masses, no lymphadenopathy, thyroid nonpalpable.  Skin: Warm and dry, no rashes. Cardiac: Regular rate and rhythm, no murmurs rubs or gallops, no lower extremity edema.  Respiratory: Clear to auscultation bilaterally. Not using accessory muscles, speaking in full sentences.  20-gauge angiocatheter placed in the right cubital vein, I infused 2 L of normal saline.  Impression and Recommendations:    Renal insufficiency Moderate renal insufficiency with normocytic anemia likely secondary to renal insufficiency. Stopping valsartan/HCTZ. I would like labs later today after he runs 2 L of IV fluids. In 2 weeks would like to see him back, if blood pressures creep up we will simply use valsartan without HCTZ. He has had a fantastic weight loss and this is likely why we are able to creep down off of his blood  pressure medication.  I spent 40 minutes with this patient, greater than 50% was face-to-face time counseling regarding the above diagnoses, specifically discussing the pathophysiology of renal insufficiency and anemia of renal disease.  This time was separate from the time spent performing placing the needle in the vein. ___________________________________________ Gwen Her. Dianah Field, M.D., ABFM., CAQSM. Primary Care and Sports Medicine [facility]  Adjunct Professor of [place] [employer] [facility]

## 2018-06-28 LAB — COMPREHENSIVE METABOLIC PANEL WITH GFR
BUN: 45 mg/dL — ABNORMAL HIGH (ref 7–25)
Calcium: 10 mg/dL (ref 8.6–10.3)
Total Bilirubin: 0.6 mg/dL (ref 0.2–1.2)

## 2018-06-28 LAB — COMPREHENSIVE METABOLIC PANEL
AG Ratio: 1.7 (calc) (ref 1.0–2.5)
ALT: 15 U/L (ref 9–46)
AST: 21 U/L (ref 10–35)
Albumin: 4.4 g/dL (ref 3.6–5.1)
Alkaline phosphatase (APISO): 31 U/L — ABNORMAL LOW (ref 35–144)
BUN/Creatinine Ratio: 21 (calc) (ref 6–22)
CO2: 23 mmol/L (ref 20–32)
Chloride: 109 mmol/L (ref 98–110)
Creat: 2.16 mg/dL — ABNORMAL HIGH (ref 0.70–1.25)
Globulin: 2.6 g/dL (calc) (ref 1.9–3.7)
Glucose, Bld: 79 mg/dL (ref 65–99)
Potassium: 4.6 mmol/L (ref 3.5–5.3)
Sodium: 141 mmol/L (ref 135–146)
Total Protein: 7 g/dL (ref 6.1–8.1)

## 2018-06-28 LAB — CBC
HCT: 36.5 % — ABNORMAL LOW (ref 38.5–50.0)
Hemoglobin: 12.7 g/dL — ABNORMAL LOW (ref 13.2–17.1)
MCH: 31.3 pg (ref 27.0–33.0)
MCHC: 34.8 g/dL (ref 32.0–36.0)
MCV: 89.9 fL (ref 80.0–100.0)
MPV: 14.1 fL — ABNORMAL HIGH (ref 7.5–12.5)
Platelets: 131 10*3/uL — ABNORMAL LOW (ref 140–400)
RBC: 4.06 10*6/uL — ABNORMAL LOW (ref 4.20–5.80)
RDW: 13.1 % (ref 11.0–15.0)
WBC: 5.8 10*3/uL (ref 3.8–10.8)

## 2018-06-28 LAB — ABN TEST REFUSAL: ABN TEST REFUSED: 5616

## 2018-06-28 LAB — RETICULOCYTES
ABS Retic: 48720 {cells}/uL (ref 25000–9000)
Retic Ct Pct: 1.2 %

## 2018-07-04 ENCOUNTER — Other Ambulatory Visit: Payer: Self-pay | Admitting: Sports Medicine

## 2018-07-04 DIAGNOSIS — M19012 Primary osteoarthritis, left shoulder: Secondary | ICD-10-CM

## 2018-07-07 ENCOUNTER — Telehealth: Payer: Self-pay

## 2018-07-07 DIAGNOSIS — E291 Testicular hypofunction: Secondary | ICD-10-CM

## 2018-07-07 MED ORDER — TESTOSTERONE CYPIONATE 200 MG/ML IM SOLN: INTRAMUSCULAR | 3 refills | Status: DC

## 2018-07-07 NOTE — Telephone Encounter (Signed)
Left pt msg RX sent

## 2018-07-07 NOTE — Telephone Encounter (Signed)
Testosterone injections cheaper for pt at [facility]   I have called Jeff Green and spoke with Loma Sousa who cancelled RX that was sent 06/27/18.  RX pended to go to [facility]

## 2018-07-08 DIAGNOSIS — M79606 Pain in leg, unspecified: Secondary | ICD-10-CM | POA: Diagnosis not present

## 2018-07-08 DIAGNOSIS — G894 Chronic pain syndrome: Secondary | ICD-10-CM | POA: Diagnosis not present

## 2018-07-08 DIAGNOSIS — M961 Postlaminectomy syndrome, not elsewhere classified: Secondary | ICD-10-CM | POA: Diagnosis not present

## 2018-07-08 DIAGNOSIS — M25519 Pain in unspecified shoulder: Secondary | ICD-10-CM | POA: Diagnosis not present

## 2018-07-10 ENCOUNTER — Encounter: Payer: Self-pay | Admitting: Rehabilitative and Restorative Service Providers"

## 2018-07-10 ENCOUNTER — Ambulatory Visit (INDEPENDENT_AMBULATORY_CARE_PROVIDER_SITE_OTHER): Payer: Medicare Other | Admitting: Rehabilitative and Restorative Service Providers"

## 2018-07-10 ENCOUNTER — Other Ambulatory Visit: Payer: Self-pay

## 2018-07-10 DIAGNOSIS — M25612 Stiffness of left shoulder, not elsewhere classified: Secondary | ICD-10-CM | POA: Diagnosis not present

## 2018-07-10 DIAGNOSIS — G8929 Other chronic pain: Secondary | ICD-10-CM

## 2018-07-10 DIAGNOSIS — M25512 Pain in left shoulder: Secondary | ICD-10-CM | POA: Diagnosis not present

## 2018-07-10 DIAGNOSIS — R293 Abnormal posture: Secondary | ICD-10-CM | POA: Diagnosis not present

## 2018-07-10 DIAGNOSIS — R29898 Other symptoms and signs involving the musculoskeletal system: Secondary | ICD-10-CM

## 2018-07-10 DIAGNOSIS — R531 Weakness: Secondary | ICD-10-CM | POA: Diagnosis not present

## 2018-07-10 NOTE — Therapy (Signed)
[place] [address] [place] [place], [place], [address] Phone: [phone]   Fax:  [phone]  Physical Therapy Evaluation  Patient Details  Name: Jeff Green MRN: [id number] Date of Birth: 11-Apr-1956 Referring Provider (PT): Dr Dianah Field    Encounter Date: 07/10/2018  PT End of Session - 07/10/18 0934    Visit Number  1    Number of Visits  12    Date for PT Re-Evaluation  08/21/18    PT Start Time  0934    PT Stop Time  1048    PT Time Calculation (min)  74 min    Activity Tolerance  Patient tolerated treatment well       Past Medical History:  Diagnosis Date  . Atrial fibrillation ([place])   . CAD (coronary artery disease)    Stent placed; [place]  . Congestive heart failure (CHF) ([place])   . DVT (deep vein thrombosis) in pregnancy   . High cholesterol   . Hypertension   . OSA (obstructive sleep apnea) 03/21/2017  . Pulmonary embolus [facility])     Past Surgical History:  Procedure Laterality Date  . HERNIA REPAIR    . IVC  2014  . lamenectomy    . left orchiectomy      There were no vitals filed for this visit.   Subjective Assessment - 07/10/18 0940    Subjective  Patient reports that he has had pain in the Lt shoulder for several years. He has had increased pain in the past few months. He has pain and weakness in the Lt shoulder.     Pertinent History  HTN; pre-diabetic; multiple lumbar surgeries; arthritis     Patient Stated Goals  to increase strength in the lt shoulder before surgery     Currently in Pain?  Yes    Pain Score  8     Pain Location  Shoulder    Pain Orientation  Left    Pain Descriptors / Indicators  Sharp;Constant;Nagging    Pain Type  Chronic pain    Pain Radiating Towards  to proximal forearm     Pain Onset  More than a month ago    Pain Frequency  Constant    Aggravating Factors   any functional use; worse at night; certain movements    Pain Relieving Factors   supporting Lt UE with weight through the Lt UE          [facility] PT Assessment - 07/10/18 0001      Assessment   Medical Diagnosis  Lt shoulder pain     Referring Provider (PT)  Dr Dianah Field     Onset Date/Surgical Date  04/23/18   pain for several years    Hand Dominance  Right    Next MD Visit  07/11/2018    Prior Therapy  yes here for LBP       Precautions   Precautions  None      Restrictions   Weight Bearing Restrictions  No      Balance Screen   Has the patient fallen in the past 6 months  No    Has the patient had a decrease in activity level because of a fear of falling?   No    Is the patient reluctant to leave their home because of a fear of falling?   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     Leisure  walking dog 2-3 times/day ~ 30 min       Observation/Other Assessments   Focus on Therapeutic Outcomes (FOTO)   47% limitation       Sensation   Additional Comments  WFL's lt UE       Posture/Postural Control   Posture Comments  head forward; shoudlers rounded and elevated; head of the humerus anterior in orientation; scapula abducted and rotated along the thoracic wall       AROM   Right Shoulder Extension  61 Degrees    Right Shoulder Flexion  149 Degrees    Right Shoulder ABduction  149 Degrees    Right Shoulder Internal Rotation  30 Degrees    Right Shoulder External Rotation  67 Degrees    Left Shoulder Extension  29 Degrees    Left Shoulder Flexion  128 Degrees    Left Shoulder ABduction  112 Degrees    Left Shoulder Internal Rotation  14 Degrees    Left Shoulder External Rotation  54 Degrees    Cervical Flexion  67    Cervical Extension  30    Cervical - Right Side Bend  50    Cervical - Left Side Bend  47    Cervical - Right Rotation  60    Cervical - Left Rotation  60      Strength   Right/Left Shoulder  --   Rt shoulder 5/5    Left  Shoulder Flexion  4-/5    Left Shoulder Extension  4/5    Left Shoulder ABduction  3+/5    Left Shoulder Internal Rotation  4/5    Left Shoulder External Rotation  3+/5      Palpation   Palpation comment  muscular tightness through the Lt shoulder girdle in pecs; anterior deltiod; biceps; upper trap; leveator       Special Tests   Other special tests  (+) neural tension test Lt UE with shoulder at 55 deg abduction                 Objective measurements completed on examination: See above findings.      [place] Adult PT Treatment/Exercise - 07/10/18 0001      Neuro Re-ed    Neuro Re-ed Details   education re posterior shoulder girdle and position of scapula along the thoracic wall       Shoulder Exercises: Standing   Other Standing Exercises  scap squeeze with noodle 10 sec x 5 - discomfort at 5 reps       Shoulder Exercises: Isometric Strengthening   Flexion  5X5"    Extension  5X5"    External Rotation  5X5"    ABduction  5X5"      Shoulder Exercises: Stretch   Other Shoulder Stretches  AAROM lt shoulder flexion supine assisting with Rt UE 10 sec hold x 3 reps       Moist Heat Therapy   Number Minutes Moist Heat  20 Minutes    Moist Heat Location  Shoulder      Electrical Stimulation   Electrical Stimulation Location  Lt shoulder     Electrical Stimulation Action  IFC    Electrical Stimulation Parameters  to tolerance    Electrical Stimulation Goals  Pain;Tone      Manual Therapy   Manual therapy comments  application of biofreeze post treatment  PT Education - 07/10/18 1024    Education Details  HEP     Person(s) Educated  Patient    Methods  Explanation;Demonstration;Tactile cues;Verbal cues;Handout    Comprehension  Verbalized understanding;Returned demonstration;Verbal cues required;Tactile cues required          PT Long Term Goals - 07/10/18 1043      PT LONG TERM GOAL #1   Title  I with HEP for shoulder 08/21/2018    Time   6    Period  Weeks    Status  New      PT LONG TERM GOAL #2   Title  Increase strength Lt UE by 1/2 to 1 muscle grade 08/21/2018    Time  6    Period  Weeks    Status  New      PT LONG TERM GOAL #3   Title  demo improved Lt shoulder motion to allow patient  to consistently reach to the top kitchen shelf 08/21/2018    Time  6    Period  Weeks    Status  New      PT LONG TERM GOAL #4   Title  Improve posterior shoulder girdle strength and control with patient to demonstrate improved scapular stability with elevation of Lt UE 08/21/2018    Time  6    Period  Weeks    Status  New      PT LONG TERM GOAL #5   Title  Improve FOTO to </= 35% limitation 08/21/2018    Time  6    Period  Weeks    Status  New             Plan - 07/10/18 1031    Clinical Impression Statement  Patient presents with chronic Lt shoudler pain and dysfunction. He has constant shoulder pain which is increased with functional activitiy. Patient has poor posture and alignment; poor scapular control; scapular dyskinesis; decreased rom; decreased strength lt UE. he will benefit from PT to improve strength and Rom - preparing for TSA.     Personal Factors and Comorbidities  Comorbidity 1    Comorbidities  HtN; orthopedic problems LB and shoulder     Examination-Activity Limitations  Reach Overhead;Caring for Others;Other    Examination-Participation Restrictions  Other    Clinical Decision Making  Low    Rehab Potential  Good    PT Frequency  2x / week    PT Treatment/Interventions  Patient/family education;ADLs/Self Care Home Management;Cryotherapy;Electrical Stimulation;Iontophoresis 4mg /ml Dexamethasone;Moist Heat;Ultrasound;Dry needling;Manual techniques;Therapeutic activities;Therapeutic exercise;Neuromuscular re-education;Taping    PT Next Visit Plan  review HEP; progress with strengthening posterior shoulder girdle and Lt shoulder/UE; modalities as indicated     PT Home Exercise Plan  Access Code: [email or web address]      Consulted and Agree with Plan of Care  Patient       Patient will benefit from skilled therapeutic intervention in order to improve the following deficits and impairments:  Postural dysfunction, Improper body mechanics, Pain, Increased fascial restricitons, Increased muscle spasms, Decreased strength, Decreased range of motion, Decreased mobility, Decreased activity tolerance  Visit Diagnosis: Chronic left shoulder pain - Plan: PT plan of care cert/re-cert  Weakness generalized - Plan: PT plan of care cert/re-cert  Abnormal posture - Plan: PT plan of care cert/re-cert  Other symptoms and signs involving the musculoskeletal system - Plan: PT plan of care cert/re-cert  Stiffness of left shoulder, not elsewhere classified - Plan: PT plan of care cert/re-cert  Problem List Patient Active Problem List   Diagnosis Date Noted  . Renal insufficiency 06/27/2018  . Seborrheic keratosis 05/29/2017  . Insomnia 04/05/2017  . OSA (obstructive sleep apnea) 03/21/2017  . Primary osteoarthritis of left shoulder 11/28/2016  . Controlled type 2 diabetes mellitus without complication, without long-term current use of insulin ([place]) 06/08/2016  . Seborrheic dermatitis 06/08/2016  . Morbid obesity ([place]) 06/08/2016  . Ruptured tympanic membrane, bilateral 01/27/2016  . Annual physical exam 01/25/2016  . Paroxysmal atrial fibrillation ([place]) 06/15/2015  . Polycythemia, secondary 08/26/2014  . Hemochromatosis 08/26/2014  . Multiple pulmonary nodules 08/16/2014  . Testicular cancer ([place]) 08/12/2014  . Hyperlipidemia 08/10/2014  . Hypogonadism in male 08/10/2014  . Essential hypertension 08/10/2014  . Spinal stenosis of lumbar region 08/10/2014  . Recurrent pulmonary emboli ([place]) 08/10/2014  . CAD (coronary artery disease), native coronary artery 08/10/2014    Ebone Alcivar Nilda Simmer PT, MPH  07/10/2018, 10:48 AM  [facility] [place], [place], [address] Phone: [phone]   Fax:  [phone]  Name: Jeff Green MRN: [id number] Date of Birth: 13-Aug-1955

## 2018-07-10 NOTE — Patient Instructions (Addendum)
Neurovascular: Median Nerve Glide With Cervical Bias - Supine    Lie with neck supported, right arm out to side, elbow straight, thumb down, fingers and wrist bent back. Slowly move opposite side ear toward shoulder as far as possible without pain. 60 sec hold  Repeat _2___ times per set.  Do __2__ sessions per day   Thoracic Lift    Press shoulders down. Then lift mid-thoracic spine (area between the shoulder blades). Lift the breastbone slightly. Hold _5-10__ seconds. Relax. Repeat _5-10__ times.     Flexors Stick Stretch, Supine    Lie on back, stick in both hands above chest. Extend both arms over head as far as possible. Hold __5-10_ seconds. Repeat __5-10_ times per session. Do _2_ sessions per day.  Access Code: [email or web address]  URL: [email or web address]  Date: 07/10/2018  Prepared by: Gillermo Murdoch   Exercises  Standing Scapular Retraction - 10 reps - 1 sets - 10 hold - 3x daily - 7x weekly  Supine Shoulder Flexion AAROM - 3 reps - 1 sets - 10 sec hold - 2x daily - 7x weekly  Seated Scapular Retraction - 5 reps - 1 sets - 5-10sec hold - 2x daily - 7x weekly  Isometric Shoulder Abduction at Wall - 5 reps - 1 sets - 5 sec hold - 2x daily - 7x weekly  Isometric Shoulder Extension at Wall - 5 reps - 1 sets - 5 sec hold - 2x daily - 7x weekly  Isometric Shoulder Flexion at Wall - 5 reps - 1 sets - 5 sec hold - 2x daily - 7x weekly  Isometric Shoulder External Rotation at Wall - 5 reps - 1 sets - 5 sec hold - 2x daily - 7x weekly

## 2018-07-11 ENCOUNTER — Encounter: Payer: Self-pay | Admitting: Sports Medicine

## 2018-07-11 ENCOUNTER — Telehealth: Payer: Self-pay | Admitting: Rehabilitative and Restorative Service Providers"

## 2018-07-11 ENCOUNTER — Ambulatory Visit (INDEPENDENT_AMBULATORY_CARE_PROVIDER_SITE_OTHER): Payer: Medicare Other | Admitting: Sports Medicine

## 2018-07-11 VITALS — BP 132/72 | HR 76 | Ht 74.0 in | Wt 268.0 lb

## 2018-07-11 DIAGNOSIS — I10 Essential (primary) hypertension: Secondary | ICD-10-CM

## 2018-07-11 DIAGNOSIS — M19012 Primary osteoarthritis, left shoulder: Secondary | ICD-10-CM

## 2018-07-11 DIAGNOSIS — I480 Paroxysmal atrial fibrillation: Secondary | ICD-10-CM | POA: Diagnosis not present

## 2018-07-11 DIAGNOSIS — I2699 Other pulmonary embolism without acute cor pulmonale: Secondary | ICD-10-CM | POA: Diagnosis not present

## 2018-07-11 LAB — POCT INR: INR: 1.4 — AB (ref 2.0–3.0)

## 2018-07-11 NOTE — Assessment & Plan Note (Signed)
INR subtherapeutic.  Change to 1/2 tablet on Wednesdays and Fridays and 1 tablet on other days. Currently on 7.5 mg tablets. Recheck in 1 Week in nurse visit.

## 2018-07-11 NOTE — Patient Instructions (Signed)
INR subtherapeutic. Change to 1/2 tablet on Wednesdays and Fridays and 1 full tablet on other days. Currently on 7.5 mg tablets. Recheck in 1 Week in nurse visit.

## 2018-07-11 NOTE — Assessment & Plan Note (Signed)
And severe pain, unfortunately the shoulder arthroplasty was canceled due to the COVID-19 drama. Lidocaine/bupivacaine injection today. I would like him to do some physical therapy, because our department is closed down we will use an outside private therapist.

## 2018-07-11 NOTE — Assessment & Plan Note (Signed)
We ran 2 L of IV fluids about 2 weeks ago. He was in acute on chronic renal insufficiency with a creatinine up to 2. Rechecking today, there is also some normocytic anemia likely anemia of renal disease, rechecking CBC now. Blood pressures remained stable off of valsartan/HCTZ so we will continue to hold this for now.

## 2018-07-11 NOTE — Telephone Encounter (Signed)
Thank you, I ended up sending him to [facility], I think they are open still.

## 2018-07-11 NOTE — Progress Notes (Signed)
Subjective:    CC: Follow-up dehydration  HPI: Jeff Green returns, he was overtreated with his valsartan/HCTZ, and likely had an element of iatrogenic azotemia.  We gave him 2 L of IV fluids and stopped his valsartan/HCTZ.  He feels better today, his blood pressure is well controlled.  He is having severe pain in his left shoulder, he has end-stage osteoarthritis, was scheduled for shoulder arthroplasty but unfortunately with the current COVID-19 drama this procedure was canceled.  He is requesting a lidocaine/bupivacaine injection today for at least some relief.  In addition he is also due for an INR check.  I reviewed the past medical history, family history, social history, surgical history, and allergies today and no changes were needed.  Please see the problem list section below in epic for further details.  Past Medical History: Past Medical History:  Diagnosis Date  . Atrial fibrillation ([place])   . CAD (coronary artery disease)    Stent placed; [place]  . Congestive heart failure (CHF) ([place])   . DVT (deep vein thrombosis) in pregnancy   . High cholesterol   . Hypertension   . OSA (obstructive sleep apnea) 03/21/2017  . Pulmonary embolus [facility])    Past Surgical History: Past Surgical History:  Procedure Laterality Date  . HERNIA REPAIR    . IVC  2014  . lamenectomy    . left orchiectomy     Social History: Social History   Socioeconomic History  . Marital status: Married    Spouse name: Not on file  . Number of children: 5  . Years of education: Not on file  . Highest education level: Not on file  Occupational History  . Not on file  Social Needs  . Financial resource strain: Not on file  . Food insecurity:    Worry: Not on file    Inability: Not on file  . Transportation needs:    Medical: Not on file    Non-medical: Not on file  Tobacco Use  . Smoking status: Former Smoker    Last attempt to quit: 08/10/1999    Years since quitting: 18.9  .  Smokeless tobacco: Never Used  Substance and Sexual Activity  . Alcohol use: Yes    Alcohol/week: 0.0 standard drinks    Comment: Rare  . Drug use: No  . Sexual activity: Yes    Partners: Female  Lifestyle  . Physical activity:    Days per week: Not on file    Minutes per session: Not on file  . Stress: Not on file  Relationships  . Social connections:    Talks on phone: Not on file    Gets together: Not on file    Attends religious service: Not on file    Active member of club or organization: Not on file    Attends meetings of clubs or organizations: Not on file    Relationship status: Not on file  Other Topics Concern  . Not on file  Social History Narrative  . Not on file   Family History: Family History  Problem Relation Age of Onset  . Thyroid cancer Mother   . Depression Mother   . High Cholesterol Father   . High blood pressure Father   . Heart disease Father        Atrial fibrillation  . Stroke Unknown        Grandfather   Allergies: Allergies  Allergen Reactions  . Lipitor [Atorvastatin]     myalgias  Medications: See med rec.  Review of Systems: No fevers, chills, night sweats, weight loss, chest pain, or shortness of breath.   Objective:    General: Well Developed, well nourished, and in no acute distress.  Neuro: Alert and oriented x3, extra-ocular muscles intact, sensation grossly intact.  HEENT: Normocephalic, atraumatic, pupils equal round reactive to light, neck supple, no masses, no lymphadenopathy, thyroid nonpalpable.  Skin: Warm and dry, no rashes. Cardiac: Regular rate and rhythm, no murmurs rubs or gallops, no lower extremity edema.  Respiratory: Clear to auscultation bilaterally. Not using accessory muscles, speaking in full sentences.  Procedure: Real-time Ultrasound Guided injection of the left glenohumeral joint Device: GE Logiq E  Verbal informed consent obtained.  Time-out conducted.  Noted no overlying erythema, induration,  or other signs of local infection.  Skin prepped in a sterile fashion.  Local anesthesia: Topical Ethyl chloride.  With sterile technique and under real time ultrasound guidance:  22-gauge spinal needle advanced to the humeral head, contacted bone and then injected 3 cc lidocaine, 3 cc bupivacaine Completed without difficulty  Pain immediately resolved suggesting accurate placement of the medication.  Advised to call if fevers/chills, erythema, induration, drainage, or persistent bleeding.  Images permanently stored and available for review in the ultrasound unit.  Impression: Technically successful ultrasound guided injection.  Impression and Recommendations:    Essential hypertension We ran 2 L of IV fluids about 2 weeks ago. He was in acute on chronic renal insufficiency with a creatinine up to 2. Rechecking today, there is also some normocytic anemia likely anemia of renal disease, rechecking CBC now. Blood pressures remained stable off of valsartan/HCTZ so we will continue to hold this for now.  Primary osteoarthritis of left shoulder And severe pain, unfortunately the shoulder arthroplasty was canceled due to the COVID-19 drama. Lidocaine/bupivacaine injection today. I would like him to do some physical therapy, because our department is closed down we will use an outside private therapist.  Paroxysmal atrial fibrillation ([place]) INR subtherapeutic.  Change to 1/2 tablet on Wednesdays and Fridays and 1 tablet on other days. Currently on 7.5 mg tablets. Recheck in 1 Week in nurse visit.   ___________________________________________ Gwen Her. Dianah Field, M.D., ABFM., CAQSM. Primary Care and Sports Medicine [facility]  Adjunct Professor of [employer]

## 2018-07-11 NOTE — Telephone Encounter (Signed)
Called Rich to let him know that all [facility] clinics are closed until July 28, 2018 due to [place] virus. He scheduled for appointments starting April 6th. We will contact Rich with information re-evisits and phone calls to follow up in the next two weeks.   Wai Litt P. Helene Kelp PT, MPH 07/11/18 11:47 AM

## 2018-07-12 LAB — BASIC METABOLIC PANEL
BUN: 25 mg/dL (ref 7–25)
CO2: 22 mmol/L (ref 20–32)
Calcium: 9.9 mg/dL (ref 8.6–10.3)
Chloride: 110 mmol/L (ref 98–110)
Creat: 1.79 mg/dL — ABNORMAL HIGH (ref 0.70–1.25)
Glucose, Bld: 115 mg/dL — ABNORMAL HIGH (ref 65–99)
Potassium: 3.9 mmol/L (ref 3.5–5.3)

## 2018-07-12 LAB — CBC
HCT: 36.7 % — ABNORMAL LOW (ref 38.5–50.0)
Hemoglobin: 12.2 g/dL — ABNORMAL LOW (ref 13.2–17.1)
MCH: 31 pg (ref 27.0–33.0)
MCHC: 33.2 g/dL (ref 32.0–36.0)
MCV: 93.4 fL (ref 80.0–100.0)
MPV: 14.1 fL — ABNORMAL HIGH (ref 7.5–12.5)
Platelets: 134 10*3/uL — ABNORMAL LOW (ref 140–400)
RBC: 3.93 10*6/uL — ABNORMAL LOW (ref 4.20–5.80)
RDW: 13.7 % (ref 11.0–15.0)
WBC: 6.4 10*3/uL (ref 3.8–10.8)

## 2018-07-12 LAB — BASIC METABOLIC PANEL WITH GFR
BUN/Creatinine Ratio: 14 (calc) (ref 6–22)
Sodium: 142 mmol/L (ref 135–146)

## 2018-07-14 ENCOUNTER — Encounter: Payer: Medicare Other | Admitting: Rehabilitative and Restorative Service Providers"

## 2018-07-14 DIAGNOSIS — M25512 Pain in left shoulder: Secondary | ICD-10-CM | POA: Diagnosis not present

## 2018-07-14 DIAGNOSIS — M25612 Stiffness of left shoulder, not elsewhere classified: Secondary | ICD-10-CM | POA: Diagnosis not present

## 2018-07-14 DIAGNOSIS — M6281 Muscle weakness (generalized): Secondary | ICD-10-CM | POA: Diagnosis not present

## 2018-07-15 ENCOUNTER — Telehealth (INDEPENDENT_AMBULATORY_CARE_PROVIDER_SITE_OTHER): Payer: Medicare Other | Admitting: Sports Medicine

## 2018-07-15 DIAGNOSIS — N289 Disorder of kidney and ureter, unspecified: Secondary | ICD-10-CM

## 2018-07-15 NOTE — Telephone Encounter (Signed)
Adding labs, he probably needs to come in to get them done again.

## 2018-07-15 NOTE — Telephone Encounter (Signed)
-----   Message from Tasia Catchings, [place] sent at 07/15/2018  4:25 PM EDT ----- Spoke with pt and voices understanding. Pt did not have any further questions. Patient is agreeable to get the panel done to see what is going on and wants to see what may be causing the anemia. Plesae advise.

## 2018-07-16 ENCOUNTER — Other Ambulatory Visit: Payer: Self-pay | Admitting: Sports Medicine

## 2018-07-16 ENCOUNTER — Encounter: Payer: Medicare Other | Admitting: Physical Therapy

## 2018-07-16 NOTE — Telephone Encounter (Signed)
Left brief VM for patient that PCP recommends him coming to get labs done. Patient was asked to call back with any questions.

## 2018-07-17 ENCOUNTER — Telehealth: Payer: Self-pay | Admitting: Rehabilitative and Restorative Service Providers"

## 2018-07-17 LAB — CBC
HCT: 40.1 % (ref 38.5–50.0)
Hemoglobin: 13.4 g/dL (ref 13.2–17.1)
MCH: 30.8 pg (ref 27.0–33.0)
MCHC: 33.4 g/dL (ref 32.0–36.0)
MCV: 92.2 fL (ref 80.0–100.0)
Platelets: 135 10*3/uL — ABNORMAL LOW (ref 140–400)
RBC: 4.35 10*6/uL (ref 4.20–5.80)
RDW: 14.1 % (ref 11.0–15.0)
WBC: 6.4 10*3/uL (ref 3.8–10.8)

## 2018-07-17 LAB — IRON,TIBC AND FERRITIN PANEL
%SAT: 25 % (calc) (ref 20–48)
Ferritin: 428 ng/mL — ABNORMAL HIGH (ref 24–380)
Iron: 80 ug/dL (ref 50–180)
TIBC: 320 mcg/dL (calc) (ref 250–425)

## 2018-07-17 LAB — ABN TEST REFUSAL: ABN TEST REFUSED: 7065

## 2018-07-17 LAB — RETICULOCYTES
ABS Retic: 117450 cells/uL — ABNORMAL HIGH (ref 25000–9000)
Retic Ct Pct: 2.7 %

## 2018-07-17 NOTE — Telephone Encounter (Signed)
F/U with patient on HEP. He is going to [facility] for PT. Will call us with anything we can do to help. Celyn P. Helene Kelp PT, MPH 07/17/18 11:19 AM

## 2018-07-18 ENCOUNTER — Ambulatory Visit (INDEPENDENT_AMBULATORY_CARE_PROVIDER_SITE_OTHER): Payer: Medicare Other | Admitting: Sports Medicine

## 2018-07-18 DIAGNOSIS — I2699 Other pulmonary embolism without acute cor pulmonale: Secondary | ICD-10-CM

## 2018-07-18 DIAGNOSIS — I480 Paroxysmal atrial fibrillation: Secondary | ICD-10-CM

## 2018-07-18 LAB — POCT INR: INR: 2 (ref 2.0–3.0)

## 2018-07-18 NOTE — Progress Notes (Signed)
Results reviewed with provider while pt was in office, pt advised to stay on same dose and return in 2 weeks.

## 2018-07-18 NOTE — Assessment & Plan Note (Signed)
INR therapeutic.  Continue 1/2 tablet on Wednesdays and Fridays and 1 tablet on other days. Currently on 7.5 mg tablets. Recheck in 2 weeks in nurse visit.

## 2018-07-21 ENCOUNTER — Encounter: Payer: Medicare Other | Admitting: Physical Therapy

## 2018-07-23 ENCOUNTER — Encounter: Payer: Medicare Other | Admitting: Physical Therapy

## 2018-07-28 ENCOUNTER — Encounter: Payer: Self-pay | Admitting: Physical Therapy

## 2018-07-29 ENCOUNTER — Ambulatory Visit: Payer: Medicare Other

## 2018-07-29 ENCOUNTER — Encounter: Payer: Self-pay | Admitting: Sports Medicine

## 2018-07-29 ENCOUNTER — Other Ambulatory Visit: Payer: Self-pay

## 2018-07-29 ENCOUNTER — Ambulatory Visit (INDEPENDENT_AMBULATORY_CARE_PROVIDER_SITE_OTHER): Payer: Medicare Other | Admitting: Sports Medicine

## 2018-07-29 VITALS — BP 113/67 | HR 71 | Ht 74.0 in | Wt 265.0 lb

## 2018-07-29 DIAGNOSIS — I82532 Chronic embolism and thrombosis of left popliteal vein: Secondary | ICD-10-CM | POA: Diagnosis not present

## 2018-07-29 DIAGNOSIS — M79662 Pain in left lower leg: Secondary | ICD-10-CM | POA: Diagnosis not present

## 2018-07-29 DIAGNOSIS — I2699 Other pulmonary embolism without acute cor pulmonale: Secondary | ICD-10-CM

## 2018-07-29 DIAGNOSIS — M48061 Spinal stenosis, lumbar region without neurogenic claudication: Secondary | ICD-10-CM

## 2018-07-29 DIAGNOSIS — T50905A Adverse effect of unspecified drugs, medicaments and biological substances, initial encounter: Secondary | ICD-10-CM

## 2018-07-29 DIAGNOSIS — I480 Paroxysmal atrial fibrillation: Secondary | ICD-10-CM | POA: Diagnosis not present

## 2018-07-29 DIAGNOSIS — I82409 Acute embolism and thrombosis of unspecified deep veins of unspecified lower extremity: Secondary | ICD-10-CM | POA: Insufficient documentation

## 2018-07-29 LAB — POCT INR: INR: 2.4 (ref 2.0–3.0)

## 2018-07-29 MED ORDER — PREDNISONE 50 MG PO TABS: ORAL_TABLET | ORAL | 0 refills | Status: DC

## 2018-07-29 MED ORDER — AMITRIPTYLINE HCL 50 MG PO TABS: ORAL_TABLET | ORAL | 3 refills | Status: DC

## 2018-07-29 NOTE — Progress Notes (Addendum)
Subjective:    CC: Left leg pain  HPI: For the past week this pleasant 63 year old male has noted a cramp in his left calf, worsening, localized without radiation.  No back pain, no numbness, tingling.  He does have a history of severe multilevel spinal stenosis.  No chest pain, no shortness of breath.  I reviewed the past medical history, family history, social history, surgical history, and allergies today and no changes were needed.  Please see the problem list section below in epic for further details.  Past Medical History: Past Medical History:  Diagnosis Date  . Atrial fibrillation ([place])   . CAD (coronary artery disease)    Stent placed; [place]  . Congestive heart failure (CHF) ([place])   . DVT (deep vein thrombosis) in pregnancy   . High cholesterol   . Hypertension   . OSA (obstructive sleep apnea) 03/21/2017  . Pulmonary embolus [facility])    Past Surgical History: Past Surgical History:  Procedure Laterality Date  . HERNIA REPAIR    . IVC  2014  . lamenectomy    . left orchiectomy     Social History: Social History   Socioeconomic History  . Marital status: Married    Spouse name: Not on file  . Number of children: 5  . Years of education: Not on file  . Highest education level: Not on file  Occupational History  . Not on file  Social Needs  . Financial resource strain: Not on file  . Food insecurity:    Worry: Not on file    Inability: Not on file  . Transportation needs:    Medical: Not on file    Non-medical: Not on file  Tobacco Use  . Smoking status: Former Smoker    Last attempt to quit: 08/10/1999    Years since quitting: 19.0  . Smokeless tobacco: Never Used  Substance and Sexual Activity  . Alcohol use: Yes    Alcohol/week: 0.0 standard drinks    Comment: Rare  . Drug use: No  . Sexual activity: Yes    Partners: Female  Lifestyle  . Physical activity:    Days per week: Not on file    Minutes per session: Not on file  .  Stress: Not on file  Relationships  . Social connections:    Talks on phone: Not on file    Gets together: Not on file    Attends religious service: Not on file    Active member of club or organization: Not on file    Attends meetings of clubs or organizations: Not on file    Relationship status: Not on file  Other Topics Concern  . Not on file  Social History Narrative  . Not on file   Family History: Family History  Problem Relation Age of Onset  . Thyroid cancer Mother   . Depression Mother   . High Cholesterol Father   . High blood pressure Father   . Heart disease Father        Atrial fibrillation  . Stroke Unknown        Grandfather   Allergies: Allergies  Allergen Reactions  . Lipitor [Atorvastatin]     myalgias   Medications: See med rec.  Review of Systems: No fevers, chills, night sweats, weight loss, chest pain, or shortness of breath.   Objective:    General: Well Developed, well nourished, and in no acute distress.  Neuro: Alert and oriented x3, extra-ocular muscles intact,  sensation grossly intact.  HEENT: Normocephalic, atraumatic, pupils equal round reactive to light, neck supple, no masses, no lymphadenopathy, thyroid nonpalpable.  Skin: Warm and dry, no rashes. Cardiac: Regular rate and rhythm, no murmurs rubs or gallops, no lower extremity edema.  Respiratory: Clear to auscultation bilaterally. Not using accessory muscles, speaking in full sentences. Left leg: Minimal tenderness in the calf at the musculotendinous junction.  No visible swelling, negative Homans sign, good pulses and neurovascularly intact distally.  Impression and Recommendations:    Spinal stenosis of lumbar region History of L2-L5 decompression with Dr. Gloriann Loan, recurrent L2-L4 spinal stenosis on recent MRI. Now with left calf pain and cramping, this is likely radicular, adding amitriptyline, prednisone, declines gabapentin. Due to his calf pain we are however going to go ahead and  rule out a DVT. Adding the labs as well to evaluate for electrolyte abnormalities. Return to see me in 2 weeks if needed.  DVT (deep venous thrombosis) (HCC) Interestingly there is a DVT in the left popliteal vein in the back of the knee, unsure of how long it is been there, I still think that the majority of the pain in the calf is coming from his lumbar spine.  I would however like a second opinion from hematology regarding this DVT in the setting of already being on Coumadin.  Hypercalcemia due to a drug Labs look okay except for calcium which is a bit high, we need to recheck this in a week.  Repeat calcium still high, this is likely true elevation of calcium, with normal parathyroid hormone levels, he needs to stop his vitamin D supplementation and we can recheck in a month. If persistently elevated we will look for other causes and likely refer to nephrology.   ___________________________________________ Gwen Her. Dianah Field, M.D., ABFM., CAQSM. Primary Care and Sports Medicine [facility] [facility]  Adjunct Professor of [employer] Medicine

## 2018-07-29 NOTE — Assessment & Plan Note (Signed)
History of L2-L5 decompression with Dr. Gloriann Loan, recurrent L2-L4 spinal stenosis on recent MRI. Now with left calf pain and cramping, this is likely radicular, adding amitriptyline, prednisone, declines gabapentin. Due to his calf pain we are however going to go ahead and rule out a DVT. Adding the labs as well to evaluate for electrolyte abnormalities. Return to see me in 2 weeks if needed.

## 2018-07-29 NOTE — Assessment & Plan Note (Signed)
Interestingly there is a DVT in the left popliteal vein in the back of the knee, unsure of how long it is been there, I still think that the majority of the pain in the calf is coming from his lumbar spine.  I would however like a second opinion from hematology regarding this DVT in the setting of already being on Coumadin.

## 2018-07-29 NOTE — Addendum Note (Signed)
Addended by: Silverio Decamp on: 07/29/2018 01:10 PM   Modules accepted: Orders

## 2018-07-30 ENCOUNTER — Telehealth: Payer: Self-pay

## 2018-07-30 ENCOUNTER — Encounter: Payer: Self-pay | Admitting: Physical Therapy

## 2018-07-30 ENCOUNTER — Telehealth: Payer: Self-pay | Admitting: Sports Medicine

## 2018-07-30 DIAGNOSIS — T50905A Adverse effect of unspecified drugs, medicaments and biological substances, initial encounter: Secondary | ICD-10-CM | POA: Insufficient documentation

## 2018-07-30 LAB — CBC
HCT: 44.1 % (ref 38.5–50.0)
Hemoglobin: 14.8 g/dL (ref 13.2–17.1)
MCH: 31 pg (ref 27.0–33.0)
MCHC: 33.6 g/dL (ref 32.0–36.0)
MCV: 92.5 fL (ref 80.0–100.0)
MPV: 13.7 fL — ABNORMAL HIGH (ref 7.5–12.5)
Platelets: 157 10*3/uL (ref 140–400)
RBC: 4.77 10*6/uL (ref 4.20–5.80)
RDW: 13.8 % (ref 11.0–15.0)
WBC: 5.8 10*3/uL (ref 3.8–10.8)

## 2018-07-30 LAB — BASIC METABOLIC PANEL
BUN/Creatinine Ratio: 18 (calc) (ref 6–22)
BUN: 31 mg/dL — ABNORMAL HIGH (ref 7–25)
CO2: 26 mmol/L (ref 20–32)
Calcium: 10.5 mg/dL — ABNORMAL HIGH (ref 8.6–10.3)
Chloride: 108 mmol/L (ref 98–110)
Creat: 1.76 mg/dL — ABNORMAL HIGH (ref 0.70–1.25)
Glucose, Bld: 112 mg/dL — ABNORMAL HIGH (ref 65–99)
Potassium: 4.6 mmol/L (ref 3.5–5.3)
Sodium: 141 mmol/L (ref 135–146)

## 2018-07-30 LAB — MAGNESIUM: Magnesium: 2.1 mg/dL (ref 1.5–2.5)

## 2018-07-30 NOTE — Telephone Encounter (Signed)
Patient advised by Dr Dianah Field in office.

## 2018-07-30 NOTE — Telephone Encounter (Signed)
Jeff Green will try the amitriptyline tonight. It wasn't ready to be picked up yesterday. He would like to know if you think it is ok to use a cold compress on the area.

## 2018-07-30 NOTE — Telephone Encounter (Signed)
The amitriptyline is for the pain.  He needs to be taking that one half tab every night for the first week and then 1 tab every night.  I can add Lyrica, I understand he did not want to use gabapentin.  Let me know about the Lyrica.

## 2018-07-30 NOTE — Assessment & Plan Note (Addendum)
Labs look okay except for calcium which is a bit high, we need to recheck this in a week.  Repeat calcium still high, this is likely true elevation of calcium, with normal parathyroid hormone levels, he needs to stop his vitamin D supplementation and we can recheck in a month. If persistently elevated we will look for other causes and likely refer to nephrology.

## 2018-07-30 NOTE — Telephone Encounter (Signed)
Left VM for Pt to move INR visit to next week.

## 2018-07-30 NOTE — Addendum Note (Signed)
Addended by: Silverio Decamp on: 07/30/2018 09:22 AM   Modules accepted: Orders

## 2018-07-30 NOTE — Telephone Encounter (Signed)
Jeff Green came into the office complaining of leg pain. He states he is unable to sleep due to the pain. He would like something for the pain.

## 2018-07-31 ENCOUNTER — Ambulatory Visit: Payer: Medicare Other

## 2018-08-04 ENCOUNTER — Encounter: Payer: Self-pay | Admitting: Rehabilitative and Restorative Service Providers"

## 2018-08-04 ENCOUNTER — Other Ambulatory Visit: Payer: Self-pay | Admitting: Sports Medicine

## 2018-08-04 DIAGNOSIS — E119 Type 2 diabetes mellitus without complications: Secondary | ICD-10-CM

## 2018-08-04 MED ORDER — GLIPIZIDE 5 MG PO TABS: 5.0000 mg | ORAL_TABLET | Freq: Two times a day (BID) | ORAL | 1 refills | Status: DC

## 2018-08-06 ENCOUNTER — Telehealth: Payer: Self-pay | Admitting: Hematology

## 2018-08-06 ENCOUNTER — Other Ambulatory Visit: Payer: Self-pay

## 2018-08-06 ENCOUNTER — Ambulatory Visit (INDEPENDENT_AMBULATORY_CARE_PROVIDER_SITE_OTHER): Payer: Medicare Other | Admitting: Sports Medicine

## 2018-08-06 ENCOUNTER — Encounter: Payer: Self-pay | Admitting: Physical Therapy

## 2018-08-06 DIAGNOSIS — Z95828 Presence of other vascular implants and grafts: Secondary | ICD-10-CM

## 2018-08-06 DIAGNOSIS — I2699 Other pulmonary embolism without acute cor pulmonale: Secondary | ICD-10-CM

## 2018-08-06 DIAGNOSIS — I480 Paroxysmal atrial fibrillation: Secondary | ICD-10-CM

## 2018-08-06 LAB — POCT INR: INR: 3.2 — AB (ref 2.0–3.0)

## 2018-08-06 MED ORDER — HYDROCODONE-ACETAMINOPHEN 10-325 MG PO TABS: 1.0000 | ORAL_TABLET | Freq: Three times a day (TID) | ORAL | 0 refills | Status: DC | PRN

## 2018-08-06 NOTE — Telephone Encounter (Signed)
lmom for pt to return call to office to sch new pt appt. Mailed appt letter for 4/24 at 1130 am

## 2018-08-06 NOTE — Progress Notes (Signed)
   INR slightly supratherapeutic, but we may let him drift a little higher considering calf DVT. Continue 1/2 tablet on Wednesdays and Fridays and 1 tablet on other days. Currently on 7.5 mg tablets. Return in 2 weeks

## 2018-08-07 ENCOUNTER — Other Ambulatory Visit: Payer: Self-pay | Admitting: Sports Medicine

## 2018-08-07 ENCOUNTER — Other Ambulatory Visit: Payer: Self-pay | Admitting: Hematology

## 2018-08-07 ENCOUNTER — Ambulatory Visit: Payer: Medicare Other | Admitting: Rehabilitative and Restorative Service Providers"

## 2018-08-07 DIAGNOSIS — M19012 Primary osteoarthritis, left shoulder: Secondary | ICD-10-CM

## 2018-08-07 DIAGNOSIS — I82432 Acute embolism and thrombosis of left popliteal vein: Secondary | ICD-10-CM

## 2018-08-07 LAB — PTH, INTACT AND CALCIUM
Calcium: 11.3 mg/dL — ABNORMAL HIGH (ref 8.6–10.3)
PTH: 42 pg/mL (ref 14–64)

## 2018-08-07 NOTE — Progress Notes (Signed)
[place] CONSULT NOTE  Patient Care Team: Silverio Decamp, MD as PCP - General (Sports Medicine) Roche, Christian Mate, PA-C as Consulting Physician (Pain Medicine)  HEME/ONC OVERVIEW: 1. Age-indeterminate LLE DVT -07/2018: doppler for L calf pain showed an age-indeterminate near occlusive DVT within one of the duplicated segments of the left popliteal vein   2. History of bilateral PTE -02/2012: CTA chest bilateral, moderate to large clot burden involving bilateral lungs   3. History of questionable liver lesion  -02/2012: CT AP showed a 49mm hypodense lesion in the medial segment of the L hepatic lobe  -10/2013: CT AP showed a 2.2 x 1.8cm hypervascular lesion in the posterior segment of the R hepatic lobe, possibly hemangioma   TREATMENT REGIMEN:  02/2012 - present: warfarin  PERTINENT NON-HEM/ONC PROBLEMS: 1. Paroxysmal A-fib on warfarin 2. Stage IV CKD (Cr ~1.8-2.0)  ASSESSMENT & PLAN:   Age-indeterminate LLE DVT -I reviewed the patient's records in detail, including PCP clinic notes, lab studies, and imaging results -In summary, patient presented to his PCP for new onset left calf pain/swelling, and Doppler of the left lower extremity showed an age indeterminate near occlusive DVT within one of the duplicated segments of the left popliteal vein.  Patient has been on warfarin for paroxysmal A. Fib and hx of PTE.  -The clinical presentation of new onset left leg cramping suggests an acute thrombotic phenomenon -Review of the patient's INRs showed that he is mostly within the therapeutic range with some subtherapeutic levels -I reviewed with the patient about the plan for care for the LLE DVT -This last episode of blood clot appeared to be unprovoked. We discussed about the pros and cons about testing for thrombophilia disorder. His current anticoagulation therapy will interfere with some the tests and it is not possible to interpret the test results. Taking him  off the anticoagulation therapy to do the tests may precipitate another thrombotic event. I do not see a reason to order additional testing to screen for thrombophilia disorder as it would not change our management. In the absence of major contraindications, the goal of anticoagulation therapy is lifelong.  -We discussed about various options of anticoagulation therapies including warfarin, low molecular weight heparin such as Lovenox or direct oral anticoagulants such as rivaroxaban and apixiban. Some of the risks and benefits discussed including costs involved, the need for monitoring, risks of life-threatening bleeding/hospitalization, reversibility of each agent in the event of bleeding or overdose, safety profile of each drug and taking into account other social issues such as ease of administration of medications, etc. -As warfarin is known to have inconsistent anticoagulation effect due to fluctuating INR's, and that patient had presumed acute DVT while taking warfarin, I would suggest changing the anticoagulation regimen to DOAC's, such as Eliquis (due to underlying renal dysfunction) -However, patient is concerned about cost of Eliquis; I encouraged the patient to call his insurance carrier and will also ask the financial counselor to look into any patient assistance programs -Meanwhile, I recommend increasing the target INR range to between 2.5 and 3.5; as we do not have an INR clinic, patient will continue INR monitoring and warfarin dose adjustment with his PCP  -I recommend the patient to use elastic compression stockings at 20-30 mmHg to reduce risks of chronic thrombophlebitis. -Finally, I reinforced the importance of preventive strategies such as avoiding hormonal supplement, avoiding cigarette smoking, keeping up-to-date with screening programs for early cancer detection, frequent ambulation for long distance travel and aggressive DVT  prophylaxis in all surgical settings. -Should he need any  interruption of the anticoagulation for elective procedures in the future, feel free to contact me regarding peri-operative management.  Intermittent thrombocytopenia -Review of the patient's CBCs showed fluctuating platelet count between 130 and 150k dating back at least 2016 -Clinically, patient denies any symptoms of abnormal bleeding or bruising -I personally reviewed the peripheral blood smear, which showed ___ -Plts 140k today, stable  -Given the patient's obesity, underlying liver disease can cause mild thrombocytopenia -Abdominal ultrasound ordered (see discussion below) -We will monitor it for now  History of PTE -Review of the CTA chest in 2013 showed moderate to large clot burden involving bilateral lungs -As discussed above, the goal of anticoagulation is lifelong in the setting of recurrent VTE's, and hypercoagulable testing would not change the management  History of questionable liver lesion  -CT in 2013 showed a small indeterminate left hepatic lobe lesion (27mm), and repeat CT in 2015 showed a 2.2 x 1.8cm hypervascular lesion in the right hepatic lobe; he has not had any follow-up imaging since then -I have ordered abdominal ultrasound to assess for any hepatic abnormalities  History of lung nodule -Review of the CT chest report from 2015 in [place] showed questionable LLL pleural-based nodule ~2cm and subcentimeter RLL nodule -Patient reports a remote hx of tobacco use (~1.5 ppd x 20 years) but quit several decades ago -Given the patient's remote hx of tobacco use, I recommend the patient to have annual lung cancer screening -He prefers to continue follow-up with his PCP, and I have included his PCP in the note for further management   Orders Placed This Encounter  Procedures  . US Abdomen Complete    Standing Status:   Future    Standing Expiration Date:   08/12/2019    Order Specific Question:   Reason for Exam (SYMPTOM  OR DIAGNOSIS REQUIRED)    Answer:   Abnormal  liver lesion in 2015, possibly hemangioma    Order Specific Question:   Preferred imaging location?    Answer:   [facility]  . CBC with Differential (Cancer Center Only)    Standing Status:   Future    Standing Expiration Date:   09/16/2019  . CMP ([place] only)    Standing Status:   Future    Standing Expiration Date:   09/16/2019  . Save Smear ([facility])    Standing Status:   Future    Standing Expiration Date:   08/12/2019  . Protime-INR    Standing Status:   Future    Standing Expiration Date:   09/16/2019   All questions were answered. The patient knows to call the clinic with any problems, questions or concerns.  Return in 1 month for labs and clinic follow-up.  Tish Men, MD 08/12/2018 1:35 PM   CHIEF COMPLAINTS/PURPOSE OF CONSULTATION:  "I still have some left leg cramping"  HISTORY OF PRESENTING ILLNESS:  Jeff Green 63 y.o. male is here because of age indeterminant left lower extremity DVT involving the popliteal vein.  Patient presented to his PCP for evaluation of a new onset leg cramping in late 06/2018.  He has history of PTE, for which he has been on warfarin since late 2013.  He also has an IVC filter in place.  He reports that he developed new onset left leg cramping in late 07/2018, for which Doppler of the LLE showed an age indeterminant DVT in the popliteal vein.  He has been compliant with  his warfarin and dietary restrictions.  He denies any history of abnormal bleeding or bruising on warfarin.  He has been trying to lose weight with diet and exercise, and has lost 75 pounds over the past year.  He is up-to-date with cancer screening.  He reports that overall since the leg cramping started, he the cramping sensation is overall slowly improving.  He denies any fever, chill, night sweats, chest pain, dyspnea, hemoptysis, nausea, vomiting, or diarrhea.  MEDICAL HISTORY:  Past Medical History:  Diagnosis Date  . Atrial fibrillation ([place])   . CAD  (coronary artery disease)    Stent placed; [place]  . Congestive heart failure (CHF) ([place])   . DVT (deep vein thrombosis) in pregnancy   . High cholesterol   . Hypertension   . OSA (obstructive sleep apnea) 03/21/2017  . Pulmonary embolus ([place])     SURGICAL HISTORY: Past Surgical History:  Procedure Laterality Date  . HERNIA REPAIR    . IVC  2014  . lamenectomy    . left orchiectomy      SOCIAL HISTORY: Social History   Socioeconomic History  . Marital status: Married    Spouse name: Not on file  . Number of children: 5  . Years of education: Not on file  . Highest education level: Not on file  Occupational History  . Not on file  Social Needs  . Financial resource strain: Not on file  . Food insecurity:    Worry: Not on file    Inability: Not on file  . Transportation needs:    Medical: Not on file    Non-medical: Not on file  Tobacco Use  . Smoking status: Former Smoker    Last attempt to quit: 08/10/1999    Years since quitting: 19.0  . Smokeless tobacco: Never Used  Substance and Sexual Activity  . Alcohol use: Yes    Alcohol/week: 0.0 standard drinks    Comment: Rare  . Drug use: No  . Sexual activity: Yes    Partners: Female  Lifestyle  . Physical activity:    Days per week: Not on file    Minutes per session: Not on file  . Stress: Not on file  Relationships  . Social connections:    Talks on phone: Not on file    Gets together: Not on file    Attends religious service: Not on file    Active member of club or organization: Not on file    Attends meetings of clubs or organizations: Not on file    Relationship status: Not on file  . Intimate partner violence:    Fear of current or ex partner: Not on file    Emotionally abused: Not on file    Physically abused: Not on file    Forced sexual activity: Not on file  Other Topics Concern  . Not on file  Social History Narrative  . Not on file    FAMILY HISTORY: Family History   Problem Relation Age of Onset  . Thyroid cancer Mother   . Depression Mother   . High Cholesterol Father   . High blood pressure Father   . Heart disease Father        Atrial fibrillation  . Stroke Other        Grandfather    ALLERGIES:  is allergic to lipitor [atorvastatin].  MEDICATIONS:  Current Outpatient Medications  Medication Sig Dispense Refill  . amitriptyline (ELAVIL) 50 MG tablet One half  tab PO qHS for a week, then one tab PO qHS. 90 tablet 3  . Biotin 5000 MCG CAPS Take by mouth.    . carvedilol (COREG) 25 MG tablet Take 1 tablet (25 mg total) by mouth 2 (two) times daily with a meal. 180 tablet 3  . Cholecalciferol (VITAMIN D3) 5000 UNITS TABS Take by mouth 2 (two) times daily between meals.    . Coenzyme Q10 (COQ-10 PO) Take by mouth.    . diltiazem (CARDIZEM CD) 120 MG 24 hr capsule TAKE ONE CAPSULE BY MOUTH DAILY 90 capsule 0  . fenofibrate 160 MG tablet Take 1 tablet (160 mg total) by mouth daily. 90 tablet 0  . glipiZIDE (GLUCOTROL) 5 MG tablet Take 1 tablet (5 mg total) by mouth 2 (two) times daily before a meal. 180 tablet 1  . GLUCOSAMINE CHONDROITIN COMPLX PO Take by mouth. Pt takes 1500-1288mg  per pt.    Marland Kitchen HYDROcodone-acetaminophen (NORCO) 10-325 MG tablet Take 1 tablet by mouth every 8 (eight) hours as needed. 15 tablet 0  . Multiple Vitamin (MULTIVITAMIN) capsule Take 1 capsule by mouth daily.    Marland Kitchen NEEDLE, DISP, 18 G 18G X 1-1/2" MISC Use as needed for testosterone injections 100 each 11  . predniSONE (DELTASONE) 50 MG tablet One tab PO daily for 5 days. 5 tablet 0  . Probiotic Product (PROBIOTIC DAILY PO) Take by mouth.    . rosuvastatin (CRESTOR) 40 MG tablet TAKE ONE TABLET BY MOUTH DAILY 90 tablet 0  . Syringe/Needle, Disp, (SYRINGE 3CC/22GX1") 22G X 1" 3 ML MISC Use as needed for testosterone injections 100 each 11  . testosterone cypionate (DEPOTESTOSTERONE CYPIONATE) 200 MG/ML injection 54mL IM every other week. 10 mL 3  . topiramate (TOPAMAX) 100 MG  tablet Take 1 tablet (100 mg total) by mouth 2 (two) times daily. 180 tablet 3  . warfarin (COUMADIN) 7.5 MG tablet Take 1 tablet (7.5 mg total) by mouth daily. 90 tablet 1   No current facility-administered medications for this visit.     REVIEW OF SYSTEMS:   Constitutional: ( - ) fevers, ( - )  chills , ( - ) night sweats Eyes: ( - ) blurriness of vision, ( - ) double vision, ( - ) watery eyes Ears, nose, mouth, throat, and face: ( - ) mucositis, ( - ) sore throat Respiratory: ( - ) cough, ( - ) dyspnea, ( - ) wheezes Cardiovascular: ( - ) palpitation, ( - ) chest discomfort, ( - ) lower extremity swelling Gastrointestinal:  ( - ) nausea, ( - ) heartburn, ( - ) change in bowel habits Skin: ( - ) abnormal skin rashes Lymphatics: ( - ) new lymphadenopathy, ( - ) easy bruising Neurological: ( - ) numbness, ( - ) tingling, ( - ) new weaknesses Behavioral/Psych: ( - ) mood change, ( - ) new changes  All other systems were reviewed with the patient and are negative.  PHYSICAL EXAMINATION: ECOG PERFORMANCE STATUS: 1 - Symptomatic but completely ambulatory  Vitals:   08/12/18 1247  BP: 121/74  Pulse: 75  Resp: 20  SpO2: 100%   There were no vitals filed for this visit.  GENERAL: alert, no distress and comfortable  SKIN: skin color, texture, turgor are normal, no rashes or significant lesions EYES: conjunctiva are pink and non-injected, sclera clear OROPHARYNX: no exudate, no erythema; lips, buccal mucosa, and tongue normal  NECK: supple, non-tender LUNGS: clear to auscultation with normal breathing effort HEART: regular rate & rhythm,  no murmurs, no lower extremity edema ABDOMEN: soft, non-tender, non-distended, normal bowel sounds Musculoskeletal: no cyanosis of digits and no clubbing  PSYCH: alert & oriented x 3, fluent speech  LABORATORY DATA:  I have reviewed the data as listed Lab Results  Component Value Date   WBC 9.3 08/12/2018   HGB 15.2 08/12/2018   HCT 47.4  08/12/2018   MCV 97.5 08/12/2018   PLT 140 (L) 08/12/2018   Lab Results  Component Value Date   NA 143 08/12/2018   K 4.5 08/12/2018   CL 107 08/12/2018   CO2 29 08/12/2018    RADIOGRAPHIC STUDIES: I have personally reviewed the radiological images as listed and agreed with the findings in the report. US Venous Img Lower Unilateral Left  Result Date: 07/29/2018 CLINICAL DATA:  Left lower extremity pain the past week. History of previous calf DVT, currently on anticoagulation. Remote history of malignancy. Evaluate for acute or chronic DVT. EXAM: LEFT LOWER EXTREMITY VENOUS DOPPLER ULTRASOUND TECHNIQUE: Gray-scale sonography with graded compression, as well as color Doppler and duplex ultrasound were performed to evaluate the lower extremity deep venous systems from the level of the common femoral vein and including the common femoral, femoral, profunda femoral, popliteal and calf veins including the posterior tibial, peroneal and gastrocnemius veins when visible. The superficial great saphenous vein was also interrogated. Spectral Doppler was utilized to evaluate flow at rest and with distal augmentation maneuvers in the common femoral, femoral and popliteal veins. COMPARISON:  None. FINDINGS: Contralateral Common Femoral Vein: Respiratory phasicity is normal and symmetric with the symptomatic side. No evidence of thrombus. Normal compressibility. Common Femoral Vein: No evidence of thrombus. Normal compressibility, respiratory phasicity and response to augmentation. Saphenofemoral Junction: No evidence of thrombus. Normal compressibility and flow on color Doppler imaging. Profunda Femoral Vein: No evidence of thrombus. Normal compressibility and flow on color Doppler imaging. Femoral Vein: No evidence of thrombus. Normal compressibility, respiratory phasicity and response to augmentation. Popliteal Vein: There is mixed echogenic near occlusive thrombus seen within one of the partially duplicated  segments of the duplicated left popliteal vein (images 11, 30, 31 and 35). Calf Veins: Appear patent where imaged Superficial Great Saphenous Vein: No evidence of thrombus. Normal compressibility. Other Findings:  None. IMPRESSION: Age-indeterminate near occlusive DVT within one the partially duplicated segments of the left popliteal vein. In the absence of prior examinations, an acute on chronic process is not excluded. Electronically Signed   By: Sandi Mariscal M.D.   On: 07/29/2018 11:50    PATHOLOGY: I personally reviewed the patient's peripheral blood smear today.  The red blood cells were of normal morphology.  There was no schistocytosis.  The white blood cells were of normal morphology. There were no peripheral circulating blasts. The platelets were of normal size and I verified that there were no platelet clumping.

## 2018-08-08 DIAGNOSIS — M19012 Primary osteoarthritis, left shoulder: Secondary | ICD-10-CM | POA: Diagnosis not present

## 2018-08-08 DIAGNOSIS — Z79891 Long term (current) use of opiate analgesic: Secondary | ICD-10-CM | POA: Diagnosis not present

## 2018-08-08 DIAGNOSIS — Z79899 Other long term (current) drug therapy: Secondary | ICD-10-CM | POA: Diagnosis not present

## 2018-08-08 DIAGNOSIS — M961 Postlaminectomy syndrome, not elsewhere classified: Secondary | ICD-10-CM | POA: Diagnosis not present

## 2018-08-08 DIAGNOSIS — M25512 Pain in left shoulder: Secondary | ICD-10-CM | POA: Diagnosis not present

## 2018-08-08 DIAGNOSIS — G894 Chronic pain syndrome: Secondary | ICD-10-CM | POA: Diagnosis not present

## 2018-08-08 NOTE — Addendum Note (Signed)
Addended by: Silverio Decamp on: 08/08/2018 02:38 PM   Modules accepted: Orders

## 2018-08-11 ENCOUNTER — Encounter: Payer: Self-pay | Admitting: Physical Therapy

## 2018-08-12 ENCOUNTER — Telehealth: Payer: Self-pay | Admitting: Hematology

## 2018-08-12 ENCOUNTER — Encounter: Payer: Self-pay | Admitting: Hematology

## 2018-08-12 ENCOUNTER — Inpatient Hospital Stay: Payer: Medicare Other | Attending: Hematology

## 2018-08-12 ENCOUNTER — Other Ambulatory Visit: Payer: Self-pay | Admitting: Hematology

## 2018-08-12 ENCOUNTER — Inpatient Hospital Stay (HOSPITAL_BASED_OUTPATIENT_CLINIC_OR_DEPARTMENT_OTHER): Payer: Medicare Other | Admitting: Hematology

## 2018-08-12 ENCOUNTER — Other Ambulatory Visit: Payer: Self-pay

## 2018-08-12 VITALS — BP 121/74 | HR 75 | Resp 20 | Ht 74.0 in

## 2018-08-12 DIAGNOSIS — I10 Essential (primary) hypertension: Secondary | ICD-10-CM | POA: Diagnosis not present

## 2018-08-12 DIAGNOSIS — I82432 Acute embolism and thrombosis of left popliteal vein: Secondary | ICD-10-CM

## 2018-08-12 DIAGNOSIS — Z86718 Personal history of other venous thrombosis and embolism: Secondary | ICD-10-CM | POA: Diagnosis not present

## 2018-08-12 DIAGNOSIS — Z79899 Other long term (current) drug therapy: Secondary | ICD-10-CM | POA: Insufficient documentation

## 2018-08-12 DIAGNOSIS — Z87891 Personal history of nicotine dependence: Secondary | ICD-10-CM | POA: Insufficient documentation

## 2018-08-12 DIAGNOSIS — Z7984 Long term (current) use of oral hypoglycemic drugs: Secondary | ICD-10-CM | POA: Insufficient documentation

## 2018-08-12 DIAGNOSIS — D696 Thrombocytopenia, unspecified: Secondary | ICD-10-CM | POA: Insufficient documentation

## 2018-08-12 DIAGNOSIS — E669 Obesity, unspecified: Secondary | ICD-10-CM | POA: Diagnosis not present

## 2018-08-12 DIAGNOSIS — R918 Other nonspecific abnormal finding of lung field: Secondary | ICD-10-CM | POA: Insufficient documentation

## 2018-08-12 DIAGNOSIS — Z86711 Personal history of pulmonary embolism: Secondary | ICD-10-CM

## 2018-08-12 DIAGNOSIS — Z7901 Long term (current) use of anticoagulants: Secondary | ICD-10-CM

## 2018-08-12 DIAGNOSIS — I480 Paroxysmal atrial fibrillation: Secondary | ICD-10-CM | POA: Diagnosis not present

## 2018-08-12 DIAGNOSIS — K769 Liver disease, unspecified: Secondary | ICD-10-CM | POA: Insufficient documentation

## 2018-08-12 DIAGNOSIS — R911 Solitary pulmonary nodule: Secondary | ICD-10-CM | POA: Insufficient documentation

## 2018-08-12 LAB — CMP (CANCER CENTER ONLY)
ALT: 20 U/L (ref 0–44)
AST: 19 U/L (ref 15–41)
Albumin: 4.2 g/dL (ref 3.5–5.0)
Alkaline Phosphatase: 35 U/L — ABNORMAL LOW (ref 38–126)
Anion gap: 7 (ref 5–15)
BUN: 49 mg/dL — ABNORMAL HIGH (ref 6–20)
CO2: 29 mmol/L (ref 22–32)
Calcium: 10.6 mg/dL — ABNORMAL HIGH (ref 8.9–10.3)
Chloride: 107 mmol/L (ref 98–111)
Creatinine: 2.03 mg/dL — ABNORMAL HIGH (ref 0.61–1.24)
GFR, Est AFR Am: 40 mL/min — ABNORMAL LOW (ref >60)
GFR, Estimated: 35 mL/min — ABNORMAL LOW (ref >60)
Glucose, Bld: 103 mg/dL — ABNORMAL HIGH (ref 70–99)
Potassium: 4.5 mmol/L (ref 3.5–5.1)
Sodium: 143 mmol/L (ref 135–145)
Total Bilirubin: 0.4 mg/dL (ref 0.3–1.2)
Total Protein: 6.2 g/dL — ABNORMAL LOW (ref 6.5–8.1)

## 2018-08-12 LAB — CBC WITH DIFFERENTIAL (CANCER CENTER ONLY)
Abs Immature Granulocytes: 0.02 10*3/uL (ref 0.00–0.07)
Basophils Absolute: 0 10*3/uL (ref 0.0–0.1)
Basophils Relative: 0 %
Eosinophils Absolute: 0.3 10*3/uL (ref 0.0–0.5)
Eosinophils Relative: 4 %
HCT: 47.4 % (ref 39.0–52.0)
Hemoglobin: 15.2 g/dL (ref 13.0–17.0)
Immature Granulocytes: 0 %
Lymphocytes Relative: 18 %
Lymphs Abs: 1.7 10*3/uL (ref 0.7–4.0)
MCH: 31.3 pg (ref 26.0–34.0)
MCHC: 32.1 g/dL (ref 30.0–36.0)
MCV: 97.5 fL (ref 80.0–100.0)
Monocytes Absolute: 0.9 10*3/uL (ref 0.1–1.0)
Monocytes Relative: 10 %
Neutro Abs: 6.4 10*3/uL (ref 1.7–7.7)
Neutrophils Relative %: 68 %
Platelet Count: 140 10*3/uL — ABNORMAL LOW (ref 150–400)
RBC: 4.86 MIL/uL (ref 4.22–5.81)
RDW: 14.6 % (ref 11.5–15.5)
WBC Count: 9.3 10*3/uL (ref 4.0–10.5)
nRBC: 0 % (ref 0.0–0.2)

## 2018-08-12 LAB — PROTIME-INR
INR: 3.6 — ABNORMAL HIGH (ref 0.8–1.2)
Prothrombin Time: 35 seconds — ABNORMAL HIGH (ref 11.4–15.2)

## 2018-08-12 LAB — D-DIMER, QUANTITATIVE (NOT AT ARMC): D-Dimer, Quant: 0.35 ug/mL-FEU (ref 0.00–0.50)

## 2018-08-12 MED ORDER — APIXABAN 5 MG PO TABS: 5.0000 mg | ORAL_TABLET | Freq: Two times a day (BID) | ORAL | 3 refills | Status: DC

## 2018-08-12 NOTE — Telephone Encounter (Signed)
Appointments scheduled letter/calendar mailed per 4/21 los

## 2018-08-13 ENCOUNTER — Encounter: Payer: Self-pay | Admitting: Physical Therapy

## 2018-08-15 ENCOUNTER — Other Ambulatory Visit: Payer: Medicare Other

## 2018-08-15 ENCOUNTER — Inpatient Hospital Stay: Payer: Medicare Other | Admitting: Hematology

## 2018-08-20 ENCOUNTER — Ambulatory Visit (INDEPENDENT_AMBULATORY_CARE_PROVIDER_SITE_OTHER): Payer: Medicare Other | Admitting: Sports Medicine

## 2018-08-20 DIAGNOSIS — L219 Seborrheic dermatitis, unspecified: Secondary | ICD-10-CM | POA: Diagnosis not present

## 2018-08-20 DIAGNOSIS — Z86711 Personal history of pulmonary embolism: Secondary | ICD-10-CM

## 2018-08-20 DIAGNOSIS — I480 Paroxysmal atrial fibrillation: Secondary | ICD-10-CM | POA: Diagnosis not present

## 2018-08-20 DIAGNOSIS — I82432 Acute embolism and thrombosis of left popliteal vein: Secondary | ICD-10-CM

## 2018-08-20 LAB — POCT INR: INR: 4.5 — AB (ref 2.0–3.0)

## 2018-08-20 LAB — PROTIME-INR
INR: 4.9 — ABNORMAL HIGH
Prothrombin Time: 45.6 s — ABNORMAL HIGH (ref 9.0–11.5)

## 2018-08-20 MED ORDER — WARFARIN SODIUM 7.5 MG PO TABS: 7.5000 mg | ORAL_TABLET | Freq: Every day | ORAL | 1 refills | Status: DC

## 2018-08-20 NOTE — Assessment & Plan Note (Addendum)
New INR goal per hematology is 2.5-3.5, with recurrence of DVT in spite of Coumadin at range 2-3. He is supratherapeutic today. Decrease dose, 1/2 tablet on Mondays, Wednesdays, and Fridays and 1 tablet on other days for now, recheck in 2 weeks. He was prescribed Eliquis which he is not taking.

## 2018-08-20 NOTE — Assessment & Plan Note (Signed)
Restart triamcinolone, return as needed.

## 2018-08-20 NOTE — Progress Notes (Addendum)
Subjective:    CC: Skin rash  HPI: For the now this pleasant 63 year old male has had a itchy rash over his forehead, cheeks.  Moderate, persistent, localized without radiation.  He has done a single application of topical triamcinolone without much improvement.  I reviewed the past medical history, family history, social history, surgical history, and allergies today and no changes were needed.  Please see the problem list section below in epic for further details.  Past Medical History: Past Medical History:  Diagnosis Date  . Atrial fibrillation ([place])   . CAD (coronary artery disease)    Stent placed; [place]  . Congestive heart failure (CHF) ([place])   . DVT (deep vein thrombosis) in pregnancy   . High cholesterol   . Hypertension   . OSA (obstructive sleep apnea) 03/21/2017  . Pulmonary embolus [facility])    Past Surgical History: Past Surgical History:  Procedure Laterality Date  . HERNIA REPAIR    . IVC  2014  . lamenectomy    . left orchiectomy     Social History: Social History   Socioeconomic History  . Marital status: Married    Spouse name: Not on file  . Number of children: 5  . Years of education: Not on file  . Highest education level: Not on file  Occupational History  . Not on file  Social Needs  . Financial resource strain: Not on file  . Food insecurity:    Worry: Not on file    Inability: Not on file  . Transportation needs:    Medical: Not on file    Non-medical: Not on file  Tobacco Use  . Smoking status: Former Smoker    Last attempt to quit: 08/10/1999    Years since quitting: 19.0  . Smokeless tobacco: Never Used  Substance and Sexual Activity  . Alcohol use: Yes    Alcohol/week: 0.0 standard drinks    Comment: Rare  . Drug use: No  . Sexual activity: Yes    Partners: Female  Lifestyle  . Physical activity:    Days per week: Not on file    Minutes per session: Not on file  . Stress: Not on file  Relationships  . Social  connections:    Talks on phone: Not on file    Gets together: Not on file    Attends religious service: Not on file    Active member of club or organization: Not on file    Attends meetings of clubs or organizations: Not on file    Relationship status: Not on file  Other Topics Concern  . Not on file  Social History Narrative  . Not on file   Family History: Family History  Problem Relation Age of Onset  . Thyroid cancer Mother   . Depression Mother   . High Cholesterol Father   . High blood pressure Father   . Heart disease Father        Atrial fibrillation  . Stroke Other        Grandfather   Allergies: Allergies  Allergen Reactions  . Lipitor [Atorvastatin]     myalgias   Medications: See med rec.  Review of Systems: No fevers, chills, night sweats, weight loss, chest pain, or shortness of breath.   Objective:    General: Well Developed, well nourished, and in no acute distress.  Neuro: Alert and oriented x3, extra-ocular muscles intact, sensation grossly intact.  HEENT: Normocephalic, atraumatic, pupils equal round reactive to  light, neck supple, no masses, no lymphadenopathy, thyroid nonpalpable.  Skin: Warm and dry, reddish, papular rash over the eyebrows and nasolabial folds consistent with seborrheic dermatitis Cardiac: Regular rate and rhythm, no murmurs rubs or gallops, no lower extremity edema.  Respiratory: Clear to auscultation bilaterally. Not using accessory muscles, speaking in full sentences.  Impression and Recommendations:    Seborrheic dermatitis Restart triamcinolone, return as needed.  DVT (deep venous thrombosis) ([facility]) New INR goal per hematology is 2.5-3.5, with recurrence of DVT in spite of Coumadin at range 2-3. He is supratherapeutic today. Decrease dose, 1/2 tablet on Mondays, Wednesdays, and Fridays and 1 tablet on other days for now, recheck in 2 weeks. He was prescribed Eliquis which he is not taking.    ___________________________________________ Gwen Her. Dianah Field, M.D., ABFM., CAQSM. Primary Care and Sports Medicine [facility]  Adjunct Professor of [employer] [facility] [employer]

## 2018-08-21 ENCOUNTER — Other Ambulatory Visit: Payer: Self-pay | Admitting: Hematology

## 2018-08-21 DIAGNOSIS — I82432 Acute embolism and thrombosis of left popliteal vein: Secondary | ICD-10-CM

## 2018-08-21 MED ORDER — APIXABAN 5 MG PO TABS: 5.0000 mg | ORAL_TABLET | Freq: Two times a day (BID) | ORAL | 3 refills | Status: DC

## 2018-08-26 ENCOUNTER — Ambulatory Visit: Payer: Medicare Other | Admitting: Sports Medicine

## 2018-08-27 ENCOUNTER — Encounter: Payer: Self-pay | Admitting: Rehabilitative and Restorative Service Providers"

## 2018-08-27 ENCOUNTER — Other Ambulatory Visit: Payer: Self-pay

## 2018-08-27 ENCOUNTER — Ambulatory Visit (INDEPENDENT_AMBULATORY_CARE_PROVIDER_SITE_OTHER): Payer: Medicare Other | Admitting: Rehabilitative and Restorative Service Providers"

## 2018-08-27 DIAGNOSIS — R29898 Other symptoms and signs involving the musculoskeletal system: Secondary | ICD-10-CM

## 2018-08-27 DIAGNOSIS — M25512 Pain in left shoulder: Secondary | ICD-10-CM | POA: Diagnosis not present

## 2018-08-27 DIAGNOSIS — M25612 Stiffness of left shoulder, not elsewhere classified: Secondary | ICD-10-CM

## 2018-08-27 DIAGNOSIS — R531 Weakness: Secondary | ICD-10-CM | POA: Diagnosis not present

## 2018-08-27 DIAGNOSIS — G8929 Other chronic pain: Secondary | ICD-10-CM | POA: Diagnosis not present

## 2018-08-27 DIAGNOSIS — R293 Abnormal posture: Secondary | ICD-10-CM | POA: Diagnosis not present

## 2018-08-27 NOTE — Therapy (Signed)
[place], [place], [address] Phone: [phone]   Fax:  [phone]  Physical Therapy Treatment  Patient Details  Name: Jeff Green MRN: [id number] Date of Birth: 1955-10-29 Referring Provider (PT): Dr Dianah Field    Encounter Date: 08/27/2018  PT End of Session - 08/27/18 1005    Visit Number  2    Number of Visits  12    Date for PT Re-Evaluation  10/08/18    PT Start Time  1003    PT Stop Time  1049    PT Time Calculation (min)  46 min    Activity Tolerance  Patient tolerated treatment well       Past Medical History:  Diagnosis Date  . Atrial fibrillation ([place])   . CAD (coronary artery disease)    Stent placed; [place]  . Congestive heart failure (CHF) ([place])   . DVT (deep vein thrombosis) in pregnancy   . High cholesterol   . Hypertension   . OSA (obstructive sleep apnea) 03/21/2017  . Pulmonary embolus [facility])     Past Surgical History:  Procedure Laterality Date  . HERNIA REPAIR    . IVC  2014  . lamenectomy    . left orchiectomy      There were no vitals filed for this visit.  Subjective Assessment - 08/27/18 1006    Subjective  Patient has continued to work on his exercises some at home. He is waiting for shoulder replacement. Has had a DVT Lt LE which is now being treated with medication.     Currently in Pain?  No/denies    Pain Score  0-No pain    Pain Location  Shoulder    Pain Orientation  Left    Pain Onset  More than a month ago    Pain Frequency  Constant    Aggravating Factors   lifting; functional use of the Lt UE; worse at night     Pain Relieving Factors  supporting Lt UE w/some weight through the Lt UE          [facility] PT Assessment - 08/27/18 0001      Assessment   Medical Diagnosis  Lt shoulder pain     Referring Provider (PT)  Dr Dianah Field     Onset Date/Surgical Date  04/23/18   pain for several years    Hand Dominance  Right     Next MD Visit  07/11/2018    Prior Therapy  yes here for LBP       Precautions   Precautions  None      Restrictions   Weight Bearing Restrictions  No      Balance Screen   Has the patient fallen in the past 6 months  No    Has the patient had a decrease in activity level because of a fear of falling?   No    Is the patient reluctant to leave their home because of a fear of falling?   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     Leisure  walking dog 2-3 times/day ~ 30 min       Observation/Other Assessments   Focus on Therapeutic Outcomes (FOTO)   46% limitation       Sensation   Additional Comments  WFL's lt  UE       Posture/Postural Control   Posture Comments  head forward; shoudlers rounded and elevated; head of the humerus anterior in orientation; scapula abducted and rotated along the thoracic wall       AROM   Right Shoulder Extension  61 Degrees    Right Shoulder Flexion  149 Degrees    Right Shoulder ABduction  149 Degrees    Right Shoulder Internal Rotation  30 Degrees    Right Shoulder External Rotation  67 Degrees    Left Shoulder Extension  38 Degrees    Left Shoulder Flexion  130 Degrees    Left Shoulder ABduction  117 Degrees    Left Shoulder Internal Rotation  16 Degrees    Left Shoulder External Rotation  55 Degrees    Cervical Flexion  71    Cervical Extension  47    Cervical - Right Side Bend  50    Cervical - Left Side Bend  52    Cervical - Right Rotation  72    Cervical - Left Rotation  70      Strength   Strength Assessment Site  --   strength assessed in mid ranges - IR/ER at 80 abd    Right/Left Shoulder  --   Rt shoulder 5/5    Left Shoulder Flexion  4/5    Left Shoulder Extension  4/5    Left Shoulder ABduction  4-/5    Left Shoulder Internal Rotation  4/5    Left Shoulder External Rotation  3+/5      Palpation   Palpation  comment  muscular tightness through the Lt shoulder girdle in pecs; anterior deltiod; biceps; upper trap; leveator                    [facility] Adult PT Treatment/Exercise - 08/27/18 0001      Shoulder Exercises: Seated   Row  Both;10 reps    Theraband Level (Shoulder Row)  Level 3 (Green)      Shoulder Exercises: Pulleys   Flexion Limitations  --   10 reps, holding 10 seconds.    ABduction  --   10 reps, holding 10 seconds.      Shoulder Exercises: ROM/Strengthening   Other ROM/Strengthening Exercises  scap retraction with ball on wall (shoulder in flexion) x 30 sec        Shoulder Exercises: Isometric Strengthening   Flexion Limitations  1 rep, cues for form    Extension Limitations  1 rep, cues for form    External Rotation Limitations  1 rep, cues for form    Internal Rotation Limitations  1 rep, cues for form    ABduction Limitations  1 rep, cues for form                  PT Long Term Goals - 08/27/18 1033      PT LONG TERM GOAL #1   Title  I with HEP for shoulder 10/08/2018    Time  12    Period  Weeks    Status  Revised      PT LONG TERM GOAL #2   Title  Increase strength Lt UE by 1/2 to 1 muscle grade 10/08/2018    Time  12    Period  Weeks    Status  Revised      PT LONG TERM GOAL #3   Title  demo improved Lt shoulder motion to allow patient  to  consistently reach to the top kitchen shelf and place 1-2# item on shoulder height shelf without difficulty 10/08/2018    Time  12    Period  Weeks    Status  Revised      PT LONG TERM GOAL #4   Title  Improve posterior shoulder girdle strength and control with patient to demonstrate improved scapular stability with elevation of Lt UE 10/08/2018    Time  12    Period  Weeks    Status  Revised      PT LONG TERM GOAL #5   Title  Improve FOTO to </= 35% limitation 10/08/2018    Time  12    Period  Weeks    Status  Revised            Plan - 08/27/18 1006    Clinical Impression Statement   Patient presents with continued chronic Lt shoulder pathology incouding intermittent pain; limited ROM; decreased strength; muscular tightness to palpation; limited functional activity w/ Lt UE. He will benefit from Physical Therapy to prepare for TSA  - which will maximize functional outcome post surgery.     Personal Factors and Comorbidities  Comorbidity 1    Comorbidities  HtN; orthopedic problems LB and shoulder     Examination-Activity Limitations  Reach Overhead;Caring for Others;Other    Examination-Participation Restrictions  Other    Rehab Potential  Good    PT Frequency  2x / week    PT Duration  6 weeks    PT Treatment/Interventions  Patient/family education;ADLs/Self Care Home Management;Cryotherapy;Electrical Stimulation;Iontophoresis 4mg /ml Dexamethasone;Moist Heat;Ultrasound;Dry needling;Manual techniques;Therapeutic activities;Therapeutic exercise;Neuromuscular re-education;Taping    PT Next Visit Plan  review HEP; progress with strengthening posterior shoulder girdle and Lt shoulder/UE; modalities as indicated     PT Home Exercise Plan  Access Code: [email or web address]     Consulted and Agree with Plan of Care  Patient       Patient will benefit from skilled therapeutic intervention in order to improve the following deficits and impairments:  Postural dysfunction, Improper body mechanics, Pain, Increased fascial restricitons, Increased muscle spasms, Decreased strength, Decreased range of motion, Decreased mobility, Decreased activity tolerance  Visit Diagnosis: Chronic left shoulder pain - Plan: PT plan of care cert/re-cert  Weakness generalized - Plan: PT plan of care cert/re-cert  Abnormal posture - Plan: PT plan of care cert/re-cert  Other symptoms and signs involving the musculoskeletal system - Plan: PT plan of care cert/re-cert  Stiffness of left shoulder, not elsewhere classified - Plan: PT plan of care cert/re-cert     Problem List Patient Active Problem List    Diagnosis Date Noted  . Thrombocytopenia ([place]) 08/12/2018  . Liver lesion 08/12/2018  . Lung nodule 08/12/2018  . Presence of IVC filter 08/06/2018  . Hypercalcemia due to a drug 07/30/2018  . DVT (deep venous thrombosis) ([place]) 07/29/2018  . Renal insufficiency 06/27/2018  . Seborrheic keratosis 05/29/2017  . Insomnia 04/05/2017  . OSA (obstructive sleep apnea) 03/21/2017  . Primary osteoarthritis of left shoulder 11/28/2016  . Controlled type 2 diabetes mellitus without complication, without long-term current use of insulin ([place]) 06/08/2016  . Seborrheic dermatitis 06/08/2016  . Morbid obesity ([place]) 06/08/2016  . Ruptured tympanic membrane, bilateral 01/27/2016  . Annual physical exam 01/25/2016  . Paroxysmal atrial fibrillation ([place]) 06/15/2015  . Polycythemia, secondary 08/26/2014  . Hemochromatosis 08/26/2014  . Multiple pulmonary nodules 08/16/2014  . Testicular cancer ([place]) 08/12/2014  . Hyperlipidemia 08/10/2014  . Hypogonadism in male 08/10/2014  .  Essential hypertension 08/10/2014  . Spinal stenosis of lumbar region 08/10/2014  . History of pulmonary embolism 08/10/2014  . CAD (coronary artery disease), native coronary artery 08/10/2014    Raziya Aveni Nilda Simmer PT, MPH  08/27/2018, 11:06 AM  [facility] [place], [place], [address] Phone: [phone]   Fax:  [phone]  Name: Jeff Green MRN: [id number] Date of Birth: 07/22/1955

## 2018-08-29 ENCOUNTER — Encounter: Payer: Medicare Other | Admitting: Rehabilitative and Restorative Service Providers"

## 2018-09-01 ENCOUNTER — Ambulatory Visit (INDEPENDENT_AMBULATORY_CARE_PROVIDER_SITE_OTHER): Payer: Medicare Other | Admitting: Sports Medicine

## 2018-09-01 ENCOUNTER — Encounter: Payer: Self-pay | Admitting: Physical Therapy

## 2018-09-01 ENCOUNTER — Ambulatory Visit (INDEPENDENT_AMBULATORY_CARE_PROVIDER_SITE_OTHER): Payer: Medicare Other | Admitting: Physical Therapy

## 2018-09-01 ENCOUNTER — Other Ambulatory Visit: Payer: Self-pay

## 2018-09-01 DIAGNOSIS — M25612 Stiffness of left shoulder, not elsewhere classified: Secondary | ICD-10-CM

## 2018-09-01 DIAGNOSIS — M25512 Pain in left shoulder: Secondary | ICD-10-CM

## 2018-09-01 DIAGNOSIS — Z86711 Personal history of pulmonary embolism: Secondary | ICD-10-CM | POA: Diagnosis not present

## 2018-09-01 DIAGNOSIS — R531 Weakness: Secondary | ICD-10-CM

## 2018-09-01 DIAGNOSIS — R293 Abnormal posture: Secondary | ICD-10-CM

## 2018-09-01 DIAGNOSIS — G8929 Other chronic pain: Secondary | ICD-10-CM

## 2018-09-01 DIAGNOSIS — I480 Paroxysmal atrial fibrillation: Secondary | ICD-10-CM | POA: Diagnosis not present

## 2018-09-01 LAB — POCT INR: INR: 2.1 (ref 2.0–3.0)

## 2018-09-01 NOTE — Therapy (Signed)
[place], [place], [address] Phone: [phone]   Fax:  [phone]  Physical Therapy Treatment  Patient Details  Name: Jeff Green MRN: [id number] Date of Birth: 1956/02/21 Referring Provider (PT): Dr Dianah Field    Encounter Date: 09/01/2018  PT End of Session - 09/01/18 0812    Visit Number  3    Number of Visits  12    Date for PT Re-Evaluation  10/08/18    PT Start Time  0804    PT Stop Time  0843    PT Time Calculation (min)  39 min    Activity Tolerance  Patient tolerated treatment well    Behavior During Therapy  [facility] for tasks assessed/performed       Past Medical History:  Diagnosis Date  . Atrial fibrillation ([place])   . CAD (coronary artery disease)    Stent placed; [place]  . Congestive heart failure (CHF) ([place])   . DVT (deep vein thrombosis) in pregnancy   . High cholesterol   . Hypertension   . OSA (obstructive sleep apnea) 03/21/2017  . Pulmonary embolus [facility])     Past Surgical History:  Procedure Laterality Date  . HERNIA REPAIR    . IVC  2014  . lamenectomy    . left orchiectomy      There were no vitals filed for this visit.  Subjective Assessment - 09/01/18 0808    Subjective  Pt reports his leg is bothering him, (due to blood clot). He requests to sit for exercises.   "It's just certain things that I do (to my shoulder) that irritate it".      Patient Stated Goals  to increase strength in the lt shoulder before surgery     Currently in Pain?  No/denies         [facility] PT Assessment - 09/01/18 0001      Assessment   Medical Diagnosis  Lt shoulder pain     Referring Provider (PT)  Dr Dianah Field     Onset Date/Surgical Date  04/23/18   pain for several years    Hand Dominance  Right    Next MD Visit  not scheduled yet.     Prior Therapy  yes here for LBP       AROM   Left Shoulder Flexion  130 Degrees    Left Shoulder ABduction   121 Degrees       [facility] Adult PT Treatment/Exercise - 09/01/18 0001      Shoulder Exercises: Seated   Extension  Both;10 reps;Theraband    Theraband Level (Shoulder Extension)  Level 2 (Red);Level 3 (Green)    Row  Both;15 reps    Theraband Level (Shoulder Row)  Level 3 (Green)      Shoulder Exercises: Standing   Internal Rotation  AAROM;Both;10 reps   cane   ABduction  Left;AROM;5 reps   cues for scap positioning   Other Standing Exercises  reverse wall push up holding for 3 sec x 10       Shoulder Exercises: Pulleys   Flexion Limitations  --   10 reps, holding 10 seconds.    ABduction  --   10 reps, holding 10 seconds.      Shoulder Exercises: Therapy Ball   Other Therapy Ball Exercises  seated scap depression with hand on ball x 5 sec x 10 reps, then scap depression with perterbations into ball x 20 sec x 3 reps  Shoulder Exercises: ROM/Strengthening   UBE (Upper Arm Bike)  L1: 1.5 min each direction, seated       Shoulder Exercises: Stretch   Internal Rotation Stretch  5 reps   10 sec hold     Modalities   Modalities  --   pt declined            PT Education - 09/01/18 0854    Education Details  Issued green band.  Added shoulder ext with band (declined hand out)     Person(s) Educated  Patient    Methods  Explanation;Demonstration    Comprehension  Verbalized understanding;Returned demonstration          PT Long Term Goals - 08/27/18 1033      PT LONG TERM GOAL #1   Title  I with HEP for shoulder 10/08/2018    Time  12    Period  Weeks    Status  Revised      PT LONG TERM GOAL #2   Title  Increase strength Lt UE by 1/2 to 1 muscle grade 10/08/2018    Time  12    Period  Weeks    Status  Revised      PT LONG TERM GOAL #3   Title  demo improved Lt shoulder motion to allow patient  to consistently reach to the top kitchen shelf and place 1-2# item on shoulder height shelf without difficulty 10/08/2018    Time  12    Period  Weeks     Status  Revised      PT LONG TERM GOAL #4   Title  Improve posterior shoulder girdle strength and control with patient to demonstrate improved scapular stability with elevation of Lt UE 10/08/2018    Time  12    Period  Weeks    Status  Revised      PT LONG TERM GOAL #5   Title  Improve FOTO to </= 35% limitation 10/08/2018    Time  12    Period  Weeks    Status  Revised            Plan - 09/01/18 9326    Clinical Impression Statement  Pt reporting some mild increase in stiffness upon arrival; improved with UBE and pulley exercise.  Pt had limited tolerance for Lt shoulder IR stretch.  Lt shoulder abdct AROM slightly improved from last session.  Pt required freq cues for avoiding scap elevation during exercise. Many exercises performed in seated position due to LE discomfort.  Pt progressing towards goals.     PT Frequency  2x / week    PT Duration  6 weeks    PT Treatment/Interventions  Patient/family education;ADLs/Self Care Home Management;Cryotherapy;Electrical Stimulation;Iontophoresis 4mg /ml Dexamethasone;Moist Heat;Ultrasound;Dry needling;Manual techniques;Therapeutic activities;Therapeutic exercise;Neuromuscular re-education;Taping    PT Next Visit Plan  progress with strengthening posterior shoulder girdle and Lt shoulder/UE; modalities as indicated     PT Home Exercise Plan  Access Code: [email or web address].      Consulted and Agree with Plan of Care  Patient       Patient will benefit from skilled therapeutic intervention in order to improve the following deficits and impairments:  Postural dysfunction, Improper body mechanics, Pain, Increased fascial restricitons, Increased muscle spasms, Decreased strength, Decreased range of motion, Decreased mobility, Decreased activity tolerance  Visit Diagnosis: Chronic left shoulder pain  Weakness generalized  Abnormal posture  Stiffness of left shoulder, not elsewhere classified     Problem List Patient Active Problem  List    Diagnosis Date Noted  . Thrombocytopenia ([place]) 08/12/2018  . Liver lesion 08/12/2018  . Lung nodule 08/12/2018  . Presence of IVC filter 08/06/2018  . Hypercalcemia due to a drug 07/30/2018  . DVT (deep venous thrombosis) ([place]) 07/29/2018  . Renal insufficiency 06/27/2018  . Seborrheic keratosis 05/29/2017  . Insomnia 04/05/2017  . OSA (obstructive sleep apnea) 03/21/2017  . Primary osteoarthritis of left shoulder 11/28/2016  . Controlled type 2 diabetes mellitus without complication, without long-term current use of insulin ([place]) 06/08/2016  . Seborrheic dermatitis 06/08/2016  . Morbid obesity ([place]) 06/08/2016  . Ruptured tympanic membrane, bilateral 01/27/2016  . Annual physical exam 01/25/2016  . Paroxysmal atrial fibrillation ([place]) 06/15/2015  . Polycythemia, secondary 08/26/2014  . Hemochromatosis 08/26/2014  . Multiple pulmonary nodules 08/16/2014  . Testicular cancer ([place]) 08/12/2014  . Hyperlipidemia 08/10/2014  . Hypogonadism in male 08/10/2014  . Essential hypertension 08/10/2014  . Spinal stenosis of lumbar region 08/10/2014  . History of pulmonary embolism 08/10/2014  . CAD (coronary artery disease), native coronary artery 08/10/2014   Kerin Perna, PTA 09/01/18 8:57 AM  [facility] [place], [place], [address] Phone: [phone]   Fax:  [phone]  Name: Jeff Green MRN: [id number] Date of Birth: 29-Nov-1955

## 2018-09-01 NOTE — Progress Notes (Signed)
Pt advised to take 1/2 tablet on Wednesday and Friday, and 1 whole tablet every other day. Pt to return in 1 week for recheck.

## 2018-09-03 ENCOUNTER — Other Ambulatory Visit: Payer: Self-pay

## 2018-09-03 ENCOUNTER — Ambulatory Visit (INDEPENDENT_AMBULATORY_CARE_PROVIDER_SITE_OTHER): Payer: Medicare Other | Admitting: Physical Therapy

## 2018-09-03 DIAGNOSIS — G8929 Other chronic pain: Secondary | ICD-10-CM | POA: Diagnosis not present

## 2018-09-03 DIAGNOSIS — R531 Weakness: Secondary | ICD-10-CM

## 2018-09-03 DIAGNOSIS — R293 Abnormal posture: Secondary | ICD-10-CM

## 2018-09-03 DIAGNOSIS — M25512 Pain in left shoulder: Secondary | ICD-10-CM | POA: Diagnosis not present

## 2018-09-03 DIAGNOSIS — M25612 Stiffness of left shoulder, not elsewhere classified: Secondary | ICD-10-CM

## 2018-09-03 NOTE — Therapy (Signed)
[place], [place], [address] Phone: [phone]   Fax:  [phone]  Physical Therapy Treatment  Patient Details  Name: Jeff Green MRN: [id number] Date of Birth: [birth year] Referring Provider (PT): Dr Dianah Field    Encounter Date: 09/03/2018  PT End of Session - 09/03/18 0932    Visit Number  4    Number of Visits  12    Date for PT Re-Evaluation  10/08/18    PT Start Time  0932    PT Stop Time  1020    PT Time Calculation (min)  48 min    Activity Tolerance  Patient tolerated treatment well    Behavior During Therapy  [facility] for tasks assessed/performed       Past Medical History:  Diagnosis Date  . Atrial fibrillation ([place])   . CAD (coronary artery disease)    Stent placed; [place]  . Congestive heart failure (CHF) ([place])   . DVT (deep vein thrombosis) in pregnancy   . High cholesterol   . Hypertension   . OSA (obstructive sleep apnea) 03/21/2017  . Pulmonary embolus [facility])     Past Surgical History:  Procedure Laterality Date  . HERNIA REPAIR    . IVC  2014  . lamenectomy    . left orchiectomy      There were no vitals filed for this visit.  Subjective Assessment - 09/03/18 0940    Subjective  Pt reports his Lt shoulder feels pretty good, unless he tries to put arm behind back - then pain increases to 8-9/10.      Patient Stated Goals  to increase strength in the lt shoulder before surgery     Currently in Pain?  No/denies    Pain Score  0-No pain    Pain Location  Shoulder    Pain Orientation  Left         [facility] PT Assessment - 09/03/18 0001      Assessment   Medical Diagnosis  Lt shoulder pain     Referring Provider (PT)  Dr Dianah Field     Onset Date/Surgical Date  04/23/18   pain for several years    Hand Dominance  Right    Next MD Visit  not scheduled yet.     Prior Therapy  yes here for LBP       Strength   Left Shoulder Internal  Rotation  4/5    Left Shoulder External Rotation  4/5       [facility] Adult PT Treatment/Exercise - 09/03/18 0001      Shoulder Exercises: Seated   Extension  Both;10 reps;Theraband    Theraband Level (Shoulder Extension)  Level 3 (Green)    Row  Both;15 reps    Theraband Level (Shoulder Row)  Level 3 (Green)    Theraband Level (Shoulder External Rotation)  Level 2 (Red)    External Rotation Limitations  with Lt shoulder in neutral, elbow at 90 deg, rolling out on stool away from band, and back in. (challenging)x 5 slow reps ;  ball against back of hand, LUE elbow at side, shoulder ER, circles CW/ CCW x 3 reps     Flexion  Strengthening;Left;15 reps    Flexion Weight (lbs)  1    Abduction  Left;Strengthening;10 reps   scaption; mirror for feedback   ABduction Weight (lbs)  1    Other Seated Exercises  scap depression with green band in Lt hand x  10       Shoulder Exercises: Standing   Shoulder Flexion Weight (lbs)  3    Flexion Limitations  overhead lift of weighted box to top of cabinet and down to counter x 6 reps     Other Standing Exercises  reverse wall push up holding for 3 sec x 10       Shoulder Exercises: Pulleys   Flexion Limitations  --   10 reps, holding 10 seconds.    ABduction  --   10 reps, holding 10 seconds.      Shoulder Exercises: ROM/Strengthening   UBE (Upper Arm Bike)  L1: 1.5 min each direction, seated       Shoulder Exercises: Stretch   Internal Rotation Stretch  2 reps   Rt hand assisting Lt hand behind back.      Modalities   Modalities  --   pt declined         PT Long Term Goals - 08/27/18 1033      PT LONG TERM GOAL #1   Title  I with HEP for shoulder 10/08/2018    Time  12    Period  Weeks    Status  Revised      PT LONG TERM GOAL #2   Title  Increase strength Lt UE by 1/2 to 1 muscle grade 10/08/2018    Time  12    Period  Weeks    Status  Revised      PT LONG TERM GOAL #3   Title  demo improved Lt shoulder motion to allow patient   to consistently reach to the top kitchen shelf and place 1-2# item on shoulder height shelf without difficulty 10/08/2018    Time  12    Period  Weeks    Status Achieved     PT LONG TERM GOAL #4   Title  Improve posterior shoulder girdle strength and control with patient to demonstrate improved scapular stability with elevation of Lt UE 10/08/2018    Time  12    Period  Weeks    Status  Revised      PT LONG TERM GOAL #5   Title  Improve FOTO to </= 35% limitation 10/08/2018    Time  12    Period  Weeks    Status  Revised            Plan - 09/03/18 1253    Clinical Impression Statement  Improved scapular control with Lt shoulder flexion/ abduction.  Pt required minor cues to avoid hiking scapula.  Pt able to lift 3# box on/off shelf above head without difficulty; has met LTG #3.  Pt tolerated all exercises well, except attempts at IR (hand behind back).  pROgressing towards goals.     Rehab Potential  Good    PT Frequency  2x / week    PT Duration  6 weeks    PT Treatment/Interventions  Patient/family education;ADLs/Self Care Home Management;Cryotherapy;Electrical Stimulation;Iontophoresis 24m/ml Dexamethasone;Moist Heat;Ultrasound;Dry needling;Manual techniques;Therapeutic activities;Therapeutic exercise;Neuromuscular re-education;Taping    PT Next Visit Plan  progress with strengthening posterior shoulder girdle and Lt shoulder/UE; modalities as indicated     PT Home Exercise Plan  Access Code: [email or web address].         Patient will benefit from skilled therapeutic intervention in order to improve the following deficits and impairments:  Postural dysfunction, Improper body mechanics, Pain, Increased fascial restricitons, Increased muscle spasms, Decreased strength, Decreased range of motion, Decreased mobility, Decreased activity tolerance  Visit Diagnosis: Chronic left shoulder pain  Weakness generalized  Abnormal posture  Stiffness of left shoulder, not elsewhere  classified     Problem List Patient Active Problem List   Diagnosis Date Noted  . Thrombocytopenia ([place]) 08/12/2018  . Liver lesion 08/12/2018  . Lung nodule 08/12/2018  . Presence of IVC filter 08/06/2018  . Hypercalcemia due to a drug 07/30/2018  . DVT (deep venous thrombosis) ([place]) 07/29/2018  . Renal insufficiency 06/27/2018  . Seborrheic keratosis 05/29/2017  . Insomnia 04/05/2017  . OSA (obstructive sleep apnea) 03/21/2017  . Primary osteoarthritis of left shoulder 11/28/2016  . Controlled type 2 diabetes mellitus without complication, without long-term current use of insulin ([place]) 06/08/2016  . Seborrheic dermatitis 06/08/2016  . Morbid obesity ([place]) 06/08/2016  . Ruptured tympanic membrane, bilateral 01/27/2016  . Annual physical exam 01/25/2016  . Paroxysmal atrial fibrillation ([place]) 06/15/2015  . Polycythemia, secondary 08/26/2014  . Hemochromatosis 08/26/2014  . Multiple pulmonary nodules 08/16/2014  . Testicular cancer ([place]) 08/12/2014  . Hyperlipidemia 08/10/2014  . Hypogonadism in male 08/10/2014  . Essential hypertension 08/10/2014  . Spinal stenosis of lumbar region 08/10/2014  . History of pulmonary embolism 08/10/2014  . CAD (coronary artery disease), native coronary artery 08/10/2014   Kerin Perna, PTA 09/03/18 12:59 PM  [place], [place], [address] Phone: [phone]   Fax:  [phone]  Name: Jeff Green MRN: [id number] Date of Birth: 1956/02/05

## 2018-09-05 ENCOUNTER — Encounter: Payer: Medicare Other | Admitting: Physical Therapy

## 2018-09-05 ENCOUNTER — Other Ambulatory Visit: Payer: Self-pay | Admitting: Sports Medicine

## 2018-09-05 DIAGNOSIS — M25561 Pain in right knee: Secondary | ICD-10-CM | POA: Diagnosis not present

## 2018-09-05 DIAGNOSIS — E785 Hyperlipidemia, unspecified: Secondary | ICD-10-CM

## 2018-09-05 DIAGNOSIS — M961 Postlaminectomy syndrome, not elsewhere classified: Secondary | ICD-10-CM | POA: Diagnosis not present

## 2018-09-05 DIAGNOSIS — M25519 Pain in unspecified shoulder: Secondary | ICD-10-CM | POA: Diagnosis not present

## 2018-09-05 DIAGNOSIS — G894 Chronic pain syndrome: Secondary | ICD-10-CM | POA: Diagnosis not present

## 2018-09-08 ENCOUNTER — Other Ambulatory Visit: Payer: Self-pay

## 2018-09-08 ENCOUNTER — Ambulatory Visit (INDEPENDENT_AMBULATORY_CARE_PROVIDER_SITE_OTHER): Payer: Medicare Other | Admitting: Sports Medicine

## 2018-09-08 ENCOUNTER — Ambulatory Visit (INDEPENDENT_AMBULATORY_CARE_PROVIDER_SITE_OTHER): Payer: Medicare Other | Admitting: Physical Therapy

## 2018-09-08 DIAGNOSIS — I480 Paroxysmal atrial fibrillation: Secondary | ICD-10-CM

## 2018-09-08 DIAGNOSIS — R293 Abnormal posture: Secondary | ICD-10-CM | POA: Diagnosis not present

## 2018-09-08 DIAGNOSIS — M25512 Pain in left shoulder: Secondary | ICD-10-CM

## 2018-09-08 DIAGNOSIS — R531 Weakness: Secondary | ICD-10-CM

## 2018-09-08 DIAGNOSIS — G8929 Other chronic pain: Secondary | ICD-10-CM

## 2018-09-08 DIAGNOSIS — M25612 Stiffness of left shoulder, not elsewhere classified: Secondary | ICD-10-CM | POA: Diagnosis not present

## 2018-09-08 DIAGNOSIS — Z86711 Personal history of pulmonary embolism: Secondary | ICD-10-CM

## 2018-09-08 LAB — POCT INR: INR: 2.3 (ref 2.0–3.0)

## 2018-09-08 NOTE — Progress Notes (Signed)
Left VM for Pt with recommendation.  Callback provided for questions and to schedule.

## 2018-09-08 NOTE — Therapy (Signed)
[place], [place], [address] Phone: [phone]   Fax:  [phone]  Physical Therapy Treatment  Patient Details  Name: Jeff Green MRN: [id number] Date of Birth: Nov 04, 1955 Referring Provider (PT): Dr Dianah Field    Encounter Date: 09/08/2018  PT End of Session - 09/08/18 0858    Visit Number  5    Number of Visits  12    Date for PT Re-Evaluation  10/08/18    PT Start Time  0805    PT Stop Time  0851    PT Time Calculation (min)  46 min    Activity Tolerance  Patient tolerated treatment well       Past Medical History:  Diagnosis Date  . Atrial fibrillation ([place])   . CAD (coronary artery disease)    Stent placed; [place]  . Congestive heart failure (CHF) ([place])   . DVT (deep vein thrombosis) in pregnancy   . High cholesterol   . Hypertension   . OSA (obstructive sleep apnea) 03/21/2017  . Pulmonary embolus [facility])     Past Surgical History:  Procedure Laterality Date  . HERNIA REPAIR    . IVC  2014  . lamenectomy    . left orchiectomy      There were no vitals filed for this visit.  Subjective Assessment - 09/08/18 0808    Subjective  Pt reports he is having a difficult time sleeping at night; if he sleeps on his Rt it bothers his Rt hip and low back and if he sleeps on Lt shoulder, it increases pain in shoulder. He is thinking of postponing the shoulder surgery since he is having less pain now.      Patient Stated Goals  to increase strength in the lt shoulder before surgery     Currently in Pain?  Yes    Pain Score  2     Pain Location  Shoulder    Pain Orientation  Left    Pain Descriptors / Indicators  Dull    Aggravating Factors   sleeping on Lt side, moving hand behind back    Pain Relieving Factors  biofreeze spray         [facility] PT Assessment - 09/08/18 0001      Assessment   Medical Diagnosis  Lt shoulder pain     Referring Provider (PT)  Dr  Dianah Field     Onset Date/Surgical Date  04/23/18   pain for several years    Hand Dominance  Right    Next MD Visit  not scheduled yet.     Prior Therapy  yes here for LBP        [facility] Adult PT Treatment/Exercise - 09/08/18 0001      Self-Care   Self-Care  Other Self-Care Comments    Other Self-Care Comments   educated pt on self massage to Lt shoulder and periscapular muscles with ball; pt returned demo with cues.       Shoulder Exercises: Seated   Extension  15 reps;Both;Theraband    Theraband Level (Shoulder Extension)  Level 3 (Green)    Row  Strengthening;Both;20 reps    Theraband Level (Shoulder Row)  Level 4 (Blue)    External Rotation Limitations  with Lt shoulder in neutral, holding RED band, elbow at 90 deg, rolling out on stool away from band, and back in. (challenging) ;  ball against back of hand, LUE elbow at side, shoulder ER, circles  CW/ CCW x 3 reps     Flexion  Strengthening;Left;15 reps   to 95 deg    Flexion Weight (lbs)  1    Flexion Limitations  unable to tolerate 2#    Abduction  Left;Strengthening;10 reps   scaption; mirror for feedback   ABduction Weight (lbs)  1    ABduction Limitations  unable to tolerate 2#    Other Seated Exercises  scap depression with green band in Lt hand x 10       Shoulder Exercises: Standing   Other Standing Exercises  wall ladder, LUE in flexion x 3 reps      Shoulder Exercises: Pulleys   Flexion Limitations  --   10 reps, holding 10 seconds.    ABduction  --   10 reps, holding 10 seconds.      Shoulder Exercises: ROM/Strengthening   UBE (Upper Arm Bike)  L1: 2 min each direction, seated       Shoulder Exercises: Stretch   Internal Rotation Stretch Limitations  cane behind back, x 10             PT Education - 09/08/18 0916    Education Details  Pt issued blue band (for rowing exercise)    Person(s) Educated  Patient    Methods  Explanation    Comprehension  Verbalized understanding          PT  Long Term Goals - 08/27/18 1033      PT LONG TERM GOAL #1   Title  I with HEP for shoulder 10/08/2018    Time  12    Period  Weeks    Status  Revised      PT LONG TERM GOAL #2   Title  Increase strength Lt UE by 1/2 to 1 muscle grade 10/08/2018    Time  12    Period  Weeks    Status  Revised      PT LONG TERM GOAL #3   Title  demo improved Lt shoulder motion to allow patient  to consistently reach to the top kitchen shelf and place 1-2# item on shoulder height shelf without difficulty 10/08/2018    Time  12    Period  Weeks    Status  Revised      PT LONG TERM GOAL #4   Title  Improve posterior shoulder girdle strength and control with patient to demonstrate improved scapular stability with elevation of Lt UE 10/08/2018    Time  12    Period  Weeks    Status  Revised      PT LONG TERM GOAL #5   Title  Improve FOTO to </= 35% limitation 10/08/2018    Time  12    Period  Weeks    Status  Revised            Plan - 09/08/18 0905    Clinical Impression Statement  Pt had some popping/snapping in Lt shoulder with shoulder scaption to 90 deg with 1# wt; unable to tolerate 2#.  He continues to require cues for good scapula positioning during exercise, however can quickly reposition in correct posture. Pt reported reduction of pain at conclusion of session.     Rehab Potential  Good    PT Frequency  2x / week    PT Duration  6 weeks    PT Treatment/Interventions  Patient/family education;ADLs/Self Care Home Management;Cryotherapy;Electrical Stimulation;Iontophoresis 4mg /ml Dexamethasone;Moist Heat;Ultrasound;Dry needling;Manual techniques;Therapeutic activities;Therapeutic exercise;Neuromuscular re-education;Taping    PT  Next Visit Plan  progress with strengthening posterior shoulder girdle and Lt shoulder/UE; modalities as indicated     PT Home Exercise Plan  Access Code: [email or web address].         Patient will benefit from skilled therapeutic intervention in order to improve the following  deficits and impairments:  Postural dysfunction, Improper body mechanics, Pain, Increased fascial restricitons, Increased muscle spasms, Decreased strength, Decreased range of motion, Decreased mobility, Decreased activity tolerance  Visit Diagnosis: Chronic left shoulder pain  Weakness generalized  Abnormal posture  Stiffness of left shoulder, not elsewhere classified     Problem List Patient Active Problem List   Diagnosis Date Noted  . Thrombocytopenia ([place]) 08/12/2018  . Liver lesion 08/12/2018  . Lung nodule 08/12/2018  . Presence of IVC filter 08/06/2018  . Hypercalcemia due to a drug 07/30/2018  . DVT (deep venous thrombosis) ([employer]) 07/29/2018  . Renal insufficiency 06/27/2018  . Seborrheic keratosis 05/29/2017  . Insomnia 04/05/2017  . OSA (obstructive sleep apnea) 03/21/2017  . Primary osteoarthritis of left shoulder 11/28/2016  . Controlled type 2 diabetes mellitus without complication, without long-term current use of insulin ([place]) 06/08/2016  . Seborrheic dermatitis 06/08/2016  . Morbid obesity ([place]) 06/08/2016  . Ruptured tympanic membrane, bilateral 01/27/2016  . Annual physical exam 01/25/2016  . Paroxysmal atrial fibrillation ([place]) 06/15/2015  . Polycythemia, secondary 08/26/2014  . Hemochromatosis 08/26/2014  . Multiple pulmonary nodules 08/16/2014  . Testicular cancer ([place]) 08/12/2014  . Hyperlipidemia 08/10/2014  . Hypogonadism in male 08/10/2014  . Essential hypertension 08/10/2014  . Spinal stenosis of lumbar region 08/10/2014  . History of pulmonary embolism 08/10/2014  . CAD (coronary artery disease), native coronary artery 08/10/2014   Kerin Perna, PTA 09/08/18 9:17 AM  [facility] [place], [place], [address] Phone: [phone]   Fax:  [phone]  Name: Kerin Cecchi MRN: [id number] Date of Birth: [birth year]

## 2018-09-10 ENCOUNTER — Other Ambulatory Visit: Payer: Self-pay

## 2018-09-10 ENCOUNTER — Ambulatory Visit (INDEPENDENT_AMBULATORY_CARE_PROVIDER_SITE_OTHER): Payer: Medicare Other | Admitting: Physical Therapy

## 2018-09-10 DIAGNOSIS — M25512 Pain in left shoulder: Secondary | ICD-10-CM

## 2018-09-10 DIAGNOSIS — G8929 Other chronic pain: Secondary | ICD-10-CM

## 2018-09-10 DIAGNOSIS — M25612 Stiffness of left shoulder, not elsewhere classified: Secondary | ICD-10-CM

## 2018-09-10 DIAGNOSIS — R293 Abnormal posture: Secondary | ICD-10-CM | POA: Diagnosis not present

## 2018-09-10 DIAGNOSIS — R531 Weakness: Secondary | ICD-10-CM

## 2018-09-10 NOTE — Therapy (Signed)
[place], [place], [address] Phone: [phone]   Fax:  [phone]  Physical Therapy Treatment  Patient Details  Name: Jeff Green MRN: [id number] Date of Birth: Jan 08, 1956 Referring Provider (PT): Dr Dianah Field    Encounter Date: 09/10/2018  PT End of Session - 09/10/18 1008    Visit Number  6    Number of Visits  12    Date for PT Re-Evaluation  10/08/18    PT Start Time  1004    PT Stop Time  1043    PT Time Calculation (min)  39 min    Behavior During Therapy  [facility] for tasks assessed/performed       Past Medical History:  Diagnosis Date  . Atrial fibrillation ([place])   . CAD (coronary artery disease)    Stent placed; [place]  . Congestive heart failure (CHF) ([place])   . DVT (deep vein thrombosis) in pregnancy   . High cholesterol   . Hypertension   . OSA (obstructive sleep apnea) 03/21/2017  . Pulmonary embolus [facility])     Past Surgical History:  Procedure Laterality Date  . HERNIA REPAIR    . IVC  2014  . lamenectomy    . left orchiectomy      There were no vitals filed for this visit.  Subjective Assessment - 09/10/18 1008    Subjective  "My shoulders feel like I've done some work...not pain."     Currently in Pain?  Yes    Pain Score  1     Pain Location  Shoulder    Pain Orientation  Left    Pain Descriptors / Indicators  Sore    Aggravating Factors   sleeping on Lt side; moving hand behind back.     Pain Relieving Factors  biofreeze spray         [facility] PT Assessment - 09/10/18 0001      Assessment   Medical Diagnosis  Lt shoulder pain     Referring Provider (PT)  Dr Dianah Field     Onset Date/Surgical Date  04/23/18   pain for several years    Hand Dominance  Right    Next MD Visit  not scheduled yet.     Prior Therapy  yes here for LBP       AROM   Left Shoulder Flexion  127 Degrees      Strength   Left Shoulder Flexion  3+/5    Left  Shoulder Extension  5/5    Left Shoulder ABduction  3+/5   with pain   Left Shoulder Internal Rotation  4/5    Left Shoulder External Rotation  4/5       [facility] Adult PT Treatment/Exercise - 09/10/18 0001      Elbow Exercises   Elbow Flexion  Strengthening;Left;Right;10 reps;Seated;Bar weights/barbell   4# in each hand     Shoulder Exercises: Seated   Extension  --   verbally reviewed   Theraband Level (Shoulder Extension)  --    Row  --   verbally reviewed   Theraband Level (Shoulder Row)  --    Other Seated Exercises  shoulder rolls x 10 each direction     Other Seated Exercises  Rings on hoop (3 attachments = shoulder flex of 110 deg) x 12 rings, Rt to/from Lt, to work ROM and endurance. 2 sets - slow and controlled movement with occasional cues for posture - 1 rep with  RUE while LUE rested.       Shoulder Exercises: Pulleys   Flexion Limitations  cues not to elevate shoulder   10 reps, holding 10 seconds.    ABduction  --   10 reps, holding 10 seconds.      Shoulder Exercises: ROM/Strengthening   UBE (Upper Arm Bike)  L1: 2 min each direction, seated       Shoulder Exercises: Stretch   Internal Rotation Stretch  5 reps   10 sec; strap over shoulder   Table Stretch - Flexion  3 reps;10 seconds    Other Shoulder Stretches  shoulder ext stretch, holding door frame x 15 sec x 3 reps; midlevel doorway stretch x 15 sec x 2 reps (limited range on LUE)             PT Education - 09/10/18 1044    Education Details  info on pulley     Person(s) Educated  Patient    Methods  Explanation;Handout    Comprehension  Verbalized understanding          PT Long Term Goals - 08/27/18 1033      PT LONG TERM GOAL #1   Title  I with HEP for shoulder 10/08/2018    Time  12    Period  Weeks    Status  Revised      PT LONG TERM GOAL #2   Title  Increase strength Lt UE by 1/2 to 1 muscle grade 10/08/2018    Time  12    Period  Weeks    Status  Revised      PT LONG TERM  GOAL #3   Title  demo improved Lt shoulder motion to allow patient  to consistently reach to the top kitchen shelf and place 1-2# item on shoulder height shelf without difficulty 10/08/2018    Time  12    Period  Weeks    Status  Revised      PT LONG TERM GOAL #4   Title  Improve posterior shoulder girdle strength and control with patient to demonstrate improved scapular stability with elevation of Lt UE 10/08/2018    Time  12    Period  Weeks    Status  Revised      PT LONG TERM GOAL #5   Title  Improve FOTO to </= 35% limitation 10/08/2018    Time  12    Period  Weeks    Status  Revised            Plan - 09/10/18 1027    Clinical Impression Statement  Pt's strength and ROM in Lt shoulder was a little less than last assessment.  Pt attributes this to not getting good sleep last night.  Continued focus on Lt shoulder strengthening with proper scapular alignment.  Pt tolerated all exercises well without increase in pain, just fatigue.  Progressing gradually towards established therapy goals.     Rehab Potential  Good    PT Frequency  2x / week    PT Duration  6 weeks    PT Treatment/Interventions  Patient/family education;ADLs/Self Care Home Management;Cryotherapy;Electrical Stimulation;Iontophoresis 4mg /ml Dexamethasone;Moist Heat;Ultrasound;Dry needling;Manual techniques;Therapeutic activities;Therapeutic exercise;Neuromuscular re-education;Taping    PT Next Visit Plan  progress with strengthening posterior shoulder girdle and Lt shoulder/UE; modalities as indicated     PT Home Exercise Plan  Access Code: [email or web address].      Consulted and Agree with Plan of Care  Patient  Patient will benefit from skilled therapeutic intervention in order to improve the following deficits and impairments:     Visit Diagnosis: Chronic left shoulder pain  Weakness generalized  Abnormal posture  Stiffness of left shoulder, not elsewhere classified     Problem List Patient Active Problem  List   Diagnosis Date Noted  . Thrombocytopenia ([place]) 08/12/2018  . Liver lesion 08/12/2018  . Lung nodule 08/12/2018  . Presence of IVC filter 08/06/2018  . Hypercalcemia due to a drug 07/30/2018  . DVT (deep venous thrombosis) ([place]) 07/29/2018  . Renal insufficiency 06/27/2018  . Seborrheic keratosis 05/29/2017  . Insomnia 04/05/2017  . OSA (obstructive sleep apnea) 03/21/2017  . Primary osteoarthritis of left shoulder 11/28/2016  . Controlled type 2 diabetes mellitus without complication, without long-term current use of insulin ([place]) 06/08/2016  . Seborrheic dermatitis 06/08/2016  . Morbid obesity ([place]) 06/08/2016  . Ruptured tympanic membrane, bilateral 01/27/2016  . Annual physical exam 01/25/2016  . Paroxysmal atrial fibrillation ([place]) 06/15/2015  . Polycythemia, secondary 08/26/2014  . Hemochromatosis 08/26/2014  . Multiple pulmonary nodules 08/16/2014  . Testicular cancer ([place]) 08/12/2014  . Hyperlipidemia 08/10/2014  . Hypogonadism in male 08/10/2014  . Essential hypertension 08/10/2014  . Spinal stenosis of lumbar region 08/10/2014  . History of pulmonary embolism 08/10/2014  . CAD (coronary artery disease), native coronary artery 08/10/2014   Kerin Perna, PTA 09/10/18 10:57 AM  [facility] [place], [place], [address] Phone: [phone]   Fax:  [phone]  Name: Jeff Green MRN: [id number] Date of Birth: 1955/12/13

## 2018-09-11 ENCOUNTER — Inpatient Hospital Stay: Payer: Medicare Other | Admitting: Hematology

## 2018-09-11 ENCOUNTER — Inpatient Hospital Stay: Payer: Medicare Other | Attending: Hematology

## 2018-09-17 ENCOUNTER — Encounter: Payer: Self-pay | Admitting: Physical Therapy

## 2018-09-17 ENCOUNTER — Other Ambulatory Visit: Payer: Self-pay

## 2018-09-17 ENCOUNTER — Ambulatory Visit (INDEPENDENT_AMBULATORY_CARE_PROVIDER_SITE_OTHER): Payer: Medicare Other | Admitting: Physical Therapy

## 2018-09-17 DIAGNOSIS — M25612 Stiffness of left shoulder, not elsewhere classified: Secondary | ICD-10-CM | POA: Diagnosis not present

## 2018-09-17 DIAGNOSIS — R293 Abnormal posture: Secondary | ICD-10-CM | POA: Diagnosis not present

## 2018-09-17 DIAGNOSIS — M25512 Pain in left shoulder: Secondary | ICD-10-CM

## 2018-09-17 DIAGNOSIS — R531 Weakness: Secondary | ICD-10-CM | POA: Diagnosis not present

## 2018-09-17 DIAGNOSIS — G8929 Other chronic pain: Secondary | ICD-10-CM | POA: Diagnosis not present

## 2018-09-17 NOTE — Therapy (Signed)
[place], [place], [address] Phone: [phone]   Fax:  [phone]  Physical Therapy Treatment  Patient Details  Name: Jeff Green MRN: [id number] Date of Birth: [birth year] Referring Provider (PT): Dr Dianah Field    Encounter Date: 09/17/2018  PT End of Session - 09/17/18 1013    Visit Number  7    Number of Visits  12    Date for PT Re-Evaluation  10/08/18    PT Start Time  1008    PT Stop Time  1050    PT Time Calculation (min)  42 min    Activity Tolerance  Patient tolerated treatment well    Behavior During Therapy  [facility] for tasks assessed/performed       Past Medical History:  Diagnosis Date  . Atrial fibrillation ([place])   . CAD (coronary artery disease)    Stent placed; [place]  . Congestive heart failure (CHF) ([place])   . DVT (deep vein thrombosis) in pregnancy   . High cholesterol   . Hypertension   . OSA (obstructive sleep apnea) 03/21/2017  . Pulmonary embolus [facility])     Past Surgical History:  Procedure Laterality Date  . HERNIA REPAIR    . IVC  2014  . lamenectomy    . left orchiectomy      There were no vitals filed for this visit.  Subjective Assessment - 09/17/18 1014    Subjective  Pt reports he lifted some boxes of veggies as well as a case of water, with LUE.  He reports soreness from the increased exercises and use,  but not the pain that he had at beginning of episode.     Pertinent History  HTN; pre-diabetic; multiple lumbar surgeries; arthritis     Patient Stated Goals  to increase strength in the lt shoulder before surgery     Pain Score  3     Pain Location  Shoulder    Pain Orientation  Left    Pain Descriptors / Indicators  Sore    Aggravating Factors   moving hand behind back and sleeping on Lt side.     Pain Relieving Factors  biofreeze spray          [facility] PT Assessment - 09/17/18 0001      Assessment   Medical Diagnosis  Lt  shoulder pain     Referring Provider (PT)  Dr Dianah Field     Onset Date/Surgical Date  04/23/18   pain for several years    Hand Dominance  Right    Next MD Visit  not scheduled yet.     Prior Therapy  yes here for LBP        [facility] Adult PT Treatment/Exercise - 09/17/18 0001      Elbow Exercises   Elbow Flexion  Strengthening;Left;Right;10 reps;Seated;Bar weights/barbell   4# in each hand     Shoulder Exercises: Seated   Flexion  Strengthening;Left   10 hold @ 90 deg with scap retraction, 15 sec holds   Flexion Limitations  7 reps LUE, 4 reps RUE    Abduction  Strengthening;Left;Right;5 reps   15 sec holds at 45 deg, 90 deg with scap retraction;scaption   Other Seated Exercises  shoulder rolls x 10 each direction     Other Seated Exercises  Rings on hoop (3 attachments = shoulder flex of 110 deg) x 12 rings, Rt to/from Lt, to work ROM and endurance. 1.5 sets -  slow and controlled movement with occasional cues for posture - 1 rep with RUE while LUE rested.       Shoulder Exercises: Pulleys   Flexion Limitations  cues not to elevate shoulder   10 reps, holding 10 seconds.    ABduction  --   10 reps, holding 10 seconds.      Shoulder Exercises: ROM/Strengthening   UBE (Upper Arm Bike)  L2: 1.5 min each direction, seated       Shoulder Exercises: Stretch   Table Stretch - Flexion  3 reps;10 seconds    Other Shoulder Stretches  mid level door stretch x 15 sec x 2 reps, shoulder ext stretch holding door frame with elbows straight x 15 sec x 2 reps                    PT Long Term Goals - 08/27/18 1033      PT LONG TERM GOAL #1   Title  I with HEP for shoulder 10/08/2018    Time  12    Period  Weeks    Status  Revised      PT LONG TERM GOAL #2   Title  Increase strength Lt UE by 1/2 to 1 muscle grade 10/08/2018    Time  12    Period  Weeks    Status  Revised      PT LONG TERM GOAL #3   Title  demo improved Lt shoulder motion to allow patient  to consistently  reach to the top kitchen shelf and place 1-2# item on shoulder height shelf without difficulty 10/08/2018    Time  12    Period  Weeks    Status  Revised      PT LONG TERM GOAL #4   Title  Improve posterior shoulder girdle strength and control with patient to demonstrate improved scapular stability with elevation of Lt UE 10/08/2018    Time  12    Period  Weeks    Status  Revised      PT LONG TERM GOAL #5   Title  Improve FOTO to </= 35% limitation 10/08/2018    Time  12    Period  Weeks    Status  Revised            Plan - 09/17/18 1109    Clinical Impression Statement  Pt making good postural corrections throughout session and demonstrating improved scapular positioning/control with movement of LUE.  He tolerated exercises well, reporting decreased discomfort at end of session.  Progressing towards goals.     Rehab Potential  Good    PT Frequency  2x / week    PT Duration  6 weeks    PT Treatment/Interventions  Patient/family education;ADLs/Self Care Home Management;Cryotherapy;Electrical Stimulation;Iontophoresis 4mg /ml Dexamethasone;Moist Heat;Ultrasound;Dry needling;Manual techniques;Therapeutic activities;Therapeutic exercise;Neuromuscular re-education;Taping    PT Next Visit Plan  progress with strengthening posterior shoulder girdle and Lt shoulder/UE; modalities as indicated     PT Home Exercise Plan  Access Code: [email or web address].      Consulted and Agree with Plan of Care  Patient       Patient will benefit from skilled therapeutic intervention in order to improve the following deficits and impairments:  Postural dysfunction, Improper body mechanics, Pain, Increased fascial restricitons, Increased muscle spasms, Decreased strength, Decreased range of motion, Decreased mobility, Decreased activity tolerance  Visit Diagnosis: Chronic left shoulder pain  Weakness generalized  Abnormal posture  Stiffness of left shoulder, not elsewhere classified  Problem  List Patient Active Problem List   Diagnosis Date Noted  . Thrombocytopenia ([place]) 08/12/2018  . Liver lesion 08/12/2018  . Lung nodule 08/12/2018  . Presence of IVC filter 08/06/2018  . Hypercalcemia due to a drug 07/30/2018  . DVT (deep venous thrombosis) ([place]) 07/29/2018  . Renal insufficiency 06/27/2018  . Seborrheic keratosis 05/29/2017  . Insomnia 04/05/2017  . OSA (obstructive sleep apnea) 03/21/2017  . Primary osteoarthritis of left shoulder 11/28/2016  . Controlled type 2 diabetes mellitus without complication, without long-term current use of insulin ([place]) 06/08/2016  . Seborrheic dermatitis 06/08/2016  . Morbid obesity ([place]) 06/08/2016  . Ruptured tympanic membrane, bilateral 01/27/2016  . Annual physical exam 01/25/2016  . Paroxysmal atrial fibrillation ([place]) 06/15/2015  . Polycythemia, secondary 08/26/2014  . Hemochromatosis 08/26/2014  . Multiple pulmonary nodules 08/16/2014  . Testicular cancer ([place]) 08/12/2014  . Hyperlipidemia 08/10/2014  . Hypogonadism in male 08/10/2014  . Essential hypertension 08/10/2014  . Spinal stenosis of lumbar region 08/10/2014  . History of pulmonary embolism 08/10/2014  . CAD (coronary artery disease), native coronary artery 08/10/2014   Kerin Perna, PTA 09/17/18 11:14 AM  [facility] [place], [place], [address] Phone: [phone]   Fax:  [phone]  Name: Jeff Green MRN: [id number] Date of Birth: [birth year]

## 2018-09-19 ENCOUNTER — Encounter: Payer: Medicare Other | Admitting: Physical Therapy

## 2018-09-23 DIAGNOSIS — M9901 Segmental and somatic dysfunction of cervical region: Secondary | ICD-10-CM | POA: Diagnosis not present

## 2018-09-23 DIAGNOSIS — M9905 Segmental and somatic dysfunction of pelvic region: Secondary | ICD-10-CM | POA: Diagnosis not present

## 2018-09-23 DIAGNOSIS — M50322 Other cervical disc degeneration at C5-C6 level: Secondary | ICD-10-CM | POA: Diagnosis not present

## 2018-09-23 DIAGNOSIS — M542 Cervicalgia: Secondary | ICD-10-CM | POA: Diagnosis not present

## 2018-09-23 DIAGNOSIS — M545 Low back pain: Secondary | ICD-10-CM | POA: Diagnosis not present

## 2018-09-23 DIAGNOSIS — M9903 Segmental and somatic dysfunction of lumbar region: Secondary | ICD-10-CM | POA: Diagnosis not present

## 2018-09-23 DIAGNOSIS — M9902 Segmental and somatic dysfunction of thoracic region: Secondary | ICD-10-CM | POA: Diagnosis not present

## 2018-09-23 DIAGNOSIS — M47816 Spondylosis without myelopathy or radiculopathy, lumbar region: Secondary | ICD-10-CM | POA: Diagnosis not present

## 2018-09-24 ENCOUNTER — Ambulatory Visit (INDEPENDENT_AMBULATORY_CARE_PROVIDER_SITE_OTHER): Payer: Medicare Other | Admitting: Rehabilitative and Restorative Service Providers"

## 2018-09-24 ENCOUNTER — Encounter: Payer: Self-pay | Admitting: Rehabilitative and Restorative Service Providers"

## 2018-09-24 ENCOUNTER — Other Ambulatory Visit: Payer: Self-pay

## 2018-09-24 ENCOUNTER — Ambulatory Visit (INDEPENDENT_AMBULATORY_CARE_PROVIDER_SITE_OTHER): Payer: Medicare Other | Admitting: Sports Medicine

## 2018-09-24 DIAGNOSIS — M25512 Pain in left shoulder: Secondary | ICD-10-CM | POA: Diagnosis not present

## 2018-09-24 DIAGNOSIS — R293 Abnormal posture: Secondary | ICD-10-CM

## 2018-09-24 DIAGNOSIS — Z86711 Personal history of pulmonary embolism: Secondary | ICD-10-CM

## 2018-09-24 DIAGNOSIS — G8929 Other chronic pain: Secondary | ICD-10-CM

## 2018-09-24 DIAGNOSIS — I480 Paroxysmal atrial fibrillation: Secondary | ICD-10-CM | POA: Diagnosis not present

## 2018-09-24 DIAGNOSIS — M25612 Stiffness of left shoulder, not elsewhere classified: Secondary | ICD-10-CM

## 2018-09-24 DIAGNOSIS — R531 Weakness: Secondary | ICD-10-CM | POA: Diagnosis not present

## 2018-09-24 LAB — POCT INR: INR: 2.1 (ref 2.0–3.0)

## 2018-09-24 NOTE — Progress Notes (Signed)
Left a message advising of recommendations.  

## 2018-09-24 NOTE — Therapy (Signed)
[place], [place], [address] Phone: [phone]   Fax:  [phone]  Physical Therapy Treatment  Patient Details  Name: Jeff Green MRN: [id number] Date of Birth: Aug 30, 1955 Referring Provider (PT): Dr Dianah Field    Encounter Date: 09/24/2018  PT End of Session - 09/24/18 1002    Visit Number  8    Number of Visits  12    Date for PT Re-Evaluation  10/08/18    PT Start Time  0950    PT Stop Time  1042    PT Time Calculation (min)  52 min    Activity Tolerance  Patient tolerated treatment well       Past Medical History:  Diagnosis Date  . Atrial fibrillation ([place])   . CAD (coronary artery disease)    Stent placed; [place]  . Congestive heart failure (CHF) ([place])   . DVT (deep vein thrombosis) in pregnancy   . High cholesterol   . Hypertension   . OSA (obstructive sleep apnea) 03/21/2017  . Pulmonary embolus [facility])     Past Surgical History:  Procedure Laterality Date  . HERNIA REPAIR    . IVC  2014  . lamenectomy    . left orchiectomy      There were no vitals filed for this visit.  Subjective Assessment - 09/24/18 1003    Subjective  Patient reports that his shoulder is sore. He has been doing a lot of exercises at home.     Currently in Pain?  No/denies    Pain Location  Shoulder    Pain Orientation  Left    Pain Descriptors / Indicators  Sore    Pain Type  Chronic pain                       [facility] Adult PT Treatment/Exercise - 09/24/18 0001      Shoulder Exercises: Seated   Other Seated Exercises  theraband bar for bilat/Rt/Lt UE work with focus on posterior shoulder girdle control -1-2 min 3 reps    Other Seated Exercises  scapular depression on yoga blocks - working on scapular dpression pressing down with elbows 8-10 reps 2 sets       Shoulder Exercises: Standing   Other Standing Exercises  wall push up isometric and eccentric control  x 5 reps LE ~ 2.5 ft from wall       Shoulder Exercises: Pulleys   Flexion Limitations  --   10 reps, holding 10 seconds.    ABduction  --   10 reps, holding 10 seconds.      Shoulder Exercises: Therapy Ball   Other Therapy Ball Exercises  seated scap depression with hand on ball x 5 sec x 10 reps, then scap depression with perterbations into ball x 20 sec x 3 reps       Shoulder Exercises: ROM/Strengthening   UBE (Upper Arm Bike)  L1 4 min - 2 min each fwd/back       Shoulder Exercises: Stretch   Table Stretch - Flexion  3 reps;10 seconds    Other Shoulder Stretches  mid level door stretch x 15 sec x 2 reps, shoulder ext stretch holding door frame with elbows straight x 15 sec x 2 reps        Shoulder Exercises: Body Blade   Other Body Blade Exercises  both arms in front/moving up to chest level toward head height 30-45 sec  x 3 reps; one UE at a time to side and down by side focus on scapular control 30-45 sec x 3 each UE              PT Education - 09/24/18 1034    Education Details  HEP     Person(s) Educated  Patient    Methods  Explanation;Demonstration;Tactile cues;Verbal cues;Handout    Comprehension  Verbalized understanding;Returned demonstration;Verbal cues required;Tactile cues required          PT Long Term Goals - 08/27/18 1033      PT LONG TERM GOAL #1   Title  I with HEP for shoulder 10/08/2018    Time  12    Period  Weeks    Status  Revised      PT LONG TERM GOAL #2   Title  Increase strength Lt UE by 1/2 to 1 muscle grade 10/08/2018    Time  12    Period  Weeks    Status  Revised      PT LONG TERM GOAL #3   Title  demo improved Lt shoulder motion to allow patient  to consistently reach to the top kitchen shelf and place 1-2# item on shoulder height shelf without difficulty 10/08/2018    Time  12    Period  Weeks    Status  Revised      PT LONG TERM GOAL #4   Title  Improve posterior shoulder girdle strength and control with patient to  demonstrate improved scapular stability with elevation of Lt UE 10/08/2018    Time  12    Period  Weeks    Status  Revised      PT LONG TERM GOAL #5   Title  Improve FOTO to </= 35% limitation 10/08/2018    Time  12    Period  Weeks    Status  Revised            Plan - 09/24/18 1003    Clinical Impression Statement  Continues to report functional gains in Lt UE use. Demonstrates improved scapular control; movement patterns. Will benefit from PT to address scapular control and improve posterior shoulder girdle strength.     Rehab Potential  Good    PT Frequency  2x / week    PT Duration  6 weeks    PT Treatment/Interventions  Patient/family education;ADLs/Self Care Home Management;Cryotherapy;Electrical Stimulation;Iontophoresis 4mg /ml Dexamethasone;Moist Heat;Ultrasound;Dry needling;Manual techniques;Therapeutic activities;Therapeutic exercise;Neuromuscular re-education;Taping    PT Next Visit Plan  progress with strengthening posterior shoulder girdle and Lt shoulder/UE; modalities as indicated     PT Home Exercise Plan  Access Code: [email or web address].      Consulted and Agree with Plan of Care  Patient       Patient will benefit from skilled therapeutic intervention in order to improve the following deficits and impairments:  Postural dysfunction, Improper body mechanics, Pain, Increased fascial restricitons, Increased muscle spasms, Decreased strength, Decreased range of motion, Decreased mobility, Decreased activity tolerance  Visit Diagnosis: Chronic left shoulder pain  Weakness generalized  Abnormal posture  Stiffness of left shoulder, not elsewhere classified     Problem List Patient Active Problem List   Diagnosis Date Noted  . Thrombocytopenia ([place]) 08/12/2018  . Liver lesion 08/12/2018  . Lung nodule 08/12/2018  . Presence of IVC filter 08/06/2018  . Hypercalcemia due to a drug 07/30/2018  . DVT (deep venous thrombosis) ([place]) 07/29/2018  . Renal insufficiency  06/27/2018  . Seborrheic keratosis 05/29/2017  .  Insomnia 04/05/2017  . OSA (obstructive sleep apnea) 03/21/2017  . Primary osteoarthritis of left shoulder 11/28/2016  . Controlled type 2 diabetes mellitus without complication, without long-term current use of insulin ([place]) 06/08/2016  . Seborrheic dermatitis 06/08/2016  . Morbid obesity ([place]) 06/08/2016  . Ruptured tympanic membrane, bilateral 01/27/2016  . Annual physical exam 01/25/2016  . Paroxysmal atrial fibrillation ([place]) 06/15/2015  . Polycythemia, secondary 08/26/2014  . Hemochromatosis 08/26/2014  . Multiple pulmonary nodules 08/16/2014  . Testicular cancer ([place]) 08/12/2014  . Hyperlipidemia 08/10/2014  . Hypogonadism in male 08/10/2014  . Essential hypertension 08/10/2014  . Spinal stenosis of lumbar region 08/10/2014  . History of pulmonary embolism 08/10/2014  . CAD (coronary artery disease), native coronary artery 08/10/2014    Chosen Geske Nilda Simmer PT, MPH  09/24/2018, 10:53 AM  [facility] [address] [place], [place], [address] Phone: [phone]   Fax:  [phone]  Name: Issaih Kaus MRN: [id number] Date of Birth: 20-Aug-1955

## 2018-09-24 NOTE — Progress Notes (Signed)
   INR still subtherapeutic. Increase dose, 1/2 tablet on Wednesdays and 1 tablet on other days for now, recheck in 1 week. Currently on 7.5 mg tablets.

## 2018-09-24 NOTE — Patient Instructions (Signed)
Wall Push-Up: Double Arm    Stand _2-3 __ feet from wall with both hands on wall. Lower and hold position.  Repeat __5-10_ times per set.   Wring out wet towel  Press up hands at side (yoga block; 4x4's) Push down with elbows  Body blade   Large red ball press down (pressing down at edge of sofa or bed)

## 2018-09-25 ENCOUNTER — Telehealth: Payer: Self-pay | Admitting: Hematology

## 2018-09-25 NOTE — Telephone Encounter (Signed)
Ed and LMVM for patient per 6/4 staff message

## 2018-09-26 ENCOUNTER — Encounter: Payer: Self-pay | Admitting: Sports Medicine

## 2018-09-26 ENCOUNTER — Ambulatory Visit (INDEPENDENT_AMBULATORY_CARE_PROVIDER_SITE_OTHER): Payer: Medicare Other | Admitting: Sports Medicine

## 2018-09-26 ENCOUNTER — Encounter: Payer: Medicare Other | Admitting: Rehabilitative and Restorative Service Providers"

## 2018-09-26 DIAGNOSIS — M48061 Spinal stenosis, lumbar region without neurogenic claudication: Secondary | ICD-10-CM

## 2018-09-26 MED ORDER — METHYLPREDNISOLONE SODIUM SUCC 125 MG IJ SOLR
125.0000 mg | Freq: Once | INTRAMUSCULAR | Status: AC
  Administered 2018-09-26: 125 mg via INTRAMUSCULAR

## 2018-09-26 MED ORDER — PREDNISONE 50 MG PO TABS: ORAL_TABLET | ORAL | 0 refills | Status: DC

## 2018-09-26 NOTE — Progress Notes (Signed)
Subjective:    CC: Left thigh and buttock, leg pain  HPI: Jeff Green is a pleasant 63 year old male, he has a history of a bilateral L2-L5 lumbar laminectomy with Dr. Gloriann Loan.  More recently he is starting to have an increase in pain, radiating from the buttock, to the anterior thigh, down below the knee.  No bowel or bladder dysfunction, saddle numbness, constitutional symptoms.  He tells me he found a chiropractor and has a great deal of help in the manipulation.  He does want something for pain in the meantime.  I reviewed the past medical history, family history, social history, surgical history, and allergies today and no changes were needed.  Please see the problem list section below in epic for further details.  Past Medical History: Past Medical History:  Diagnosis Date  . Atrial fibrillation ([place])   . CAD (coronary artery disease)    Stent placed; [place]  . Congestive heart failure (CHF) ([place])   . DVT (deep vein thrombosis) in pregnancy   . High cholesterol   . Hypertension   . OSA (obstructive sleep apnea) 03/21/2017  . Pulmonary embolus [facility])    Past Surgical History: Past Surgical History:  Procedure Laterality Date  . HERNIA REPAIR    . IVC  2014  . lamenectomy    . left orchiectomy     Social History: Social History   Socioeconomic History  . Marital status: Married    Spouse name: Not on file  . Number of children: 5  . Years of education: Not on file  . Highest education level: Not on file  Occupational History  . Not on file  Social Needs  . Financial resource strain: Not on file  . Food insecurity:    Worry: Not on file    Inability: Not on file  . Transportation needs:    Medical: Not on file    Non-medical: Not on file  Tobacco Use  . Smoking status: Former Smoker    Last attempt to quit: 08/10/1999    Years since quitting: 19.1  . Smokeless tobacco: Never Used  Substance and Sexual Activity  . Alcohol use: Yes    Alcohol/week: 0.0  standard drinks    Comment: Rare  . Drug use: No  . Sexual activity: Yes    Partners: Female  Lifestyle  . Physical activity:    Days per week: Not on file    Minutes per session: Not on file  . Stress: Not on file  Relationships  . Social connections:    Talks on phone: Not on file    Gets together: Not on file    Attends religious service: Not on file    Active member of club or organization: Not on file    Attends meetings of clubs or organizations: Not on file    Relationship status: Not on file  Other Topics Concern  . Not on file  Social History Narrative  . Not on file   Family History: Family History  Problem Relation Age of Onset  . Thyroid cancer Mother   . Depression Mother   . High Cholesterol Father   . High blood pressure Father   . Heart disease Father        Atrial fibrillation  . Stroke Other        Grandfather   Allergies: Allergies  Allergen Reactions  . Lipitor [Atorvastatin]     myalgias   Medications: See med rec.  Review of Systems:  No fevers, chills, night sweats, weight loss, chest pain, or shortness of breath.   Objective:    General: Well Developed, well nourished, and in no acute distress.  Neuro: Alert and oriented x3, extra-ocular muscles intact, sensation grossly intact.  HEENT: Normocephalic, atraumatic, pupils equal round reactive to light, neck supple, no masses, no lymphadenopathy, thyroid nonpalpable.  Skin: Warm and dry, no rashes. Cardiac: Regular rate and rhythm, no murmurs rubs or gallops, no lower extremity edema.  Respiratory: Clear to auscultation bilaterally. Not using accessory muscles, speaking in full sentences.  Impression and Recommendations:    Spinal stenosis of lumbar region Multilevel central canal stenosis. He is post lower L2-L5 decompression. Adding Solu-Medrol 125 intramuscular, prednisone 50 mg daily. Continue chiropractic manipulation.   Return to see me in 4 weeks.  I spent 25 minutes with this  patient, greater than 50% was face-to-face time counseling regarding the above diagnoses.  ___________________________________________ Gwen Her. Dianah Field, M.D., ABFM., CAQSM. Primary Care and Sports Medicine [facility]  Adjunct Professor of [employer]

## 2018-09-26 NOTE — Assessment & Plan Note (Signed)
Multilevel central canal stenosis. He is post lower L2-L5 decompression. Adding Solu-Medrol 125 intramuscular, prednisone 50 mg daily. Continue chiropractic manipulation.   Return to see me in 4 weeks.

## 2018-09-29 ENCOUNTER — Other Ambulatory Visit: Payer: Self-pay | Admitting: Sports Medicine

## 2018-09-29 DIAGNOSIS — E785 Hyperlipidemia, unspecified: Secondary | ICD-10-CM

## 2018-10-01 ENCOUNTER — Other Ambulatory Visit: Payer: Self-pay | Admitting: Sports Medicine

## 2018-10-01 DIAGNOSIS — E119 Type 2 diabetes mellitus without complications: Secondary | ICD-10-CM

## 2018-10-01 DIAGNOSIS — M50322 Other cervical disc degeneration at C5-C6 level: Secondary | ICD-10-CM | POA: Diagnosis not present

## 2018-10-01 DIAGNOSIS — M545 Low back pain: Secondary | ICD-10-CM | POA: Diagnosis not present

## 2018-10-01 DIAGNOSIS — M9902 Segmental and somatic dysfunction of thoracic region: Secondary | ICD-10-CM | POA: Diagnosis not present

## 2018-10-01 DIAGNOSIS — M47816 Spondylosis without myelopathy or radiculopathy, lumbar region: Secondary | ICD-10-CM | POA: Diagnosis not present

## 2018-10-01 DIAGNOSIS — M9905 Segmental and somatic dysfunction of pelvic region: Secondary | ICD-10-CM | POA: Diagnosis not present

## 2018-10-01 DIAGNOSIS — M9901 Segmental and somatic dysfunction of cervical region: Secondary | ICD-10-CM | POA: Diagnosis not present

## 2018-10-01 DIAGNOSIS — M542 Cervicalgia: Secondary | ICD-10-CM | POA: Diagnosis not present

## 2018-10-01 DIAGNOSIS — M9903 Segmental and somatic dysfunction of lumbar region: Secondary | ICD-10-CM | POA: Diagnosis not present

## 2018-10-01 MED ORDER — GLIPIZIDE 5 MG PO TABS: 5.0000 mg | ORAL_TABLET | Freq: Two times a day (BID) | ORAL | 1 refills | Status: DC

## 2018-10-03 ENCOUNTER — Other Ambulatory Visit: Payer: Self-pay

## 2018-10-03 ENCOUNTER — Ambulatory Visit (INDEPENDENT_AMBULATORY_CARE_PROVIDER_SITE_OTHER): Payer: Medicare Other | Admitting: Physical Therapy

## 2018-10-03 ENCOUNTER — Ambulatory Visit (INDEPENDENT_AMBULATORY_CARE_PROVIDER_SITE_OTHER): Payer: Medicare Other | Admitting: Sports Medicine

## 2018-10-03 ENCOUNTER — Encounter: Payer: Self-pay | Admitting: Physical Therapy

## 2018-10-03 DIAGNOSIS — M25612 Stiffness of left shoulder, not elsewhere classified: Secondary | ICD-10-CM | POA: Diagnosis not present

## 2018-10-03 DIAGNOSIS — G894 Chronic pain syndrome: Secondary | ICD-10-CM | POA: Diagnosis not present

## 2018-10-03 DIAGNOSIS — Z79891 Long term (current) use of opiate analgesic: Secondary | ICD-10-CM | POA: Diagnosis not present

## 2018-10-03 DIAGNOSIS — G8929 Other chronic pain: Secondary | ICD-10-CM | POA: Diagnosis not present

## 2018-10-03 DIAGNOSIS — M25519 Pain in unspecified shoulder: Secondary | ICD-10-CM | POA: Diagnosis not present

## 2018-10-03 DIAGNOSIS — M25512 Pain in left shoulder: Secondary | ICD-10-CM | POA: Diagnosis not present

## 2018-10-03 DIAGNOSIS — R531 Weakness: Secondary | ICD-10-CM | POA: Diagnosis not present

## 2018-10-03 DIAGNOSIS — Z79899 Other long term (current) drug therapy: Secondary | ICD-10-CM | POA: Diagnosis not present

## 2018-10-03 DIAGNOSIS — I480 Paroxysmal atrial fibrillation: Secondary | ICD-10-CM

## 2018-10-03 DIAGNOSIS — M25561 Pain in right knee: Secondary | ICD-10-CM | POA: Diagnosis not present

## 2018-10-03 DIAGNOSIS — R293 Abnormal posture: Secondary | ICD-10-CM | POA: Diagnosis not present

## 2018-10-03 DIAGNOSIS — M961 Postlaminectomy syndrome, not elsewhere classified: Secondary | ICD-10-CM | POA: Diagnosis not present

## 2018-10-03 DIAGNOSIS — Z86711 Personal history of pulmonary embolism: Secondary | ICD-10-CM

## 2018-10-03 LAB — POCT INR: INR: 3.8 — AB (ref 2.0–3.0)

## 2018-10-03 NOTE — Progress Notes (Signed)
INR is supratherapeutic but only slightly. Continue current dose, 1/2 tablet on Wednesdays and 1 tablet on other days for now, recheck in 1 week.  Okay to have some greens once or twice a week.

## 2018-10-03 NOTE — Progress Notes (Signed)
Patient advised of recommendations.  

## 2018-10-03 NOTE — Therapy (Signed)
[place], [place], [address] Phone: [phone]   Fax:  [phone]  Physical Therapy Treatment  Patient Details  Name: Jeff Green MRN: [id number] Date of Birth: 10/03/1955 Referring Provider (PT): Dr Dianah Field    Encounter Date: 10/03/2018  PT End of Session - 10/03/18 1141    Visit Number  9    Number of Visits  12    Date for PT Re-Evaluation  10/08/18    PT Start Time  1138    PT Stop Time  1222    PT Time Calculation (min)  44 min    Activity Tolerance  Patient tolerated treatment well;No increased pain    Behavior During Therapy  WFL for tasks assessed/performed       Past Medical History:  Diagnosis Date  . Atrial fibrillation ([place])   . CAD (coronary artery disease)    Stent placed; [place]  . Congestive heart failure (CHF) ([place])   . DVT (deep vein thrombosis) in pregnancy   . High cholesterol   . Hypertension   . OSA (obstructive sleep apnea) 03/21/2017  . Pulmonary embolus [facility])     Past Surgical History:  Procedure Laterality Date  . HERNIA REPAIR    . IVC  2014  . lamenectomy    . left orchiectomy      There were no vitals filed for this visit.  Subjective Assessment - 10/03/18 1142    Subjective  Pt reports he went to chiropractor 2 days ago and he feels like his shoulder and back are moving better (raises arm above head with smile on face).    Currently in Pain?  Yes    Pain Score  2     Pain Location  Back    Pain Orientation  Lower    Pain Descriptors / Indicators  Aching;Numbness    Pain Radiating Towards  into Lt shin    Aggravating Factors   prolonged standing and walking    Pain Relieving Factors  heating pad         [facility] PT Assessment - 10/03/18 0001      Assessment   Medical Diagnosis  Lt shoulder pain     Referring Provider (PT)  Dr Dianah Field     Onset Date/Surgical Date  04/23/18   pain for several years    Hand  Dominance  Right    Next MD Visit  not scheduled yet.     Prior Therapy  yes here for LBP       [facility] Adult PT Treatment/Exercise - 10/03/18 0001      Shoulder Exercises: Seated   Other Seated Exercises  shoulder rolls, forward and backward x 10;theraband bar for bilat/Rt/Lt UE work with focus on posterior shoulder girdle control -1-2 min 3 reps    Other Seated Exercises  scapular depression on yoga blocks - working on scapular dpression pressing down with elbows 8-10 reps 2 sets       Shoulder Exercises: Standing   Other Standing Exercises  wall push up isometric and eccentric control x 10 reps LE ~ 2.5 ft from wall       Shoulder Exercises: Pulleys   Flexion Limitations  --   10 reps, holding 10 seconds.    ABduction  --   10 reps, holding 10 seconds.      Shoulder Exercises: Therapy Ball   Other Therapy Ball Exercises  seated scap depression with hand on ball x  5 sec x 10 reps, then scap depression with perterbations into ball x 20 sec x 3 reps       Shoulder Exercises: ROM/Strengthening   UBE (Upper Arm Bike)  L1: 4 min - 2 min each fwd/back       Shoulder Exercises: Stretch   Table Stretch - Flexion  3 reps;10 seconds   hands on bck of truck   Other Shoulder Stretches  mid level door stretch x 15 sec x 2 reps, shoulder ext stretch holding door frame with elbows straight x 15 sec x 2 reps                    PT Long Term Goals - 10/03/18 1217      PT LONG TERM GOAL #1   Title  I with HEP for shoulder 10/08/2018    Time  12    Period  Weeks    Status  On-going      PT LONG TERM GOAL #2   Title  Increase strength Lt UE by 1/2 to 1 muscle grade 10/08/2018    Time  12    Period  Weeks    Status  Partially Met      PT LONG TERM GOAL #3   Title  demo improved Lt shoulder motion to allow patient  to consistently reach to the top kitchen shelf and place 1-2# item on shoulder height shelf without difficulty 10/08/2018    Time  12    Period  Weeks    Status   Achieved      PT LONG TERM GOAL #4   Title  Improve posterior shoulder girdle strength and control with patient to demonstrate improved scapular stability with elevation of Lt UE 10/08/2018    Time  12    Period  Weeks    Status  Partially Met      PT LONG TERM GOAL #5   Title  Improve FOTO to </= 35% limitation 10/08/2018    Time  12    Period  Weeks    Status  On-going            Plan - 10/03/18 1150    Clinical Impression Statement  Pt continues to be painfree and with improved Lt shoulder ROM.  Postural strength is improving; requiring less cues for scapular positioning during exercise.  Pt tolerated all exercises well, with min to no increase in pain. Pt has partially met his goals.    Rehab Potential  Good    PT Frequency  2x / week    PT Duration  6 weeks    PT Treatment/Interventions  Patient/family education;ADLs/Self Care Home Management;Cryotherapy;Electrical Stimulation;Iontophoresis 85m/ml Dexamethasone;Moist Heat;Ultrasound;Dry needling;Manual techniques;Therapeutic activities;Therapeutic exercise;Neuromuscular re-education;Taping    PT Next Visit Plan  progress HEP and assess readiness to hold vs d/c. 10th visit progress note.    PT Home Exercise Plan  Access Code: [email or web address].      Consulted and Agree with Plan of Care  Patient       Patient will benefit from skilled therapeutic intervention in order to improve the following deficits and impairments:  Postural dysfunction, Improper body mechanics, Pain, Increased fascial restricitons, Increased muscle spasms, Decreased strength, Decreased range of motion, Decreased mobility, Decreased activity tolerance  Visit Diagnosis: Chronic left shoulder pain  Weakness generalized Abnormal posture - Stiffness of left shoulder, not elsewhere classified     Problem List Patient Active Problem List   Diagnosis Date Noted  . Thrombocytopenia ([place]  08/12/2018  . Liver lesion 08/12/2018  . Lung nodule 08/12/2018  . Presence  of IVC filter 08/06/2018  . Hypercalcemia due to a drug 07/30/2018  . DVT (deep venous thrombosis) ([place]) 07/29/2018  . Renal insufficiency 06/27/2018  . Seborrheic keratosis 05/29/2017  . Insomnia 04/05/2017  . OSA (obstructive sleep apnea) 03/21/2017  . Primary osteoarthritis of left shoulder 11/28/2016  . Controlled type 2 diabetes mellitus without complication, without long-term current use of insulin ([place]) 06/08/2016  . Seborrheic dermatitis 06/08/2016  . Morbid obesity ([place]) 06/08/2016  . Ruptured tympanic membrane, bilateral 01/27/2016  . Annual physical exam 01/25/2016  . Paroxysmal atrial fibrillation ([place]) 06/15/2015  . Polycythemia, secondary 08/26/2014  . Hemochromatosis 08/26/2014  . Multiple pulmonary nodules 08/16/2014  . Testicular cancer ([place]) 08/12/2014  . Hyperlipidemia 08/10/2014  . Hypogonadism in male 08/10/2014  . Essential hypertension 08/10/2014  . Spinal stenosis of lumbar region 08/10/2014  . History of pulmonary embolism 08/10/2014  . CAD (coronary artery disease), native coronary artery 08/10/2014   Kerin Perna, PTA 10/03/18 12:42 PM  [place] [address] [place], [place], [address] Phone: [phone]   Fax:  [phone]  Name: Jeff Green MRN: [id number] Date of Birth: July 30, 1955

## 2018-10-09 ENCOUNTER — Ambulatory Visit (INDEPENDENT_AMBULATORY_CARE_PROVIDER_SITE_OTHER): Payer: Medicare Other | Admitting: Rehabilitative and Restorative Service Providers"

## 2018-10-09 ENCOUNTER — Other Ambulatory Visit: Payer: Self-pay

## 2018-10-09 ENCOUNTER — Encounter: Payer: Self-pay | Admitting: Rehabilitative and Restorative Service Providers"

## 2018-10-09 DIAGNOSIS — R293 Abnormal posture: Secondary | ICD-10-CM

## 2018-10-09 DIAGNOSIS — M25612 Stiffness of left shoulder, not elsewhere classified: Secondary | ICD-10-CM | POA: Diagnosis not present

## 2018-10-09 DIAGNOSIS — G8929 Other chronic pain: Secondary | ICD-10-CM | POA: Diagnosis not present

## 2018-10-09 DIAGNOSIS — M25512 Pain in left shoulder: Secondary | ICD-10-CM | POA: Diagnosis not present

## 2018-10-09 DIAGNOSIS — R531 Weakness: Secondary | ICD-10-CM

## 2018-10-09 DIAGNOSIS — R29898 Other symptoms and signs involving the musculoskeletal system: Secondary | ICD-10-CM | POA: Diagnosis not present

## 2018-10-09 NOTE — Patient Instructions (Signed)
  HOLD SHOULDER BLADES DOWN AND BACK!!    Use pulley to raise Lt arm then lower arm slowly  5-10 reps    Arms out in a T  - hold 20-30 sec and then lower slowly  - 5-10 reps  Thumbs can be up or palms flat toward floor  Can add slow pulses  Can add a 1# weight as this gets easy    Sit with arm resting about 20-30 degrees from body; elbow bent at 90 deg; lift forearm off surface about 1-2 inches - hold 3-5 sec; repeat    Ball on wall on back of hand   low medium and high and in where in between

## 2018-10-09 NOTE — Therapy (Addendum)
[place], [place], [address] Phone: [phone]   Fax:  [phone]  Physical Therapy Treatment  Patient Details  Name: Jeff Green MRN: [id number] Date of Birth: 06/05/1955 Referring Provider (PT): Dr Dianah Field   Progress Note Reporting Period 08/27/2018 to 10/09/2018  See note below for Objective Data and Assessment of Progress/Goals.      Encounter Date: 10/09/2018  PT End of Session - 10/09/18 1107    Visit Number  10    Number of Visits  12    Date for PT Re-Evaluation  10/08/18    PT Start Time  1105    PT Stop Time  1150    PT Time Calculation (min)  45 min    Activity Tolerance  Patient tolerated treatment well       Past Medical History:  Diagnosis Date  . Atrial fibrillation ([place])   . CAD (coronary artery disease)    Stent placed; [place]  . Congestive heart failure (CHF) ([place])   . DVT (deep vein thrombosis) in pregnancy   . High cholesterol   . Hypertension   . OSA (obstructive sleep apnea) 03/21/2017  . Pulmonary embolus [facility])     Past Surgical History:  Procedure Laterality Date  . HERNIA REPAIR    . IVC  2014  . lamenectomy    . left orchiectomy      There were no vitals filed for this visit.  Subjective Assessment - 10/09/18 1107    Subjective  Shoulder is doing well. He has some soreness with activity. Using the Lt UE for more activities including lifting.    Currently in Pain?  No/denies    Pain Location  Shoulder    Pain Orientation  Left    Pain Descriptors / Indicators  Sore         [facility] PT Assessment - 10/09/18 0001      Assessment   Medical Diagnosis  Lt shoulder pain     Referring Provider (PT)  Dr Dianah Field     Onset Date/Surgical Date  04/23/18   pain for several years    Hand Dominance  Right    Next MD Visit  PRN     Prior Therapy  yes here for LBP       AROM   Right Shoulder Extension  61 Degrees    Right  Shoulder Flexion  155 Degrees    Right Shoulder ABduction  155 Degrees    Right Shoulder Internal Rotation  30 Degrees    Right Shoulder External Rotation  70 Degrees    Left Shoulder Extension  40 Degrees    Left Shoulder Flexion  142 Degrees    Left Shoulder ABduction  145 Degrees    Left Shoulder Internal Rotation  20 Degrees    Left Shoulder External Rotation  61 Degrees      Strength   Left Shoulder Flexion  4/5    Left Shoulder Extension  5/5    Left Shoulder ABduction  4/5    Left Shoulder Internal Rotation  4+/5    Left Shoulder External Rotation  4/5      Palpation   Palpation comment  muscular tightness through the Lt shoulder girdle in pecs; anterior deltiod; biceps; upper trap; leveator                    [facility] Adult PT Treatment/Exercise - 10/09/18 0001      Therapeutic  Activites    Therapeutic Activities  --   myofacial ball release work anterior Lt shoulder      Shoulder Exercises: Seated   Other Seated Exercises  UE's 90 deg abd 20 sec hold x 5 reps slow eccentric lowering; seated ER shd 30 abd/elbow 90 deg UE supported on table 10 resp x 2 sets PT holding yellow TB; initiatioin of deltoid UE supported on table 5 sec hold x 10 reps; ball on wall dorsum of hand working on ER in varied positions of shoulder CW/CCW circles x 10-20 reps each position       Shoulder Exercises: Pulleys   Flexion Limitations  --   10 reps, holding 10 seconds.    ABduction  --   10 reps, holding 10 seconds.    Other Pulley Exercises  eccentric lowering Lt UE from elevated postition x 10 reps slow eccentric lowering              PT Education - 10/09/18 1141    Education Details  HEP; discussed progression of exercise for home; POC    Person(s) Educated  Patient    Methods  Explanation;Demonstration;Tactile cues;Verbal cues;Handout    Comprehension  Verbalized understanding;Returned demonstration;Verbal cues required;Tactile cues required;Need further instruction           PT Long Term Goals - 10/09/18 1202      PT LONG TERM GOAL #1   Title  I with HEP for shoulder 11/20/2018    Time  18    Period  Weeks    Status  Revised      PT LONG TERM GOAL #2   Title  Increase strength Lt UE by 1/2 to 1 muscle grade 11/20/2018    Time  18    Period  Weeks    Status  Revised      PT LONG TERM GOAL #3   Title  demo improved Lt shoulder motion to allow patient  to consistently reach to the top kitchen shelf and place 1-2# item on shoulder height shelf without difficulty 10/08/2018    Time  12    Period  Weeks    Status  Achieved      PT LONG TERM GOAL #4   Title  Improve posterior shoulder girdle strength and control with patient to demonstrate improved scapular stability with elevation of Lt UE 10/08/2018    Time  12    Period  Weeks    Status  Achieved      PT LONG TERM GOAL #5   Title  Improve FOTO to </= 35% limitation 10/08/2018    Time  12    Period  Weeks    Status  Achieved            Plan - 10/09/18 1107    Clinical Impression Statement  Excellent progress. Patient has accomplished FOTO goals. He demonstrates increased ROM and strength Lt UE. Rich reports improved functional activity tolerance and Lt UE use with less pain and crepitus. Will benefit from a few additional visits to acheive maximum rehab potential and progress HEP.    Rehab Potential  Good    PT Frequency  2x / week    PT Duration  6 weeks    PT Treatment/Interventions  Patient/family education;ADLs/Self Care Home Management;Cryotherapy;Electrical Stimulation;Iontophoresis 22m/ml Dexamethasone;Moist Heat;Ultrasound;Dry needling;Manual techniques;Therapeutic activities;Therapeutic exercise;Neuromuscular re-education;Taping    PT Next Visit Plan  progress HEP 1-2 additional visits    PT Home Exercise Plan  Access Code: [email or web address].  Consulted and Agree with Plan of Care  Patient       Patient will benefit from skilled therapeutic intervention in order to improve the  following deficits and impairments:  Postural dysfunction, Improper body mechanics, Pain, Increased fascial restricitons, Increased muscle spasms, Decreased strength, Decreased range of motion, Decreased mobility, Decreased activity tolerance  Visit Diagnosis: 1. Chronic left shoulder pain   2. Weakness generalized   3. Abnormal posture   4. Stiffness of left shoulder, not elsewhere classified   5. Other symptoms and signs involving the musculoskeletal system        Problem List Patient Active Problem List   Diagnosis Date Noted  . Thrombocytopenia ([place]) 08/12/2018  . Liver lesion 08/12/2018  . Lung nodule 08/12/2018  . Presence of IVC filter 08/06/2018  . Hypercalcemia due to a drug 07/30/2018  . DVT (deep venous thrombosis) ([place]) 07/29/2018  . Renal insufficiency 06/27/2018  . Seborrheic keratosis 05/29/2017  . Insomnia 04/05/2017  . OSA (obstructive sleep apnea) 03/21/2017  . Primary osteoarthritis of left shoulder 11/28/2016  . Controlled type 2 diabetes mellitus without complication, without long-term current use of insulin ([place]) 06/08/2016  . Seborrheic dermatitis 06/08/2016  . Morbid obesity ([place]) 06/08/2016  . Ruptured tympanic membrane, bilateral 01/27/2016  . Annual physical exam 01/25/2016  . Paroxysmal atrial fibrillation ([place]) 06/15/2015  . Polycythemia, secondary 08/26/2014  . Hemochromatosis 08/26/2014  . Multiple pulmonary nodules 08/16/2014  . Testicular cancer ([place]) 08/12/2014  . Hyperlipidemia 08/10/2014  . Hypogonadism in male 08/10/2014  . Essential hypertension 08/10/2014  . Spinal stenosis of lumbar region 08/10/2014  . History of pulmonary embolism 08/10/2014  . CAD (coronary artery disease), native coronary artery 08/10/2014    Aury Scollard Nilda Simmer PT, MPH  10/09/2018, 12:05 PM  [facility] [place], [place], [address] Phone: [phone]   Fax:  [phone]  Name: Ephrem Carrick MRN: [id number] Date of Birth: Sep 06, 1955  PHYSICAL THERAPY DISCHARGE SUMMARY  Visits from Start of Care: 10  Current functional level related to goals / functional outcomes: See last progress note for discharge status    Remaining deficits: Needs to continue with consistent HEP to maintain strength and function.    Education / Equipment: HEP   Plan: Patient agrees to discharge.  Patient goals were met. Patient is being discharged due to meeting the stated rehab goals.  ?????     Christean Silvestri P. Helene Kelp PT, MPH 11/05/18 12:58 PM

## 2018-10-20 ENCOUNTER — Other Ambulatory Visit: Payer: Self-pay | Admitting: Sports Medicine

## 2018-10-22 ENCOUNTER — Ambulatory Visit: Payer: Medicare Other

## 2018-10-23 ENCOUNTER — Encounter: Payer: Medicare Other | Admitting: Rehabilitative and Restorative Service Providers"

## 2018-11-05 ENCOUNTER — Other Ambulatory Visit: Payer: Self-pay

## 2018-11-05 ENCOUNTER — Encounter: Payer: Self-pay | Admitting: Sports Medicine

## 2018-11-05 ENCOUNTER — Ambulatory Visit (INDEPENDENT_AMBULATORY_CARE_PROVIDER_SITE_OTHER): Payer: Medicare Other | Admitting: Sports Medicine

## 2018-11-05 VITALS — BP 116/77 | HR 81 | Ht 74.0 in | Wt 276.0 lb

## 2018-11-05 DIAGNOSIS — M48061 Spinal stenosis, lumbar region without neurogenic claudication: Secondary | ICD-10-CM

## 2018-11-05 DIAGNOSIS — I480 Paroxysmal atrial fibrillation: Secondary | ICD-10-CM | POA: Diagnosis not present

## 2018-11-05 DIAGNOSIS — Z86711 Personal history of pulmonary embolism: Secondary | ICD-10-CM | POA: Diagnosis not present

## 2018-11-05 DIAGNOSIS — E119 Type 2 diabetes mellitus without complications: Secondary | ICD-10-CM | POA: Diagnosis not present

## 2018-11-05 LAB — POCT GLYCOSYLATED HEMOGLOBIN (HGB A1C): Hemoglobin A1C: 6.1 % — AB (ref 4.0–5.6)

## 2018-11-05 LAB — POCT INR: INR: 3.9 — AB (ref 2.0–3.0)

## 2018-11-05 NOTE — Patient Instructions (Signed)
INR is still supratherapeutic. Decrease dose, 1/2 tablet on Wednesdays and Saturdays and 1 tablet on other days, recheck in 1 week.  Okay to have some greens once or twice a week. Currently on 7.5 mg tablets. Goal 2.5-3.5

## 2018-11-05 NOTE — Assessment & Plan Note (Signed)
History of L2-L5 lumbar decompression. We did Solu-Medrol and prednisone at the last visit with only temporary relief. He got temporary relief from chiropractic manipulation as is typical. Recent MRI postsurgical shows residual stenosis moderate to severe at L3-4. At this point I have advised him to touch base again with his neurosurgeon.

## 2018-11-05 NOTE — Assessment & Plan Note (Signed)
Supratherapeutic INR. Decreasing dose, recheck in 1 week.

## 2018-11-05 NOTE — Assessment & Plan Note (Signed)
A1c is well controlled at 6.1%. He does need a urine microalbumin today.

## 2018-11-05 NOTE — Progress Notes (Signed)
Subjective:    CC: Multiple issues  HPI: Anticoagulation: Still supratherapeutic, no bleeding.  Diabetes mellitus type 2: Well-controlled.  Back pain: Has residual spinal stenosis at L3-L4, moderate pain, only temporary relief with chiropractic manipulation as well as Solu-Medrol at the last visit, he is agreeable to visit this with his neurosurgeon.  I reviewed the past medical history, family history, social history, surgical history, and allergies today and no changes were needed.  Please see the problem list section below in epic for further details.  Past Medical History: Past Medical History:  Diagnosis Date  . Atrial fibrillation ([place])   . CAD (coronary artery disease)    Stent placed; [place]  . Congestive heart failure (CHF) ([place])   . DVT (deep vein thrombosis) in pregnancy   . High cholesterol   . Hypertension   . OSA (obstructive sleep apnea) 03/21/2017  . Pulmonary embolus [facility])    Past Surgical History: Past Surgical History:  Procedure Laterality Date  . HERNIA REPAIR    . IVC  2014  . lamenectomy    . left orchiectomy     Social History: Social History   Socioeconomic History  . Marital status: Married    Spouse name: Not on file  . Number of children: 5  . Years of education: Not on file  . Highest education level: Not on file  Occupational History  . Not on file  Social Needs  . Financial resource strain: Not on file  . Food insecurity    Worry: Not on file    Inability: Not on file  . Transportation needs    Medical: Not on file    Non-medical: Not on file  Tobacco Use  . Smoking status: Former Smoker    Quit date: 08/10/1999    Years since quitting: 19.2  . Smokeless tobacco: Never Used  Substance and Sexual Activity  . Alcohol use: Yes    Alcohol/week: 0.0 standard drinks    Comment: Rare  . Drug use: No  . Sexual activity: Yes    Partners: Female  Lifestyle  . Physical activity    Days per week: Not on file   Minutes per session: Not on file  . Stress: Not on file  Relationships  . Social Herbalist on phone: Not on file    Gets together: Not on file    Attends religious service: Not on file    Active member of club or organization: Not on file    Attends meetings of clubs or organizations: Not on file    Relationship status: Not on file  Other Topics Concern  . Not on file  Social History Narrative  . Not on file   Family History: Family History  Problem Relation Age of Onset  . Thyroid cancer Mother   . Depression Mother   . High Cholesterol Father   . High blood pressure Father   . Heart disease Father        Atrial fibrillation  . Stroke Other        Grandfather   Allergies: Allergies  Allergen Reactions  . Lipitor [Atorvastatin]     myalgias   Medications: See med rec.  Review of Systems: No fevers, chills, night sweats, weight loss, chest pain, or shortness of breath.   Objective:    General: Well Developed, well nourished, and in no acute distress.  Neuro: Alert and oriented x3, extra-ocular muscles intact, sensation grossly intact.  HEENT: Normocephalic,  atraumatic, pupils equal round reactive to light, neck supple, no masses, no lymphadenopathy, thyroid nonpalpable.  Skin: Warm and dry, no rashes. Cardiac: Regular rate and rhythm, no murmurs rubs or gallops, no lower extremity edema.  Respiratory: Clear to auscultation bilaterally. Not using accessory muscles, speaking in full sentences.  Impression and Recommendations:    Spinal stenosis of lumbar region History of L2-L5 lumbar decompression. We did Solu-Medrol and prednisone at the last visit with only temporary relief. He got temporary relief from chiropractic manipulation as is typical. Recent MRI postsurgical shows residual stenosis moderate to severe at L3-4. At this point I have advised him to touch base again with his neurosurgeon.  Controlled type 2 diabetes mellitus without complication,  without long-term current use of insulin (HCC) A1c is well controlled at 6.1%. He does need a urine microalbumin today.  History of pulmonary embolism Supratherapeutic INR. Decreasing dose, recheck in 1 week.  I spent 25 minutes with this patient, greater than 50% was face-to-face time counseling regarding the above diagnoses.  ___________________________________________ Gwen Her. Dianah Field, M.D., ABFM., CAQSM. Primary Care and Sports Medicine [place] [facility]  Adjunct Professor of [place] of [facility] of Medicine

## 2018-11-12 ENCOUNTER — Ambulatory Visit: Payer: Medicare Other

## 2018-11-14 ENCOUNTER — Other Ambulatory Visit: Payer: Self-pay

## 2018-11-14 ENCOUNTER — Ambulatory Visit (INDEPENDENT_AMBULATORY_CARE_PROVIDER_SITE_OTHER): Payer: Medicare Other | Admitting: Sports Medicine

## 2018-11-14 DIAGNOSIS — Z86711 Personal history of pulmonary embolism: Secondary | ICD-10-CM | POA: Diagnosis not present

## 2018-11-14 DIAGNOSIS — I480 Paroxysmal atrial fibrillation: Secondary | ICD-10-CM | POA: Diagnosis not present

## 2018-11-14 LAB — POCT INR: INR: 4.2 — AB (ref 2.0–3.0)

## 2018-11-14 NOTE — Progress Notes (Signed)
Pt in today for INR check. Pt reports taking   1/2 tablet on Wednesady and Sat. And 1 tablet all other days. Tablets are 7.5 mg       Pt reports , no recent hospitalizations, no bleeding or bruising, or SOB. Todays INR was 4.2  .

## 2018-11-14 NOTE — Assessment & Plan Note (Signed)
INR is still supratherapeutic. Decrease dose, 1/2 tablet on Mondays, Wednesdays, and Saturdays and 1 tablet on other days, recheck in 1 week.  Okay to have some greens once or twice a week.

## 2018-12-02 ENCOUNTER — Other Ambulatory Visit: Payer: Self-pay | Admitting: Sports Medicine

## 2018-12-02 DIAGNOSIS — M961 Postlaminectomy syndrome, not elsewhere classified: Secondary | ICD-10-CM | POA: Diagnosis not present

## 2018-12-02 DIAGNOSIS — G894 Chronic pain syndrome: Secondary | ICD-10-CM | POA: Diagnosis not present

## 2018-12-02 DIAGNOSIS — M79652 Pain in left thigh: Secondary | ICD-10-CM | POA: Diagnosis not present

## 2018-12-02 DIAGNOSIS — E785 Hyperlipidemia, unspecified: Secondary | ICD-10-CM

## 2018-12-02 DIAGNOSIS — M545 Low back pain: Secondary | ICD-10-CM | POA: Diagnosis not present

## 2018-12-22 ENCOUNTER — Other Ambulatory Visit: Payer: Self-pay | Admitting: Sports Medicine

## 2018-12-22 DIAGNOSIS — M9905 Segmental and somatic dysfunction of pelvic region: Secondary | ICD-10-CM | POA: Diagnosis not present

## 2018-12-22 DIAGNOSIS — E785 Hyperlipidemia, unspecified: Secondary | ICD-10-CM

## 2018-12-22 DIAGNOSIS — M9902 Segmental and somatic dysfunction of thoracic region: Secondary | ICD-10-CM | POA: Diagnosis not present

## 2018-12-22 DIAGNOSIS — M47816 Spondylosis without myelopathy or radiculopathy, lumbar region: Secondary | ICD-10-CM | POA: Diagnosis not present

## 2018-12-22 DIAGNOSIS — M50322 Other cervical disc degeneration at C5-C6 level: Secondary | ICD-10-CM | POA: Diagnosis not present

## 2018-12-22 DIAGNOSIS — M9903 Segmental and somatic dysfunction of lumbar region: Secondary | ICD-10-CM | POA: Diagnosis not present

## 2018-12-22 DIAGNOSIS — M542 Cervicalgia: Secondary | ICD-10-CM | POA: Diagnosis not present

## 2018-12-22 DIAGNOSIS — M9901 Segmental and somatic dysfunction of cervical region: Secondary | ICD-10-CM | POA: Diagnosis not present

## 2018-12-24 DIAGNOSIS — M50322 Other cervical disc degeneration at C5-C6 level: Secondary | ICD-10-CM | POA: Diagnosis not present

## 2018-12-24 DIAGNOSIS — M9902 Segmental and somatic dysfunction of thoracic region: Secondary | ICD-10-CM | POA: Diagnosis not present

## 2018-12-24 DIAGNOSIS — M542 Cervicalgia: Secondary | ICD-10-CM | POA: Diagnosis not present

## 2018-12-24 DIAGNOSIS — M9903 Segmental and somatic dysfunction of lumbar region: Secondary | ICD-10-CM | POA: Diagnosis not present

## 2018-12-24 DIAGNOSIS — M9905 Segmental and somatic dysfunction of pelvic region: Secondary | ICD-10-CM | POA: Diagnosis not present

## 2018-12-24 DIAGNOSIS — M47816 Spondylosis without myelopathy or radiculopathy, lumbar region: Secondary | ICD-10-CM | POA: Diagnosis not present

## 2018-12-24 DIAGNOSIS — M9901 Segmental and somatic dysfunction of cervical region: Secondary | ICD-10-CM | POA: Diagnosis not present

## 2018-12-24 DIAGNOSIS — M545 Low back pain: Secondary | ICD-10-CM | POA: Diagnosis not present

## 2018-12-30 DIAGNOSIS — M9903 Segmental and somatic dysfunction of lumbar region: Secondary | ICD-10-CM | POA: Diagnosis not present

## 2018-12-30 DIAGNOSIS — M542 Cervicalgia: Secondary | ICD-10-CM | POA: Diagnosis not present

## 2018-12-30 DIAGNOSIS — M545 Low back pain: Secondary | ICD-10-CM | POA: Diagnosis not present

## 2018-12-30 DIAGNOSIS — M50322 Other cervical disc degeneration at C5-C6 level: Secondary | ICD-10-CM | POA: Diagnosis not present

## 2018-12-30 DIAGNOSIS — M9905 Segmental and somatic dysfunction of pelvic region: Secondary | ICD-10-CM | POA: Diagnosis not present

## 2018-12-30 DIAGNOSIS — M9901 Segmental and somatic dysfunction of cervical region: Secondary | ICD-10-CM | POA: Diagnosis not present

## 2018-12-30 DIAGNOSIS — M9902 Segmental and somatic dysfunction of thoracic region: Secondary | ICD-10-CM | POA: Diagnosis not present

## 2018-12-30 DIAGNOSIS — M47816 Spondylosis without myelopathy or radiculopathy, lumbar region: Secondary | ICD-10-CM | POA: Diagnosis not present

## 2018-12-31 DIAGNOSIS — M9902 Segmental and somatic dysfunction of thoracic region: Secondary | ICD-10-CM | POA: Diagnosis not present

## 2018-12-31 DIAGNOSIS — M9905 Segmental and somatic dysfunction of pelvic region: Secondary | ICD-10-CM | POA: Diagnosis not present

## 2018-12-31 DIAGNOSIS — M9903 Segmental and somatic dysfunction of lumbar region: Secondary | ICD-10-CM | POA: Diagnosis not present

## 2018-12-31 DIAGNOSIS — M47816 Spondylosis without myelopathy or radiculopathy, lumbar region: Secondary | ICD-10-CM | POA: Diagnosis not present

## 2018-12-31 DIAGNOSIS — M545 Low back pain: Secondary | ICD-10-CM | POA: Diagnosis not present

## 2018-12-31 DIAGNOSIS — M542 Cervicalgia: Secondary | ICD-10-CM | POA: Diagnosis not present

## 2018-12-31 DIAGNOSIS — M50322 Other cervical disc degeneration at C5-C6 level: Secondary | ICD-10-CM | POA: Diagnosis not present

## 2018-12-31 DIAGNOSIS — M9901 Segmental and somatic dysfunction of cervical region: Secondary | ICD-10-CM | POA: Diagnosis not present

## 2019-01-01 DIAGNOSIS — M542 Cervicalgia: Secondary | ICD-10-CM | POA: Diagnosis not present

## 2019-01-01 DIAGNOSIS — M9902 Segmental and somatic dysfunction of thoracic region: Secondary | ICD-10-CM | POA: Diagnosis not present

## 2019-01-01 DIAGNOSIS — M545 Low back pain: Secondary | ICD-10-CM | POA: Diagnosis not present

## 2019-01-01 DIAGNOSIS — M9903 Segmental and somatic dysfunction of lumbar region: Secondary | ICD-10-CM | POA: Diagnosis not present

## 2019-01-01 DIAGNOSIS — M50322 Other cervical disc degeneration at C5-C6 level: Secondary | ICD-10-CM | POA: Diagnosis not present

## 2019-01-01 DIAGNOSIS — M9905 Segmental and somatic dysfunction of pelvic region: Secondary | ICD-10-CM | POA: Diagnosis not present

## 2019-01-01 DIAGNOSIS — M9901 Segmental and somatic dysfunction of cervical region: Secondary | ICD-10-CM | POA: Diagnosis not present

## 2019-01-01 DIAGNOSIS — M47816 Spondylosis without myelopathy or radiculopathy, lumbar region: Secondary | ICD-10-CM | POA: Diagnosis not present

## 2019-01-05 DIAGNOSIS — M9902 Segmental and somatic dysfunction of thoracic region: Secondary | ICD-10-CM | POA: Diagnosis not present

## 2019-01-05 DIAGNOSIS — M50322 Other cervical disc degeneration at C5-C6 level: Secondary | ICD-10-CM | POA: Diagnosis not present

## 2019-01-05 DIAGNOSIS — M9905 Segmental and somatic dysfunction of pelvic region: Secondary | ICD-10-CM | POA: Diagnosis not present

## 2019-01-05 DIAGNOSIS — M9903 Segmental and somatic dysfunction of lumbar region: Secondary | ICD-10-CM | POA: Diagnosis not present

## 2019-01-05 DIAGNOSIS — M47816 Spondylosis without myelopathy or radiculopathy, lumbar region: Secondary | ICD-10-CM | POA: Diagnosis not present

## 2019-01-05 DIAGNOSIS — M9901 Segmental and somatic dysfunction of cervical region: Secondary | ICD-10-CM | POA: Diagnosis not present

## 2019-01-05 DIAGNOSIS — M542 Cervicalgia: Secondary | ICD-10-CM | POA: Diagnosis not present

## 2019-01-05 DIAGNOSIS — M545 Low back pain: Secondary | ICD-10-CM | POA: Diagnosis not present

## 2019-01-06 DIAGNOSIS — M50322 Other cervical disc degeneration at C5-C6 level: Secondary | ICD-10-CM | POA: Diagnosis not present

## 2019-01-06 DIAGNOSIS — M47816 Spondylosis without myelopathy or radiculopathy, lumbar region: Secondary | ICD-10-CM | POA: Diagnosis not present

## 2019-01-06 DIAGNOSIS — M9901 Segmental and somatic dysfunction of cervical region: Secondary | ICD-10-CM | POA: Diagnosis not present

## 2019-01-06 DIAGNOSIS — M9905 Segmental and somatic dysfunction of pelvic region: Secondary | ICD-10-CM | POA: Diagnosis not present

## 2019-01-06 DIAGNOSIS — M9903 Segmental and somatic dysfunction of lumbar region: Secondary | ICD-10-CM | POA: Diagnosis not present

## 2019-01-06 DIAGNOSIS — M545 Low back pain: Secondary | ICD-10-CM | POA: Diagnosis not present

## 2019-01-06 DIAGNOSIS — M542 Cervicalgia: Secondary | ICD-10-CM | POA: Diagnosis not present

## 2019-01-06 DIAGNOSIS — M9902 Segmental and somatic dysfunction of thoracic region: Secondary | ICD-10-CM | POA: Diagnosis not present

## 2019-01-08 DIAGNOSIS — M47816 Spondylosis without myelopathy or radiculopathy, lumbar region: Secondary | ICD-10-CM | POA: Diagnosis not present

## 2019-01-08 DIAGNOSIS — M542 Cervicalgia: Secondary | ICD-10-CM | POA: Diagnosis not present

## 2019-01-08 DIAGNOSIS — M50322 Other cervical disc degeneration at C5-C6 level: Secondary | ICD-10-CM | POA: Diagnosis not present

## 2019-01-08 DIAGNOSIS — M9902 Segmental and somatic dysfunction of thoracic region: Secondary | ICD-10-CM | POA: Diagnosis not present

## 2019-01-08 DIAGNOSIS — M9903 Segmental and somatic dysfunction of lumbar region: Secondary | ICD-10-CM | POA: Diagnosis not present

## 2019-01-08 DIAGNOSIS — M9901 Segmental and somatic dysfunction of cervical region: Secondary | ICD-10-CM | POA: Diagnosis not present

## 2019-01-08 DIAGNOSIS — M9905 Segmental and somatic dysfunction of pelvic region: Secondary | ICD-10-CM | POA: Diagnosis not present

## 2019-01-08 DIAGNOSIS — M545 Low back pain: Secondary | ICD-10-CM | POA: Diagnosis not present

## 2019-01-12 DIAGNOSIS — M50322 Other cervical disc degeneration at C5-C6 level: Secondary | ICD-10-CM | POA: Diagnosis not present

## 2019-01-12 DIAGNOSIS — M9902 Segmental and somatic dysfunction of thoracic region: Secondary | ICD-10-CM | POA: Diagnosis not present

## 2019-01-12 DIAGNOSIS — M542 Cervicalgia: Secondary | ICD-10-CM | POA: Diagnosis not present

## 2019-01-12 DIAGNOSIS — M545 Low back pain: Secondary | ICD-10-CM | POA: Diagnosis not present

## 2019-01-12 DIAGNOSIS — M47816 Spondylosis without myelopathy or radiculopathy, lumbar region: Secondary | ICD-10-CM | POA: Diagnosis not present

## 2019-01-12 DIAGNOSIS — M9901 Segmental and somatic dysfunction of cervical region: Secondary | ICD-10-CM | POA: Diagnosis not present

## 2019-01-12 DIAGNOSIS — M9905 Segmental and somatic dysfunction of pelvic region: Secondary | ICD-10-CM | POA: Diagnosis not present

## 2019-01-12 DIAGNOSIS — M9903 Segmental and somatic dysfunction of lumbar region: Secondary | ICD-10-CM | POA: Diagnosis not present

## 2019-01-13 ENCOUNTER — Other Ambulatory Visit: Payer: Self-pay | Admitting: Sports Medicine

## 2019-01-14 DIAGNOSIS — M545 Low back pain: Secondary | ICD-10-CM | POA: Diagnosis not present

## 2019-01-14 DIAGNOSIS — M9903 Segmental and somatic dysfunction of lumbar region: Secondary | ICD-10-CM | POA: Diagnosis not present

## 2019-01-14 DIAGNOSIS — M50322 Other cervical disc degeneration at C5-C6 level: Secondary | ICD-10-CM | POA: Diagnosis not present

## 2019-01-14 DIAGNOSIS — M9902 Segmental and somatic dysfunction of thoracic region: Secondary | ICD-10-CM | POA: Diagnosis not present

## 2019-01-14 DIAGNOSIS — M542 Cervicalgia: Secondary | ICD-10-CM | POA: Diagnosis not present

## 2019-01-14 DIAGNOSIS — M9905 Segmental and somatic dysfunction of pelvic region: Secondary | ICD-10-CM | POA: Diagnosis not present

## 2019-01-14 DIAGNOSIS — M9901 Segmental and somatic dysfunction of cervical region: Secondary | ICD-10-CM | POA: Diagnosis not present

## 2019-01-14 DIAGNOSIS — M47816 Spondylosis without myelopathy or radiculopathy, lumbar region: Secondary | ICD-10-CM | POA: Diagnosis not present

## 2019-01-15 DIAGNOSIS — M9903 Segmental and somatic dysfunction of lumbar region: Secondary | ICD-10-CM | POA: Diagnosis not present

## 2019-01-15 DIAGNOSIS — M9902 Segmental and somatic dysfunction of thoracic region: Secondary | ICD-10-CM | POA: Diagnosis not present

## 2019-01-15 DIAGNOSIS — M9901 Segmental and somatic dysfunction of cervical region: Secondary | ICD-10-CM | POA: Diagnosis not present

## 2019-01-15 DIAGNOSIS — M545 Low back pain: Secondary | ICD-10-CM | POA: Diagnosis not present

## 2019-01-15 DIAGNOSIS — M47816 Spondylosis without myelopathy or radiculopathy, lumbar region: Secondary | ICD-10-CM | POA: Diagnosis not present

## 2019-01-15 DIAGNOSIS — M542 Cervicalgia: Secondary | ICD-10-CM | POA: Diagnosis not present

## 2019-01-15 DIAGNOSIS — M9905 Segmental and somatic dysfunction of pelvic region: Secondary | ICD-10-CM | POA: Diagnosis not present

## 2019-01-15 DIAGNOSIS — M50322 Other cervical disc degeneration at C5-C6 level: Secondary | ICD-10-CM | POA: Diagnosis not present

## 2019-01-19 DIAGNOSIS — M9902 Segmental and somatic dysfunction of thoracic region: Secondary | ICD-10-CM | POA: Diagnosis not present

## 2019-01-19 DIAGNOSIS — M542 Cervicalgia: Secondary | ICD-10-CM | POA: Diagnosis not present

## 2019-01-19 DIAGNOSIS — M9901 Segmental and somatic dysfunction of cervical region: Secondary | ICD-10-CM | POA: Diagnosis not present

## 2019-01-19 DIAGNOSIS — M50322 Other cervical disc degeneration at C5-C6 level: Secondary | ICD-10-CM | POA: Diagnosis not present

## 2019-01-19 DIAGNOSIS — M47816 Spondylosis without myelopathy or radiculopathy, lumbar region: Secondary | ICD-10-CM | POA: Diagnosis not present

## 2019-01-19 DIAGNOSIS — M9905 Segmental and somatic dysfunction of pelvic region: Secondary | ICD-10-CM | POA: Diagnosis not present

## 2019-01-19 DIAGNOSIS — M9903 Segmental and somatic dysfunction of lumbar region: Secondary | ICD-10-CM | POA: Diagnosis not present

## 2019-01-21 DIAGNOSIS — M9905 Segmental and somatic dysfunction of pelvic region: Secondary | ICD-10-CM | POA: Diagnosis not present

## 2019-01-21 DIAGNOSIS — M47816 Spondylosis without myelopathy or radiculopathy, lumbar region: Secondary | ICD-10-CM | POA: Diagnosis not present

## 2019-01-21 DIAGNOSIS — M9901 Segmental and somatic dysfunction of cervical region: Secondary | ICD-10-CM | POA: Diagnosis not present

## 2019-01-21 DIAGNOSIS — M9902 Segmental and somatic dysfunction of thoracic region: Secondary | ICD-10-CM | POA: Diagnosis not present

## 2019-01-21 DIAGNOSIS — M542 Cervicalgia: Secondary | ICD-10-CM | POA: Diagnosis not present

## 2019-01-21 DIAGNOSIS — M9903 Segmental and somatic dysfunction of lumbar region: Secondary | ICD-10-CM | POA: Diagnosis not present

## 2019-01-21 DIAGNOSIS — M50322 Other cervical disc degeneration at C5-C6 level: Secondary | ICD-10-CM | POA: Diagnosis not present

## 2019-01-26 DIAGNOSIS — M9901 Segmental and somatic dysfunction of cervical region: Secondary | ICD-10-CM | POA: Diagnosis not present

## 2019-01-26 DIAGNOSIS — M50322 Other cervical disc degeneration at C5-C6 level: Secondary | ICD-10-CM | POA: Diagnosis not present

## 2019-01-26 DIAGNOSIS — M542 Cervicalgia: Secondary | ICD-10-CM | POA: Diagnosis not present

## 2019-01-26 DIAGNOSIS — M9902 Segmental and somatic dysfunction of thoracic region: Secondary | ICD-10-CM | POA: Diagnosis not present

## 2019-01-26 DIAGNOSIS — M9903 Segmental and somatic dysfunction of lumbar region: Secondary | ICD-10-CM | POA: Diagnosis not present

## 2019-01-26 DIAGNOSIS — M47816 Spondylosis without myelopathy or radiculopathy, lumbar region: Secondary | ICD-10-CM | POA: Diagnosis not present

## 2019-01-26 DIAGNOSIS — M9905 Segmental and somatic dysfunction of pelvic region: Secondary | ICD-10-CM | POA: Diagnosis not present

## 2019-01-28 DIAGNOSIS — G894 Chronic pain syndrome: Secondary | ICD-10-CM | POA: Diagnosis not present

## 2019-01-28 DIAGNOSIS — M25552 Pain in left hip: Secondary | ICD-10-CM | POA: Diagnosis not present

## 2019-01-28 DIAGNOSIS — Z79891 Long term (current) use of opiate analgesic: Secondary | ICD-10-CM | POA: Diagnosis not present

## 2019-01-28 DIAGNOSIS — M25561 Pain in right knee: Secondary | ICD-10-CM | POA: Diagnosis not present

## 2019-01-28 DIAGNOSIS — M961 Postlaminectomy syndrome, not elsewhere classified: Secondary | ICD-10-CM | POA: Diagnosis not present

## 2019-01-28 DIAGNOSIS — Z79899 Other long term (current) drug therapy: Secondary | ICD-10-CM | POA: Diagnosis not present

## 2019-01-29 DIAGNOSIS — M47816 Spondylosis without myelopathy or radiculopathy, lumbar region: Secondary | ICD-10-CM | POA: Diagnosis not present

## 2019-01-29 DIAGNOSIS — M9905 Segmental and somatic dysfunction of pelvic region: Secondary | ICD-10-CM | POA: Diagnosis not present

## 2019-01-29 DIAGNOSIS — M9901 Segmental and somatic dysfunction of cervical region: Secondary | ICD-10-CM | POA: Diagnosis not present

## 2019-01-29 DIAGNOSIS — M542 Cervicalgia: Secondary | ICD-10-CM | POA: Diagnosis not present

## 2019-01-29 DIAGNOSIS — M9902 Segmental and somatic dysfunction of thoracic region: Secondary | ICD-10-CM | POA: Diagnosis not present

## 2019-01-29 DIAGNOSIS — M9903 Segmental and somatic dysfunction of lumbar region: Secondary | ICD-10-CM | POA: Diagnosis not present

## 2019-01-29 DIAGNOSIS — M50322 Other cervical disc degeneration at C5-C6 level: Secondary | ICD-10-CM | POA: Diagnosis not present

## 2019-02-02 DIAGNOSIS — M50322 Other cervical disc degeneration at C5-C6 level: Secondary | ICD-10-CM | POA: Diagnosis not present

## 2019-02-02 DIAGNOSIS — M9901 Segmental and somatic dysfunction of cervical region: Secondary | ICD-10-CM | POA: Diagnosis not present

## 2019-02-02 DIAGNOSIS — M9905 Segmental and somatic dysfunction of pelvic region: Secondary | ICD-10-CM | POA: Diagnosis not present

## 2019-02-02 DIAGNOSIS — M542 Cervicalgia: Secondary | ICD-10-CM | POA: Diagnosis not present

## 2019-02-02 DIAGNOSIS — M47816 Spondylosis without myelopathy or radiculopathy, lumbar region: Secondary | ICD-10-CM | POA: Diagnosis not present

## 2019-02-02 DIAGNOSIS — M9902 Segmental and somatic dysfunction of thoracic region: Secondary | ICD-10-CM | POA: Diagnosis not present

## 2019-02-02 DIAGNOSIS — M9903 Segmental and somatic dysfunction of lumbar region: Secondary | ICD-10-CM | POA: Diagnosis not present

## 2019-02-05 ENCOUNTER — Other Ambulatory Visit: Payer: Self-pay | Admitting: Physician Assistant

## 2019-02-05 ENCOUNTER — Ambulatory Visit (INDEPENDENT_AMBULATORY_CARE_PROVIDER_SITE_OTHER): Payer: Medicare Other

## 2019-02-05 ENCOUNTER — Other Ambulatory Visit: Payer: Self-pay

## 2019-02-05 ENCOUNTER — Ambulatory Visit: Payer: Medicare Other | Admitting: Sports Medicine

## 2019-02-05 DIAGNOSIS — M25552 Pain in left hip: Secondary | ICD-10-CM

## 2019-02-05 DIAGNOSIS — M542 Cervicalgia: Secondary | ICD-10-CM | POA: Diagnosis not present

## 2019-02-05 DIAGNOSIS — M9903 Segmental and somatic dysfunction of lumbar region: Secondary | ICD-10-CM | POA: Diagnosis not present

## 2019-02-05 DIAGNOSIS — M9905 Segmental and somatic dysfunction of pelvic region: Secondary | ICD-10-CM | POA: Diagnosis not present

## 2019-02-05 DIAGNOSIS — M9902 Segmental and somatic dysfunction of thoracic region: Secondary | ICD-10-CM | POA: Diagnosis not present

## 2019-02-05 DIAGNOSIS — M47816 Spondylosis without myelopathy or radiculopathy, lumbar region: Secondary | ICD-10-CM | POA: Diagnosis not present

## 2019-02-05 DIAGNOSIS — M50322 Other cervical disc degeneration at C5-C6 level: Secondary | ICD-10-CM | POA: Diagnosis not present

## 2019-02-05 DIAGNOSIS — M9901 Segmental and somatic dysfunction of cervical region: Secondary | ICD-10-CM | POA: Diagnosis not present

## 2019-02-09 DIAGNOSIS — M47816 Spondylosis without myelopathy or radiculopathy, lumbar region: Secondary | ICD-10-CM | POA: Diagnosis not present

## 2019-02-09 DIAGNOSIS — M9901 Segmental and somatic dysfunction of cervical region: Secondary | ICD-10-CM | POA: Diagnosis not present

## 2019-02-09 DIAGNOSIS — M542 Cervicalgia: Secondary | ICD-10-CM | POA: Diagnosis not present

## 2019-02-09 DIAGNOSIS — M50322 Other cervical disc degeneration at C5-C6 level: Secondary | ICD-10-CM | POA: Diagnosis not present

## 2019-02-09 DIAGNOSIS — M9903 Segmental and somatic dysfunction of lumbar region: Secondary | ICD-10-CM | POA: Diagnosis not present

## 2019-02-09 DIAGNOSIS — M9902 Segmental and somatic dysfunction of thoracic region: Secondary | ICD-10-CM | POA: Diagnosis not present

## 2019-02-09 DIAGNOSIS — M9905 Segmental and somatic dysfunction of pelvic region: Secondary | ICD-10-CM | POA: Diagnosis not present

## 2019-02-12 ENCOUNTER — Other Ambulatory Visit: Payer: Self-pay | Admitting: Sports Medicine

## 2019-02-12 DIAGNOSIS — M47816 Spondylosis without myelopathy or radiculopathy, lumbar region: Secondary | ICD-10-CM | POA: Diagnosis not present

## 2019-02-12 DIAGNOSIS — M9905 Segmental and somatic dysfunction of pelvic region: Secondary | ICD-10-CM | POA: Diagnosis not present

## 2019-02-12 DIAGNOSIS — M9903 Segmental and somatic dysfunction of lumbar region: Secondary | ICD-10-CM | POA: Diagnosis not present

## 2019-02-12 DIAGNOSIS — M9901 Segmental and somatic dysfunction of cervical region: Secondary | ICD-10-CM | POA: Diagnosis not present

## 2019-02-12 DIAGNOSIS — M50322 Other cervical disc degeneration at C5-C6 level: Secondary | ICD-10-CM | POA: Diagnosis not present

## 2019-02-12 DIAGNOSIS — M9902 Segmental and somatic dysfunction of thoracic region: Secondary | ICD-10-CM | POA: Diagnosis not present

## 2019-02-12 DIAGNOSIS — M542 Cervicalgia: Secondary | ICD-10-CM | POA: Diagnosis not present

## 2019-02-18 DIAGNOSIS — Z79899 Other long term (current) drug therapy: Secondary | ICD-10-CM | POA: Diagnosis not present

## 2019-02-18 DIAGNOSIS — Z79891 Long term (current) use of opiate analgesic: Secondary | ICD-10-CM | POA: Diagnosis not present

## 2019-02-18 DIAGNOSIS — G894 Chronic pain syndrome: Secondary | ICD-10-CM | POA: Diagnosis not present

## 2019-02-19 DIAGNOSIS — M9905 Segmental and somatic dysfunction of pelvic region: Secondary | ICD-10-CM | POA: Diagnosis not present

## 2019-02-19 DIAGNOSIS — M9903 Segmental and somatic dysfunction of lumbar region: Secondary | ICD-10-CM | POA: Diagnosis not present

## 2019-02-19 DIAGNOSIS — M9902 Segmental and somatic dysfunction of thoracic region: Secondary | ICD-10-CM | POA: Diagnosis not present

## 2019-02-19 DIAGNOSIS — M50322 Other cervical disc degeneration at C5-C6 level: Secondary | ICD-10-CM | POA: Diagnosis not present

## 2019-02-19 DIAGNOSIS — M542 Cervicalgia: Secondary | ICD-10-CM | POA: Diagnosis not present

## 2019-02-19 DIAGNOSIS — M9901 Segmental and somatic dysfunction of cervical region: Secondary | ICD-10-CM | POA: Diagnosis not present

## 2019-02-19 DIAGNOSIS — M47816 Spondylosis without myelopathy or radiculopathy, lumbar region: Secondary | ICD-10-CM | POA: Diagnosis not present

## 2019-02-25 DIAGNOSIS — Z79899 Other long term (current) drug therapy: Secondary | ICD-10-CM | POA: Diagnosis not present

## 2019-02-25 DIAGNOSIS — G894 Chronic pain syndrome: Secondary | ICD-10-CM | POA: Diagnosis not present

## 2019-02-25 DIAGNOSIS — M961 Postlaminectomy syndrome, not elsewhere classified: Secondary | ICD-10-CM | POA: Diagnosis not present

## 2019-02-25 DIAGNOSIS — Z79891 Long term (current) use of opiate analgesic: Secondary | ICD-10-CM | POA: Diagnosis not present

## 2019-02-25 DIAGNOSIS — M79652 Pain in left thigh: Secondary | ICD-10-CM | POA: Diagnosis not present

## 2019-02-25 DIAGNOSIS — M25552 Pain in left hip: Secondary | ICD-10-CM | POA: Diagnosis not present

## 2019-02-26 DIAGNOSIS — M9903 Segmental and somatic dysfunction of lumbar region: Secondary | ICD-10-CM | POA: Diagnosis not present

## 2019-02-26 DIAGNOSIS — M542 Cervicalgia: Secondary | ICD-10-CM | POA: Diagnosis not present

## 2019-02-26 DIAGNOSIS — M9902 Segmental and somatic dysfunction of thoracic region: Secondary | ICD-10-CM | POA: Diagnosis not present

## 2019-02-26 DIAGNOSIS — M9901 Segmental and somatic dysfunction of cervical region: Secondary | ICD-10-CM | POA: Diagnosis not present

## 2019-02-26 DIAGNOSIS — M47816 Spondylosis without myelopathy or radiculopathy, lumbar region: Secondary | ICD-10-CM | POA: Diagnosis not present

## 2019-02-26 DIAGNOSIS — M50322 Other cervical disc degeneration at C5-C6 level: Secondary | ICD-10-CM | POA: Diagnosis not present

## 2019-02-26 DIAGNOSIS — M9905 Segmental and somatic dysfunction of pelvic region: Secondary | ICD-10-CM | POA: Diagnosis not present

## 2019-03-04 ENCOUNTER — Other Ambulatory Visit: Payer: Self-pay | Admitting: Sports Medicine

## 2019-03-04 DIAGNOSIS — E785 Hyperlipidemia, unspecified: Secondary | ICD-10-CM

## 2019-03-24 ENCOUNTER — Other Ambulatory Visit: Payer: Self-pay | Admitting: Sports Medicine

## 2019-03-24 DIAGNOSIS — E785 Hyperlipidemia, unspecified: Secondary | ICD-10-CM

## 2019-04-14 ENCOUNTER — Other Ambulatory Visit: Payer: Self-pay | Admitting: Sports Medicine

## 2019-06-04 ENCOUNTER — Other Ambulatory Visit: Payer: Self-pay | Admitting: Sports Medicine

## 2019-06-04 DIAGNOSIS — E785 Hyperlipidemia, unspecified: Secondary | ICD-10-CM

## 2019-06-26 ENCOUNTER — Other Ambulatory Visit: Payer: Self-pay | Admitting: Sports Medicine

## 2019-06-26 ENCOUNTER — Other Ambulatory Visit: Payer: Self-pay

## 2019-06-26 DIAGNOSIS — I480 Paroxysmal atrial fibrillation: Secondary | ICD-10-CM

## 2019-06-26 DIAGNOSIS — E785 Hyperlipidemia, unspecified: Secondary | ICD-10-CM

## 2019-06-26 DIAGNOSIS — E119 Type 2 diabetes mellitus without complications: Secondary | ICD-10-CM

## 2019-06-26 MED ORDER — FENOFIBRATE 160 MG PO TABS: 160.0000 mg | ORAL_TABLET | Freq: Every day | ORAL | 0 refills | Status: DC

## 2019-06-26 MED ORDER — WARFARIN SODIUM 7.5 MG PO TABS: 7.5000 mg | ORAL_TABLET | Freq: Every day | ORAL | 0 refills | Status: DC

## 2019-06-29 ENCOUNTER — Other Ambulatory Visit: Payer: Self-pay

## 2019-06-29 ENCOUNTER — Encounter: Payer: Self-pay | Admitting: Sports Medicine

## 2019-06-29 ENCOUNTER — Ambulatory Visit (INDEPENDENT_AMBULATORY_CARE_PROVIDER_SITE_OTHER): Payer: Medicare Other | Admitting: Sports Medicine

## 2019-06-29 ENCOUNTER — Ambulatory Visit (INDEPENDENT_AMBULATORY_CARE_PROVIDER_SITE_OTHER): Payer: Medicare Other

## 2019-06-29 VITALS — BP 138/73 | HR 72 | Ht 74.0 in | Wt 281.0 lb

## 2019-06-29 DIAGNOSIS — Z86711 Personal history of pulmonary embolism: Secondary | ICD-10-CM

## 2019-06-29 DIAGNOSIS — M19012 Primary osteoarthritis, left shoulder: Secondary | ICD-10-CM

## 2019-06-29 DIAGNOSIS — M19011 Primary osteoarthritis, right shoulder: Secondary | ICD-10-CM | POA: Diagnosis not present

## 2019-06-29 DIAGNOSIS — T50905A Adverse effect of unspecified drugs, medicaments and biological substances, initial encounter: Secondary | ICD-10-CM

## 2019-06-29 DIAGNOSIS — I480 Paroxysmal atrial fibrillation: Secondary | ICD-10-CM | POA: Diagnosis not present

## 2019-06-29 DIAGNOSIS — E119 Type 2 diabetes mellitus without complications: Secondary | ICD-10-CM

## 2019-06-29 LAB — POCT INR: INR: 1.3 — AB (ref 2.0–3.0)

## 2019-06-29 MED ORDER — GLIPIZIDE 5 MG PO TABS: 5.0000 mg | ORAL_TABLET | Freq: Two times a day (BID) | ORAL | 1 refills | Status: DC

## 2019-06-29 MED ORDER — PHENTERMINE HCL 37.5 MG PO TABS: ORAL_TABLET | ORAL | 0 refills | Status: DC

## 2019-06-29 NOTE — Assessment & Plan Note (Addendum)
INR is subtherapeutic, he is on a half tab 3 days a week, Monday, Wednesday, Saturday, switching to one half tab 2 days a week, a full tab 5 days a week, return to reevaluate in 2 weeks. Target is 2.5-3.5 per hematology oncology.

## 2019-06-29 NOTE — Assessment & Plan Note (Signed)
Restarting phentermine, patient did great previously. Return monthly for weight checks and refills.

## 2019-06-29 NOTE — Assessment & Plan Note (Signed)
Diabetes is historically well controlled,Rechecking labs. Refilling glipizide.

## 2019-06-29 NOTE — Progress Notes (Addendum)
    Procedures performed today:    Procedure: Real-time Ultrasound Guided injection of the right glenohumeral joint Device: Samsung HS60  Verbal informed consent obtained.  Time-out conducted.  Noted no overlying erythema, induration, or other signs of local infection.  Skin prepped in a sterile fashion.  Local anesthesia: Topical Ethyl chloride.  With sterile technique and under real time ultrasound guidance: 1 cc Kenalog 40, 2 cc lidocaine, 2 cc bupivacaine injected easily Completed without difficulty  Pain immediately resolved suggesting accurate placement of the medication.  Advised to call if fevers/chills, erythema, induration, drainage, or persistent bleeding.  Images permanently stored and available for review in the ultrasound unit.  Impression: Technically successful ultrasound guided injection.  Independent interpretation of notes and tests performed by another provider:   None.  Impression and Recommendations:    Primary osteoarthritis of both shoulders Jeff Green returns, he has right shoulder pain, severe, present since September of last year. He did endorse a fall. On exam he has significant deficits in external rotation all consistent with glenohumeral pathology. I can see significant glenohumeral osteoarthritis on ultrasound, this was injected today. X-rays, return to see me in 1 month.  Morbid obesity ([place]) Restarting phentermine, patient did great previously. Return monthly for weight checks and refills.  Paroxysmal atrial fibrillation (HCC) INR is subtherapeutic, he is on a half tab 3 days a week, Monday, Wednesday, Saturday, switching to one half tab 2 days a week, a full tab 5 days a week, return to reevaluate in 2 weeks. Target is 2.5-3.5 per hematology oncology.  Controlled type 2 diabetes mellitus without complication, without long-term current use of insulin ([place]) Diabetes is historically well controlled,Rechecking labs. Refilling  glipizide.  Hypercalcemia due to a drug Initially suspected to be due to his vitamin D supplementation, this was stopped last year, repeat calcium levels continue to be elevated. He will return to the lab for repeat PTH levels and I would like a second opinion from endocrinology.    ___________________________________________ Jeff Green. Dianah Field, M.D., ABFM., CAQSM. Primary Care and [place] Instructor of [place] of [facility] of Medicine

## 2019-06-29 NOTE — Assessment & Plan Note (Signed)
Jeff Green returns, he has right shoulder pain, severe, present since September of last year. He did endorse a fall. On exam he has significant deficits in external rotation all consistent with glenohumeral pathology. I can see significant glenohumeral osteoarthritis on ultrasound, this was injected today. X-rays, return to see me in 1 month.

## 2019-06-29 NOTE — Patient Instructions (Signed)
switching to one half tab Mondays and Wednesdays, a full tab the 5 other days a week, return to reevaluate in 2 weeks.

## 2019-07-10 ENCOUNTER — Other Ambulatory Visit: Payer: Self-pay

## 2019-07-10 ENCOUNTER — Ambulatory Visit (INDEPENDENT_AMBULATORY_CARE_PROVIDER_SITE_OTHER): Payer: Medicare Other | Admitting: Sports Medicine

## 2019-07-10 DIAGNOSIS — Z86711 Personal history of pulmonary embolism: Secondary | ICD-10-CM

## 2019-07-10 DIAGNOSIS — I480 Paroxysmal atrial fibrillation: Secondary | ICD-10-CM

## 2019-07-10 DIAGNOSIS — E119 Type 2 diabetes mellitus without complications: Secondary | ICD-10-CM | POA: Diagnosis not present

## 2019-07-10 LAB — POCT INR: INR: 1.8 — AB (ref 2.0–3.0)

## 2019-07-11 LAB — COMPLETE METABOLIC PANEL WITH GFR
AG Ratio: 1.7 (calc) (ref 1.0–2.5)
ALT: 19 U/L (ref 9–46)
AST: 18 U/L (ref 10–35)
Albumin: 4.5 g/dL (ref 3.6–5.1)
Alkaline phosphatase (APISO): 55 U/L (ref 35–144)
BUN/Creatinine Ratio: 16 (calc) (ref 6–22)
BUN: 31 mg/dL — ABNORMAL HIGH (ref 7–25)
CO2: 26 mmol/L (ref 20–32)
Calcium: 11 mg/dL — ABNORMAL HIGH (ref 8.6–10.3)
Chloride: 104 mmol/L (ref 98–110)
Creat: 1.9 mg/dL — ABNORMAL HIGH (ref 0.70–1.25)
GFR, Est African American: 43 mL/min/{1.73_m2} — ABNORMAL LOW (ref >=60)
GFR, Est Non African American: 37 mL/min/{1.73_m2} — ABNORMAL LOW (ref >=60)
Globulin: 2.6 g/dL (calc) (ref 1.9–3.7)
Glucose, Bld: 114 mg/dL (ref 65–139)
Potassium: 4.2 mmol/L (ref 3.5–5.3)
Sodium: 139 mmol/L (ref 135–146)
Total Bilirubin: 0.6 mg/dL (ref 0.2–1.2)
Total Protein: 7.1 g/dL (ref 6.1–8.1)

## 2019-07-11 LAB — MICROALBUMIN / CREATININE URINE RATIO
Creatinine, Urine: 137 mg/dL (ref 20–320)
Microalb Creat Ratio: 9 mcg/mg creat (ref <30)
Microalb, Ur: 1.2 mg/dL

## 2019-07-11 LAB — CBC
HCT: 49.8 % (ref 38.5–50.0)
Hemoglobin: 16.4 g/dL (ref 13.2–17.1)
MCH: 30.8 pg (ref 27.0–33.0)
MCHC: 32.9 g/dL (ref 32.0–36.0)
MCV: 93.6 fL (ref 80.0–100.0)
MPV: 13.8 fL — ABNORMAL HIGH (ref 7.5–12.5)
Platelets: 159 10*3/uL (ref 140–400)
RBC: 5.32 10*6/uL (ref 4.20–5.80)
RDW: 13 % (ref 11.0–15.0)
WBC: 9 10*3/uL (ref 3.8–10.8)

## 2019-07-11 LAB — HEMOGLOBIN A1C
Hgb A1c MFr Bld: 5.7 % of total Hgb — ABNORMAL HIGH (ref <5.7)
Mean Plasma Glucose: 117 (calc)
eAG (mmol/L): 6.5 (calc)

## 2019-07-11 LAB — LIPID PANEL W/REFLEX DIRECT LDL
Cholesterol: 174 mg/dL (ref <200)
HDL: 57 mg/dL (ref >=40)
LDL Cholesterol (Calc): 92 mg/dL (calc)
Non-HDL Cholesterol (Calc): 117 mg/dL (calc) (ref <130)
Total CHOL/HDL Ratio: 3.1 (calc) (ref <5.0)
Triglycerides: 149 mg/dL (ref <150)

## 2019-07-11 LAB — TSH: TSH: 1.59 mIU/L (ref 0.40–4.50)

## 2019-07-11 NOTE — Addendum Note (Signed)
Addended by: Silverio Decamp on: 07/11/2019 09:53 AM   Modules accepted: Orders

## 2019-07-11 NOTE — Assessment & Plan Note (Addendum)
Initially suspected to be due to his vitamin D supplementation, this was stopped last year, repeat calcium levels continue to be elevated. He will return to the lab for repeat PTH levels and I would like a second opinion from endocrinology.

## 2019-07-14 DIAGNOSIS — T50905A Adverse effect of unspecified drugs, medicaments and biological substances, initial encounter: Secondary | ICD-10-CM | POA: Diagnosis not present

## 2019-07-15 ENCOUNTER — Telehealth: Payer: Self-pay

## 2019-07-15 LAB — PTH, INTACT AND CALCIUM
Calcium: 10.6 mg/dL — ABNORMAL HIGH (ref 8.6–10.3)
PTH: 34 pg/mL (ref 14–64)

## 2019-07-15 NOTE — Telephone Encounter (Signed)
Made appointment with Endocrinology for 09/01/19.  He's on a waiting list for sooner appointment.   Just an FYI.

## 2019-07-16 ENCOUNTER — Other Ambulatory Visit: Payer: Self-pay | Admitting: Sports Medicine

## 2019-07-27 ENCOUNTER — Other Ambulatory Visit: Payer: Self-pay

## 2019-07-27 ENCOUNTER — Encounter: Payer: Self-pay | Admitting: Sports Medicine

## 2019-07-27 ENCOUNTER — Ambulatory Visit (INDEPENDENT_AMBULATORY_CARE_PROVIDER_SITE_OTHER): Payer: Medicare Other | Admitting: Sports Medicine

## 2019-07-27 DIAGNOSIS — Z86711 Personal history of pulmonary embolism: Secondary | ICD-10-CM

## 2019-07-27 DIAGNOSIS — I480 Paroxysmal atrial fibrillation: Secondary | ICD-10-CM

## 2019-07-27 DIAGNOSIS — M19012 Primary osteoarthritis, left shoulder: Secondary | ICD-10-CM | POA: Diagnosis not present

## 2019-07-27 DIAGNOSIS — M19011 Primary osteoarthritis, right shoulder: Secondary | ICD-10-CM | POA: Diagnosis not present

## 2019-07-27 DIAGNOSIS — E785 Hyperlipidemia, unspecified: Secondary | ICD-10-CM

## 2019-07-27 LAB — POCT INR: INR: 2.1 (ref 2.0–3.0)

## 2019-07-27 MED ORDER — FENOFIBRATE 160 MG PO TABS: 160.0000 mg | ORAL_TABLET | Freq: Every day | ORAL | 3 refills | Status: DC

## 2019-07-27 MED ORDER — PHENTERMINE HCL 37.5 MG PO TABS: ORAL_TABLET | ORAL | 0 refills | Status: DC

## 2019-07-27 NOTE — Assessment & Plan Note (Signed)
Jeff Green did extremely well after a right glenohumeral injection at the last visit, for the most part pain-free. Today we did his left shoulder, he requested only lidocaine and bupivacaine today. Return as needed for this. His symptoms were predominantly glenohumeral.

## 2019-07-27 NOTE — Assessment & Plan Note (Signed)
Enzo did great after the first month on phentermine, refilling, he has continued to lose weight.

## 2019-07-27 NOTE — Progress Notes (Signed)
    Procedures performed today:    Procedure: Real-time Ultrasound Guided injection of the left glenohumeral joint Device: Samsung HS60  Verbal informed consent obtained.  Time-out conducted.  Noted no overlying erythema, induration, or other signs of local infection.  Skin prepped in a sterile fashion.  Local anesthesia: Topical Ethyl chloride.  With sterile technique and under real time ultrasound guidance:  I advanced a 22-gauge spinal needle into the internal joint and injected with 3 cc lidocaine, 3 cc bupivacaine completed without difficulty  Pain immediately resolved suggesting accurate placement of the medication.  Advised to call if fevers/chills, erythema, induration, drainage, or persistent bleeding.  Images permanently stored and available for review in the ultrasound unit.  Impression: Technically successful ultrasound guided injection.  Independent interpretation of notes and tests performed by another provider:   None.  Brief History, Exam, Impression, and Recommendations:    Primary osteoarthritis of both shoulders Colby did extremely well after a right glenohumeral injection at the last visit, for the most part pain-free. Today we did his left shoulder, he requested only lidocaine and bupivacaine today. Return as needed for this. His symptoms were predominantly glenohumeral.  Morbid obesity ([place]) Erol did great after the first month on phentermine, refilling, he has continued to lose weight.  Paroxysmal atrial fibrillation (HCC) INR still subtherapeutic albeit better than at the last visit, increase Coumadin dosing to a full tab 7 days a week, recheck in a nurse visit in 2 weeks.    ___________________________________________ Gwen Her. Dianah Field, M.D., ABFM., CAQSM. Primary Care and [place] Instructor of [place] of [facility] of Medicine

## 2019-07-27 NOTE — Assessment & Plan Note (Signed)
INR still subtherapeutic albeit better than at the last visit, increase Coumadin dosing to a full tab 7 days a week, recheck in a nurse visit in 2 weeks.

## 2019-08-10 ENCOUNTER — Other Ambulatory Visit: Payer: Self-pay

## 2019-08-10 ENCOUNTER — Ambulatory Visit (INDEPENDENT_AMBULATORY_CARE_PROVIDER_SITE_OTHER): Payer: Medicare Other | Admitting: Sports Medicine

## 2019-08-10 DIAGNOSIS — I480 Paroxysmal atrial fibrillation: Secondary | ICD-10-CM | POA: Diagnosis not present

## 2019-08-10 DIAGNOSIS — Z86711 Personal history of pulmonary embolism: Secondary | ICD-10-CM

## 2019-08-10 LAB — POCT INR: INR: 2.3 (ref 2.0–3.0)

## 2019-08-10 NOTE — Progress Notes (Signed)
    Procedures performed today:    None.  Independent interpretation of notes and tests performed by another provider:   None.  Brief History, Exam, Impression, and Recommendations:    Paroxysmal atrial fibrillation (HCC) INR is still subtherapeutic, increase to 1.5 tablets on Wednesdays, 1 full tab other days and recheck in 2 weeks.  Target is 2.5-3.5 per hematology oncology. Currently has 7.5 mg tablets    ___________________________________________ Gwen Her. Dianah Field, M.D., ABFM., CAQSM. Primary Care and [place] Instructor of [place] of [facility] of Medicine

## 2019-08-10 NOTE — Assessment & Plan Note (Signed)
INR is still subtherapeutic, increase to 1.5 tablets on Wednesdays, 1 full tab other days and recheck in 2 weeks.  Target is 2.5-3.5 per hematology oncology. Currently has 7.5 mg tablets

## 2019-08-10 NOTE — Progress Notes (Signed)
Left a detailed vm msg for pt regarding change in anticoagulation therapy. Aware to make an appt in 2 wks for a INR recheck. Direct cal back info provided.

## 2019-08-13 ENCOUNTER — Other Ambulatory Visit: Payer: Self-pay

## 2019-08-13 DIAGNOSIS — I480 Paroxysmal atrial fibrillation: Secondary | ICD-10-CM

## 2019-08-13 MED ORDER — WARFARIN SODIUM 7.5 MG PO TABS: 7.5000 mg | ORAL_TABLET | Freq: Every day | ORAL | 3 refills | Status: DC

## 2019-08-24 ENCOUNTER — Ambulatory Visit (INDEPENDENT_AMBULATORY_CARE_PROVIDER_SITE_OTHER): Payer: Medicare Other | Admitting: Sports Medicine

## 2019-08-24 ENCOUNTER — Encounter: Payer: Self-pay | Admitting: Sports Medicine

## 2019-08-24 DIAGNOSIS — I480 Paroxysmal atrial fibrillation: Secondary | ICD-10-CM

## 2019-08-24 DIAGNOSIS — M19011 Primary osteoarthritis, right shoulder: Secondary | ICD-10-CM

## 2019-08-24 DIAGNOSIS — Z86711 Personal history of pulmonary embolism: Secondary | ICD-10-CM | POA: Diagnosis not present

## 2019-08-24 DIAGNOSIS — E119 Type 2 diabetes mellitus without complications: Secondary | ICD-10-CM | POA: Diagnosis not present

## 2019-08-24 DIAGNOSIS — M19012 Primary osteoarthritis, left shoulder: Secondary | ICD-10-CM

## 2019-08-24 LAB — POCT INR: INR: 4.2 — AB (ref 2.0–3.0)

## 2019-08-24 MED ORDER — TRAMADOL HCL 50 MG PO TABS: 50.0000 mg | ORAL_TABLET | Freq: Three times a day (TID) | ORAL | 0 refills | Status: DC | PRN

## 2019-08-24 MED ORDER — TOPIRAMATE 100 MG PO TABS: 100.0000 mg | ORAL_TABLET | Freq: Two times a day (BID) | ORAL | 2 refills | Status: DC

## 2019-08-24 MED ORDER — TRULICITY 0.75 MG/0.5ML ~~LOC~~ SOAJ: 0.5000 mL | SUBCUTANEOUS | 11 refills | Status: DC

## 2019-08-24 NOTE — Assessment & Plan Note (Signed)
Bilateral glenohumeral osteoarthritis, right-sided glenohumeral joint is doing well, at the last visit he requested lidocaine/bupivacaine only, not surprisingly his pain has returned. I am going to add some tramadol for him to take at night, this will keep him functional. His goal is to avoid the operating room at all costs.

## 2019-08-24 NOTE — Progress Notes (Signed)
    Procedures performed today:    None.  Independent interpretation of notes and tests performed by another provider:   None.  Brief History, Exam, Impression, and Recommendations:    Controlled type 2 diabetes mellitus without complication, without long-term current use of insulin (HCC) Jeff Green returns, he has had trouble losing weight in spite of phentermine, he was on Trulicity in the past. Restarting the low-dose, return monthly for weight check and dose titrations. I think we will ultimately need to be stopping his glipizide.  Paroxysmal atrial fibrillation (HCC) At the last visit INR was subtherapeutic. He is supratherapeutic today with some bleeding gums. Decreasing Coumadin to 1 tablet daily, recheck in a nurse visit in 2 weeks.  Goal is 2.5-3.5 per hematology/oncology recommendations, currently has 7.5 mg tablets.   Primary osteoarthritis of both shoulders Bilateral glenohumeral osteoarthritis, right-sided glenohumeral joint is doing well, at the last visit he requested lidocaine/bupivacaine only, not surprisingly his pain has returned. I am going to add some tramadol for him to take at night, this will keep him functional. His goal is to avoid the operating room at all costs.  Morbid obesity ([place]) Has not lost any weight at this time on phentermine. I am going to restart his GLP-1.    ___________________________________________ Gwen Her. Dianah Field, M.D., ABFM., CAQSM. Primary Care and [place] Instructor of [place] of [facility] of Medicine

## 2019-08-24 NOTE — Patient Instructions (Signed)
Decreasing Coumadin to 1 tablet daily, recheck in a nurse visit in 2 weeks.

## 2019-08-24 NOTE — Assessment & Plan Note (Signed)
Has not lost any weight at this time on phentermine. I am going to restart his GLP-1.

## 2019-08-24 NOTE — Assessment & Plan Note (Signed)
Jeff Green returns, he has had trouble losing weight in spite of phentermine, he was on Trulicity in the past. Restarting the low-dose, return monthly for weight check and dose titrations. I think we will ultimately need to be stopping his glipizide.

## 2019-08-24 NOTE — Assessment & Plan Note (Signed)
At the last visit INR was subtherapeutic. He is supratherapeutic today with some bleeding gums. Decreasing Coumadin to 1 tablet daily, recheck in a nurse visit in 2 weeks.  Goal is 2.5-3.5 per hematology/oncology recommendations, currently has 7.5 mg tablets.

## 2019-09-07 ENCOUNTER — Other Ambulatory Visit: Payer: Self-pay | Admitting: Sports Medicine

## 2019-09-07 DIAGNOSIS — E785 Hyperlipidemia, unspecified: Secondary | ICD-10-CM

## 2019-09-10 ENCOUNTER — Other Ambulatory Visit: Payer: Self-pay

## 2019-09-10 ENCOUNTER — Ambulatory Visit (INDEPENDENT_AMBULATORY_CARE_PROVIDER_SITE_OTHER): Payer: Medicare Other | Admitting: Sports Medicine

## 2019-09-10 ENCOUNTER — Other Ambulatory Visit: Payer: Self-pay | Admitting: Sports Medicine

## 2019-09-10 DIAGNOSIS — M19011 Primary osteoarthritis, right shoulder: Secondary | ICD-10-CM

## 2019-09-10 DIAGNOSIS — I480 Paroxysmal atrial fibrillation: Secondary | ICD-10-CM

## 2019-09-10 DIAGNOSIS — M19012 Primary osteoarthritis, left shoulder: Secondary | ICD-10-CM

## 2019-09-10 DIAGNOSIS — Z86711 Personal history of pulmonary embolism: Secondary | ICD-10-CM | POA: Diagnosis not present

## 2019-09-10 LAB — POCT INR: INR: 4 — AB (ref 2.0–3.0)

## 2019-09-10 NOTE — Progress Notes (Signed)
Patient reports taking Coumadin as follows:  7.5 mg tablets daily.   [name] denies any missed doses of medication, extra doses, bleeding gums (was having this and it has stopped, only had one day of a spot of blood on gums), nose bleeds, antibiotic use, hospitalization, blood in urine, blood in stool, dental procedures, any bruising, abnormal bleeding, or medication changes.  INR was checked today with a reading of 4.0.   I spoke with Dr. Dianah Field who advised for patient to remain on current dosage and have repeat INR in one week.

## 2019-09-10 NOTE — Assessment & Plan Note (Signed)
He is supratherapeutic today no report of any bleeding. Continue current Coumadin dosing of 1 tablet daily as it is starting to drop and his target is 2.5-3.5, recheck in 1 week and a nurse visit.

## 2019-09-11 ENCOUNTER — Other Ambulatory Visit: Payer: Self-pay | Admitting: *Deleted

## 2019-09-11 DIAGNOSIS — I480 Paroxysmal atrial fibrillation: Secondary | ICD-10-CM

## 2019-09-11 MED ORDER — WARFARIN SODIUM 7.5 MG PO TABS: 7.5000 mg | ORAL_TABLET | Freq: Every day | ORAL | 1 refills | Status: DC

## 2019-09-18 ENCOUNTER — Ambulatory Visit: Payer: Medicare Other | Admitting: Sports Medicine

## 2019-09-23 ENCOUNTER — Ambulatory Visit (INDEPENDENT_AMBULATORY_CARE_PROVIDER_SITE_OTHER): Payer: Medicare Other | Admitting: Sports Medicine

## 2019-09-23 ENCOUNTER — Other Ambulatory Visit: Payer: Self-pay

## 2019-09-23 ENCOUNTER — Encounter: Payer: Self-pay | Admitting: Sports Medicine

## 2019-09-23 VITALS — BP 123/81 | HR 71 | Ht 74.0 in | Wt 265.0 lb

## 2019-09-23 DIAGNOSIS — Z86711 Personal history of pulmonary embolism: Secondary | ICD-10-CM | POA: Diagnosis not present

## 2019-09-23 DIAGNOSIS — E119 Type 2 diabetes mellitus without complications: Secondary | ICD-10-CM

## 2019-09-23 DIAGNOSIS — M19012 Primary osteoarthritis, left shoulder: Secondary | ICD-10-CM

## 2019-09-23 DIAGNOSIS — I480 Paroxysmal atrial fibrillation: Secondary | ICD-10-CM | POA: Diagnosis not present

## 2019-09-23 DIAGNOSIS — M19011 Primary osteoarthritis, right shoulder: Secondary | ICD-10-CM

## 2019-09-23 LAB — POCT INR: INR: 4 — AB (ref 2.0–3.0)

## 2019-09-23 LAB — POCT GLYCOSYLATED HEMOGLOBIN (HGB A1C): Hemoglobin A1C: 5.6 % (ref 4.0–5.6)

## 2019-09-23 MED ORDER — TRULICITY 1.5 MG/0.5ML ~~LOC~~ SOAJ: 1.5000 mg | SUBCUTANEOUS | 11 refills | Status: DC

## 2019-09-23 NOTE — Progress Notes (Signed)
    Procedures performed today:    None.  Independent interpretation of notes and tests performed by another provider:   None.  Brief History, Exam, Impression, and Recommendations:    Morbid obesity ([place]) Bookert returns, he has been on phentermine several times without any weight loss, I started Trulicity low-dose, he has lost some good weight. Increasing to 1.5 mg. I think it is probably safe to go ahead and discontinue his glipizide.  Controlled type 2 diabetes mellitus without complication, without long-term current use of insulin (HCC) Samul returns, he has been on phentermine several times without any weight loss, I started Trulicity low-dose, he has lost some good weight. Increasing to 1.5 mg. I think it is probably safe to go ahead and discontinue his glipizide.  Paroxysmal atrial fibrillation (HCC) Continues to be supratherapeutic, he is reporting some bleeding gums and bruising. Decrease Coumadin dosing to one half tab on Wednesday, 1 tab other days. Recheck in 2 weeks.  Primary osteoarthritis of both shoulders Continues to have pain in both shoulders albeit relatively well controlled right now, he is not interested in surgery. He is not interested in any more pain medications either. Left glenohumeral joint was last injected with steroid in February 2020, the right side was last injected with steroid in March. I can inject both whenever he desires.    ___________________________________________ Gwen Her. Dianah Field, M.D., ABFM., CAQSM. Primary Care and [place] Instructor of [place] [facility] of [facility]

## 2019-09-23 NOTE — Assessment & Plan Note (Signed)
Jeff Green returns, he has been on phentermine several times without any weight loss, I started Trulicity low-dose, he has lost some good weight. Increasing to 1.5 mg. I think it is probably safe to go ahead and discontinue his glipizide.

## 2019-09-23 NOTE — Patient Instructions (Signed)
Continues to be supratherapeutic, he is reporting some bleeding gums and bruising. Decrease Coumadin dosing to one half tab on Wednesday, 1 tab other days. Recheck in 2 weeks.

## 2019-09-23 NOTE — Assessment & Plan Note (Signed)
Continues to be supratherapeutic, he is reporting some bleeding gums and bruising. Decrease Coumadin dosing to one half tab on Wednesday, 1 tab other days. Recheck in 2 weeks.

## 2019-09-23 NOTE — Assessment & Plan Note (Signed)
Continues to have pain in both shoulders albeit relatively well controlled right now, he is not interested in surgery. He is not interested in any more pain medications either. Left glenohumeral joint was last injected with steroid in February 2020, the right side was last injected with steroid in March. I can inject both whenever he desires.

## 2019-09-27 IMAGING — MR MR LUMBAR SPINE WO/W CM
10 of 11 series · 43 of 48 positions shown · IV contrast (MULTIHANCE)
Comparison: 10/03/2015

CLINICAL DATA: Spinal stenosis.  Right ankle weakness.

EXAM:
MRI LUMBAR SPINE WITHOUT AND WITH CONTRAST
TECHNIQUE: Multiplanar and multiecho pulse sequences of the lumbar spine were
obtained without and with intravenous contrast.
CONTRAST:  20mL MULTIHANCE GADOBENATE DIMEGLUMINE 529 MG/ML IV SOLN

[Series 2: T1 · sagittal · 4.0mm · 0.81mm/px · 4 of 18 slices shown (1 of 5)]
[im 1/18]
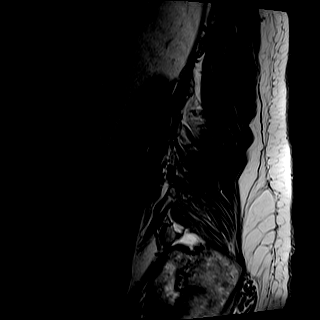
[im 6/18]
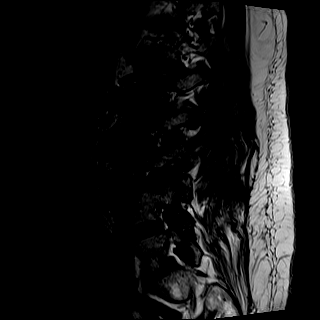
[im 12/18]
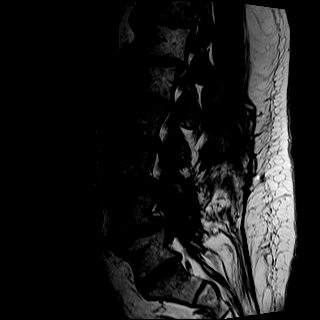
[im 18/18]
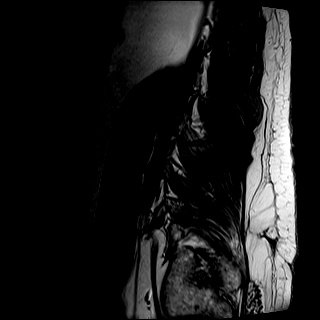

[Series 3: STIR · sagittal · 4.0mm · 0.81mm/px · 3 of 18 slices shown]
[im 1/18]
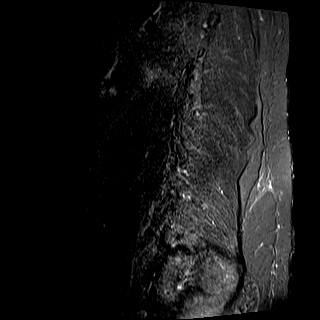
[im 6/18]
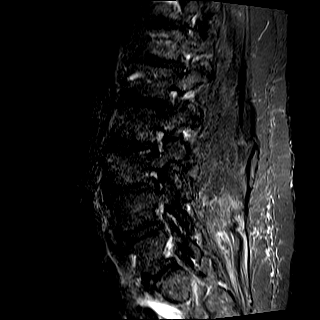
[im 12/18]
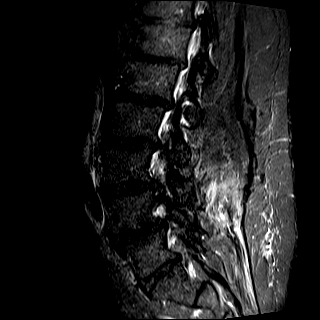

[Series 4: T2 · axial · 4.0mm · 0.62mm/px · z∈[+40,+165]mm · 5 of 26 slices shown (1 of 3)]
[im 1/26]
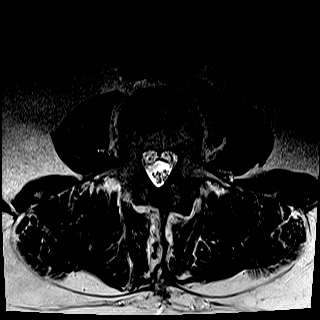
[im 7/26]
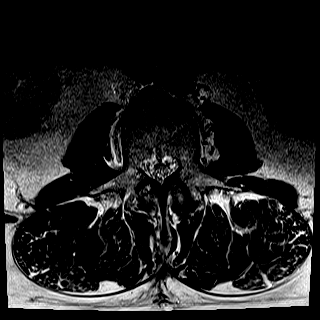
[im 13/26]
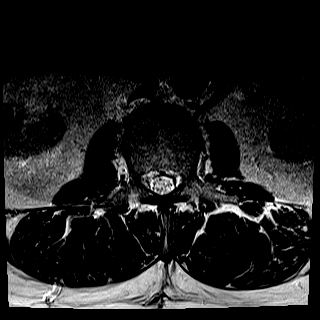
[im 19/26]
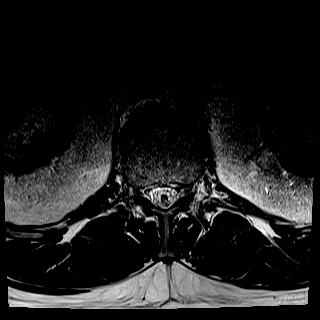
[im 26/26]
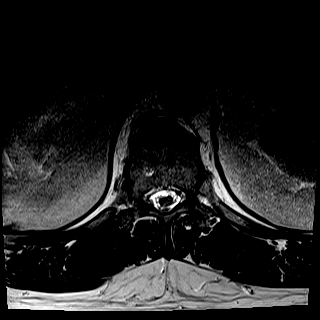

[Series 5: T2 · axial · 4.0mm · 0.62mm/px · z∈[-107,+23]mm · 5 of 28 slices shown (2 of 3)]
[im 1/28]
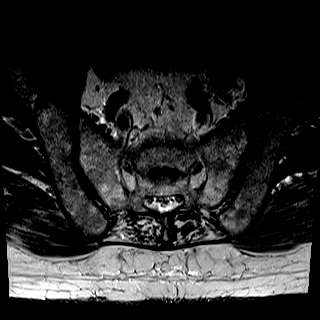
[im 7/28]
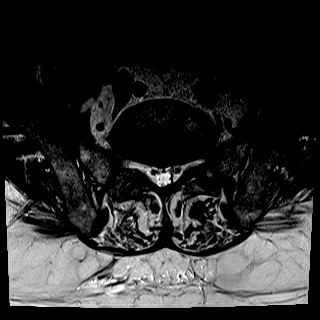
[im 14/28]
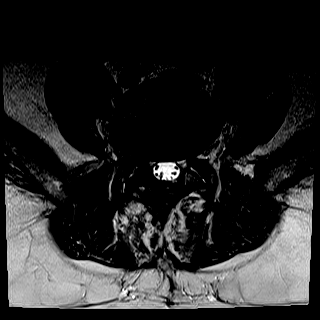
[im 21/28]
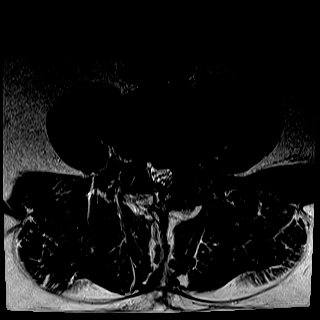
[im 28/28]
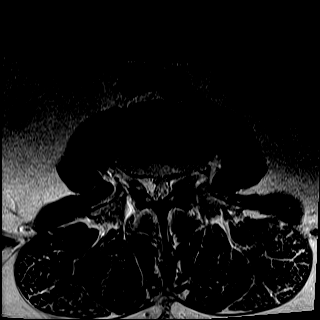

[Series 6: T1 · axial · 4.0mm · 0.62mm/px · z∈[+40,+165]mm · 5 of 26 slices shown (2 of 5)]
[im 1/26]
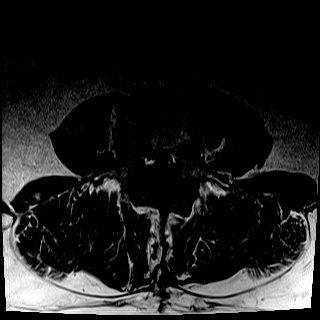
[im 7/26]
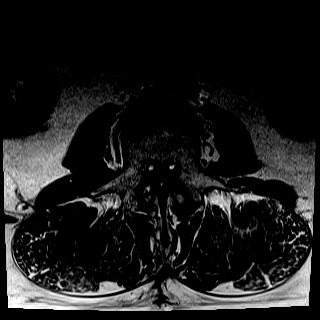
[im 13/26]
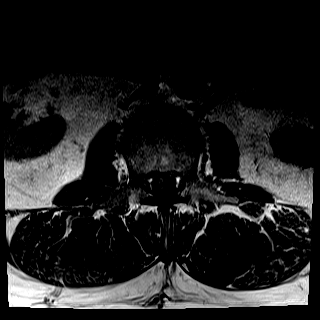
[im 19/26]
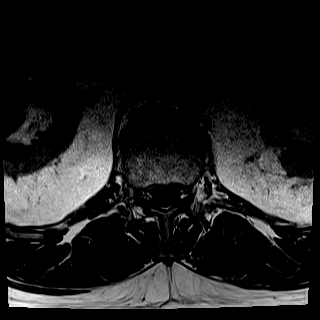
[im 26/26]
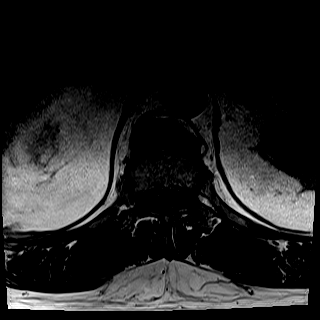

[Series 7: T1 · axial · 4.0mm · 0.62mm/px · z∈[-107,+23]mm · 5 of 28 slices shown (3 of 5)]
[im 1/28]
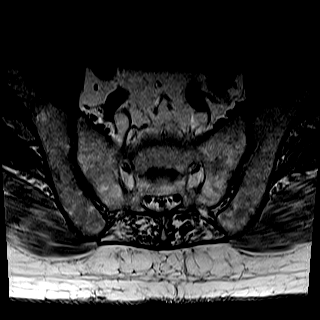
[im 7/28]
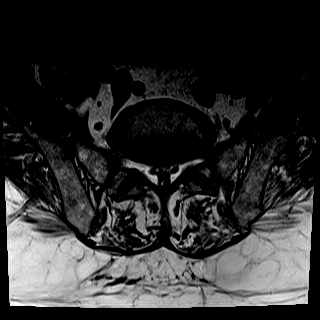
[im 14/28]
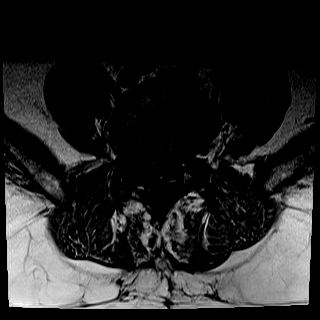
[im 21/28]
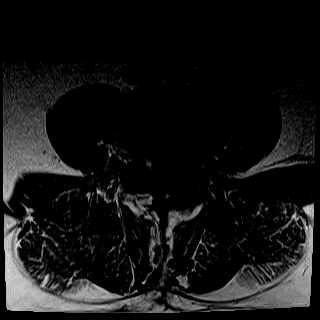
[im 28/28]
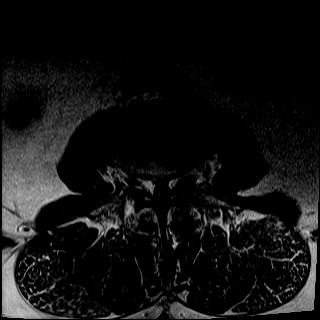

[Series 8: T2 · sagittal · 4.0mm · 0.81mm/px · 3 of 18 slices shown (3 of 3)]
[im 1/18]
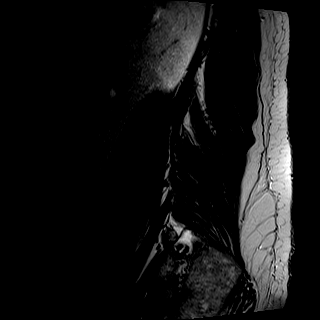
[im 9/18]
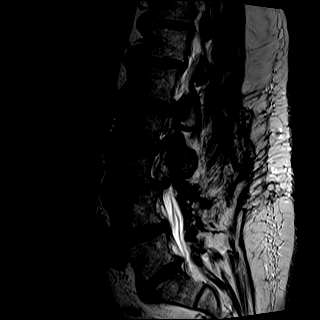
[im 18/18]
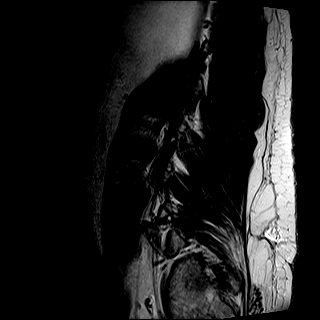

[Series 9: T1 fat-sat · sagittal · 4.0mm · 0.81mm/px · 3 of 18 slices shown]
[im 1/18]
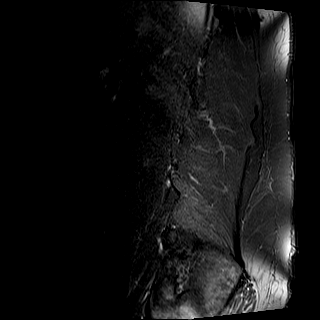
[im 9/18]
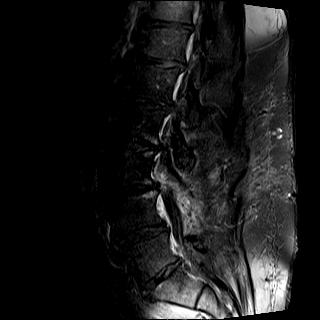
[im 18/18]
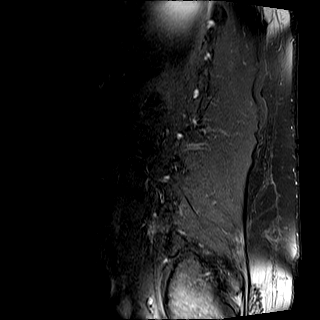

[Series 10: T1 · axial · 4.0mm · 0.62mm/px · z∈[+40,+165]mm · 5 of 26 slices shown (4 of 5)]
[im 1/26]
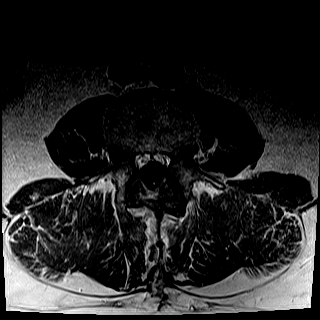
[im 7/26]
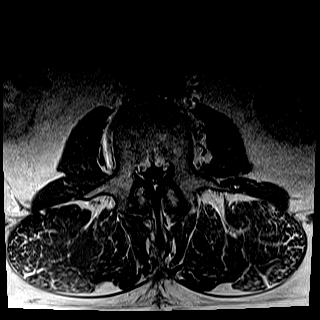
[im 13/26]
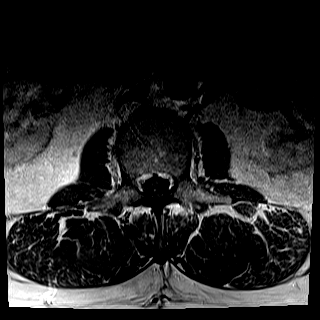
[im 19/26]
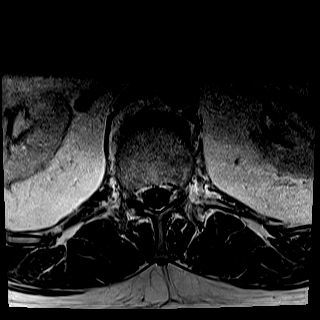
[im 26/26]
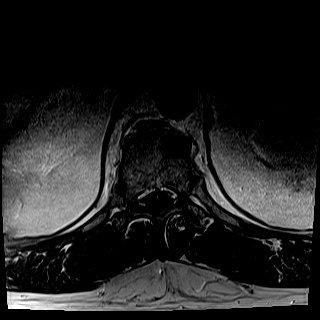

[Series 11: T1 · axial · 4.0mm · 0.62mm/px · z∈[-107,+23]mm · 5 of 28 slices shown (5 of 5)]
[im 1/28]
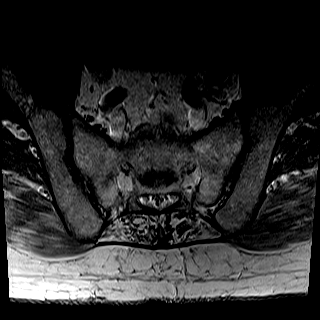
[im 7/28]
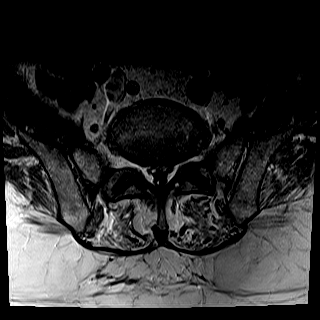
[im 14/28]
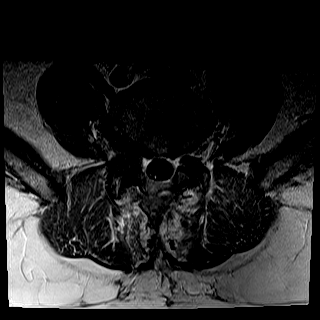
[im 21/28]
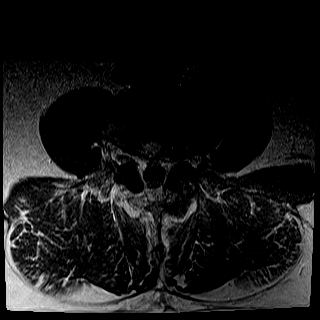
[im 28/28]
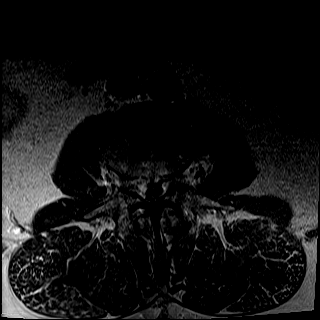

[43 of 48 positions shown; findings below may reference images not displayed]

FINDINGS: Segmentation: 12 paired ribs on 1763 chest x-ray. Six lumbar type
non-rib-bearing vertebrae. The lowest considered S1, which matches
prior.

Alignment: Grade 1 anterolisthesis at L3-4, chronic and facet
mediated

Vertebrae: Interval decompressive laminectomy from L2-3 to L5-S1. No
acute fracture, discitis, or aggressive bone lesion.

Conus medullaris and cauda equina: Conus extends to the T12-L1
level. Conus and cauda equina appear normal.

Paraspinal and other soft tissues: Postoperative distortion of
intrinsic back muscles. Negative for fluid collection

Disc levels:

T12- L1: Disc narrowing and protrusion with posterior element
hypertrophy. There is spinal stenosis that is moderate. Bilateral
moderate foraminal narrowing. No change

L1-L2: Disc narrowing and bulging. Noncompressive foraminal and
spinal canal stenosis.

L2-L3: Disc narrowing and circumferential bulging. Posterior element
hypertrophy. Noncompressive bilateral foraminal narrowing. Stable
moderate spinal stenosis

L3-L4: Disc narrowing and bulging with central and right foraminal
protrusion. Improved thecal sac patency after decompression,
although still stenotic. Left more than right foraminal narrowing.

L4-L5: Disc narrowing and bulging with a central protrusion.
Posterior element hypertrophy. Significantly improved thecal sac
patency after decompressive laminectomy. Left foraminal impingement
primarily due to disc bulging and height loss, progressed. Patent
right foramen

L5-S1:Improved thecal sac patency after decompression. Disc
narrowing and bulging with chronic right L5 foraminal compression.
The left foramen is patent

S1-2: Disc bulging and narrowing.  No impingement.
IMPRESSION: 1. Advanced degenerative disease with congenitally narrow canal.
2. Significantly improved thecal sac patency from L3-4 to L5-S1
after laminectomy. Maximal residual stenosis is at L3-4, moderate to
advanced.
3. T12-L1 chronic moderate spinal stenosis with disc contacting the
ventral conus.
4. L2-3 moderate spinal stenosis, unchanged
5. Multilevel foraminal narrowing with impingement greatest on the
left at L3-4, L4-5 and right at L5-S1.
6. Transitional S1 vertebra as noted above. The lowest open disc
space is numbered S1-2.

## 2019-09-30 ENCOUNTER — Other Ambulatory Visit: Payer: Self-pay | Admitting: Sports Medicine

## 2019-09-30 DIAGNOSIS — M19011 Primary osteoarthritis, right shoulder: Secondary | ICD-10-CM

## 2019-10-08 ENCOUNTER — Other Ambulatory Visit: Payer: Self-pay

## 2019-10-08 ENCOUNTER — Ambulatory Visit (INDEPENDENT_AMBULATORY_CARE_PROVIDER_SITE_OTHER): Payer: Medicare Other | Admitting: Sports Medicine

## 2019-10-08 ENCOUNTER — Other Ambulatory Visit: Payer: Self-pay | Admitting: Sports Medicine

## 2019-10-08 VITALS — BP 106/63 | HR 79 | Ht 74.0 in | Wt 266.0 lb

## 2019-10-08 DIAGNOSIS — M19011 Primary osteoarthritis, right shoulder: Secondary | ICD-10-CM

## 2019-10-08 DIAGNOSIS — I480 Paroxysmal atrial fibrillation: Secondary | ICD-10-CM | POA: Diagnosis not present

## 2019-10-08 DIAGNOSIS — Z86711 Personal history of pulmonary embolism: Secondary | ICD-10-CM

## 2019-10-08 LAB — POCT INR: INR: 3.4 — AB (ref 2.0–3.0)

## 2019-10-08 NOTE — Progress Notes (Signed)
Patient reports taking Coumadin as follows:  7.5 mg - 1 tablet daily, 1/2 tablet on Wednesdays only.    Jeff Green denies any missed doses of medication, extra doses, nose bleeds, antibiotic use, hospitalization, blood in urine, blood in stool, dental procedures, or medication changes.  He does continue to complain of easy bruising and bleeding gums. He was advised per Dr. Dianah Field to see his dentist. It has been over a year since his last dental cleaning and patient will get this scheduled.   INR was checked today with a reading of 3.4. Patient's goal is 2.5-3.5 per oncology. Patient will continue current dosing and follow up in two weeks for repeat INR check. He has visit already scheduled with Dr. Dianah Field.

## 2019-10-08 NOTE — Assessment & Plan Note (Signed)
He is still reporting some bleeding gums and bruising, however INR is therapeutic, he will seek dental evaluation. Coumadin dosing is 7.5 mg - one half tab on Wednesday, 1 tab other days. Recheck in 2 weeks.

## 2019-10-12 DIAGNOSIS — M545 Low back pain: Secondary | ICD-10-CM | POA: Diagnosis not present

## 2019-10-12 DIAGNOSIS — I2782 Chronic pulmonary embolism: Secondary | ICD-10-CM | POA: Diagnosis not present

## 2019-10-12 DIAGNOSIS — Z888 Allergy status to other drugs, medicaments and biological substances status: Secondary | ICD-10-CM | POA: Diagnosis not present

## 2019-10-12 DIAGNOSIS — I10 Essential (primary) hypertension: Secondary | ICD-10-CM | POA: Diagnosis not present

## 2019-10-12 DIAGNOSIS — Z7901 Long term (current) use of anticoagulants: Secondary | ICD-10-CM | POA: Diagnosis not present

## 2019-10-12 DIAGNOSIS — Z955 Presence of coronary angioplasty implant and graft: Secondary | ICD-10-CM | POA: Diagnosis not present

## 2019-10-12 DIAGNOSIS — I2699 Other pulmonary embolism without acute cor pulmonale: Secondary | ICD-10-CM | POA: Diagnosis not present

## 2019-10-12 DIAGNOSIS — Z95828 Presence of other vascular implants and grafts: Secondary | ICD-10-CM | POA: Diagnosis not present

## 2019-10-12 DIAGNOSIS — E119 Type 2 diabetes mellitus without complications: Secondary | ICD-10-CM | POA: Diagnosis not present

## 2019-10-12 DIAGNOSIS — Z7984 Long term (current) use of oral hypoglycemic drugs: Secondary | ICD-10-CM | POA: Diagnosis not present

## 2019-10-12 DIAGNOSIS — G8929 Other chronic pain: Secondary | ICD-10-CM | POA: Diagnosis not present

## 2019-10-12 DIAGNOSIS — Z79890 Hormone replacement therapy (postmenopausal): Secondary | ICD-10-CM | POA: Diagnosis not present

## 2019-10-12 DIAGNOSIS — I82492 Acute embolism and thrombosis of other specified deep vein of left lower extremity: Secondary | ICD-10-CM | POA: Diagnosis not present

## 2019-10-12 DIAGNOSIS — I2510 Atherosclerotic heart disease of native coronary artery without angina pectoris: Secondary | ICD-10-CM | POA: Diagnosis not present

## 2019-10-12 DIAGNOSIS — Z87891 Personal history of nicotine dependence: Secondary | ICD-10-CM | POA: Diagnosis not present

## 2019-10-12 DIAGNOSIS — J302 Other seasonal allergic rhinitis: Secondary | ICD-10-CM | POA: Diagnosis not present

## 2019-10-12 DIAGNOSIS — Z79899 Other long term (current) drug therapy: Secondary | ICD-10-CM | POA: Diagnosis not present

## 2019-10-12 DIAGNOSIS — E7800 Pure hypercholesterolemia, unspecified: Secondary | ICD-10-CM | POA: Diagnosis not present

## 2019-10-12 DIAGNOSIS — I482 Chronic atrial fibrillation, unspecified: Secondary | ICD-10-CM | POA: Diagnosis not present

## 2019-10-15 ENCOUNTER — Ambulatory Visit (INDEPENDENT_AMBULATORY_CARE_PROVIDER_SITE_OTHER): Payer: Medicare Other | Admitting: Sports Medicine

## 2019-10-15 DIAGNOSIS — I82432 Acute embolism and thrombosis of left popliteal vein: Secondary | ICD-10-CM | POA: Diagnosis not present

## 2019-10-15 DIAGNOSIS — Z86711 Personal history of pulmonary embolism: Secondary | ICD-10-CM | POA: Diagnosis not present

## 2019-10-15 DIAGNOSIS — M48061 Spinal stenosis, lumbar region without neurogenic claudication: Secondary | ICD-10-CM | POA: Diagnosis not present

## 2019-10-15 MED ORDER — KETOROLAC TROMETHAMINE 30 MG/ML IJ SOLN
30.0000 mg | Freq: Once | INTRAMUSCULAR | Status: AC
  Administered 2019-10-15: 30 mg via INTRAMUSCULAR

## 2019-10-15 MED ORDER — HYDROCODONE-ACETAMINOPHEN 5-325 MG PO TABS: 1.0000 | ORAL_TABLET | Freq: Two times a day (BID) | ORAL | 0 refills | Status: DC | PRN

## 2019-10-15 NOTE — Progress Notes (Signed)
    Procedures performed today:    None.  Independent interpretation of notes and tests performed by another provider:   None.  Brief History, Exam, Impression, and Recommendations:    DVT (deep venous thrombosis) (HCC) Jeff Green has a known chronic left popliteal vein DVT, he has been on Coumadin, with an INR goal of 2.5-3.5. Last week he felt some pain in his left leg, it radiated from his left posterior lateral calf up to his buttock. He was concerned about a DVT so he went to the hospital. Ultrasound showed left popliteal DVT, this is similar to what we saw just over a year ago, and I think this was simply a chronic popliteal DVT. He was continued on his current anticoagulants and referred to hematology. I do suspect the pain he was feeling was more likely radicular. He is requesting referral to vascular surgery, I think this is entirely appropriate with his chronic DVT.  History of pulmonary embolism Jeff Green has a known left lower lobe pulmonary embolism diagnosed in [place] back in 2013, he has been on Coumadin, with INR goal of 2.5-3.5. He was seen in the ED for left leg pain, I do suspect the popliteal DVT is old, a similar DVT was present in an ultrasound that we performed approximately a year ago. He also had a CT angiogram at his most recent hospitalization, that also showed a chronic appearing left lower lobe pulmonary embolus. I do not think he has any new emboli and that his left leg pain was more likely radicular. Nonetheless he will be touching base with hematology.  Spinal stenosis of lumbar region Jeff Green does have a distant history of an L2-L5 lumbar decompression, he has residual stenosis, moderate to severe at L3-L4. He should be discussing this with his neurosurgeon. He did have an episode of left leg pain, radiating from posterior lateral calf to buttock. This prompted an ED visit, that revealed a left popliteal DVT that was present on an ultrasound a year ago,  as well as a chronic appearing left lower lobe pulmonary embolus, likely chronic from when he sustained in [place] in 2013. I think the majority of his symptoms are still related to his radicular process. Tramadol is not effective, I am going to start him on low-dose hydrocodone for use up to twice a day with a pain contract. He declines referral to pain management, has had bad experiences there before. I think he is likely to get good relief and good functional improvement with a long-term narcotic. He is currently taking amitriptyline and gabapentin. Epidural certainly could be an option in the future but could be somewhat problematic based on needing to stop his Coumadin, and then get him back to a therapeutic range.  I spent 40 minutes of total time managing this patient today, this includes chart review, face to face, and non-face to face time.   ___________________________________________ Gwen Her. Dianah Field, M.D., ABFM., CAQSM. Primary Care and [place] Instructor of [place] of [facility] of Medicine

## 2019-10-15 NOTE — Assessment & Plan Note (Signed)
Jeff Green has a known left lower lobe pulmonary embolism diagnosed in [place] back in 2013, he has been on Coumadin, with INR goal of 2.5-3.5. He was seen in the ED for left leg pain, I do suspect the popliteal DVT is old, a similar DVT was present in an ultrasound that we performed approximately a year ago. He also had a CT angiogram at his most recent hospitalization, that also showed a chronic appearing left lower lobe pulmonary embolus. I do not think he has any new emboli and that his left leg pain was more likely radicular. Nonetheless he will be touching base with hematology.

## 2019-10-15 NOTE — Assessment & Plan Note (Signed)
Harvin does have a distant history of an L2-L5 lumbar decompression, he has residual stenosis, moderate to severe at L3-L4. He should be discussing this with his neurosurgeon. He did have an episode of left leg pain, radiating from posterior lateral calf to buttock. This prompted an ED visit, that revealed a left popliteal DVT that was present on an ultrasound a year ago, as well as a chronic appearing left lower lobe pulmonary embolus, likely chronic from when he sustained in [place] in 2013. I think the majority of his symptoms are still related to his radicular process. Tramadol is not effective, I am going to start him on low-dose hydrocodone for use up to twice a day with a pain contract. He declines referral to pain management, has had bad experiences there before. I think he is likely to get good relief and good functional improvement with a long-term narcotic. He is currently taking amitriptyline and gabapentin. Epidural certainly could be an option in the future but could be somewhat problematic based on needing to stop his Coumadin, and then get him back to a therapeutic range.

## 2019-10-15 NOTE — Assessment & Plan Note (Addendum)
Jeff Green has a known chronic left popliteal vein DVT, he has been on Coumadin, with an INR goal of 2.5-3.5. Last week he felt some pain in his left leg, it radiated from his left posterior lateral calf up to his buttock. He was concerned about a DVT so he went to the hospital. Ultrasound showed left popliteal DVT, this is similar to what we saw just over a year ago, and I think this was simply a chronic popliteal DVT. He was continued on his current anticoagulants and referred to hematology. I do suspect the pain he was feeling was more likely radicular. He is requesting referral to vascular surgery, I think this is entirely appropriate with his chronic DVT.

## 2019-10-22 ENCOUNTER — Other Ambulatory Visit: Payer: Self-pay

## 2019-10-22 ENCOUNTER — Ambulatory Visit (INDEPENDENT_AMBULATORY_CARE_PROVIDER_SITE_OTHER): Payer: Medicare Other | Admitting: Sports Medicine

## 2019-10-22 ENCOUNTER — Encounter: Payer: Self-pay | Admitting: Sports Medicine

## 2019-10-22 VITALS — BP 154/83 | HR 88 | Ht 74.0 in | Wt 267.0 lb

## 2019-10-22 DIAGNOSIS — M48061 Spinal stenosis, lumbar region without neurogenic claudication: Secondary | ICD-10-CM | POA: Diagnosis not present

## 2019-10-22 DIAGNOSIS — M19012 Primary osteoarthritis, left shoulder: Secondary | ICD-10-CM | POA: Diagnosis not present

## 2019-10-22 DIAGNOSIS — M19011 Primary osteoarthritis, right shoulder: Secondary | ICD-10-CM | POA: Diagnosis not present

## 2019-10-22 DIAGNOSIS — I480 Paroxysmal atrial fibrillation: Secondary | ICD-10-CM | POA: Diagnosis not present

## 2019-10-22 DIAGNOSIS — Z86711 Personal history of pulmonary embolism: Secondary | ICD-10-CM

## 2019-10-22 LAB — POCT INR: INR: 3.8 — AB (ref 2.0–3.0)

## 2019-10-22 MED ORDER — WARFARIN SODIUM 7.5 MG PO TABS: 7.5000 mg | ORAL_TABLET | Freq: Every day | ORAL | 1 refills | Status: DC

## 2019-10-22 MED ORDER — TRULICITY 3 MG/0.5ML ~~LOC~~ SOAJ: 3.0000 mg | SUBCUTANEOUS | 0 refills | Status: DC

## 2019-10-22 MED ORDER — PREDNISONE 50 MG PO TABS: ORAL_TABLET | ORAL | 0 refills | Status: DC

## 2019-10-22 NOTE — Assessment & Plan Note (Signed)
INR slightly supratherapeutic, no bleeding, he can eat some greens. Coumadin dosing is 7.5 mg - one half tab on Wednesday, 1 tab other days. Recheck at next office visit in 4 to 6 weeks.

## 2019-10-22 NOTE — Assessment & Plan Note (Signed)
Distant history of L2-L5 lumbar decompression, residual stenosis, which is moderate to severe at the L3-L4 level. There was an episode of left leg pain, radiating from buttock down to plantar foot. This had prompted an ED visit that revealed a left popliteal DVT, this is old and was present on an ultrasound a year ago. There is also a chronic appearing left lower lobe pulmonary embolus, also chronic from an old PE in 2013. Symptoms are likely related to his radicular process, we did add some hydrocodone at the last visit without much efficacy. Continue with amitriptyline and gabapentin. I am going to add a burst of prednisone and if this fails we will proceed with stopping Coumadin, waiting until INR drops to 1, and then proceeding with an epidural.

## 2019-10-22 NOTE — Assessment & Plan Note (Signed)
Bilateral glenohumeral injections today, increasing pain. Not interested in pain medication or surgery.

## 2019-10-22 NOTE — Progress Notes (Signed)
° ° °  Procedures performed today:    Procedure: Real-time Ultrasound Guided injection of the left glenohumeral joint  Device: Samsung HS60  Verbal informed consent obtained.  Time-out conducted.  Noted no overlying erythema, induration, or other signs of local infection.  Skin prepped in a sterile fashion.  Local anesthesia: Topical Ethyl chloride.  With sterile technique and under real time ultrasound guidance: 1 cc Kenalog 40, 2 cc lidocaine, 2 cc bupivacaine injected easily Completed without difficulty  Pain immediately resolved suggesting accurate placement of the medication.  Advised to call if fevers/chills, erythema, induration, drainage, or persistent bleeding.  Images permanently stored and available for review in the ultrasound unit.  Impression: Technically successful ultrasound guided injection.  Procedure: Real-time Ultrasound Guided injection of the right glenohumeral joint Device: Samsung HS60  Verbal informed consent obtained.  Time-out conducted.  Noted no overlying erythema, induration, or other signs of local infection.  Skin prepped in a sterile fashion.  Local anesthesia: Topical Ethyl chloride.  With sterile technique and under real time ultrasound guidance: 1 cc Kenalog 40, 2 cc lidocaine, 2 cc bupivacaine injected easily Completed without difficulty  Pain immediately resolved suggesting accurate placement of the medication.  Advised to call if fevers/chills, erythema, induration, drainage, or persistent bleeding.  Images permanently stored and available for review in the ultrasound unit.  Impression: Technically successful ultrasound guided injection.  Independent interpretation of notes and tests performed by another provider:   None.  Brief History, Exam, Impression, and Recommendations:    Morbid obesity ([place]) Ahmed initially all some weight on Trulicity, he has been fairly stable at the new 1.5 mg dose so we will go up to 3 mg. Recheck in 6  weeks.  Primary osteoarthritis of both shoulders Bilateral glenohumeral injections today, increasing pain. Not interested in pain medication or surgery.  Spinal stenosis of lumbar region Distant history of L2-L5 lumbar decompression, residual stenosis, which is moderate to severe at the L3-L4 level. There was an episode of left leg pain, radiating from buttock down to plantar foot. This had prompted an ED visit that revealed a left popliteal DVT, this is old and was present on an ultrasound a year ago. There is also a chronic appearing left lower lobe pulmonary embolus, also chronic from an old PE in 2013. Symptoms are likely related to his radicular process, we did add some hydrocodone at the last visit without much efficacy. Continue with amitriptyline and gabapentin. I am going to add a burst of prednisone and if this fails we will proceed with stopping Coumadin, waiting until INR drops to 1, and then proceeding with an epidural.   Paroxysmal atrial fibrillation (HCC) INR slightly supratherapeutic, no bleeding, he can eat some greens. Coumadin dosing is 7.5 mg - one half tab on Wednesday, 1 tab other days. Recheck at next office visit in 4 to 6 weeks.  I spent 40 minutes of total time managing this patient today, this includes chart review, face to face, and non-face to face time, this was separate from the time spent performing the above procedure   ___________________________________________ Gwen Her. Dianah Field, M.D., ABFM., CAQSM. Primary Care and [place] Instructor of [place] of [facility] of Medicine

## 2019-10-22 NOTE — Assessment & Plan Note (Signed)
Bradey initially all some weight on Trulicity, he has been fairly stable at the new 1.5 mg dose so we will go up to 3 mg. Recheck in 6 weeks.

## 2019-10-27 ENCOUNTER — Telehealth: Payer: Self-pay | Admitting: *Deleted

## 2019-10-27 DIAGNOSIS — I82432 Acute embolism and thrombosis of left popliteal vein: Secondary | ICD-10-CM

## 2019-10-27 NOTE — Telephone Encounter (Signed)
Changed the order priority, but they really could have just taken a verbal order for this.

## 2019-10-27 NOTE — Telephone Encounter (Signed)
Pt left vm today.  He spoke with the vascular center and they advised him that they only way they can get him scheduled and seen soon is if you change the order to urgent or emergent.  He stated that he is "in a world of pain" with his leg.

## 2019-10-28 DIAGNOSIS — I2782 Chronic pulmonary embolism: Secondary | ICD-10-CM | POA: Diagnosis not present

## 2019-10-28 DIAGNOSIS — I825Z2 Chronic embolism and thrombosis of unspecified deep veins of left distal lower extremity: Secondary | ICD-10-CM | POA: Diagnosis not present

## 2019-10-28 DIAGNOSIS — Z79899 Other long term (current) drug therapy: Secondary | ICD-10-CM | POA: Diagnosis not present

## 2019-10-28 DIAGNOSIS — I824Z2 Acute embolism and thrombosis of unspecified deep veins of left distal lower extremity: Secondary | ICD-10-CM | POA: Diagnosis not present

## 2019-10-28 DIAGNOSIS — Z87891 Personal history of nicotine dependence: Secondary | ICD-10-CM | POA: Diagnosis not present

## 2019-10-28 DIAGNOSIS — Z7901 Long term (current) use of anticoagulants: Secondary | ICD-10-CM | POA: Diagnosis not present

## 2019-10-28 NOTE — Telephone Encounter (Signed)
[facility] notifying pt of new urgent referral.

## 2019-11-04 ENCOUNTER — Telehealth: Payer: Self-pay | Admitting: Sports Medicine

## 2019-11-04 MED ORDER — DICLOFENAC SODIUM 1 % EX GEL: 4.0000 g | Freq: Four times a day (QID) | CUTANEOUS | 11 refills | Status: DC

## 2019-11-04 NOTE — Telephone Encounter (Signed)
Pt states the Hospital gave him Voltaren and he needs a refill sent in to [facility] because he is about out

## 2019-11-04 NOTE — Telephone Encounter (Signed)
Done

## 2019-11-05 NOTE — Telephone Encounter (Signed)
Left a detailed vm msg for pt regarding rx refill sent to local pharmacy. Direct call back info provided.

## 2019-11-11 ENCOUNTER — Other Ambulatory Visit: Payer: Self-pay

## 2019-11-11 DIAGNOSIS — M48061 Spinal stenosis, lumbar region without neurogenic claudication: Secondary | ICD-10-CM

## 2019-11-11 MED ORDER — GABAPENTIN 300 MG PO CAPS: 300.0000 mg | ORAL_CAPSULE | Freq: Three times a day (TID) | ORAL | 3 refills | Status: DC

## 2019-11-11 MED ORDER — HYDROCODONE-ACETAMINOPHEN 5-325 MG PO TABS: 1.0000 | ORAL_TABLET | Freq: Two times a day (BID) | ORAL | 0 refills | Status: DC | PRN

## 2019-11-11 NOTE — Telephone Encounter (Signed)
Meds refilled, he already saw the hematologist earlier this month.

## 2019-11-11 NOTE — Telephone Encounter (Signed)
Rich states he is ok with the referral to Hematology, per imaging results. He also would like a refill on Hydrocodone and Gabapentin.

## 2019-11-12 NOTE — Telephone Encounter (Signed)
Patient advised.

## 2019-11-16 ENCOUNTER — Other Ambulatory Visit: Payer: Self-pay

## 2019-11-16 ENCOUNTER — Encounter: Payer: Self-pay | Admitting: Sports Medicine

## 2019-11-16 ENCOUNTER — Ambulatory Visit (INDEPENDENT_AMBULATORY_CARE_PROVIDER_SITE_OTHER): Payer: Medicare Other | Admitting: Sports Medicine

## 2019-11-16 DIAGNOSIS — I480 Paroxysmal atrial fibrillation: Secondary | ICD-10-CM

## 2019-11-16 DIAGNOSIS — I82432 Acute embolism and thrombosis of left popliteal vein: Secondary | ICD-10-CM

## 2019-11-16 DIAGNOSIS — M48061 Spinal stenosis, lumbar region without neurogenic claudication: Secondary | ICD-10-CM

## 2019-11-16 MED ORDER — WARFARIN SODIUM 7.5 MG PO TABS: 7.5000 mg | ORAL_TABLET | Freq: Every day | ORAL | 1 refills | Status: DC

## 2019-11-16 MED ORDER — HYDROCODONE-ACETAMINOPHEN 10-325 MG PO TABS: 1.0000 | ORAL_TABLET | Freq: Two times a day (BID) | ORAL | 0 refills | Status: DC | PRN

## 2019-11-16 NOTE — Assessment & Plan Note (Signed)
Jeff Green has a known chronic left popliteal vein DVT, he has been on Coumadin with an INR goal of 2.5-3.5 for years now. He started to have some pain going from his left posterior lateral calf up to his buttock, ultrasound showed persistence of the left chronic popliteal DVT, similar to what was seen approximately a year prior, and similar to what was diagnosed in [place] many years ago. He does have some tenderness over his posterior lateral calf, we tried initially referral to [facility] health vascular specialist, he would like me to do another referral to [facility] in [facility] to see if they can help. If suspected that this is not related to his DVT then we could certainly consider further evaluation of his lumbar spinal stenosis.

## 2019-11-16 NOTE — Assessment & Plan Note (Signed)
Jeff Green does have a distant history of a lumbar decompression, he did have residual stenosis at the L2-L3 level, L3-L4 level. I am not entirely sure as to whether his left lower extremity pain is coming from his back or from his DVT though I tend to favor that it is coming from his spinal stenosis. Before we proceed with any other intervention in his lumbar spine (epidurals have been effective in the past) I would like him to touch base with [facility] vascular specialists.

## 2019-11-16 NOTE — Progress Notes (Signed)
    Procedures performed today:    None.  Independent interpretation of notes and tests performed by another provider:   None.  Brief History, Exam, Impression, and Recommendations:    Jeff Green is a 64yo male who is presenting with complaints of continued right calf pain. He has had a persistent DVT diagnosed previously that is currently stable. The pain runs down his calf and shin with some numbness and tingling. Previous MRI demonstrated spinal stenosis at the the L2-L3 level which is most likely causing his pain. He has had epidurals and lumber decompression at other spinal levels in the past that have been effective. He has concerns that the pain could be coming from his DVT. He has seen vascular surgery in the past who suggested intervention. He will follow up with vascular surgery for intervention planning. We are going to prescribe hydrocodone to manage the pain while he is on a trip to [place] over the next 15 days. If vascular intervention does not help with the pain, we will discuss moving forward with lumbar decompression at L2-L3.    ___________________________________________ Gwen Her. Dianah Field, M.D., ABFM., CAQSM. Primary Care and [place] Instructor of [place] of [facility] of Medicine

## 2019-12-03 ENCOUNTER — Ambulatory Visit: Payer: Medicare Other | Admitting: Sports Medicine

## 2019-12-03 ENCOUNTER — Other Ambulatory Visit: Payer: Self-pay | Admitting: Sports Medicine

## 2019-12-08 ENCOUNTER — Other Ambulatory Visit: Payer: Self-pay | Admitting: Sports Medicine

## 2019-12-08 DIAGNOSIS — E785 Hyperlipidemia, unspecified: Secondary | ICD-10-CM

## 2019-12-30 DIAGNOSIS — Z20822 Contact with and (suspected) exposure to covid-19: Secondary | ICD-10-CM | POA: Diagnosis not present

## 2020-01-05 ENCOUNTER — Other Ambulatory Visit: Payer: Self-pay | Admitting: Sports Medicine

## 2020-01-05 MED ORDER — TRULICITY 3 MG/0.5ML ~~LOC~~ SOAJ: 3.0000 mg | SUBCUTANEOUS | 3 refills | Status: DC

## 2020-01-06 DIAGNOSIS — Z743 Need for continuous supervision: Secondary | ICD-10-CM | POA: Diagnosis not present

## 2020-01-06 DIAGNOSIS — E119 Type 2 diabetes mellitus without complications: Secondary | ICD-10-CM | POA: Diagnosis not present

## 2020-01-06 DIAGNOSIS — I4892 Unspecified atrial flutter: Secondary | ICD-10-CM | POA: Diagnosis not present

## 2020-01-06 DIAGNOSIS — I2510 Atherosclerotic heart disease of native coronary artery without angina pectoris: Secondary | ICD-10-CM | POA: Diagnosis not present

## 2020-01-06 DIAGNOSIS — I82502 Chronic embolism and thrombosis of unspecified deep veins of left lower extremity: Secondary | ICD-10-CM | POA: Diagnosis present

## 2020-01-06 DIAGNOSIS — I2782 Chronic pulmonary embolism: Secondary | ICD-10-CM | POA: Diagnosis not present

## 2020-01-06 DIAGNOSIS — Z8547 Personal history of malignant neoplasm of testis: Secondary | ICD-10-CM | POA: Diagnosis not present

## 2020-01-06 DIAGNOSIS — E1122 Type 2 diabetes mellitus with diabetic chronic kidney disease: Secondary | ICD-10-CM | POA: Diagnosis not present

## 2020-01-06 DIAGNOSIS — J9601 Acute respiratory failure with hypoxia: Secondary | ICD-10-CM | POA: Diagnosis not present

## 2020-01-06 DIAGNOSIS — Z7901 Long term (current) use of anticoagulants: Secondary | ICD-10-CM | POA: Diagnosis not present

## 2020-01-06 DIAGNOSIS — G8929 Other chronic pain: Secondary | ICD-10-CM | POA: Diagnosis present

## 2020-01-06 DIAGNOSIS — J9621 Acute and chronic respiratory failure with hypoxia: Secondary | ICD-10-CM | POA: Diagnosis not present

## 2020-01-06 DIAGNOSIS — Z9889 Other specified postprocedural states: Secondary | ICD-10-CM | POA: Diagnosis not present

## 2020-01-06 DIAGNOSIS — J690 Pneumonitis due to inhalation of food and vomit: Secondary | ICD-10-CM | POA: Diagnosis present

## 2020-01-06 DIAGNOSIS — I129 Hypertensive chronic kidney disease with stage 1 through stage 4 chronic kidney disease, or unspecified chronic kidney disease: Secondary | ICD-10-CM | POA: Diagnosis not present

## 2020-01-06 DIAGNOSIS — Z9221 Personal history of antineoplastic chemotherapy: Secondary | ICD-10-CM | POA: Diagnosis not present

## 2020-01-06 DIAGNOSIS — E785 Hyperlipidemia, unspecified: Secondary | ICD-10-CM | POA: Diagnosis not present

## 2020-01-06 DIAGNOSIS — R5381 Other malaise: Secondary | ICD-10-CM | POA: Diagnosis present

## 2020-01-06 DIAGNOSIS — I4891 Unspecified atrial fibrillation: Secondary | ICD-10-CM | POA: Diagnosis not present

## 2020-01-06 DIAGNOSIS — I2699 Other pulmonary embolism without acute cor pulmonale: Secondary | ICD-10-CM | POA: Diagnosis present

## 2020-01-06 DIAGNOSIS — R069 Unspecified abnormalities of breathing: Secondary | ICD-10-CM | POA: Diagnosis not present

## 2020-01-06 DIAGNOSIS — R279 Unspecified lack of coordination: Secondary | ICD-10-CM | POA: Diagnosis not present

## 2020-01-06 DIAGNOSIS — J1282 Pneumonia due to coronavirus disease 2019: Secondary | ICD-10-CM | POA: Diagnosis not present

## 2020-01-06 DIAGNOSIS — J129 Viral pneumonia, unspecified: Secondary | ICD-10-CM | POA: Diagnosis not present

## 2020-01-06 DIAGNOSIS — R0902 Hypoxemia: Secondary | ICD-10-CM | POA: Diagnosis not present

## 2020-01-06 DIAGNOSIS — I482 Chronic atrial fibrillation, unspecified: Secondary | ICD-10-CM | POA: Diagnosis not present

## 2020-01-06 DIAGNOSIS — N1832 Chronic kidney disease, stage 3b: Secondary | ICD-10-CM | POA: Diagnosis not present

## 2020-01-06 DIAGNOSIS — J189 Pneumonia, unspecified organism: Secondary | ICD-10-CM | POA: Diagnosis not present

## 2020-01-06 DIAGNOSIS — I517 Cardiomegaly: Secondary | ICD-10-CM | POA: Diagnosis not present

## 2020-01-06 DIAGNOSIS — I825Z2 Chronic embolism and thrombosis of unspecified deep veins of left distal lower extremity: Secondary | ICD-10-CM | POA: Diagnosis not present

## 2020-01-06 DIAGNOSIS — Z23 Encounter for immunization: Secondary | ICD-10-CM | POA: Diagnosis not present

## 2020-01-06 DIAGNOSIS — R0602 Shortness of breath: Secondary | ICD-10-CM | POA: Diagnosis not present

## 2020-01-06 DIAGNOSIS — R0689 Other abnormalities of breathing: Secondary | ICD-10-CM | POA: Diagnosis not present

## 2020-01-06 DIAGNOSIS — M5136 Other intervertebral disc degeneration, lumbar region: Secondary | ICD-10-CM | POA: Diagnosis not present

## 2020-01-06 DIAGNOSIS — I10 Essential (primary) hypertension: Secondary | ICD-10-CM | POA: Diagnosis not present

## 2020-01-06 DIAGNOSIS — M5440 Lumbago with sciatica, unspecified side: Secondary | ICD-10-CM | POA: Diagnosis present

## 2020-01-06 DIAGNOSIS — U071 COVID-19: Secondary | ICD-10-CM | POA: Diagnosis not present

## 2020-01-06 DIAGNOSIS — G4733 Obstructive sleep apnea (adult) (pediatric): Secondary | ICD-10-CM | POA: Diagnosis not present

## 2020-01-06 DIAGNOSIS — Z95828 Presence of other vascular implants and grafts: Secondary | ICD-10-CM | POA: Diagnosis not present

## 2020-01-07 DIAGNOSIS — N1832 Chronic kidney disease, stage 3b: Secondary | ICD-10-CM | POA: Diagnosis not present

## 2020-01-07 DIAGNOSIS — I482 Chronic atrial fibrillation, unspecified: Secondary | ICD-10-CM | POA: Diagnosis not present

## 2020-01-07 DIAGNOSIS — E119 Type 2 diabetes mellitus without complications: Secondary | ICD-10-CM | POA: Diagnosis not present

## 2020-01-07 DIAGNOSIS — I2782 Chronic pulmonary embolism: Secondary | ICD-10-CM | POA: Diagnosis not present

## 2020-01-07 DIAGNOSIS — Z7901 Long term (current) use of anticoagulants: Secondary | ICD-10-CM | POA: Diagnosis not present

## 2020-01-07 DIAGNOSIS — G4733 Obstructive sleep apnea (adult) (pediatric): Secondary | ICD-10-CM | POA: Diagnosis not present

## 2020-01-07 DIAGNOSIS — J9621 Acute and chronic respiratory failure with hypoxia: Secondary | ICD-10-CM | POA: Diagnosis not present

## 2020-01-07 DIAGNOSIS — I10 Essential (primary) hypertension: Secondary | ICD-10-CM | POA: Diagnosis not present

## 2020-01-07 DIAGNOSIS — I825Z2 Chronic embolism and thrombosis of unspecified deep veins of left distal lower extremity: Secondary | ICD-10-CM | POA: Diagnosis not present

## 2020-01-08 DIAGNOSIS — G4733 Obstructive sleep apnea (adult) (pediatric): Secondary | ICD-10-CM | POA: Diagnosis not present

## 2020-01-08 DIAGNOSIS — I10 Essential (primary) hypertension: Secondary | ICD-10-CM | POA: Diagnosis not present

## 2020-01-08 DIAGNOSIS — E119 Type 2 diabetes mellitus without complications: Secondary | ICD-10-CM | POA: Diagnosis not present

## 2020-01-08 DIAGNOSIS — I482 Chronic atrial fibrillation, unspecified: Secondary | ICD-10-CM | POA: Diagnosis not present

## 2020-01-08 DIAGNOSIS — I825Z2 Chronic embolism and thrombosis of unspecified deep veins of left distal lower extremity: Secondary | ICD-10-CM | POA: Diagnosis not present

## 2020-01-08 DIAGNOSIS — Z7901 Long term (current) use of anticoagulants: Secondary | ICD-10-CM | POA: Diagnosis not present

## 2020-01-08 DIAGNOSIS — N1832 Chronic kidney disease, stage 3b: Secondary | ICD-10-CM | POA: Diagnosis not present

## 2020-01-08 DIAGNOSIS — J9621 Acute and chronic respiratory failure with hypoxia: Secondary | ICD-10-CM | POA: Diagnosis not present

## 2020-01-08 DIAGNOSIS — I2782 Chronic pulmonary embolism: Secondary | ICD-10-CM | POA: Diagnosis not present

## 2020-01-09 DIAGNOSIS — Z7901 Long term (current) use of anticoagulants: Secondary | ICD-10-CM | POA: Diagnosis not present

## 2020-01-09 DIAGNOSIS — G4733 Obstructive sleep apnea (adult) (pediatric): Secondary | ICD-10-CM | POA: Diagnosis not present

## 2020-01-09 DIAGNOSIS — N1832 Chronic kidney disease, stage 3b: Secondary | ICD-10-CM | POA: Diagnosis not present

## 2020-01-09 DIAGNOSIS — E119 Type 2 diabetes mellitus without complications: Secondary | ICD-10-CM | POA: Diagnosis not present

## 2020-01-09 DIAGNOSIS — I482 Chronic atrial fibrillation, unspecified: Secondary | ICD-10-CM | POA: Diagnosis not present

## 2020-01-09 DIAGNOSIS — J9621 Acute and chronic respiratory failure with hypoxia: Secondary | ICD-10-CM | POA: Diagnosis not present

## 2020-01-09 DIAGNOSIS — I2782 Chronic pulmonary embolism: Secondary | ICD-10-CM | POA: Diagnosis not present

## 2020-01-09 DIAGNOSIS — I825Z2 Chronic embolism and thrombosis of unspecified deep veins of left distal lower extremity: Secondary | ICD-10-CM | POA: Diagnosis not present

## 2020-01-09 DIAGNOSIS — I10 Essential (primary) hypertension: Secondary | ICD-10-CM | POA: Diagnosis not present

## 2020-01-10 DIAGNOSIS — I482 Chronic atrial fibrillation, unspecified: Secondary | ICD-10-CM | POA: Diagnosis not present

## 2020-01-10 DIAGNOSIS — I10 Essential (primary) hypertension: Secondary | ICD-10-CM | POA: Diagnosis not present

## 2020-01-10 DIAGNOSIS — J9621 Acute and chronic respiratory failure with hypoxia: Secondary | ICD-10-CM | POA: Diagnosis not present

## 2020-01-10 DIAGNOSIS — I825Z2 Chronic embolism and thrombosis of unspecified deep veins of left distal lower extremity: Secondary | ICD-10-CM | POA: Diagnosis not present

## 2020-01-10 DIAGNOSIS — Z7901 Long term (current) use of anticoagulants: Secondary | ICD-10-CM | POA: Diagnosis not present

## 2020-01-10 DIAGNOSIS — I2782 Chronic pulmonary embolism: Secondary | ICD-10-CM | POA: Diagnosis not present

## 2020-01-10 DIAGNOSIS — G4733 Obstructive sleep apnea (adult) (pediatric): Secondary | ICD-10-CM | POA: Diagnosis not present

## 2020-01-10 DIAGNOSIS — E119 Type 2 diabetes mellitus without complications: Secondary | ICD-10-CM | POA: Diagnosis not present

## 2020-01-10 DIAGNOSIS — N1832 Chronic kidney disease, stage 3b: Secondary | ICD-10-CM | POA: Diagnosis not present

## 2020-01-11 DIAGNOSIS — G4733 Obstructive sleep apnea (adult) (pediatric): Secondary | ICD-10-CM | POA: Diagnosis not present

## 2020-01-11 DIAGNOSIS — I2782 Chronic pulmonary embolism: Secondary | ICD-10-CM | POA: Diagnosis not present

## 2020-01-11 DIAGNOSIS — I825Z2 Chronic embolism and thrombosis of unspecified deep veins of left distal lower extremity: Secondary | ICD-10-CM | POA: Diagnosis not present

## 2020-01-11 DIAGNOSIS — Z7901 Long term (current) use of anticoagulants: Secondary | ICD-10-CM | POA: Diagnosis not present

## 2020-01-11 DIAGNOSIS — I10 Essential (primary) hypertension: Secondary | ICD-10-CM | POA: Diagnosis not present

## 2020-01-11 DIAGNOSIS — J9621 Acute and chronic respiratory failure with hypoxia: Secondary | ICD-10-CM | POA: Diagnosis not present

## 2020-01-11 DIAGNOSIS — N1832 Chronic kidney disease, stage 3b: Secondary | ICD-10-CM | POA: Diagnosis not present

## 2020-01-11 DIAGNOSIS — I482 Chronic atrial fibrillation, unspecified: Secondary | ICD-10-CM | POA: Diagnosis not present

## 2020-01-11 DIAGNOSIS — E119 Type 2 diabetes mellitus without complications: Secondary | ICD-10-CM | POA: Diagnosis not present

## 2020-01-12 DIAGNOSIS — I482 Chronic atrial fibrillation, unspecified: Secondary | ICD-10-CM | POA: Diagnosis not present

## 2020-01-12 DIAGNOSIS — N1832 Chronic kidney disease, stage 3b: Secondary | ICD-10-CM | POA: Diagnosis not present

## 2020-01-12 DIAGNOSIS — E119 Type 2 diabetes mellitus without complications: Secondary | ICD-10-CM | POA: Diagnosis not present

## 2020-01-12 DIAGNOSIS — J9621 Acute and chronic respiratory failure with hypoxia: Secondary | ICD-10-CM | POA: Diagnosis not present

## 2020-01-12 DIAGNOSIS — I10 Essential (primary) hypertension: Secondary | ICD-10-CM | POA: Diagnosis not present

## 2020-01-12 DIAGNOSIS — Z7901 Long term (current) use of anticoagulants: Secondary | ICD-10-CM | POA: Diagnosis not present

## 2020-01-12 DIAGNOSIS — I825Z2 Chronic embolism and thrombosis of unspecified deep veins of left distal lower extremity: Secondary | ICD-10-CM | POA: Diagnosis not present

## 2020-01-12 DIAGNOSIS — I2782 Chronic pulmonary embolism: Secondary | ICD-10-CM | POA: Diagnosis not present

## 2020-01-12 DIAGNOSIS — G4733 Obstructive sleep apnea (adult) (pediatric): Secondary | ICD-10-CM | POA: Diagnosis not present

## 2020-01-13 DIAGNOSIS — N1832 Chronic kidney disease, stage 3b: Secondary | ICD-10-CM | POA: Diagnosis not present

## 2020-01-13 DIAGNOSIS — M5136 Other intervertebral disc degeneration, lumbar region: Secondary | ICD-10-CM | POA: Diagnosis not present

## 2020-01-13 DIAGNOSIS — I825Z2 Chronic embolism and thrombosis of unspecified deep veins of left distal lower extremity: Secondary | ICD-10-CM | POA: Diagnosis not present

## 2020-01-13 DIAGNOSIS — I2782 Chronic pulmonary embolism: Secondary | ICD-10-CM | POA: Diagnosis not present

## 2020-01-13 DIAGNOSIS — I2510 Atherosclerotic heart disease of native coronary artery without angina pectoris: Secondary | ICD-10-CM | POA: Diagnosis not present

## 2020-01-13 DIAGNOSIS — E1122 Type 2 diabetes mellitus with diabetic chronic kidney disease: Secondary | ICD-10-CM | POA: Diagnosis not present

## 2020-01-13 DIAGNOSIS — E785 Hyperlipidemia, unspecified: Secondary | ICD-10-CM | POA: Diagnosis not present

## 2020-01-13 DIAGNOSIS — Z7901 Long term (current) use of anticoagulants: Secondary | ICD-10-CM | POA: Diagnosis not present

## 2020-01-13 DIAGNOSIS — J9621 Acute and chronic respiratory failure with hypoxia: Secondary | ICD-10-CM | POA: Diagnosis not present

## 2020-01-13 DIAGNOSIS — I129 Hypertensive chronic kidney disease with stage 1 through stage 4 chronic kidney disease, or unspecified chronic kidney disease: Secondary | ICD-10-CM | POA: Diagnosis not present

## 2020-01-13 DIAGNOSIS — G4733 Obstructive sleep apnea (adult) (pediatric): Secondary | ICD-10-CM | POA: Diagnosis not present

## 2020-01-13 DIAGNOSIS — I482 Chronic atrial fibrillation, unspecified: Secondary | ICD-10-CM | POA: Diagnosis not present

## 2020-01-14 DIAGNOSIS — I2782 Chronic pulmonary embolism: Secondary | ICD-10-CM | POA: Diagnosis not present

## 2020-01-14 DIAGNOSIS — N1832 Chronic kidney disease, stage 3b: Secondary | ICD-10-CM | POA: Diagnosis not present

## 2020-01-14 DIAGNOSIS — E785 Hyperlipidemia, unspecified: Secondary | ICD-10-CM | POA: Diagnosis not present

## 2020-01-14 DIAGNOSIS — M5136 Other intervertebral disc degeneration, lumbar region: Secondary | ICD-10-CM | POA: Diagnosis not present

## 2020-01-14 DIAGNOSIS — I2510 Atherosclerotic heart disease of native coronary artery without angina pectoris: Secondary | ICD-10-CM | POA: Diagnosis not present

## 2020-01-14 DIAGNOSIS — I482 Chronic atrial fibrillation, unspecified: Secondary | ICD-10-CM | POA: Diagnosis not present

## 2020-01-14 DIAGNOSIS — I129 Hypertensive chronic kidney disease with stage 1 through stage 4 chronic kidney disease, or unspecified chronic kidney disease: Secondary | ICD-10-CM | POA: Diagnosis not present

## 2020-01-14 DIAGNOSIS — J9621 Acute and chronic respiratory failure with hypoxia: Secondary | ICD-10-CM | POA: Diagnosis not present

## 2020-01-14 DIAGNOSIS — E1122 Type 2 diabetes mellitus with diabetic chronic kidney disease: Secondary | ICD-10-CM | POA: Diagnosis not present

## 2020-01-14 DIAGNOSIS — G4733 Obstructive sleep apnea (adult) (pediatric): Secondary | ICD-10-CM | POA: Diagnosis not present

## 2020-01-14 DIAGNOSIS — Z7901 Long term (current) use of anticoagulants: Secondary | ICD-10-CM | POA: Diagnosis not present

## 2020-01-14 DIAGNOSIS — I825Z2 Chronic embolism and thrombosis of unspecified deep veins of left distal lower extremity: Secondary | ICD-10-CM | POA: Diagnosis not present

## 2020-01-15 DIAGNOSIS — Z7901 Long term (current) use of anticoagulants: Secondary | ICD-10-CM | POA: Diagnosis not present

## 2020-01-15 DIAGNOSIS — I482 Chronic atrial fibrillation, unspecified: Secondary | ICD-10-CM | POA: Diagnosis not present

## 2020-01-15 DIAGNOSIS — E785 Hyperlipidemia, unspecified: Secondary | ICD-10-CM | POA: Diagnosis not present

## 2020-01-15 DIAGNOSIS — I825Z2 Chronic embolism and thrombosis of unspecified deep veins of left distal lower extremity: Secondary | ICD-10-CM | POA: Diagnosis not present

## 2020-01-15 DIAGNOSIS — E1122 Type 2 diabetes mellitus with diabetic chronic kidney disease: Secondary | ICD-10-CM | POA: Diagnosis not present

## 2020-01-15 DIAGNOSIS — I2510 Atherosclerotic heart disease of native coronary artery without angina pectoris: Secondary | ICD-10-CM | POA: Diagnosis not present

## 2020-01-15 DIAGNOSIS — N1832 Chronic kidney disease, stage 3b: Secondary | ICD-10-CM | POA: Diagnosis not present

## 2020-01-15 DIAGNOSIS — I2782 Chronic pulmonary embolism: Secondary | ICD-10-CM | POA: Diagnosis not present

## 2020-01-15 DIAGNOSIS — J9621 Acute and chronic respiratory failure with hypoxia: Secondary | ICD-10-CM | POA: Diagnosis not present

## 2020-01-15 DIAGNOSIS — M5136 Other intervertebral disc degeneration, lumbar region: Secondary | ICD-10-CM | POA: Diagnosis not present

## 2020-01-15 DIAGNOSIS — G4733 Obstructive sleep apnea (adult) (pediatric): Secondary | ICD-10-CM | POA: Diagnosis not present

## 2020-01-15 DIAGNOSIS — I129 Hypertensive chronic kidney disease with stage 1 through stage 4 chronic kidney disease, or unspecified chronic kidney disease: Secondary | ICD-10-CM | POA: Diagnosis not present

## 2020-01-16 DIAGNOSIS — M5136 Other intervertebral disc degeneration, lumbar region: Secondary | ICD-10-CM | POA: Diagnosis not present

## 2020-01-16 DIAGNOSIS — J9621 Acute and chronic respiratory failure with hypoxia: Secondary | ICD-10-CM | POA: Diagnosis not present

## 2020-01-16 DIAGNOSIS — Z7901 Long term (current) use of anticoagulants: Secondary | ICD-10-CM | POA: Diagnosis not present

## 2020-01-16 DIAGNOSIS — I2782 Chronic pulmonary embolism: Secondary | ICD-10-CM | POA: Diagnosis not present

## 2020-01-16 DIAGNOSIS — I2510 Atherosclerotic heart disease of native coronary artery without angina pectoris: Secondary | ICD-10-CM | POA: Diagnosis not present

## 2020-01-16 DIAGNOSIS — G4733 Obstructive sleep apnea (adult) (pediatric): Secondary | ICD-10-CM | POA: Diagnosis not present

## 2020-01-16 DIAGNOSIS — E785 Hyperlipidemia, unspecified: Secondary | ICD-10-CM | POA: Diagnosis not present

## 2020-01-16 DIAGNOSIS — I129 Hypertensive chronic kidney disease with stage 1 through stage 4 chronic kidney disease, or unspecified chronic kidney disease: Secondary | ICD-10-CM | POA: Diagnosis not present

## 2020-01-16 DIAGNOSIS — I825Z2 Chronic embolism and thrombosis of unspecified deep veins of left distal lower extremity: Secondary | ICD-10-CM | POA: Diagnosis not present

## 2020-01-16 DIAGNOSIS — N1832 Chronic kidney disease, stage 3b: Secondary | ICD-10-CM | POA: Diagnosis not present

## 2020-01-16 DIAGNOSIS — I482 Chronic atrial fibrillation, unspecified: Secondary | ICD-10-CM | POA: Diagnosis not present

## 2020-01-16 DIAGNOSIS — E1122 Type 2 diabetes mellitus with diabetic chronic kidney disease: Secondary | ICD-10-CM | POA: Diagnosis not present

## 2020-01-19 DIAGNOSIS — I2699 Other pulmonary embolism without acute cor pulmonale: Secondary | ICD-10-CM | POA: Diagnosis not present

## 2020-01-19 DIAGNOSIS — I482 Chronic atrial fibrillation, unspecified: Secondary | ICD-10-CM | POA: Diagnosis not present

## 2020-01-19 DIAGNOSIS — Z9981 Dependence on supplemental oxygen: Secondary | ICD-10-CM | POA: Diagnosis not present

## 2020-01-19 DIAGNOSIS — J9621 Acute and chronic respiratory failure with hypoxia: Secondary | ICD-10-CM | POA: Diagnosis not present

## 2020-01-19 DIAGNOSIS — M5440 Lumbago with sciatica, unspecified side: Secondary | ICD-10-CM | POA: Diagnosis not present

## 2020-01-19 DIAGNOSIS — G4733 Obstructive sleep apnea (adult) (pediatric): Secondary | ICD-10-CM | POA: Diagnosis not present

## 2020-01-19 DIAGNOSIS — R54 Age-related physical debility: Secondary | ICD-10-CM | POA: Diagnosis not present

## 2020-01-19 DIAGNOSIS — J1282 Pneumonia due to coronavirus disease 2019: Secondary | ICD-10-CM | POA: Diagnosis not present

## 2020-01-19 DIAGNOSIS — E1122 Type 2 diabetes mellitus with diabetic chronic kidney disease: Secondary | ICD-10-CM | POA: Diagnosis not present

## 2020-01-19 DIAGNOSIS — E785 Hyperlipidemia, unspecified: Secondary | ICD-10-CM | POA: Diagnosis not present

## 2020-01-19 DIAGNOSIS — I129 Hypertensive chronic kidney disease with stage 1 through stage 4 chronic kidney disease, or unspecified chronic kidney disease: Secondary | ICD-10-CM | POA: Diagnosis not present

## 2020-01-19 DIAGNOSIS — U071 COVID-19: Secondary | ICD-10-CM | POA: Diagnosis not present

## 2020-01-19 DIAGNOSIS — I82502 Chronic embolism and thrombosis of unspecified deep veins of left lower extremity: Secondary | ICD-10-CM | POA: Diagnosis not present

## 2020-01-19 DIAGNOSIS — N1832 Chronic kidney disease, stage 3b: Secondary | ICD-10-CM | POA: Diagnosis not present

## 2020-01-19 DIAGNOSIS — I2510 Atherosclerotic heart disease of native coronary artery without angina pectoris: Secondary | ICD-10-CM | POA: Diagnosis not present

## 2020-01-19 DIAGNOSIS — Z7901 Long term (current) use of anticoagulants: Secondary | ICD-10-CM | POA: Diagnosis not present

## 2020-01-19 DIAGNOSIS — J1281 Pneumonia due to SARS-associated coronavirus: Secondary | ICD-10-CM | POA: Diagnosis not present

## 2020-01-20 ENCOUNTER — Telehealth (INDEPENDENT_AMBULATORY_CARE_PROVIDER_SITE_OTHER): Payer: Medicare Other | Admitting: Sports Medicine

## 2020-01-20 ENCOUNTER — Encounter: Payer: Self-pay | Admitting: Sports Medicine

## 2020-01-20 DIAGNOSIS — U071 COVID-19: Secondary | ICD-10-CM | POA: Insufficient documentation

## 2020-01-20 DIAGNOSIS — J1282 Pneumonia due to coronavirus disease 2019: Secondary | ICD-10-CM | POA: Insufficient documentation

## 2020-01-20 DIAGNOSIS — I129 Hypertensive chronic kidney disease with stage 1 through stage 4 chronic kidney disease, or unspecified chronic kidney disease: Secondary | ICD-10-CM | POA: Diagnosis not present

## 2020-01-20 DIAGNOSIS — J9621 Acute and chronic respiratory failure with hypoxia: Secondary | ICD-10-CM | POA: Diagnosis not present

## 2020-01-20 DIAGNOSIS — E1122 Type 2 diabetes mellitus with diabetic chronic kidney disease: Secondary | ICD-10-CM | POA: Diagnosis not present

## 2020-01-20 DIAGNOSIS — N1832 Chronic kidney disease, stage 3b: Secondary | ICD-10-CM | POA: Diagnosis not present

## 2020-01-20 NOTE — Progress Notes (Signed)
   Virtual Visit via Telephone   I connected with  Jeff Green  on 01/20/20 by telephone/telehealth and verified that I am speaking with the correct person using two identifiers.   I discussed the limitations, risks, security and privacy concerns of performing an evaluation and management service by telephone, including the higher likelihood of inaccurate diagnosis and treatment, and the availability of in person appointments.  We also discussed the likely need of an additional face to face encounter for complete and high quality delivery of care.  I also discussed with the patient that there may be a patient responsible charge related to this service. The patient expressed understanding and wishes to proceed.  Provider location is in medical facility. Patient location is at their home, different from provider location. People involved in care of the patient during this telehealth encounter were myself, my nurse/medical assistant, and my front office/scheduling team member.  Review of Systems: No fevers, chills, night sweats, weight loss, chest pain, or shortness of breath.   Objective Findings:    General: Speaking full sentences, no audible heavy breathing.  Sounds alert and appropriately interactive.    Independent interpretation of tests performed by another provider:   None.  Brief History, Exam, Impression, and Recommendations:    Pneumonia due to COVID-19 virus Jeff Green was hospitalized about 2 weeks ago for severe Covid, he had Covid pneumonia, with respiratory failure, he was out of the remdesivir window and was treated with Decadron in the hospital. He was recently discharged. He is weak, he is on oxygen via nasal cannula, he has home health physical therapy and nursing. He tells me overall he feels good albeit weak. He is consistently resistant to the vaccine and feels like it is hit or miss. He should come to see me in the office for labs, chest x-ray and physical exam  whenever he feels strong enough to leave his home.    I discussed the above assessment and treatment plan with the patient. The patient was provided an opportunity to ask questions and all were answered. The patient agreed with the plan and demonstrated an understanding of the instructions.   The patient was advised to call back or seek an in-person evaluation if the symptoms worsen or if the condition fails to improve as anticipated.   I provided 30 minutes of face to face and non-face-to-face time during this encounter date, time was needed to gather information, review chart, records, communicate/coordinate with staff remotely, as well as complete documentation.   ___________________________________________ Gwen Her. Dianah Field, M.D., ABFM., CAQSM. Primary Care and Sports Medicine [facility]  Adjunct Professor of [place] of [facility] of Medicine

## 2020-01-20 NOTE — Assessment & Plan Note (Signed)
Jeff Green was hospitalized about 2 weeks ago for severe Covid, he had Covid pneumonia, with respiratory failure, he was out of the remdesivir window and was treated with Decadron in the hospital. He was recently discharged. He is weak, he is on oxygen via nasal cannula, he has home health physical therapy and nursing. He tells me overall he feels good albeit weak. He is consistently resistant to the vaccine and feels like it is hit or miss. He should come to see me in the office for labs, chest x-ray and physical exam whenever he feels strong enough to leave his home.

## 2020-01-21 DIAGNOSIS — E1122 Type 2 diabetes mellitus with diabetic chronic kidney disease: Secondary | ICD-10-CM | POA: Diagnosis not present

## 2020-01-21 DIAGNOSIS — I129 Hypertensive chronic kidney disease with stage 1 through stage 4 chronic kidney disease, or unspecified chronic kidney disease: Secondary | ICD-10-CM | POA: Diagnosis not present

## 2020-01-21 DIAGNOSIS — J1282 Pneumonia due to coronavirus disease 2019: Secondary | ICD-10-CM | POA: Diagnosis not present

## 2020-01-21 DIAGNOSIS — N1832 Chronic kidney disease, stage 3b: Secondary | ICD-10-CM | POA: Diagnosis not present

## 2020-01-21 DIAGNOSIS — U071 COVID-19: Secondary | ICD-10-CM | POA: Diagnosis not present

## 2020-01-21 DIAGNOSIS — J9621 Acute and chronic respiratory failure with hypoxia: Secondary | ICD-10-CM | POA: Diagnosis not present

## 2020-01-22 DIAGNOSIS — J9621 Acute and chronic respiratory failure with hypoxia: Secondary | ICD-10-CM | POA: Diagnosis not present

## 2020-01-22 DIAGNOSIS — E1122 Type 2 diabetes mellitus with diabetic chronic kidney disease: Secondary | ICD-10-CM | POA: Diagnosis not present

## 2020-01-22 DIAGNOSIS — I129 Hypertensive chronic kidney disease with stage 1 through stage 4 chronic kidney disease, or unspecified chronic kidney disease: Secondary | ICD-10-CM | POA: Diagnosis not present

## 2020-01-22 DIAGNOSIS — J1282 Pneumonia due to coronavirus disease 2019: Secondary | ICD-10-CM | POA: Diagnosis not present

## 2020-01-22 DIAGNOSIS — U071 COVID-19: Secondary | ICD-10-CM | POA: Diagnosis not present

## 2020-01-22 DIAGNOSIS — N1832 Chronic kidney disease, stage 3b: Secondary | ICD-10-CM | POA: Diagnosis not present

## 2020-01-25 DIAGNOSIS — J9621 Acute and chronic respiratory failure with hypoxia: Secondary | ICD-10-CM | POA: Diagnosis not present

## 2020-01-25 DIAGNOSIS — U071 COVID-19: Secondary | ICD-10-CM | POA: Diagnosis not present

## 2020-01-25 DIAGNOSIS — I129 Hypertensive chronic kidney disease with stage 1 through stage 4 chronic kidney disease, or unspecified chronic kidney disease: Secondary | ICD-10-CM | POA: Diagnosis not present

## 2020-01-25 DIAGNOSIS — J1282 Pneumonia due to coronavirus disease 2019: Secondary | ICD-10-CM | POA: Diagnosis not present

## 2020-01-25 DIAGNOSIS — E1122 Type 2 diabetes mellitus with diabetic chronic kidney disease: Secondary | ICD-10-CM | POA: Diagnosis not present

## 2020-01-25 DIAGNOSIS — N1832 Chronic kidney disease, stage 3b: Secondary | ICD-10-CM | POA: Diagnosis not present

## 2020-01-26 DIAGNOSIS — N1832 Chronic kidney disease, stage 3b: Secondary | ICD-10-CM | POA: Diagnosis not present

## 2020-01-26 DIAGNOSIS — I129 Hypertensive chronic kidney disease with stage 1 through stage 4 chronic kidney disease, or unspecified chronic kidney disease: Secondary | ICD-10-CM | POA: Diagnosis not present

## 2020-01-26 DIAGNOSIS — U071 COVID-19: Secondary | ICD-10-CM | POA: Diagnosis not present

## 2020-01-26 DIAGNOSIS — J9621 Acute and chronic respiratory failure with hypoxia: Secondary | ICD-10-CM | POA: Diagnosis not present

## 2020-01-26 DIAGNOSIS — J1282 Pneumonia due to coronavirus disease 2019: Secondary | ICD-10-CM | POA: Diagnosis not present

## 2020-01-26 DIAGNOSIS — E1122 Type 2 diabetes mellitus with diabetic chronic kidney disease: Secondary | ICD-10-CM | POA: Diagnosis not present

## 2020-01-27 ENCOUNTER — Other Ambulatory Visit: Payer: Self-pay | Admitting: Sports Medicine

## 2020-01-28 DIAGNOSIS — N1832 Chronic kidney disease, stage 3b: Secondary | ICD-10-CM | POA: Diagnosis not present

## 2020-01-28 DIAGNOSIS — I129 Hypertensive chronic kidney disease with stage 1 through stage 4 chronic kidney disease, or unspecified chronic kidney disease: Secondary | ICD-10-CM | POA: Diagnosis not present

## 2020-01-28 DIAGNOSIS — J1282 Pneumonia due to coronavirus disease 2019: Secondary | ICD-10-CM | POA: Diagnosis not present

## 2020-01-28 DIAGNOSIS — E1122 Type 2 diabetes mellitus with diabetic chronic kidney disease: Secondary | ICD-10-CM | POA: Diagnosis not present

## 2020-01-28 DIAGNOSIS — U071 COVID-19: Secondary | ICD-10-CM | POA: Diagnosis not present

## 2020-01-28 DIAGNOSIS — J9621 Acute and chronic respiratory failure with hypoxia: Secondary | ICD-10-CM | POA: Diagnosis not present

## 2020-02-02 DIAGNOSIS — J9621 Acute and chronic respiratory failure with hypoxia: Secondary | ICD-10-CM | POA: Diagnosis not present

## 2020-02-02 DIAGNOSIS — N1832 Chronic kidney disease, stage 3b: Secondary | ICD-10-CM | POA: Diagnosis not present

## 2020-02-02 DIAGNOSIS — U071 COVID-19: Secondary | ICD-10-CM | POA: Diagnosis not present

## 2020-02-02 DIAGNOSIS — J1282 Pneumonia due to coronavirus disease 2019: Secondary | ICD-10-CM | POA: Diagnosis not present

## 2020-02-02 DIAGNOSIS — I129 Hypertensive chronic kidney disease with stage 1 through stage 4 chronic kidney disease, or unspecified chronic kidney disease: Secondary | ICD-10-CM | POA: Diagnosis not present

## 2020-02-02 DIAGNOSIS — E1122 Type 2 diabetes mellitus with diabetic chronic kidney disease: Secondary | ICD-10-CM | POA: Diagnosis not present

## 2020-02-04 DIAGNOSIS — I129 Hypertensive chronic kidney disease with stage 1 through stage 4 chronic kidney disease, or unspecified chronic kidney disease: Secondary | ICD-10-CM | POA: Diagnosis not present

## 2020-02-04 DIAGNOSIS — J1282 Pneumonia due to coronavirus disease 2019: Secondary | ICD-10-CM | POA: Diagnosis not present

## 2020-02-04 DIAGNOSIS — E1122 Type 2 diabetes mellitus with diabetic chronic kidney disease: Secondary | ICD-10-CM | POA: Diagnosis not present

## 2020-02-04 DIAGNOSIS — J9621 Acute and chronic respiratory failure with hypoxia: Secondary | ICD-10-CM | POA: Diagnosis not present

## 2020-02-04 DIAGNOSIS — N1832 Chronic kidney disease, stage 3b: Secondary | ICD-10-CM | POA: Diagnosis not present

## 2020-02-04 DIAGNOSIS — U071 COVID-19: Secondary | ICD-10-CM | POA: Diagnosis not present

## 2020-02-08 DIAGNOSIS — U071 COVID-19: Secondary | ICD-10-CM | POA: Diagnosis not present

## 2020-02-08 DIAGNOSIS — I129 Hypertensive chronic kidney disease with stage 1 through stage 4 chronic kidney disease, or unspecified chronic kidney disease: Secondary | ICD-10-CM | POA: Diagnosis not present

## 2020-02-08 DIAGNOSIS — N1832 Chronic kidney disease, stage 3b: Secondary | ICD-10-CM | POA: Diagnosis not present

## 2020-02-08 DIAGNOSIS — J9621 Acute and chronic respiratory failure with hypoxia: Secondary | ICD-10-CM | POA: Diagnosis not present

## 2020-02-08 DIAGNOSIS — J1282 Pneumonia due to coronavirus disease 2019: Secondary | ICD-10-CM | POA: Diagnosis not present

## 2020-02-08 DIAGNOSIS — E1122 Type 2 diabetes mellitus with diabetic chronic kidney disease: Secondary | ICD-10-CM | POA: Diagnosis not present

## 2020-02-09 ENCOUNTER — Telehealth: Payer: Self-pay

## 2020-02-09 DIAGNOSIS — E1122 Type 2 diabetes mellitus with diabetic chronic kidney disease: Secondary | ICD-10-CM | POA: Diagnosis not present

## 2020-02-09 DIAGNOSIS — U071 COVID-19: Secondary | ICD-10-CM | POA: Diagnosis not present

## 2020-02-09 DIAGNOSIS — J9621 Acute and chronic respiratory failure with hypoxia: Secondary | ICD-10-CM | POA: Diagnosis not present

## 2020-02-09 DIAGNOSIS — N1832 Chronic kidney disease, stage 3b: Secondary | ICD-10-CM | POA: Diagnosis not present

## 2020-02-09 DIAGNOSIS — J1282 Pneumonia due to coronavirus disease 2019: Secondary | ICD-10-CM | POA: Diagnosis not present

## 2020-02-09 DIAGNOSIS — I129 Hypertensive chronic kidney disease with stage 1 through stage 4 chronic kidney disease, or unspecified chronic kidney disease: Secondary | ICD-10-CM | POA: Diagnosis not present

## 2020-02-09 NOTE — Telephone Encounter (Signed)
Patient calls to report that he is only using 2.5L the O2 at night.  He also stated that he is having some sinus issues with runny nose sometimes bloody when he blows it.  He also reports having a sore throat which he has been treating by gargling with warm salt water.  The therapist that comes to his home told him to call and see if you would call him in a Zpak.

## 2020-02-09 NOTE — Telephone Encounter (Signed)
I think we should do at least a virtual visit to determine if this is a rhinitis, sinusitis, pharyngitis, laryngitis.  I have plenty of openings this week.

## 2020-02-10 ENCOUNTER — Telehealth (INDEPENDENT_AMBULATORY_CARE_PROVIDER_SITE_OTHER): Payer: Medicare Other | Admitting: Sports Medicine

## 2020-02-10 DIAGNOSIS — J00 Acute nasopharyngitis [common cold]: Secondary | ICD-10-CM

## 2020-02-10 MED ORDER — FLUTICASONE PROPIONATE 50 MCG/ACT NA SUSP: NASAL | 3 refills | Status: DC

## 2020-02-10 MED ORDER — SALINE SPRAY 0.65 % NA SOLN: 1.0000 | NASAL | 0 refills | Status: DC

## 2020-02-10 NOTE — Assessment & Plan Note (Signed)
Jeff Green is a pleasant 64 year old male, he is recovering from COVID-19, he has had a home O2 requirement, slowly improving. More recently he has started to come off of oxygen. O2 saturation at rest is 97% and drops to 93% and sometimes less with activity. Unfortunately he has noted some runny nose with occasional epistaxis. I think this is likely due to the oxygen therapy, it sounds as though he does have a humidifier on his O2 system, he is using it less currently which will help, I would also like him to do nasal saline and Flonase daily. We can touch base regarding this again in about 2 weeks at which point I may need to see him in the office to examine his nasal passageways. I did inform him that the location of his discomfort which was over his nare is not his sinus, so this is unlikely to represent a maxillary sinusitis.

## 2020-02-10 NOTE — Telephone Encounter (Signed)
Unable to reach pt. I called spouse and she will have him to call us to schedule virtual visit.  Thank you.

## 2020-02-10 NOTE — Progress Notes (Signed)
Sinuses dry with using Os at night.  Bloody when blowing nose.  Post-nasal drip with sore throat gargling with salt water.  Tried chloraseptic.

## 2020-02-10 NOTE — Progress Notes (Signed)
   Virtual Visit via Telephone   I connected with  Jeff Green  on 02/10/20 by telephone/telehealth and verified that I am speaking with the correct person using two identifiers.   I discussed the limitations, risks, security and privacy concerns of performing an evaluation and management service by telephone, including the higher likelihood of inaccurate diagnosis and treatment, and the availability of in person appointments.  We also discussed the likely need of an additional face to face encounter for complete and high quality delivery of care.  I also discussed with the patient that there may be a patient responsible charge related to this service. The patient expressed understanding and wishes to proceed.  Provider location is in medical facility. Patient location is at their home, different from provider location. People involved in care of the patient during this telehealth encounter were myself, my nurse/medical assistant, and my front office/scheduling team member.  Review of Systems: No fevers, chills, night sweats, weight loss, chest pain, or shortness of breath.   Objective Findings:    General: Speaking full sentences, no audible heavy breathing.  Sounds alert and appropriately interactive.    Independent interpretation of tests performed by another provider:   None.  Brief History, Exam, Impression, and Recommendations:    Acute irritant rhinitis Jeff Green is a pleasant 64 year old male, he is recovering from COVID-19, he has had a home O2 requirement, slowly improving. More recently he has started to come off of oxygen. O2 saturation at rest is 97% and drops to 93% and sometimes less with activity. Unfortunately he has noted some runny nose with occasional epistaxis. I think this is likely due to the oxygen therapy, it sounds as though he does have a humidifier on his O2 system, he is using it less currently which will help, I would also like him to do nasal saline and  Flonase daily. We can touch base regarding this again in about 2 weeks at which point I may need to see him in the office to examine his nasal passageways. I did inform him that the location of his discomfort which was over his nare is not his sinus, so this is unlikely to represent a maxillary sinusitis.   I discussed the above assessment and treatment plan with the patient. The patient was provided an opportunity to ask questions and all were answered. The patient agreed with the plan and demonstrated an understanding of the instructions.   The patient was advised to call back or seek an in-person evaluation if the symptoms worsen or if the condition fails to improve as anticipated.   I provided 30 minutes of face to face and non-face-to-face time during this encounter date, time was needed to gather information, review chart, records, communicate/coordinate with staff remotely, as well as complete documentation.   ___________________________________________ Gwen Her. Dianah Field, M.D., ABFM., CAQSM. Primary Care and Sports Medicine [facility]  Adjunct Professor of [place] of [facility] of Medicine

## 2020-02-10 NOTE — Telephone Encounter (Signed)
Please call and schedule virtual

## 2020-02-12 DIAGNOSIS — I129 Hypertensive chronic kidney disease with stage 1 through stage 4 chronic kidney disease, or unspecified chronic kidney disease: Secondary | ICD-10-CM | POA: Diagnosis not present

## 2020-02-12 DIAGNOSIS — E1122 Type 2 diabetes mellitus with diabetic chronic kidney disease: Secondary | ICD-10-CM | POA: Diagnosis not present

## 2020-02-12 DIAGNOSIS — N1832 Chronic kidney disease, stage 3b: Secondary | ICD-10-CM | POA: Diagnosis not present

## 2020-02-12 DIAGNOSIS — J1282 Pneumonia due to coronavirus disease 2019: Secondary | ICD-10-CM | POA: Diagnosis not present

## 2020-02-12 DIAGNOSIS — U071 COVID-19: Secondary | ICD-10-CM | POA: Diagnosis not present

## 2020-02-12 DIAGNOSIS — J9621 Acute and chronic respiratory failure with hypoxia: Secondary | ICD-10-CM | POA: Diagnosis not present

## 2020-02-16 DIAGNOSIS — J1282 Pneumonia due to coronavirus disease 2019: Secondary | ICD-10-CM | POA: Diagnosis not present

## 2020-02-16 DIAGNOSIS — E1122 Type 2 diabetes mellitus with diabetic chronic kidney disease: Secondary | ICD-10-CM | POA: Diagnosis not present

## 2020-02-16 DIAGNOSIS — U071 COVID-19: Secondary | ICD-10-CM | POA: Diagnosis not present

## 2020-02-16 DIAGNOSIS — I129 Hypertensive chronic kidney disease with stage 1 through stage 4 chronic kidney disease, or unspecified chronic kidney disease: Secondary | ICD-10-CM | POA: Diagnosis not present

## 2020-02-16 DIAGNOSIS — J9621 Acute and chronic respiratory failure with hypoxia: Secondary | ICD-10-CM | POA: Diagnosis not present

## 2020-02-16 DIAGNOSIS — N1832 Chronic kidney disease, stage 3b: Secondary | ICD-10-CM | POA: Diagnosis not present

## 2020-02-17 DIAGNOSIS — U071 COVID-19: Secondary | ICD-10-CM | POA: Diagnosis not present

## 2020-02-17 DIAGNOSIS — J1282 Pneumonia due to coronavirus disease 2019: Secondary | ICD-10-CM | POA: Diagnosis not present

## 2020-02-17 DIAGNOSIS — J9621 Acute and chronic respiratory failure with hypoxia: Secondary | ICD-10-CM | POA: Diagnosis not present

## 2020-02-17 DIAGNOSIS — E1122 Type 2 diabetes mellitus with diabetic chronic kidney disease: Secondary | ICD-10-CM | POA: Diagnosis not present

## 2020-02-17 DIAGNOSIS — I129 Hypertensive chronic kidney disease with stage 1 through stage 4 chronic kidney disease, or unspecified chronic kidney disease: Secondary | ICD-10-CM | POA: Diagnosis not present

## 2020-02-17 DIAGNOSIS — N1832 Chronic kidney disease, stage 3b: Secondary | ICD-10-CM | POA: Diagnosis not present

## 2020-02-18 DIAGNOSIS — U071 COVID-19: Secondary | ICD-10-CM | POA: Diagnosis not present

## 2020-02-19 ENCOUNTER — Telehealth: Payer: Self-pay

## 2020-02-19 NOTE — Telephone Encounter (Signed)
As long as he is feeling better this is probably okay, if he develops any worsening shortness of breath, or pleuritic chest pain we would need to get a CT of the chest.

## 2020-02-19 NOTE — Telephone Encounter (Signed)
Patient called to report that his therapist has been working with him on deep core and productive coughing to clear his lungs.  This morning he coughed up 3 small blood tinted balls.  Patient is not concerned as he talked with this therapist as well.  He just wanted to let us know.  He states that he is feeling better by clearing his lungs with these exercises.

## 2020-02-19 NOTE — Telephone Encounter (Signed)
Patient aware of recommendations and will let us know if anything changes.

## 2020-02-25 ENCOUNTER — Other Ambulatory Visit: Payer: Self-pay | Admitting: Sports Medicine

## 2020-02-25 DIAGNOSIS — M48061 Spinal stenosis, lumbar region without neurogenic claudication: Secondary | ICD-10-CM

## 2020-03-15 ENCOUNTER — Other Ambulatory Visit: Payer: Self-pay | Admitting: Sports Medicine

## 2020-03-15 DIAGNOSIS — E785 Hyperlipidemia, unspecified: Secondary | ICD-10-CM

## 2020-03-16 ENCOUNTER — Telehealth: Payer: Self-pay

## 2020-03-16 ENCOUNTER — Other Ambulatory Visit: Payer: Self-pay

## 2020-03-16 DIAGNOSIS — I480 Paroxysmal atrial fibrillation: Secondary | ICD-10-CM

## 2020-03-16 DIAGNOSIS — E785 Hyperlipidemia, unspecified: Secondary | ICD-10-CM

## 2020-03-16 MED ORDER — WARFARIN SODIUM 7.5 MG PO TABS: 7.5000 mg | ORAL_TABLET | Freq: Every day | ORAL | 0 refills | Status: DC

## 2020-03-16 MED ORDER — ROSUVASTATIN CALCIUM 40 MG PO TABS: 40.0000 mg | ORAL_TABLET | Freq: Every day | ORAL | 0 refills | Status: DC

## 2020-03-16 NOTE — Telephone Encounter (Signed)
FYI - April called to let us know that patient refused his last skilled nurse visit today.

## 2020-03-21 ENCOUNTER — Encounter: Payer: Self-pay | Admitting: Sports Medicine

## 2020-03-21 ENCOUNTER — Ambulatory Visit (INDEPENDENT_AMBULATORY_CARE_PROVIDER_SITE_OTHER): Payer: Medicare Other

## 2020-03-21 ENCOUNTER — Ambulatory Visit (INDEPENDENT_AMBULATORY_CARE_PROVIDER_SITE_OTHER): Payer: Medicare Other | Admitting: Sports Medicine

## 2020-03-21 DIAGNOSIS — Z0000 Encounter for general adult medical examination without abnormal findings: Secondary | ICD-10-CM

## 2020-03-21 DIAGNOSIS — U071 COVID-19: Secondary | ICD-10-CM | POA: Diagnosis not present

## 2020-03-21 DIAGNOSIS — M19012 Primary osteoarthritis, left shoulder: Secondary | ICD-10-CM | POA: Diagnosis not present

## 2020-03-21 DIAGNOSIS — L304 Erythema intertrigo: Secondary | ICD-10-CM | POA: Diagnosis not present

## 2020-03-21 DIAGNOSIS — E119 Type 2 diabetes mellitus without complications: Secondary | ICD-10-CM

## 2020-03-21 DIAGNOSIS — J1282 Pneumonia due to coronavirus disease 2019: Secondary | ICD-10-CM | POA: Diagnosis not present

## 2020-03-21 DIAGNOSIS — M19011 Primary osteoarthritis, right shoulder: Secondary | ICD-10-CM

## 2020-03-21 MED ORDER — CLOTRIMAZOLE-BETAMETHASONE 1-0.05 % EX CREA: 1.0000 "application " | TOPICAL_CREAM | Freq: Two times a day (BID) | CUTANEOUS | 0 refills | Status: DC

## 2020-03-21 MED ORDER — DESITIN 13 % EX CREA: TOPICAL_CREAM | CUTANEOUS | 11 refills | Status: DC

## 2020-03-21 NOTE — Assessment & Plan Note (Signed)
Jeff Green was in the hospital for severe Covid with Covid pneumonia, respiratory failure, he was outside of the remdesivir window and was treated with Decadron. Overall his O2 saturation and requirement has improved considerably over time, he does have a portable oxygen concentrator that he uses for long walks but otherwise he is about 96% at rest. He continues to improve, he continues to also be resistant to the COVID-19 vaccine in spite of his near death experience. He understands that we will simply have to watch this for now. Clinically he is stable.

## 2020-03-21 NOTE — Patient Instructions (Signed)
Intertrigo Intertrigo is skin irritation or inflammation (dermatitis) that occurs when folds of skin rub together. The irritation can cause a rash and make skin raw and itchy. This condition most commonly occurs in the skin folds of these areas:  Toes.  Armpits.  Groin.  Under the belly.  Under the breasts.  Buttocks. Intertrigo is not passed from person to person (is not contagious). What are the causes? This condition is caused by heat, moisture, rubbing (friction), and not enough air circulation. The condition can be made worse by:  Sweat.  Bacteria.  A fungus, such as yeast. What increases the risk? This condition is more likely to occur if you have moisture in your skin folds. You are more likely to develop this condition if you:  Have diabetes.  Are overweight.  Are not able to move around or are not active.  Live in a warm and moist climate.  Wear splints, braces, or other medical devices.  Are not able to control your bowels or bladder (have incontinence). What are the signs or symptoms? Symptoms of this condition include:  A pink or red skin rash in the skin fold or near the skin fold.  Raw or scaly skin.  Itchiness.  A burning feeling.  Bleeding.  Leaking fluid.  A bad smell. How is this diagnosed? This condition is diagnosed with a medical history and physical exam. You may also have a skin swab to test for bacteria or a fungus. How is this treated? This condition may be treated by:  Cleaning and drying your skin.  Taking an antibiotic medicine or using an antibiotic skin cream for a bacterial infection.  Using an antifungal cream on your skin or taking pills for an infection that was caused by a fungus, such as yeast.  Using a steroid ointment to relieve itchiness and irritation.  Separating the skin fold with a clean cotton cloth to absorb moisture and allow air to flow into the area. Follow these instructions at home:  Keep the  affected area clean and dry.  Do not scratch your skin.  Stay in a cool environment as much as possible. Use an air conditioner or fan, if available.  Apply over-the-counter and prescription medicines only as told by your health care provider.  If you were prescribed an antibiotic medicine, use it as told by your health care provider. Do not stop using the antibiotic even if your condition improves.  Keep all follow-up visits as told by your health care provider. This is important. How is this prevented?   Maintain a healthy weight.  Take care of your feet, especially if you have diabetes. Foot care includes: ? Wearing shoes that fit well. ? Keeping your feet dry. ? Wearing clean, breathable socks.  Protect the skin around your groin and buttocks, especially if you have incontinence. Skin protection includes: ? Following a regular cleaning routine. ? Using skin protectant creams, powders, or ointments. ? Changing protection pads frequently.  Do not wear tight clothes. Wear clothes that are loose, absorbent, and made of cotton.  Wear a bra that gives good support, if needed.  Shower and dry yourself well after activity or exercise. Use a hair dryer on a cool setting to dry between skin folds, especially after you bathe.  If you have diabetes, keep your blood sugar under control. Contact a health care provider if:  Your symptoms do not improve with treatment.  Your symptoms get worse or they spread.  You notice increased redness and  warmth.  You have a fever. Summary  Intertrigo is skin irritation or inflammation (dermatitis) that occurs when folds of skin rub together.  This condition is caused by heat, moisture, rubbing (friction), and not enough air circulation.  This condition may be treated by cleaning and drying your skin and with medicines.  Apply over-the-counter and prescription medicines only as told by your health care provider.  Keep all follow-up visits  as told by your health care provider. This is important. This information is not intended to replace advice given to you by your health care provider. Make sure you discuss any questions you have with your health care provider. Document Revised: 09/09/2017 Document Reviewed: 09/09/2017 Elsevier Patient Education  [place].

## 2020-03-21 NOTE — Progress Notes (Signed)
    Procedures performed today:    Procedure: Real-time Ultrasound Guided injection of the left glenohumeral joint Device: Samsung HS60  Verbal informed consent obtained.  Time-out conducted.  Noted no overlying erythema, induration, or other signs of local infection.  Skin prepped in a sterile fashion.  Local anesthesia: Topical Ethyl chloride.  With sterile technique and under real time ultrasound guidance: 1 cc Kenalog 40, 2 cc lidocaine, 2 cc bupivacaine injected easily Completed without difficulty  Advised to call if fevers/chills, erythema, induration, drainage, or persistent bleeding.  Images permanently stored and available for review in PACS.  Impression: Technically successful ultrasound guided injection.  Procedure: Real-time Ultrasound Guided injection of the right glenohumeral joint Device: Samsung HS60  Verbal informed consent obtained.  Time-out conducted.  Noted no overlying erythema, induration, or other signs of local infection.  Skin prepped in a sterile fashion.  Local anesthesia: Topical Ethyl chloride.  With sterile technique and under real time ultrasound guidance: 1 cc Kenalog 40, 2 cc lidocaine, 2 cc bupivacaine injected easily Completed without difficulty  Advised to call if fevers/chills, erythema, induration, drainage, or persistent bleeding.  Images permanently stored and available for review in PACS.  Impression: Technically successful ultrasound guided injection.  Independent interpretation of notes and tests performed by another provider:   None.  Brief History, Exam, Impression, and Recommendations:    COVID-19 lung Cross was in the hospital for severe Covid with Covid pneumonia, respiratory failure, he was outside of the remdesivir window and was treated with Decadron. Overall his O2 saturation and requirement has improved considerably over time, he does have a portable oxygen concentrator that he uses for long walks but otherwise he is about  96% at rest. He continues to improve, he continues to also be resistant to the COVID-19 vaccine in spite of his near death experience. He understands that we will simply have to watch this for now. Clinically he is stable.  Primary osteoarthritis of both shoulders Bilateral glenohumeral injections, he continues to be disinterested in pain medication or surgery. Last injections were done October 22, 2019.  Annual physical exam Adding Cologuard.  Controlled type 2 diabetes mellitus without complication, without long-term current use of insulin (HCC) Doing well on Trulicity, we did discontinue glipizide, foot exam done today, referral for eye exam. I like to see him back in 3 6 months for an A1c.  Intertrigo Soreness and erythema in the intergluteal cleft. He will mix Desitin and topical Lotrisone. Return to see me in a month for this.    ___________________________________________ Gwen Her. Dianah Field, M.D., ABFM., CAQSM. Primary Care and [place] Instructor of [place] of [facility] of Medicine

## 2020-03-21 NOTE — Assessment & Plan Note (Signed)
Bilateral glenohumeral injections, he continues to be disinterested in pain medication or surgery. Last injections were done October 22, 2019.

## 2020-03-21 NOTE — Assessment & Plan Note (Signed)
Adding Cologuard.

## 2020-03-21 NOTE — Assessment & Plan Note (Signed)
Soreness and erythema in the intergluteal cleft. He will mix Desitin and topical Lotrisone. Return to see me in a month for this.

## 2020-03-21 NOTE — Assessment & Plan Note (Signed)
Doing well on Trulicity, we did discontinue glipizide, foot exam done today, referral for eye exam. I like to see him back in 3 6 months for an A1c.

## 2020-04-15 ENCOUNTER — Other Ambulatory Visit: Payer: Self-pay | Admitting: Sports Medicine

## 2020-04-25 ENCOUNTER — Ambulatory Visit: Payer: Medicare Other | Admitting: Sports Medicine

## 2020-05-13 ENCOUNTER — Other Ambulatory Visit: Payer: Self-pay | Admitting: Sports Medicine

## 2020-05-13 DIAGNOSIS — I480 Paroxysmal atrial fibrillation: Secondary | ICD-10-CM

## 2020-05-13 NOTE — Telephone Encounter (Signed)
Patient called stating that his new insurance gives his discounted rates if he gets 90 day supply of his medications.

## 2020-05-30 ENCOUNTER — Ambulatory Visit: Payer: Medicare Other | Admitting: Sports Medicine

## 2020-06-07 DIAGNOSIS — Z1211 Encounter for screening for malignant neoplasm of colon: Secondary | ICD-10-CM | POA: Diagnosis not present

## 2020-06-12 LAB — COLOGUARD
COLOGUARD: NEGATIVE
Cologuard: NEGATIVE

## 2020-06-14 ENCOUNTER — Encounter: Payer: Self-pay | Admitting: Sports Medicine

## 2020-06-14 ENCOUNTER — Telehealth: Payer: Self-pay

## 2020-06-14 NOTE — Progress Notes (Signed)
Left message on Vm with results. Advised to call office with questions/concerns.

## 2020-06-15 ENCOUNTER — Other Ambulatory Visit: Payer: Self-pay

## 2020-06-15 ENCOUNTER — Other Ambulatory Visit: Payer: Self-pay | Admitting: Sports Medicine

## 2020-06-15 DIAGNOSIS — E785 Hyperlipidemia, unspecified: Secondary | ICD-10-CM

## 2020-06-15 MED ORDER — TOPIRAMATE 100 MG PO TABS: 100.0000 mg | ORAL_TABLET | Freq: Two times a day (BID) | ORAL | 2 refills | Status: DC

## 2020-06-15 NOTE — Telephone Encounter (Signed)
Please remember that patient now needs 90 day supplies.

## 2020-06-15 NOTE — Addendum Note (Signed)
Addended by: Silverio Decamp on: 06/15/2020 04:15 PM   Modules accepted: Orders

## 2020-06-15 NOTE — Telephone Encounter (Signed)
Patient called and stated his new insurance is telling him he has to have 90 day supply on all maintenance meds.

## 2020-06-15 NOTE — Telephone Encounter (Signed)
Patient aware prescription sent to pharmacy. 

## 2020-06-16 MED ORDER — CARVEDILOL 25 MG PO TABS
ORAL_TABLET | ORAL | 2 refills | Status: DC
Start: 2020-06-16 — End: 2021-08-23

## 2020-06-16 MED ORDER — ROSUVASTATIN CALCIUM 40 MG PO TABS: 40.0000 mg | ORAL_TABLET | Freq: Every day | ORAL | 3 refills | Status: DC

## 2020-06-17 ENCOUNTER — Other Ambulatory Visit: Payer: Self-pay | Admitting: Sports Medicine

## 2020-06-17 DIAGNOSIS — E785 Hyperlipidemia, unspecified: Secondary | ICD-10-CM

## 2020-06-17 MED ORDER — ROSUVASTATIN CALCIUM 40 MG PO TABS
40.0000 mg | ORAL_TABLET | Freq: Every day | ORAL | 3 refills | Status: DC
Start: 2020-06-17 — End: 2020-09-16

## 2020-06-20 ENCOUNTER — Ambulatory Visit (INDEPENDENT_AMBULATORY_CARE_PROVIDER_SITE_OTHER): Payer: Medicare Other | Admitting: Sports Medicine

## 2020-06-20 ENCOUNTER — Other Ambulatory Visit: Payer: Self-pay

## 2020-06-20 ENCOUNTER — Encounter: Payer: Self-pay | Admitting: Sports Medicine

## 2020-06-20 ENCOUNTER — Ambulatory Visit (INDEPENDENT_AMBULATORY_CARE_PROVIDER_SITE_OTHER): Payer: Medicare Other

## 2020-06-20 VITALS — BP 125/73 | HR 63 | Ht 74.0 in | Wt 270.0 lb

## 2020-06-20 DIAGNOSIS — Z9889 Other specified postprocedural states: Secondary | ICD-10-CM | POA: Diagnosis not present

## 2020-06-20 DIAGNOSIS — N139 Obstructive and reflux uropathy, unspecified: Secondary | ICD-10-CM | POA: Diagnosis not present

## 2020-06-20 DIAGNOSIS — M19012 Primary osteoarthritis, left shoulder: Secondary | ICD-10-CM

## 2020-06-20 DIAGNOSIS — D751 Secondary polycythemia: Secondary | ICD-10-CM | POA: Diagnosis not present

## 2020-06-20 DIAGNOSIS — I82432 Acute embolism and thrombosis of left popliteal vein: Secondary | ICD-10-CM | POA: Diagnosis not present

## 2020-06-20 DIAGNOSIS — M19011 Primary osteoarthritis, right shoulder: Secondary | ICD-10-CM | POA: Diagnosis not present

## 2020-06-20 DIAGNOSIS — E119 Type 2 diabetes mellitus without complications: Secondary | ICD-10-CM | POA: Diagnosis not present

## 2020-06-20 DIAGNOSIS — Z8719 Personal history of other diseases of the digestive system: Secondary | ICD-10-CM

## 2020-06-20 NOTE — Assessment & Plan Note (Signed)
Bilateral glenohumeral injections today, last done 3 months ago.

## 2020-06-20 NOTE — Assessment & Plan Note (Addendum)
Rechecking routine labs. His current dose of Trulicity was too expensive, he would like to use one of the previous lower doses. We will do this if his A1c is uncontrolled.

## 2020-06-20 NOTE — Assessment & Plan Note (Signed)
Rich is feeling some uncomfortable movement of mesh placed for hernia repair superior to the umbilicus. I would like general surgery to weigh in.

## 2020-06-20 NOTE — Assessment & Plan Note (Signed)
Known chronic left popliteal DVT, on Coumadin with an INR goal of 2.5-3.5. See prior notes, rechecking INR today.

## 2020-06-20 NOTE — Progress Notes (Signed)
    Procedures performed today:    Procedure: Real-time Ultrasound Guided injection of the left glenohumeral joint Device: Samsung HS60  Verbal informed consent obtained.  Time-out conducted.  Noted no overlying erythema, induration, or other signs of local infection.  Skin prepped in a sterile fashion.  Local anesthesia: Topical Ethyl chloride.  With sterile technique and under real time ultrasound guidance:  Noted osteoarthritis, 1 cc Kenalog 40, 2 cc lidocaine, 2 cc bupivacaine injected easily Completed without difficulty  Advised to call if fevers/chills, erythema, induration, drainage, or persistent bleeding.  Images permanently stored and available for review in PACS.  Impression: Technically successful ultrasound guided injection.  Procedure: Real-time Ultrasound Guided injection of the right glenohumeral joint Device: Samsung HS60  Verbal informed consent obtained.  Time-out conducted.  Noted no overlying erythema, induration, or other signs of local infection.  Skin prepped in a sterile fashion.  Local anesthesia: Topical Ethyl chloride.  With sterile technique and under real time ultrasound guidance:  Noted osteoarthritis, 1 cc Kenalog 40, 2 cc lidocaine, 2 cc bupivacaine injected easily Completed without difficulty  Advised to call if fevers/chills, erythema, induration, drainage, or persistent bleeding.  Images permanently stored and available for review in PACS.  Impression: Technically successful ultrasound guided injection.  Independent interpretation of notes and tests performed by another provider:   None.  Brief History, Exam, Impression, and Recommendations:    Controlled type 2 diabetes mellitus without complication, without long-term current use of insulin ([place]) Rechecking routine labs. His current dose of Trulicity was too expensive, he would like to use one of the previous lower doses. We will do this if his A1c is uncontrolled.  Polycythemia,  secondary Tells me he has been taking iron, I do not really think this is entirely necessary, he will stop it, were also going to check his iron levels.  Primary osteoarthritis of both shoulders Bilateral glenohumeral injections today, last done 3 months ago.  H/O hernia repair [name] is feeling some uncomfortable movement of mesh placed for hernia repair superior to the umbilicus. I would like general surgery to weigh in.  DVT (deep venous thrombosis) (HCC) Known chronic left popliteal DVT, on Coumadin with an INR goal of 2.5-3.5. See prior notes, rechecking INR today.    ___________________________________________ Gwen Her. Dianah Field, M.D., ABFM., CAQSM. Primary Care and [place] Instructor of [place] of [facility] of Medicine

## 2020-06-20 NOTE — Assessment & Plan Note (Signed)
Tells me he has been taking iron, I do not really think this is entirely necessary, he will stop it, were also going to check his iron levels.

## 2020-06-23 DIAGNOSIS — E119 Type 2 diabetes mellitus without complications: Secondary | ICD-10-CM | POA: Diagnosis not present

## 2020-06-23 DIAGNOSIS — I82432 Acute embolism and thrombosis of left popliteal vein: Secondary | ICD-10-CM | POA: Diagnosis not present

## 2020-06-23 DIAGNOSIS — D751 Secondary polycythemia: Secondary | ICD-10-CM | POA: Diagnosis not present

## 2020-06-23 DIAGNOSIS — N139 Obstructive and reflux uropathy, unspecified: Secondary | ICD-10-CM | POA: Diagnosis not present

## 2020-06-24 LAB — COMPREHENSIVE METABOLIC PANEL
AG Ratio: 1.8 (calc) (ref 1.0–2.5)
ALT: 16 U/L (ref 9–46)
AST: 15 U/L (ref 10–35)
Albumin: 4.4 g/dL (ref 3.6–5.1)
Alkaline phosphatase (APISO): 49 U/L (ref 35–144)
BUN/Creatinine Ratio: 21 (calc) (ref 6–22)
BUN: 31 mg/dL — ABNORMAL HIGH (ref 7–25)
CO2: 24 mmol/L (ref 20–32)
Calcium: 10.4 mg/dL — ABNORMAL HIGH (ref 8.6–10.3)
Chloride: 106 mmol/L (ref 98–110)
Creat: 1.47 mg/dL — ABNORMAL HIGH (ref 0.70–1.25)
Globulin: 2.4 g/dL (calc) (ref 1.9–3.7)
Glucose, Bld: 175 mg/dL — ABNORMAL HIGH (ref 65–99)
Potassium: 4.3 mmol/L (ref 3.5–5.3)
Sodium: 139 mmol/L (ref 135–146)
Total Bilirubin: 0.5 mg/dL (ref 0.2–1.2)
Total Protein: 6.8 g/dL (ref 6.1–8.1)

## 2020-06-24 LAB — LIPID PANEL
Cholesterol: 174 mg/dL (ref <200)
HDL: 66 mg/dL (ref >=40)
LDL Cholesterol (Calc): 88 mg/dL (calc)
Non-HDL Cholesterol (Calc): 108 mg/dL (calc) (ref <130)
Total CHOL/HDL Ratio: 2.6 (calc) (ref <5.0)
Triglycerides: 104 mg/dL (ref <150)

## 2020-06-24 LAB — PROTIME-INR
INR: 2.3 — ABNORMAL HIGH
Prothrombin Time: 22.6 s — ABNORMAL HIGH (ref 9.0–11.5)

## 2020-06-24 LAB — HEMOGLOBIN A1C
Hgb A1c MFr Bld: 7.1 % of total Hgb — ABNORMAL HIGH (ref <5.7)
Mean Plasma Glucose: 157 mg/dL
eAG (mmol/L): 8.7 mmol/L

## 2020-06-24 LAB — IRON,TIBC AND FERRITIN PANEL
%SAT: 42 % (calc) (ref 20–48)
Ferritin: 141 ng/mL (ref 24–380)
Iron: 147 ug/dL (ref 50–180)
TIBC: 350 mcg/dL (calc) (ref 250–425)

## 2020-06-24 LAB — CBC
HCT: 44 % (ref 38.5–50.0)
Hemoglobin: 14.6 g/dL (ref 13.2–17.1)
MCH: 29.2 pg (ref 27.0–33.0)
MCHC: 33.2 g/dL (ref 32.0–36.0)
MCV: 88 fL (ref 80.0–100.0)
MPV: 13.6 fL — ABNORMAL HIGH (ref 7.5–12.5)
Platelets: 160 10*3/uL (ref 140–400)
RBC: 5 10*6/uL (ref 4.20–5.80)
RDW: 13.8 % (ref 11.0–15.0)
WBC: 8.6 10*3/uL (ref 3.8–10.8)

## 2020-06-24 LAB — PSA, TOTAL AND FREE
PSA, % Free: 33 % (calc) (ref >25)
PSA, Free: 0.1 ng/mL
PSA, Total: 0.3 ng/mL (ref <=4.0)

## 2020-06-24 LAB — TSH: TSH: 1.53 mIU/L (ref 0.40–4.50)

## 2020-07-18 ENCOUNTER — Telehealth (INDEPENDENT_AMBULATORY_CARE_PROVIDER_SITE_OTHER): Payer: Medicare Other | Admitting: Medical-Surgical

## 2020-07-18 ENCOUNTER — Encounter: Payer: Self-pay | Admitting: Medical-Surgical

## 2020-07-18 VITALS — BP 125/83 | HR 70 | Temp 97.5°F

## 2020-07-18 DIAGNOSIS — J0100 Acute maxillary sinusitis, unspecified: Secondary | ICD-10-CM | POA: Diagnosis not present

## 2020-07-18 MED ORDER — AMOXICILLIN-POT CLAVULANATE 875-125 MG PO TABS: 1.0000 | ORAL_TABLET | Freq: Two times a day (BID) | ORAL | 0 refills | Status: DC

## 2020-07-18 MED ORDER — BENZONATATE 200 MG PO CAPS: 200.0000 mg | ORAL_CAPSULE | Freq: Three times a day (TID) | ORAL | 0 refills | Status: DC | PRN

## 2020-07-18 MED ORDER — HYDROCODONE-HOMATROPINE 5-1.5 MG/5ML PO SYRP: 5.0000 mL | ORAL_SOLUTION | Freq: Every evening | ORAL | 0 refills | Status: DC | PRN

## 2020-07-18 NOTE — Progress Notes (Signed)
Virtual Visit via Telephone   I connected with  Jeff Green  on 07/18/20 by telephone/telehealth and verified that I am speaking with the correct person using two identifiers.   I discussed the limitations, risks, security and privacy concerns of performing an evaluation and management service by telephone, including the higher likelihood of inaccurate diagnosis and treatment, and the availability of in person appointments.  We also discussed the likely need of an additional face to face encounter for complete and high quality delivery of care.  I also discussed with the patient that there may be a patient responsible charge related to this service. The patient expressed understanding and wishes to proceed.  Provider location is in medical facility. Patient location is at their home, different from provider location. People involved in care of the patient during this telehealth encounter were myself, my nurse/medical assistant, and my front office/scheduling team member.  CC: Sinus symptoms  HPI: Pleasant 65 year old male presenting via telephone after several unsuccessful attempts to connect via MyChart video visit reports approximately 2-2.5 weeks of upper respiratory symptoms.  Originally started out with coughing spells, nasal congestion, watery eyes, and postnasal drip.  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  Unfortunately his symptoms have continued to worsen and now he has a cough productive of thick green mucus, yellowish-green nasal discharge, fatigue, decreased appetite, and chest heaviness.  He has been taking Mucinex daytime/nighttime and drinking tea with ginger and lemon.  Denies fever, chills, chest pain, and GI symptoms.  He did have Covid approximately 6 months ago requiring 2 to 3 weeks of inpatient treatment as well as post discharge physical therapy and home oxygen.  He has been able to wean off of the  oxygen and is now at 96% on room air.  Review of Systems: See HPI for pertinent positives and negatives.   Objective Findings:    General: Speaking full sentences, no audible heavy breathing.  Sounds alert and appropriately interactive.    Impression and Recommendations:    1. Acute non-recurrent maxillary sinusitis Augmentin twice daily x7 days.  Hycodan cough syrup at bedtime as needed.  Tessalon Perles for use during the day.  Has an albuterol inhaler at home so advised to use this for chest tightness/wheezing/shortness of breath.  Get plenty of fluids.  Continue Mucinex if desired for symptom management.  I discussed the above assessment and treatment plan with the patient. The patient was provided an opportunity to ask questions and all were answered. The patient agreed with the plan and demonstrated an understanding of the instructions.   The patient was advised to call back or seek an in-person evaluation if the symptoms worsen or if the condition fails to improve as anticipated.  20 minutes of non-face-to-face time was provided during this encounter.  Return if symptoms worsen or fail to improve. ___________________________________________ Samuel Bouche, DNP, APRN, FNP-BC Primary Care and [place]

## 2020-07-21 ENCOUNTER — Other Ambulatory Visit: Payer: Self-pay | Admitting: Sports Medicine

## 2020-08-12 ENCOUNTER — Other Ambulatory Visit: Payer: Self-pay | Admitting: Sports Medicine

## 2020-08-12 DIAGNOSIS — E785 Hyperlipidemia, unspecified: Secondary | ICD-10-CM

## 2020-08-24 ENCOUNTER — Other Ambulatory Visit: Payer: Self-pay | Admitting: Sports Medicine

## 2020-08-24 DIAGNOSIS — I480 Paroxysmal atrial fibrillation: Secondary | ICD-10-CM

## 2020-09-05 ENCOUNTER — Telehealth: Payer: Self-pay

## 2020-09-05 NOTE — Telephone Encounter (Signed)
Pt called and stated that on Thursday he  "twisted his knee" while walking up the stairs with his dog. Pt has appointment for 09/06/20.

## 2020-09-06 ENCOUNTER — Ambulatory Visit (INDEPENDENT_AMBULATORY_CARE_PROVIDER_SITE_OTHER): Payer: Medicare Other | Admitting: Sports Medicine

## 2020-09-06 ENCOUNTER — Other Ambulatory Visit: Payer: Self-pay

## 2020-09-06 ENCOUNTER — Encounter: Payer: Self-pay | Admitting: Sports Medicine

## 2020-09-06 ENCOUNTER — Ambulatory Visit: Payer: Self-pay

## 2020-09-06 ENCOUNTER — Ambulatory Visit (INDEPENDENT_AMBULATORY_CARE_PROVIDER_SITE_OTHER): Payer: Medicare Other

## 2020-09-06 DIAGNOSIS — M19011 Primary osteoarthritis, right shoulder: Secondary | ICD-10-CM

## 2020-09-06 DIAGNOSIS — M7989 Other specified soft tissue disorders: Secondary | ICD-10-CM | POA: Diagnosis not present

## 2020-09-06 DIAGNOSIS — S8991XA Unspecified injury of right lower leg, initial encounter: Secondary | ICD-10-CM

## 2020-09-06 DIAGNOSIS — Z09 Encounter for follow-up examination after completed treatment for conditions other than malignant neoplasm: Secondary | ICD-10-CM | POA: Diagnosis not present

## 2020-09-06 DIAGNOSIS — M19012 Primary osteoarthritis, left shoulder: Secondary | ICD-10-CM

## 2020-09-06 DIAGNOSIS — M1711 Unilateral primary osteoarthritis, right knee: Secondary | ICD-10-CM | POA: Insufficient documentation

## 2020-09-06 DIAGNOSIS — M1712 Unilateral primary osteoarthritis, left knee: Secondary | ICD-10-CM | POA: Insufficient documentation

## 2020-09-06 NOTE — Assessment & Plan Note (Signed)
Increasing bilateral shoulder pain, last done end of February. Its not been quite 3 months but we will do his right shoulder today.

## 2020-09-06 NOTE — Progress Notes (Signed)
    Procedures performed today:    Procedure: Real-time Ultrasound Guided injection of the right glenohumeral joint Device: Samsung HS60  Verbal informed consent obtained.  Time-out conducted.  Noted no overlying erythema, induration, or other signs of local infection.  Skin prepped in a sterile fashion.  Local anesthesia: Topical Ethyl chloride.  With sterile technique and under real time ultrasound guidance:  Noted osteoarthritic joint, 1 cc Kenalog 40, 2 cc lidocaine, 2 cc bupivacaine injected easily Completed without difficulty  Advised to call if fevers/chills, erythema, induration, drainage, or persistent bleeding.  Images permanently stored and available for review in PACS.  Impression: Technically successful ultrasound guided injection.  Procedure: Real-time Ultrasound Guided  aspiration/injection of right knee Device: Samsung HS60  Verbal informed consent obtained.  Time-out conducted.  Noted no overlying erythema, induration, or other signs of local infection.  Skin prepped in a sterile fashion.  Local anesthesia: Topical Ethyl chloride.  With sterile technique and under real time ultrasound guidance:  I advanced an 18-gauge needle into the suprapatellar recess and aspirated 34 mL of serosanguineous fluid, syringe switched and 1 cc Kenalog 40, 2 cc lidocaine, 2 cc bupivacaine injected easily Completed without difficulty  Advised to call if fevers/chills, erythema, induration, drainage, or persistent bleeding.  Images permanently stored and available for review in PACS.  Impression: Technically successful ultrasound guided injection.  Independent interpretation of notes and tests performed by another provider:   None.  Brief History, Exam, Impression, and Recommendations:    Primary osteoarthritis of both shoulders Increasing bilateral shoulder pain, last done end of February. Its not been quite 3 months but we will do his right shoulder today.   Right knee  injury Walking dog, took a misstep, axially loaded the right knee, immediate swelling. He has an effusion today, but really not much tenderness to palpation. Aspiration and injection, x-rays, return in a month.    ___________________________________________ Gwen Her. Dianah Field, M.D., ABFM., CAQSM. Primary Care and [place] Instructor of [place] of [facility] of Medicine

## 2020-09-06 NOTE — Assessment & Plan Note (Signed)
Walking dog, took a misstep, axially loaded the right knee, immediate swelling. He has an effusion today, but really not much tenderness to palpation. Aspiration and injection, x-rays, return in a month.

## 2020-09-08 ENCOUNTER — Telehealth: Payer: Self-pay | Admitting: Sports Medicine

## 2020-09-08 NOTE — Chronic Care Management (AMB) (Signed)
  Chronic Care Management   Outreach Note  09/08/2020 Name: Ekin Pilar MRN: [id number] DOB: Nov 10, 1955  Referred by: Silverio Decamp, MD Reason for referral : No chief complaint on file.   An unsuccessful telephone outreach was attempted today. The patient was referred to the pharmacist for assistance with care management and care coordination.   Follow Up Plan:   Lauretta Grill Upstream Scheduler

## 2020-09-09 ENCOUNTER — Telehealth: Payer: Self-pay | Admitting: Sports Medicine

## 2020-09-09 NOTE — Progress Notes (Signed)
  Chronic Care Management   Note  09/09/2020 Name: Kwinton Maahs MRN: [id number] DOB: 13-May-1955  Raphel Stickles is a 65 y.o. year old male who is a primary care patient of Dianah Field, Gwen Her, MD. I reached out to [name] by phone today in response to a referral sent by Mr. Lanier Clam PCP, Silverio Decamp, MD.   Mr. Naramore was given information about Chronic Care Management services today including:  1. CCM service includes personalized support from designated clinical staff supervised by his physician, including individualized plan of care and coordination with other care providers 2. 24/7 contact phone numbers for assistance for urgent and routine care needs. 3. Service will only be billed when office clinical staff spend 20 minutes or more in a month to coordinate care. 4. Only one practitioner may furnish and bill the service in a calendar month. 5. The patient may stop CCM services at any time (effective at the end of the month) by phone call to the office staff.   Patient did not agree to enrollment in care management services and does not wish to consider at this time.  Follow up plan:   Lauretta Grill Upstream Scheduler

## 2020-09-16 ENCOUNTER — Ambulatory Visit (INDEPENDENT_AMBULATORY_CARE_PROVIDER_SITE_OTHER): Payer: Medicare Other | Admitting: Sports Medicine

## 2020-09-16 ENCOUNTER — Ambulatory Visit (INDEPENDENT_AMBULATORY_CARE_PROVIDER_SITE_OTHER): Payer: Medicare Other

## 2020-09-16 ENCOUNTER — Other Ambulatory Visit: Payer: Self-pay

## 2020-09-16 DIAGNOSIS — M19011 Primary osteoarthritis, right shoulder: Secondary | ICD-10-CM | POA: Diagnosis not present

## 2020-09-16 DIAGNOSIS — M19012 Primary osteoarthritis, left shoulder: Secondary | ICD-10-CM

## 2020-09-16 DIAGNOSIS — E785 Hyperlipidemia, unspecified: Secondary | ICD-10-CM | POA: Diagnosis not present

## 2020-09-16 MED ORDER — ROSUVASTATIN CALCIUM 40 MG PO TABS: 40.0000 mg | ORAL_TABLET | Freq: Every day | ORAL | 3 refills | Status: DC

## 2020-09-16 NOTE — Progress Notes (Signed)
    Procedures performed today:    Procedure: Real-time Ultrasound Guided injection of the left glenohumeral joint Device: Samsung HS60  Verbal informed consent obtained.  Time-out conducted.  Noted no overlying erythema, induration, or other signs of local infection.  Skin prepped in a sterile fashion.  Local anesthesia: Topical Ethyl chloride.  With sterile technique and under real time ultrasound guidance:  Noted arthritic joint, 1 cc Kenalog 40, 2 cc lidocaine, 2 cc bupivacaine injected easily Completed without difficulty  Advised to call if fevers/chills, erythema, induration, drainage, or persistent bleeding.  Images permanently stored and available for review in PACS.  Impression: Technically successful ultrasound guided injection.  Independent interpretation of notes and tests performed by another provider:   None.  Brief History, Exam, Impression, and Recommendations:    Primary osteoarthritis of both shoulders Right glenohumeral joint injected on the 17th of this month, recurrence of pain in the left, injected glenohumeral joint today. This is a chronic process with exacerbation of pain and pharmacologic management. Return as needed.    ___________________________________________ Gwen Her. Dianah Field, M.D., ABFM., CAQSM. Primary Care and [place] Instructor of [place] of [facility] of Medicine

## 2020-09-16 NOTE — Assessment & Plan Note (Addendum)
Right glenohumeral joint injected on the 17th of this month, recurrence of pain in the left, injected glenohumeral joint today. This is a chronic process with exacerbation of pain and pharmacologic management. Return as needed.

## 2020-09-21 ENCOUNTER — Other Ambulatory Visit: Payer: Self-pay

## 2020-09-21 ENCOUNTER — Ambulatory Visit (INDEPENDENT_AMBULATORY_CARE_PROVIDER_SITE_OTHER): Payer: Medicare Other | Admitting: Physician Assistant

## 2020-09-21 VITALS — BP 138/83 | HR 66 | Ht 74.0 in | Wt 257.0 lb

## 2020-09-21 DIAGNOSIS — R21 Rash and other nonspecific skin eruption: Secondary | ICD-10-CM | POA: Diagnosis not present

## 2020-09-21 MED ORDER — VALACYCLOVIR HCL 1 G PO TABS: 1000.0000 mg | ORAL_TABLET | Freq: Three times a day (TID) | ORAL | 0 refills | Status: DC

## 2020-09-21 NOTE — Progress Notes (Signed)
   Subjective:    Patient ID: Jeff Green, male    DOB: 08/30/1955, 65 y.o.   MRN: [id number]  HPI  Pt is a 65 yo male who presents to the clinic with burning rash of the right neck and upper chest wall. He was outside all weekend. Noticed the rash Sunday. Used some mineral oil on skin the day before. No new lotions, creams, soaps. No itching. No fever, chills, headaches, nausea or vomiting. The rash "feels like razor burn". He has not been vaccinated for shingles.   .. Active Ambulatory Problems    Diagnosis Date Noted  . Hyperlipidemia 08/10/2014  . Hypogonadism in male 08/10/2014  . Essential hypertension 08/10/2014  . Spinal stenosis of lumbar region 08/10/2014  . History of pulmonary embolism 08/10/2014  . CAD (coronary artery disease), native coronary artery 08/10/2014  . Testicular cancer ([place]) 08/12/2014  . Multiple pulmonary nodules 08/16/2014  . Polycythemia, secondary 08/26/2014  . Hemochromatosis 08/26/2014  . Paroxysmal atrial fibrillation ([place]) 06/15/2015  . Annual physical exam 01/25/2016  . Ruptured tympanic membrane, bilateral 01/27/2016  . Controlled type 2 diabetes mellitus without complication, without long-term current use of insulin ([place]) 06/08/2016  . Seborrheic dermatitis 06/08/2016  . Morbid obesity ([place]) 06/08/2016  . Primary osteoarthritis of both shoulders 11/28/2016  . OSA (obstructive sleep apnea) 03/21/2017  . Insomnia 04/05/2017  . Seborrheic keratosis 05/29/2017  . Renal insufficiency 06/27/2018  . DVT (deep venous thrombosis) ([place]) 07/29/2018  . Hypercalcemia due to a drug 07/30/2018  . Presence of IVC filter 08/06/2018  . Thrombocytopenia ([place]) 08/12/2018  . Liver lesion 08/12/2018  . Lung nodule 08/12/2018  . COVID-19 lung 01/20/2020  . Acute irritant rhinitis 02/10/2020  . Intertrigo 03/21/2020  . H/O hernia repair 06/20/2020  . Right knee injury 09/06/2020   Resolved Ambulatory Problems    Diagnosis Date Noted  . Prediabetes  08/10/2014  . Post laminectomy syndrome 08/10/2014  . Right knee pain 08/26/2014  . Long term current use of anticoagulant therapy 01/28/2015  . Acute bronchitis 04/29/2015  . Skin tag 01/11/2016   Past Medical History:  Diagnosis Date  . Atrial fibrillation ([place])   . CAD (coronary artery disease)   . Congestive heart failure (CHF) ([place])   . DVT (deep vein thrombosis) in pregnancy   . High cholesterol   . Hypertension   . Pulmonary embolus ([facility])      Review of Systems See HPI>     Objective:   Physical Exam  Right neck and upper chest wall  Erythematous patch well demarcated borders and appearance of tiny vesicles.   Same rash posterior neck and upper back in clusters.       Assessment & Plan:  Marland KitchenMarland KitchenRichard was seen today for rash.  Diagnoses and all orders for this visit:  Rash -     Varicella Zoster,Rapid Method,Culture -     valACYclovir (VALTREX) 1000 MG tablet; Take 1 tablet (1,000 mg total) by mouth 3 (three) times daily for 7 days.  Other orders -     Discontinue: valACYclovir (VALTREX) 1000 MG tablet; Take 1 tablet (1,000 mg total) by mouth 3 (three) times daily for 10 days.   Burning rash of one dermatome and patient has not been vaccinated for shingles. Culture order of one vesicles. Start valtrex. Pain well controlled. Ok for cool compresses and ibuprofen as needed. Follow up if pain or symptoms worsening.

## 2020-09-21 NOTE — Patient Instructions (Signed)
Shingles  Shingles, which is also known as herpes zoster, is an infection that causes a painful skin rash and fluid-filled blisters. It is caused by a virus. Shingles only develops in people who:  Have had chickenpox.  Have been given a medicine to protect against chickenpox (have been vaccinated). Shingles is rare in this group. What are the causes? Shingles is caused by varicella-zoster virus (VZV). This is the same virus that causes chickenpox. After a person is exposed to VZV, the virus stays in the body in an inactive (dormant) state. Shingles develops if the virus is reactivated. This can happen many years after the first (initial) exposure to VZV. It is not known what causes this virus to be reactivated. What increases the risk? People who have had chickenpox or received the chickenpox vaccine are at risk for shingles. Shingles infection is more common in people who:  Are older than age 46.  Have a weakened disease-fighting system (immune system), such as people with: ? HIV. ? AIDS. ? Cancer.  Are taking medicines that weaken the immune system, such as transplant medicines.  Are experiencing a lot of stress. What are the signs or symptoms? Early symptoms of this condition include itching, tingling, and pain in an area on your skin. Pain may be described as burning, stabbing, or throbbing. A few days or weeks after early symptoms start, a painful red rash appears. The rash is usually on one side of the body and has a band-like or belt-like pattern. The rash eventually turns into fluid-filled blisters that break open, change into scabs, and dry up in about 2-3 weeks. At any time during the infection, you may also develop:  A fever.  Chills.  A headache.  An upset stomach. How is this diagnosed? This condition is diagnosed with a skin exam. Skin or fluid samples may be taken from the blisters before a diagnosis is made. These samples are examined under a microscope or sent to  a lab for testing. How is this treated? The rash may last for several weeks. There is not a specific cure for this condition. Your health care provider will probably prescribe medicines to help you manage pain, recover more quickly, and avoid long-term problems. Medicines may include:  Antiviral drugs.  Anti-inflammatory drugs.  Pain medicines.  Anti-itching medicines (antihistamines). If the area involved is on your face, you may be referred to a specialist, such as an eye doctor (ophthalmologist) or an ear, nose, and throat (ENT) doctor (otolaryngologist) to help you avoid eye problems, chronic pain, or disability. Follow these instructions at home: Medicines  Take over-the-counter and prescription medicines only as told by your health care provider.  Apply an anti-itch cream or numbing cream to the affected area as told by your health care provider. Relieving itching and discomfort  Apply cold, wet cloths (cold compresses) to the area of the rash or blisters as told by your health care provider.  Cool baths can be soothing. Try adding baking soda or dry oatmeal to the water to reduce itching. Do not bathe in hot water.   Blister and rash care  Keep your rash covered with a loose bandage (dressing). Wear loose-fitting clothing to help ease the pain of material rubbing against the rash.  Keep your rash and blisters clean by washing the area with mild soap and cool water as told by your health care provider.  Check your rash every day for signs of infection. Check for: ? More redness, swelling, or pain. ?  Fluid or blood. ? Warmth. ? Pus or a bad smell.  Do not scratch your rash or pick at your blisters. To help avoid scratching: ? Keep your fingernails clean and cut short. ? Wear gloves or mittens while you sleep, if scratching is a problem. General instructions  Rest as told by your health care provider.  Keep all follow-up visits as told by your health care provider. This  is important.  Wash your hands often with soap and water. If soap and water are not available, use hand sanitizer. Doing this lowers your chance of getting a bacterial skin infection.  Before your blisters change into scabs, your shingles infection can cause chickenpox in people who have never had it or have never been vaccinated against it. To prevent this from happening, avoid contact with other people, especially: ? Babies. ? Pregnant women. ? Children who have eczema. ? Elderly people who have transplants. ? People who have chronic illnesses, such as cancer or AIDS. Contact a health care provider if:  Your pain is not relieved with prescribed medicines.  Your pain does not get better after the rash heals.  You have signs of infection in the rash area, such as: ? More redness, swelling, or pain around the rash. ? Fluid or blood coming from the rash. ? The rash area feeling warm to the touch. ? Pus or a bad smell coming from the rash. Get help right away if:  The rash is on your face or nose.  You have facial pain, pain around your eye area, or loss of feeling on one side of your face.  You have difficulty seeing.  You have ear pain or have ringing in your ear.  You have a loss of taste.  Your condition gets worse. Summary  Shingles, which is also known as herpes zoster, is an infection that causes a painful skin rash and fluid-filled blisters.  This condition is diagnosed with a skin exam. Skin or fluid samples may be taken from the blisters and examined before the diagnosis is made.  Keep your rash covered with a loose bandage (dressing). Wear loose-fitting clothing to help ease the pain of material rubbing against the rash.  Before your blisters change into scabs, your shingles infection can cause chickenpox in people who have never had it or have never been vaccinated against it. This information is not intended to replace advice given to you by your health care  provider. Make sure you discuss any questions you have with your health care provider. Document Revised: 08/01/2018 Document Reviewed: 12/12/2016 Elsevier Patient Education  2021 [employer].

## 2020-09-22 MED ORDER — VALACYCLOVIR HCL 1 G PO TABS
1000.0000 mg | ORAL_TABLET | Freq: Three times a day (TID) | ORAL | 0 refills | Status: AC
Start: 2020-09-22 — End: 2020-09-29

## 2020-09-23 ENCOUNTER — Encounter: Payer: Self-pay | Admitting: Physician Assistant

## 2020-09-26 ENCOUNTER — Other Ambulatory Visit: Payer: Self-pay | Admitting: Neurology

## 2020-09-26 DIAGNOSIS — D751 Secondary polycythemia: Secondary | ICD-10-CM

## 2020-09-26 LAB — VARICELLA ZOSTER VIRUS,RAPID METHOD,CULTURE

## 2020-09-26 NOTE — Progress Notes (Signed)
Make sure patient is called. Test was not frozen. Make sure he is not charged.

## 2020-09-26 NOTE — Progress Notes (Signed)
Not sure if insurance will pay for b12 but we can order.

## 2020-09-27 DIAGNOSIS — D751 Secondary polycythemia: Secondary | ICD-10-CM | POA: Diagnosis not present

## 2020-09-27 DIAGNOSIS — E538 Deficiency of other specified B group vitamins: Secondary | ICD-10-CM | POA: Diagnosis not present

## 2020-09-28 LAB — VITAMIN B12: Vitamin B-12: 795 pg/mL (ref 200–1100)

## 2020-09-28 NOTE — Progress Notes (Signed)
B12 is perfect!

## 2020-12-03 ENCOUNTER — Other Ambulatory Visit: Payer: Self-pay | Admitting: Sports Medicine

## 2020-12-03 DIAGNOSIS — I480 Paroxysmal atrial fibrillation: Secondary | ICD-10-CM

## 2020-12-06 NOTE — Telephone Encounter (Signed)
Opened in error

## 2020-12-19 ENCOUNTER — Other Ambulatory Visit: Payer: Self-pay

## 2020-12-19 ENCOUNTER — Ambulatory Visit: Payer: Self-pay

## 2020-12-19 ENCOUNTER — Ambulatory Visit (INDEPENDENT_AMBULATORY_CARE_PROVIDER_SITE_OTHER): Payer: Medicare Other | Admitting: Sports Medicine

## 2020-12-19 ENCOUNTER — Ambulatory Visit (INDEPENDENT_AMBULATORY_CARE_PROVIDER_SITE_OTHER): Payer: Medicare Other

## 2020-12-19 DIAGNOSIS — B029 Zoster without complications: Secondary | ICD-10-CM | POA: Diagnosis not present

## 2020-12-19 DIAGNOSIS — M1711 Unilateral primary osteoarthritis, right knee: Secondary | ICD-10-CM | POA: Diagnosis not present

## 2020-12-19 DIAGNOSIS — M19011 Primary osteoarthritis, right shoulder: Secondary | ICD-10-CM

## 2020-12-19 DIAGNOSIS — M19012 Primary osteoarthritis, left shoulder: Secondary | ICD-10-CM

## 2020-12-19 NOTE — Assessment & Plan Note (Signed)
Jeff Green did get shingles at his last visit, he was successfully treated with Valtrex. He has an anniversary weekend coming up and so needs his knee and shoulder injected. I want a separate the steroid injections and his vaccines by at least a month or 2 so I would like to have him come back maybe in 2 months to start his shingles series in a nurse visit.

## 2020-12-19 NOTE — Assessment & Plan Note (Addendum)
Known severe osteoarthritis. Last injection was 3 months ago, trace effusion, injected today, follow-up treatment will be to initiate viscosupplementation. He is having some mechanical symptoms, if these continue we will proceed with MRI.

## 2020-12-19 NOTE — Assessment & Plan Note (Signed)
Repeat right glenohumeral injection, worsening pain, chronic process. Last right-sided and left injections were 3 months ago.

## 2020-12-19 NOTE — Progress Notes (Addendum)
    Procedures performed today:    Procedure: Real-time Ultrasound Guided injection of right knee Device: Samsung HS60  Verbal informed consent obtained.  Time-out conducted.  Noted no overlying erythema, induration, or other signs of local infection.  Skin prepped in a sterile fashion.  Local anesthesia: Topical Ethyl chloride.  With sterile technique and under real time ultrasound guidance: Noted effusion, 1 cc Kenalog 40, 2 cc lidocaine, 2 cc bupivacaine injected easily Completed without difficulty  Advised to call if fevers/chills, erythema, induration, drainage, or persistent bleeding.  Images permanently stored and available for review in PACS.  Impression: Technically successful ultrasound guided injection.  Procedure: Real-time Ultrasound Guided injection of the right glenohumeral joint Device: Samsung HS60  Verbal informed consent obtained.  Time-out conducted.  Noted no overlying erythema, induration, or other signs of local infection.  Skin prepped in a sterile fashion.  Local anesthesia: Topical Ethyl chloride.  With sterile technique and under real time ultrasound guidance: Noted arthritic joint, I advanced a 22-gauge spinal needle into the joint from a posterior approach, 1 cc Kenalog 40, 2 cc lidocaine, 2 cc bupivacaine injected easily Completed without difficulty  Advised to call if fevers/chills, erythema, induration, drainage, or persistent bleeding.  Images permanently stored and available for review in PACS.  Impression: Technically successful ultrasound guided injection.  Independent interpretation of notes and tests performed by another provider:   None.  Brief History, Exam, Impression, and Recommendations:    Primary osteoarthritis of right knee Known severe osteoarthritis. Last injection was 3 months ago, trace effusion, injected today, follow-up treatment will be to initiate viscosupplementation. He is having some mechanical symptoms, if these continue  we will proceed with MRI.  Primary osteoarthritis of both shoulders Repeat right glenohumeral injection, worsening pain, chronic process. Last right-sided and left injections were 3 months ago.  Shingles Rich did get shingles at his last visit, he was successfully treated with Valtrex. He has an anniversary weekend coming up and so needs his knee and shoulder injected. I want a separate the steroid injections and his vaccines by at least a month or 2 so I would like to have him come back maybe in 2 months to start his shingles series in a nurse visit.    ___________________________________________ Gwen Her. Dianah Field, M.D., ABFM., CAQSM. Primary Care and [place] Instructor of [place] of [facility] of Medicine

## 2020-12-23 ENCOUNTER — Ambulatory Visit (INDEPENDENT_AMBULATORY_CARE_PROVIDER_SITE_OTHER): Payer: Medicare Other

## 2020-12-23 ENCOUNTER — Other Ambulatory Visit: Payer: Self-pay

## 2020-12-23 ENCOUNTER — Ambulatory Visit (INDEPENDENT_AMBULATORY_CARE_PROVIDER_SITE_OTHER): Payer: Medicare Other | Admitting: Sports Medicine

## 2020-12-23 DIAGNOSIS — M19011 Primary osteoarthritis, right shoulder: Secondary | ICD-10-CM

## 2020-12-23 DIAGNOSIS — M19012 Primary osteoarthritis, left shoulder: Secondary | ICD-10-CM

## 2020-12-23 NOTE — Assessment & Plan Note (Signed)
Right glenohumeral injection was done several days ago, left-sided glenohumeral injection today, return to see me as needed.

## 2020-12-23 NOTE — Progress Notes (Signed)
    Procedures performed today:    Procedure: Real-time Ultrasound Guided injection of the left glenohumeral joint Device: Samsung HS60  Verbal informed consent obtained.  Time-out conducted.  Noted no overlying erythema, induration, or other signs of local infection.  Skin prepped in a sterile fashion.  Local anesthesia: Topical Ethyl chloride.  With sterile technique and under real time ultrasound guidance: Noted arthritic joint, 1 cc Kenalog 40, 2 cc lidocaine, 2 cc bupivacaine injected easily Completed without difficulty  Advised to call if fevers/chills, erythema, induration, drainage, or persistent bleeding.  Images permanently stored and available for review in PACS.  Impression: Technically successful ultrasound guided injection.  Independent interpretation of notes and tests performed by another provider:   None.  Brief History, Exam, Impression, and Recommendations:    Primary osteoarthritis of both shoulders Right glenohumeral injection was done several days ago, left-sided glenohumeral injection today, return to see me as needed.  Chronic process with exacerbation and pharmacologic management  ___________________________________________ Gwen Her. Dianah Field, M.D., ABFM., CAQSM. Primary Care and [place] Instructor of [place] of [facility] of Medicine

## 2021-01-18 DIAGNOSIS — H2513 Age-related nuclear cataract, bilateral: Secondary | ICD-10-CM | POA: Diagnosis not present

## 2021-02-17 ENCOUNTER — Ambulatory Visit (INDEPENDENT_AMBULATORY_CARE_PROVIDER_SITE_OTHER): Payer: Medicare Other | Admitting: Sports Medicine

## 2021-02-17 ENCOUNTER — Other Ambulatory Visit: Payer: Self-pay

## 2021-02-17 ENCOUNTER — Ambulatory Visit: Payer: Medicare Other

## 2021-02-17 DIAGNOSIS — Z23 Encounter for immunization: Secondary | ICD-10-CM | POA: Diagnosis not present

## 2021-02-17 DIAGNOSIS — M25552 Pain in left hip: Secondary | ICD-10-CM | POA: Diagnosis not present

## 2021-02-17 DIAGNOSIS — Z0000 Encounter for general adult medical examination without abnormal findings: Secondary | ICD-10-CM

## 2021-02-17 MED ORDER — METHYLPREDNISOLONE SODIUM SUCC 125 MG IJ SOLR
125.0000 mg | Freq: Once | INTRAMUSCULAR | Status: AC
  Administered 2021-02-17: 125 mg via INTRAMUSCULAR

## 2021-02-17 MED ORDER — TRAMADOL HCL 50 MG PO TABS: 50.0000 mg | ORAL_TABLET | Freq: Three times a day (TID) | ORAL | 0 refills | Status: DC | PRN

## 2021-02-17 NOTE — Addendum Note (Signed)
Addended by: Gust Brooms on: 02/17/2021 11:33 AM   Modules accepted: Orders

## 2021-02-17 NOTE — Assessment & Plan Note (Addendum)
Shingrix today, return in 2 to 6 months nurse visit for Shingrix No. 2.

## 2021-02-17 NOTE — Progress Notes (Signed)
    Procedures performed today:    None.  Independent interpretation of notes and tests performed by another provider:   None.  Brief History, Exam, Impression, and Recommendations:    Left hip pain Khaleed is a very pleasant 65 year old male, he is having recurrence of left hip pain, localizes in the groin, this is likely hip osteoarthritis, he does have minimal reproduction of pain with internal rotation. X-rays a couple years ago were for the most part unremarkable, we did Solu-Medrol 125 intramuscular and that seem to work well so I am happy to repeat that today. Also adding some tramadol, return to see me as needed, formal physical therapy as well.  Annual physical exam Shingrix today, return in 2 to 6 months nurse visit for Shingrix No. 2.  Chronic process with exacerbation and pharmacologic intervention  ___________________________________________ Gwen Her. Dianah Field, M.D., ABFM., CAQSM. Primary Care and [place] Instructor of [place] of [facility] of Medicine

## 2021-02-17 NOTE — Assessment & Plan Note (Addendum)
Jeff Green is a very pleasant 65 year old male, he is having recurrence of left hip pain, localizes in the groin, this is likely hip osteoarthritis, he does have minimal reproduction of pain with internal rotation. X-rays a couple years ago were for the most part unremarkable, we did Solu-Medrol 125 intramuscular and that seem to work well so I am happy to repeat that today. Also adding some tramadol, return to see me as needed, formal physical therapy as well.

## 2021-02-24 ENCOUNTER — Telehealth (INDEPENDENT_AMBULATORY_CARE_PROVIDER_SITE_OTHER): Payer: Medicare Other | Admitting: Sports Medicine

## 2021-02-24 DIAGNOSIS — U071 COVID-19: Secondary | ICD-10-CM

## 2021-02-24 DIAGNOSIS — J1282 Pneumonia due to coronavirus disease 2019: Secondary | ICD-10-CM | POA: Diagnosis not present

## 2021-02-24 DIAGNOSIS — M25552 Pain in left hip: Secondary | ICD-10-CM | POA: Diagnosis not present

## 2021-02-24 MED ORDER — PREDNISONE 50 MG PO TABS: ORAL_TABLET | ORAL | 0 refills | Status: DC

## 2021-02-24 MED ORDER — HYDROCOD POLST-CPM POLST ER 10-8 MG/5ML PO SUER: 5.0000 mL | Freq: Two times a day (BID) | ORAL | 0 refills | Status: DC | PRN

## 2021-02-24 MED ORDER — TRAMADOL HCL 50 MG PO TABS: 50.0000 mg | ORAL_TABLET | Freq: Three times a day (TID) | ORAL | 0 refills | Status: DC | PRN

## 2021-02-24 MED ORDER — AZITHROMYCIN 250 MG PO TABS
ORAL_TABLET | ORAL | 0 refills | Status: DC
Start: 2021-02-24 — End: 2021-03-21

## 2021-02-24 NOTE — Progress Notes (Signed)
Monday woke up with congestion that went right to his chest. Tried Dayquil with no relief and hot tea with lemon. Coughing when he lays down, sleeping in the recliner. COVID test negative.

## 2021-02-24 NOTE — Assessment & Plan Note (Signed)
Hip osteoarthritis, minimal reproduction of pain with internal rotation of the hip. We did a Solu-Medrol intramuscular injection back on 28 October. Still having pain so we probably need to try 1 into the hip joint itself, he will call and make an appointment. Tramadol for pain in the meantime, he understands not to take tramadol and Tussionex on the same day.

## 2021-02-24 NOTE — Progress Notes (Signed)
   Virtual Visit via Telephone   I connected with  Jeff Green  on 02/24/21 by telephone/telehealth and verified that I am speaking with the correct person using two identifiers.   I discussed the limitations, risks, security and privacy concerns of performing an evaluation and management service by telephone, including the higher likelihood of inaccurate diagnosis and treatment, and the availability of in person appointments.  We also discussed the likely need of an additional face to face encounter for complete and high quality delivery of care.  I also discussed with the patient that there may be a patient responsible charge related to this service. The patient expressed understanding and wishes to proceed.  Provider location is in medical facility. Patient location is at their home, different from provider location. People involved in care of the patient during this telehealth encounter were myself, my nurse/medical assistant, and my front office/scheduling team member.  Review of Systems: No fevers, chills, night sweats, weight loss, chest pain, or shortness of breath.   Objective Findings:    General: Speaking full sentences, no audible heavy breathing.  Sounds alert and appropriately interactive.    Independent interpretation of tests performed by another provider:   None.  Brief History, Exam, Impression, and Recommendations:    Left hip pain Hip osteoarthritis, minimal reproduction of pain with internal rotation of the hip. We did a Solu-Medrol intramuscular injection back on 28 October. Still having pain so we probably need to try 1 into the hip joint itself, he will call and make an appointment. Tramadol for pain in the meantime, he understands not to take tramadol and Tussionex on the same day.  COVID-19 lung Jeff Green has a history of severe COVID with COVID-pneumonia respiratory failure back in 2021. More recently he has had increasing cough, shortness of breath, he is  speaking full sentences on the telephone today. Cough is moderately productive. Moderate wheezing, he has albuterol at home, we will add azithromycin, prednisone, Tussionex, if he worsens he will need to come in next week and be seen in person.  I discussed the above assessment and treatment plan with the patient. The patient was provided an opportunity to ask questions and all were answered. The patient agreed with the plan and demonstrated an understanding of the instructions.   The patient was advised to call back or seek an in-person evaluation if the symptoms worsen or if the condition fails to improve as anticipated.   I provided 30 minutes of verbal and non-verbal time during this encounter date, time was needed to gather information, review chart, records, communicate/coordinate with staff remotely, as well as complete documentation.   ___________________________________________ Gwen Her. Dianah Field, M.D., ABFM., CAQSM. Primary Care and Sports Medicine [place] [facility]  Adjunct Professor of [place] of [facility] of Medicine

## 2021-02-24 NOTE — Assessment & Plan Note (Signed)
Jeff Green has a history of severe COVID with COVID-pneumonia respiratory failure back in 2021. More recently he has had increasing cough, shortness of breath, he is speaking full sentences on the telephone today. Cough is moderately productive. Moderate wheezing, he has albuterol at home, we will add azithromycin, prednisone, Tussionex, if he worsens he will need to come in next week and be seen in person.

## 2021-03-03 ENCOUNTER — Telehealth: Payer: Self-pay

## 2021-03-03 DIAGNOSIS — J1282 Pneumonia due to coronavirus disease 2019: Secondary | ICD-10-CM

## 2021-03-03 DIAGNOSIS — U071 COVID-19: Secondary | ICD-10-CM

## 2021-03-03 MED ORDER — HYDROCOD POLST-CPM POLST ER 10-8 MG/5ML PO SUER: 5.0000 mL | Freq: Two times a day (BID) | ORAL | 0 refills | Status: DC | PRN

## 2021-03-03 NOTE — Telephone Encounter (Signed)
Called and spoke with patient. He states he will be out of the cough medicine tomorrow. He is requesting a refill on the Tussionex . Please advise

## 2021-03-03 NOTE — Telephone Encounter (Signed)
Give it longer, the symptoms can hang around for 3 to 4 weeks after treatment sometimes.

## 2021-03-03 NOTE — Telephone Encounter (Signed)
Refilled it

## 2021-03-03 NOTE — Telephone Encounter (Signed)
Patient called in stating he still has a cough, sinus headache and congestion. He still has Tussionex and Tramadol but is concerned about the congestion. He would like to know what he can take?

## 2021-03-10 ENCOUNTER — Telehealth: Payer: Self-pay | Admitting: Sports Medicine

## 2021-03-10 ENCOUNTER — Other Ambulatory Visit: Payer: Self-pay | Admitting: Sports Medicine

## 2021-03-10 ENCOUNTER — Telehealth: Payer: Self-pay

## 2021-03-10 DIAGNOSIS — I480 Paroxysmal atrial fibrillation: Secondary | ICD-10-CM

## 2021-03-10 NOTE — Telephone Encounter (Signed)
Pt states he will pay more for a 30 day supply. A 30 day supply cost is equal to the amount of a 90 day supply.

## 2021-03-10 NOTE — Telephone Encounter (Signed)
Please call pt to schedule appt.  No further refills until pt is seen.  T. Rebakah Cokley, CMA  

## 2021-03-21 ENCOUNTER — Ambulatory Visit: Payer: Self-pay

## 2021-03-21 ENCOUNTER — Ambulatory Visit: Payer: Medicare Other | Admitting: Family Medicine

## 2021-03-21 ENCOUNTER — Ambulatory Visit (INDEPENDENT_AMBULATORY_CARE_PROVIDER_SITE_OTHER): Payer: Medicare Other | Admitting: Sports Medicine

## 2021-03-21 ENCOUNTER — Other Ambulatory Visit: Payer: Self-pay

## 2021-03-21 ENCOUNTER — Ambulatory Visit (INDEPENDENT_AMBULATORY_CARE_PROVIDER_SITE_OTHER): Payer: Medicare Other

## 2021-03-21 DIAGNOSIS — M19011 Primary osteoarthritis, right shoulder: Secondary | ICD-10-CM

## 2021-03-21 DIAGNOSIS — I480 Paroxysmal atrial fibrillation: Secondary | ICD-10-CM

## 2021-03-21 DIAGNOSIS — M25552 Pain in left hip: Secondary | ICD-10-CM

## 2021-03-21 DIAGNOSIS — M19012 Primary osteoarthritis, left shoulder: Secondary | ICD-10-CM | POA: Diagnosis not present

## 2021-03-21 DIAGNOSIS — E291 Testicular hypofunction: Secondary | ICD-10-CM

## 2021-03-21 DIAGNOSIS — Z86711 Personal history of pulmonary embolism: Secondary | ICD-10-CM

## 2021-03-21 LAB — POCT INR: INR: 4 — AB (ref 2–3)

## 2021-03-21 MED ORDER — "NEEDLE (DISP) 18G X 1-1/2"" MISC": 11 refills | Status: AC

## 2021-03-21 MED ORDER — SYRINGE 22G X 1" 3 ML MISC
11 refills | Status: DC
Start: 2021-03-21 — End: 2021-03-23

## 2021-03-21 MED ORDER — TESTOSTERONE CYPIONATE 200 MG/ML IM SOLN: INTRAMUSCULAR | 3 refills | Status: DC

## 2021-03-21 NOTE — Assessment & Plan Note (Signed)
Hip osteoarthritis, Solu-Medrol injection intramuscular was ineffective, today we did a standard hip joint injection. Return to see me as needed. Chronic process with exacerbation and pharmacologic intervention.

## 2021-03-21 NOTE — Assessment & Plan Note (Signed)
Right glenohumeral injection done in late August, left glenohumeral joint in early September. Repeat left glenohumeral joint injection, he really does need to wait at least 3 to 4 months between shots.

## 2021-03-21 NOTE — Assessment & Plan Note (Signed)
INR slightly supratherapeutic, no bleeding, he can eat some greens. Coumadin dosing will be decreased to 7.5 mg - one half tab on Wednesday and Saturday, 1 tab other days. Recheck nurse visit in 4 weeks.

## 2021-03-21 NOTE — Patient Instructions (Addendum)
INR slightly supratherapeutic Coumadin dosing will be decreased to 7.5 mg - one half tab on Wednesday and Saturday, 1 tab other days. Recheck nurse visit in 4 weeks.

## 2021-03-21 NOTE — Progress Notes (Signed)
    Procedures performed today:    Procedure: Real-time Ultrasound Guided injection of the left glenohumeral joint Device: Samsung HS60  Verbal informed consent obtained.  Time-out conducted.  Noted no overlying erythema, induration, or other signs of local infection.  Skin prepped in a sterile fashion.  Local anesthesia: Topical Ethyl chloride.  With sterile technique and under real time ultrasound guidance: Noted arthritic joint, 1 cc Kenalog 40, 2 cc lidocaine, 2 cc bupivacaine injected easily Completed without difficulty  Advised to call if fevers/chills, erythema, induration, drainage, or persistent bleeding.  Images permanently stored and available for review in PACS.  Impression: Technically successful ultrasound guided injection.  Procedure: Real-time Ultrasound Guided injection of the left hip joint Device: Samsung HS60  Verbal informed consent obtained.  Time-out conducted.  Noted no overlying erythema, induration, or other signs of local infection.  Skin prepped in a sterile fashion.  Local anesthesia: Topical Ethyl chloride.  With sterile technique and under real time ultrasound guidance: Noted arthritic joint, 1 cc Kenalog 40, 2 cc lidocaine, 2 cc bupivacaine injected easily Completed without difficulty  Advised to call if fevers/chills, erythema, induration, drainage, or persistent bleeding.  Images permanently stored and available for review in PACS.  Impression: Technically successful ultrasound guided injection.  Independent interpretation of notes and tests performed by another provider:   None.  Brief History, Exam, Impression, and Recommendations:    Left hip pain Hip osteoarthritis, Solu-Medrol injection intramuscular was ineffective, today we did a standard hip joint injection. Return to see me as needed. Chronic process with exacerbation and pharmacologic intervention.  Primary osteoarthritis of both shoulders Right glenohumeral injection done in late  August, left glenohumeral joint in early September. Repeat left glenohumeral joint injection, he really does need to wait at least 3 to 4 months between shots.  History of pulmonary embolism INR slightly supratherapeutic, no bleeding, he can eat some greens. Coumadin dosing will be decreased to 7.5 mg - one half tab on Wednesday and Saturday, 1 tab other days. Recheck nurse visit in 4 weeks.    ___________________________________________ Gwen Her. Dianah Field, M.D., ABFM., CAQSM. Primary Care and [place] Instructor of [place] of [facility] of Medicine

## 2021-03-23 ENCOUNTER — Other Ambulatory Visit: Payer: Self-pay

## 2021-03-23 DIAGNOSIS — E291 Testicular hypofunction: Secondary | ICD-10-CM

## 2021-03-23 NOTE — Telephone Encounter (Signed)
RX sent on 03/21/2021 was sent to the wrong pharmacy.  Pt wants it sent to [facility] on [address].  Charyl Bigger, CMA

## 2021-03-24 ENCOUNTER — Other Ambulatory Visit: Payer: Self-pay

## 2021-03-24 DIAGNOSIS — E291 Testicular hypofunction: Secondary | ICD-10-CM

## 2021-03-24 MED ORDER — SYRINGE 22G X 1" 3 ML MISC: 11 refills | Status: DC

## 2021-03-24 MED ORDER — TESTOSTERONE CYPIONATE 200 MG/ML IM SOLN: INTRAMUSCULAR | 3 refills | Status: DC

## 2021-04-06 ENCOUNTER — Other Ambulatory Visit: Payer: Self-pay | Admitting: Sports Medicine

## 2021-04-06 DIAGNOSIS — I480 Paroxysmal atrial fibrillation: Secondary | ICD-10-CM

## 2021-04-11 ENCOUNTER — Other Ambulatory Visit: Payer: Self-pay

## 2021-04-11 ENCOUNTER — Ambulatory Visit (INDEPENDENT_AMBULATORY_CARE_PROVIDER_SITE_OTHER): Payer: Medicare Other | Admitting: Sports Medicine

## 2021-04-11 VITALS — BP 119/76 | HR 75

## 2021-04-11 DIAGNOSIS — I480 Paroxysmal atrial fibrillation: Secondary | ICD-10-CM | POA: Diagnosis not present

## 2021-04-11 DIAGNOSIS — Z86711 Personal history of pulmonary embolism: Secondary | ICD-10-CM

## 2021-04-11 LAB — POCT INR: INR: 2.7 (ref 2.0–3.0)

## 2021-04-11 NOTE — Progress Notes (Signed)
° ° °  Procedures performed today:    None.  Independent interpretation of notes and tests performed by another provider:   None.  Brief History, Exam, Impression, and Recommendations:    Paroxysmal atrial fibrillation (HCC) INR therapeutic. Continue current Coumadin dosing 7.5 mg tabs - one half tab on Wednesday and Saturday, 1 tab other days. Recheck nurse visit in 4 weeks.   ___________________________________________ Gwen Her. Dianah Field, M.D., ABFM., CAQSM. Primary Care and [place] Instructor of [place] of [facility] of Medicine

## 2021-04-11 NOTE — Assessment & Plan Note (Signed)
INR therapeutic. Continue current Coumadin dosing 7.5 mg tabs - one half tab on Wednesday and Saturday, 1 tab other days. Recheck nurse visit in 4 weeks.

## 2021-04-11 NOTE — Progress Notes (Signed)
Pt informed.  Pt expressed understanding and is agreeable.  He will call back to schedule nurse visit.  Charyl Bigger, CMA

## 2021-04-25 ENCOUNTER — Telehealth: Payer: Self-pay

## 2021-04-25 DIAGNOSIS — M25552 Pain in left hip: Secondary | ICD-10-CM

## 2021-04-25 MED ORDER — TRAMADOL HCL 50 MG PO TABS: 50.0000 mg | ORAL_TABLET | Freq: Three times a day (TID) | ORAL | 0 refills | Status: DC | PRN

## 2021-04-25 NOTE — Telephone Encounter (Signed)
No way he is walking if his hip is dislocated.  If his hip is dislocated this needs emergency treatment and reduction.  Then again we have to take everything a chiropractor says with a healthy dose of doubt.

## 2021-04-25 NOTE — Telephone Encounter (Signed)
Jeff Green went to his [facility] [employer] and find out his hip is dislocated. He is requesting a muscle relaxer and a refill on Tramadol. Please advise.

## 2021-04-26 MED ORDER — CYCLOBENZAPRINE HCL 10 MG PO TABS: ORAL_TABLET | ORAL | 0 refills | Status: DC

## 2021-04-26 NOTE — Telephone Encounter (Signed)
Refill tramadol yesterday, called in Flexeril today, adding x-rays of both hips, the x-rays will simply show degenerative processes, and will not tell me much without a physical exam.

## 2021-04-26 NOTE — Telephone Encounter (Signed)
Left msg for a return call from patient.  

## 2021-04-26 NOTE — Telephone Encounter (Signed)
Chiro manipulated his hip and now his buttock muscle is hard making it hard to walk. He tried a muscle relaxer that his wife had and this helped some. He would like his own to help relax the muscles. He ran out of tramadol alternating it with tylenol for the pain. He wants a refill of tramadol as well. He requested an xray to "see what is going on in there".

## 2021-04-26 NOTE — Telephone Encounter (Signed)
Patient aware of the prescriptions that have been sent to the pharmacy. He stated he would come tomorrow to get the xrays.

## 2021-04-27 ENCOUNTER — Ambulatory Visit (INDEPENDENT_AMBULATORY_CARE_PROVIDER_SITE_OTHER): Payer: Medicare Other

## 2021-04-27 ENCOUNTER — Other Ambulatory Visit: Payer: Self-pay

## 2021-04-27 DIAGNOSIS — M160 Bilateral primary osteoarthritis of hip: Secondary | ICD-10-CM | POA: Diagnosis not present

## 2021-04-27 DIAGNOSIS — M25552 Pain in left hip: Secondary | ICD-10-CM | POA: Diagnosis not present

## 2021-04-27 DIAGNOSIS — M25551 Pain in right hip: Secondary | ICD-10-CM

## 2021-05-03 ENCOUNTER — Other Ambulatory Visit: Payer: Self-pay | Admitting: Sports Medicine

## 2021-05-03 DIAGNOSIS — I480 Paroxysmal atrial fibrillation: Secondary | ICD-10-CM

## 2021-05-11 DIAGNOSIS — H2513 Age-related nuclear cataract, bilateral: Secondary | ICD-10-CM | POA: Diagnosis not present

## 2021-05-11 DIAGNOSIS — H04123 Dry eye syndrome of bilateral lacrimal glands: Secondary | ICD-10-CM | POA: Diagnosis not present

## 2021-05-16 ENCOUNTER — Telehealth: Payer: Self-pay | Admitting: Sports Medicine

## 2021-05-16 ENCOUNTER — Other Ambulatory Visit: Payer: Self-pay

## 2021-05-16 ENCOUNTER — Ambulatory Visit (INDEPENDENT_AMBULATORY_CARE_PROVIDER_SITE_OTHER): Payer: Medicare Other | Admitting: Sports Medicine

## 2021-05-16 VITALS — BP 124/72 | HR 71

## 2021-05-16 DIAGNOSIS — I480 Paroxysmal atrial fibrillation: Secondary | ICD-10-CM | POA: Diagnosis not present

## 2021-05-16 DIAGNOSIS — Z86711 Personal history of pulmonary embolism: Secondary | ICD-10-CM | POA: Diagnosis not present

## 2021-05-16 DIAGNOSIS — M25552 Pain in left hip: Secondary | ICD-10-CM

## 2021-05-16 DIAGNOSIS — Z7901 Long term (current) use of anticoagulants: Secondary | ICD-10-CM

## 2021-05-16 DIAGNOSIS — Z23 Encounter for immunization: Secondary | ICD-10-CM

## 2021-05-16 LAB — POCT INR: INR: 3.6 — AB (ref 2.0–3.0)

## 2021-05-16 NOTE — Progress Notes (Signed)
° ° °  Procedures performed today:    None.  Independent interpretation of notes and tests performed by another provider:   None.  Brief History, Exam, Impression, and Recommendations:    Anticoagulated on Coumadin INR slightly supratherapeutic. Continue current Coumadin dosing 7.5 mg tabs - one half tab on Wednesday and Saturday, 1 tab other days. Recheck nurse visit in 1 week.    ___________________________________________ Gwen Her. Dianah Field, M.D., ABFM., CAQSM. Primary Care and [place] Instructor of [place] of [facility] of Medicine

## 2021-05-16 NOTE — Telephone Encounter (Signed)
Pt is requesting a referral to [facility] for his hip he seen you about. He states he is ready to go to see the surgeon

## 2021-05-16 NOTE — Telephone Encounter (Signed)
I called pt and let him know that Dr.T placed the referral to [facility] in [place] and ask him to let us know if he does not hear from them within a week.

## 2021-05-16 NOTE — Assessment & Plan Note (Signed)
INR slightly supratherapeutic. Continue current Coumadin dosing 7.5 mg tabs - one half tab on Wednesday and Saturday, 1 tab other days. Recheck nurse visit in 1 week.

## 2021-05-16 NOTE — Telephone Encounter (Signed)
Referral placed.

## 2021-05-17 ENCOUNTER — Other Ambulatory Visit: Payer: Self-pay | Admitting: Sports Medicine

## 2021-05-17 DIAGNOSIS — M25552 Pain in left hip: Secondary | ICD-10-CM

## 2021-05-17 NOTE — Progress Notes (Signed)
Patient advised.

## 2021-05-17 NOTE — Telephone Encounter (Signed)
Jeff Green would like an increased amount sent to the pharmacy. He is having terrible hip pain. Please advise.

## 2021-05-18 DIAGNOSIS — M1612 Unilateral primary osteoarthritis, left hip: Secondary | ICD-10-CM | POA: Diagnosis not present

## 2021-05-19 ENCOUNTER — Other Ambulatory Visit: Payer: Self-pay | Admitting: Orthopedic Surgery

## 2021-05-22 NOTE — Progress Notes (Signed)
Surgery orders requested via Epic inbox. °

## 2021-05-23 ENCOUNTER — Telehealth: Payer: Self-pay

## 2021-05-23 NOTE — Telephone Encounter (Signed)
We do not currently follow pt. Was seen in 2017 by cardiology and not more recently. PCP is currently managing pt's warfarin, recommend they provide clearance.

## 2021-05-23 NOTE — Telephone Encounter (Signed)
° °  Pre-operative Risk Assessment    Patient Name: Jeff Green  DOB: Jan 25, 1956 MRN: [id number]      Request for Surgical Clearance    Procedure:  Left total hip arthroplasty  Date of Surgery:  Clearance 06/09/21                                 Surgeon:  Dr.John Berenice Primas Surgeon's Group or Practice Name:  [facility] Phone number:  [phone] Fax number:  [phone]   Type of Clearance Requested:   - Medical  - Pharmacy:  Hold Warfarin (Coumadin)     Type of Anesthesia:  Not Indicated   Additional requests/questions:  Please advise surgeon/provider what medications should be held. Please fax a copy of clearance to the surgeon's office.  Evalee Mutton   05/23/2021, 3:36 PM

## 2021-05-24 ENCOUNTER — Ambulatory Visit (INDEPENDENT_AMBULATORY_CARE_PROVIDER_SITE_OTHER): Payer: Medicare Other | Admitting: Sports Medicine

## 2021-05-24 ENCOUNTER — Other Ambulatory Visit: Payer: Self-pay

## 2021-05-24 VITALS — BP 118/78 | HR 80 | Wt 270.0 lb

## 2021-05-24 DIAGNOSIS — I480 Paroxysmal atrial fibrillation: Secondary | ICD-10-CM | POA: Diagnosis not present

## 2021-05-24 DIAGNOSIS — Z7901 Long term (current) use of anticoagulants: Secondary | ICD-10-CM

## 2021-05-24 DIAGNOSIS — Z86711 Personal history of pulmonary embolism: Secondary | ICD-10-CM

## 2021-05-24 LAB — POCT INR: INR: 3.1 — AB (ref 2.0–3.0)

## 2021-05-24 MED ORDER — ENOXAPARIN SODIUM 40 MG/0.4ML IJ SOSY: PREFILLED_SYRINGE | INTRAMUSCULAR | 0 refills | Status: DC

## 2021-05-24 NOTE — Progress Notes (Signed)
° ° °  Procedures performed today:    None.  Independent interpretation of notes and tests performed by another provider:   None.  Brief History, Exam, Impression, and Recommendations:    Anticoagulated on Coumadin Lynne is chronically anticoagulated for multiple DVTs, he also had some paroxysmal A. fib, now in sinus rhythm. He will be having a hip replacement on February 17, he should stop his Coumadin and take a dose of vitamin K orally over-the-counter on February 10, he should also start Lovenox 40 mg subcu daily at this time.  He should stop his Lovenox 40 mg subcu daily on 15 February, and should restart 12 to 24 hours postop, he should also restart his Coumadin dosing postop, he should continue until his INR is therapeutic (INR 2.5-3.5) and then continue his Lovenox for 2 more days before stopping Lovenox.  I spent 30 minutes of total time managing this patient today, this includes chart review, face to face, and non-face to face time.  ___________________________________________ Gwen Her. Dianah Field, M.D., ABFM., CAQSM. Primary Care and [place] Instructor of [place] of [facility] of Medicine

## 2021-05-24 NOTE — Assessment & Plan Note (Signed)
Jeff Green is chronically anticoagulated for multiple DVTs, he also had some paroxysmal A. fib, now in sinus rhythm. He will be having a hip replacement on February 17, he should stop his Coumadin and take a dose of vitamin K orally over-the-counter on February 10, he should also start Lovenox 40 mg subcu daily at this time.  He should stop his Lovenox 40 mg subcu daily on 15 February, and should restart 12 to 24 hours postop, he should also restart his Coumadin dosing postop, he should continue until his INR is therapeutic (INR 2.5-3.5) and then continue his Lovenox for 2 more days before stopping Lovenox.

## 2021-05-25 NOTE — Progress Notes (Addendum)
Anesthesia Review:  PCP: DR [name] LOV 05/24/21  LOV 03/21/21.   Cardiologist : To see DR Kirk Ruths on 06/06/21 at 0850 [address] Morn)  Chest x-ray :05/29/21  EKG :05/29/21  Echo : Stress test: Cardiac Cath :  Activity level: can do a flight of stairs without difficulty  Sleep Study/ CPAP : none Fasting Blood Sugar :      / Checks Blood Sugar -- times a day:   Blood Thinner/ Instructions /Last Dose: ASA / Instructions/ Last Dose :   Coumadin  Lovenox bridge  Copy of preop instructions on chart regarding Coumadin and Lovenox  from DR Kem Boroughs also in epic.  Hx of cardiac stents in [place] , dvt and pe  and afib  Prediabetes per pt  on no meds  Hgba1c-05/29/21- 6.8  \no coivd test- ambulatory surgery

## 2021-05-25 NOTE — Progress Notes (Signed)
Your procedure is scheduled on:      2/`17/23   Report to [facility]  Entrance   Report to admitting at   [phone]     Call this number if you have problems the morning of surgery [phone]    REMEMBER: NO  SOLID FOOD CANDY OR GUM AFTER MIDNIGHT. CLEAR LIQUIDS UNTIL    0700am        . NOTHING BY MOUTH EXCEPT CLEAR LIQUIDS UNTIL   0700am  . PLEASE FINISH ENSURE DRINK PER SURGEON ORDER  WHICH NEEDS TO BE COMPLETED AT      .0700am       CLEAR LIQUID DIET   Foods Allowed                                                                    Coffee and tea, regular and decaf                            Fruit ices (not with fruit pulp)                                      Iced Popsicles                                    Carbonated beverages, regular and diet                                    Cranberry, grape and apple juices Sports drinks like Gatorade Lightly seasoned clear broth or consume(fat free) Sugar, honey syrup ___________________________________________________________________      BRUSH YOUR TEETH MORNING OF SURGERY AND RINSE YOUR MOUTH OUT, NO CHEWING GUM CANDY OR MINTS.     Take these medicines the morning of surgery with A SIP OF WATER: coreg, cardizem., topamax   DO NOT TAKE ANY DIABETIC MEDICATIONS DAY OF YOUR SURGERY                               You may not have any metal on your body including hair pins and              piercings  Do not wear jewelry, make-up, lotions, powders or perfumes, deodorant             Do not wear nail polish on your fingernails.  Do not shave  48 hours prior to surgery.              Men may shave face and neck.   Do not bring valuables to the hospital. [place].  Contacts, dentures or bridgework may not be worn into surgery.  Leave suitcase in the car. After surgery it may be brought to your room.     Patients discharged the day of surgery will  not be  allowed to drive home. IF YOU ARE HAVING SURGERY AND GOING HOME THE SAME DAY, YOU MUST HAVE AN ADULT TO DRIVE YOU HOME AND BE WITH YOU FOR 24 HOURS. YOU MAY GO HOME BY TAXI OR UBER OR ORTHERWISE, BUT AN ADULT MUST ACCOMPANY YOU HOME AND STAY WITH YOU FOR 24 HOURS.  Name and phone number of your driver:  Special Instructions: N/A              Please read over the following fact sheets you were given: _____________________________________________________________________  [facility] - Preparing for Surgery Before surgery, you can play an important role.  Because skin is not sterile, your skin needs to be as free of germs as possible.  You can reduce the number of germs on your skin by washing with CHG (chlorahexidine gluconate) soap before surgery.  CHG is an antiseptic cleaner which kills germs and bonds with the skin to continue killing germs even after washing. Please DO NOT use if you have an allergy to CHG or antibacterial soaps.  If your skin becomes reddened/irritated stop using the CHG and inform your nurse when you arrive at Short Stay. Do not shave (including legs and underarms) for at least 48 hours prior to the first CHG shower.  You may shave your face/neck. Please follow these instructions carefully:  1.  Shower with CHG Soap the night before surgery and the  morning of Surgery.  2.  If you choose to wash your hair, wash your hair first as usual with your  normal  shampoo.  3.  After you shampoo, rinse your hair and body thoroughly to remove the  shampoo.                           4.  Use CHG as you would any other liquid soap.  You can apply chg directly  to the skin and wash                       Gently with a scrungie or clean washcloth.  5.  Apply the CHG Soap to your body ONLY FROM THE NECK DOWN.   Do not use on face/ open                           Wound or open sores. Avoid contact with eyes, ears mouth and genitals (private parts).                       Wash face,  Genitals  (private parts) with your normal soap.             6.  Wash thoroughly, paying special attention to the area where your surgery  will be performed.  7.  Thoroughly rinse your body with warm water from the neck down.  8.  DO NOT shower/wash with your normal soap after using and rinsing off  the CHG Soap.                9.  Pat yourself dry with a clean towel.            10.  Wear clean pajamas.            11.  Place clean sheets on your bed the night of your first shower and do not  sleep with pets. Day of Surgery : Do not  apply any lotions/deodorants the morning of surgery.  Please wear clean clothes to the hospital/surgery center.  FAILURE TO FOLLOW THESE INSTRUCTIONS MAY RESULT IN THE CANCELLATION OF YOUR SURGERY PATIENT SIGNATURE_________________________________  NURSE SIGNATURE__________________________________  ________________________________________________________________________

## 2021-05-25 NOTE — Telephone Encounter (Signed)
° °  Name: Jeff Green  DOB: December 28, 1955  MRN: [id number]  Primary Cardiologist: previously Dr. Stanford Breed (last seen 10/19/2015)  Chart reviewed as part of pre-operative protocol coverage. Because of [name]'s past medical history and time since last visit, he will require a follow-up visit in order to better assess preoperative cardiovascular risk.  Pre-op covering staff: - Please schedule appointment and call patient to inform them. If patient already had an upcoming appointment within acceptable timeframe, please add "pre-op clearance" to the appointment notes so provider is aware. - Please contact requesting surgeon's office via preferred method (i.e, phone, fax) to inform them of need for appointment prior to surgery.  If applicable, this message wil and l also be routed to pharmacy pool and/or primary cardiologist for input on holding anticoagulant/antiplatelet agent as requested below so that this information is available to the clearing provider at time of patient's appointment.   Patient has been seen by his PCP who manages Coumadin, Lovenox bridging has been set up.   Lee [place], [place]  05/25/2021, 4:18 PM

## 2021-05-26 ENCOUNTER — Other Ambulatory Visit: Payer: Self-pay

## 2021-05-26 DIAGNOSIS — M25552 Pain in left hip: Secondary | ICD-10-CM

## 2021-05-26 MED ORDER — TRAMADOL HCL 50 MG PO TABS: ORAL_TABLET | ORAL | 0 refills | Status: DC

## 2021-05-26 NOTE — Telephone Encounter (Signed)
I left a message for the patient to return my call to schedule an appt for pre-op clearance

## 2021-05-26 NOTE — Telephone Encounter (Signed)
Patient asking for one more refill prior to surgery.

## 2021-05-29 ENCOUNTER — Other Ambulatory Visit: Payer: Self-pay

## 2021-05-29 ENCOUNTER — Ambulatory Visit (HOSPITAL_COMMUNITY)
Admission: RE | Admit: 2021-05-29 | Discharge: 2021-05-29 | Disposition: A | Discharge status: Home / Self Care | Payer: Medicare Other | Source: Ambulatory Visit | Attending: Orthopedic Surgery | Admitting: Orthopedic Surgery

## 2021-05-29 ENCOUNTER — Encounter (HOSPITAL_COMMUNITY)
Admission: RE | Admit: 2021-05-29 | Discharge: 2021-05-29 | Disposition: A | Discharge status: Home / Self Care | Payer: Medicare Other | Source: Ambulatory Visit | Attending: Orthopedic Surgery | Admitting: Orthopedic Surgery

## 2021-05-29 ENCOUNTER — Encounter (HOSPITAL_COMMUNITY): Payer: Self-pay

## 2021-05-29 VITALS — BP 120/81 | HR 69 | Temp 98.2°F | Resp 16 | Ht 74.5 in | Wt 272.0 lb

## 2021-05-29 DIAGNOSIS — Z01818 Encounter for other preprocedural examination: Secondary | ICD-10-CM | POA: Insufficient documentation

## 2021-05-29 DIAGNOSIS — I509 Heart failure, unspecified: Secondary | ICD-10-CM | POA: Insufficient documentation

## 2021-05-29 DIAGNOSIS — E119 Type 2 diabetes mellitus without complications: Secondary | ICD-10-CM | POA: Diagnosis not present

## 2021-05-29 DIAGNOSIS — Z01811 Encounter for preprocedural respiratory examination: Secondary | ICD-10-CM | POA: Insufficient documentation

## 2021-05-29 DIAGNOSIS — M1612 Unilateral primary osteoarthritis, left hip: Secondary | ICD-10-CM | POA: Diagnosis not present

## 2021-05-29 HISTORY — DX: Unspecified psychosis not due to a substance or known physiological condition: F29

## 2021-05-29 HISTORY — DX: Pneumonia, unspecified organism: J18.9

## 2021-05-29 HISTORY — DX: Malignant (primary) neoplasm, unspecified: C80.1

## 2021-05-29 HISTORY — DX: Cardiac arrhythmia, unspecified: I49.9

## 2021-05-29 HISTORY — DX: Unspecified osteoarthritis, unspecified site: M19.90

## 2021-05-29 HISTORY — DX: Unspecified cataract: H26.9

## 2021-05-29 LAB — CBC
HCT: 43.8 % (ref 39.0–52.0)
Hemoglobin: 14.5 g/dL (ref 13.0–17.0)
MCH: 30.3 pg (ref 26.0–34.0)
MCHC: 33.1 g/dL (ref 30.0–36.0)
MCV: 91.4 fL (ref 80.0–100.0)
Platelets: 161 10*3/uL (ref 150–400)
RBC: 4.79 MIL/uL (ref 4.22–5.81)
RDW: 14.1 % (ref 11.5–15.5)
WBC: 6.7 10*3/uL (ref 4.0–10.5)
nRBC: 0 % (ref 0.0–0.2)

## 2021-05-29 LAB — HEMOGLOBIN A1C
Hgb A1c MFr Bld: 6.8 % — ABNORMAL HIGH (ref 4.8–5.6)
Mean Plasma Glucose: 148.46 mg/dL

## 2021-05-29 LAB — BASIC METABOLIC PANEL
Anion gap: 8 (ref 5–15)
BUN: 30 mg/dL — ABNORMAL HIGH (ref 8–23)
CO2: 23 mmol/L (ref 22–32)
Calcium: 10.2 mg/dL (ref 8.9–10.3)
Chloride: 108 mmol/L (ref 98–111)
Creatinine, Ser: 1.36 mg/dL — ABNORMAL HIGH (ref 0.61–1.24)
GFR, Estimated: 58 mL/min — ABNORMAL LOW (ref >60)
Glucose, Bld: 161 mg/dL — ABNORMAL HIGH (ref 70–99)
Potassium: 4 mmol/L (ref 3.5–5.1)
Sodium: 139 mmol/L (ref 135–145)

## 2021-05-29 LAB — TYPE AND SCREEN
ABO/RH(D): B POS
Antibody Screen: NEGATIVE

## 2021-05-29 LAB — SURGICAL PCR SCREEN
MRSA, PCR: NEGATIVE
Staphylococcus aureus: NEGATIVE

## 2021-05-29 LAB — GLUCOSE, CAPILLARY: Glucose-Capillary: 169 mg/dL — ABNORMAL HIGH (ref 70–99)

## 2021-05-29 NOTE — Telephone Encounter (Signed)
Pt is scheduled to see Terie Purser, NP, 06/06/21, clearance will be addressed at that time.  Will route to the requesting surgeon's office to make them aware.

## 2021-05-30 ENCOUNTER — Telehealth: Payer: Self-pay

## 2021-05-30 ENCOUNTER — Other Ambulatory Visit: Payer: Self-pay | Admitting: Sports Medicine

## 2021-05-30 MED ORDER — VITAMIN K (PHYTONADIONE) 100 MCG PO TABS: ORAL_TABLET | ORAL | 0 refills | Status: DC

## 2021-05-30 NOTE — Care Plan (Signed)
Ortho Bundle Case Management Note  Patient Details  Name: Jeff Green MRN: [id number] Date of Birth: 1956/02/09      Spoke with patient prior to surgery. He will discharge to home with assist from family. Rolling walker ordered. HHPT referral to [facility]  and OPPT set up with [facility]. Patient and MD in agreement with plan. Choice offered              DME Arranged:  Walker rolling DME Agency:  [facility]  HH Arranged:  PT HH Agency:  [facility]  Additional Comments: Please contact me with any questions of if this plan should need to change.  Ladell Heads,  [facility] [facility]  [phone] 05/30/2021, 11:39 AM

## 2021-05-30 NOTE — Telephone Encounter (Signed)
Patient called about getting a prescription for his Vitamin K for a future surgery. Spoke with Dr. Darene Lamer about the issue and he called in the rx to the patient's [facility]). Called back patient made him aware.

## 2021-06-02 NOTE — Progress Notes (Signed)
Cardiology Office Note:    Date:  06/06/2021   ID:  Jeff Green, DOB [birth year], MRN [id number]  PCP:  Silverio Decamp, MD   [facility] Providers Cardiologist:  Kirk Ruths, MD     Referring MD: Silverio Decamp,*   Chief Complaint: preoperative cardiac evaluation  History of Present Illness:    Jeff Green is a 66 y.o. male with a hx of CAD, HTN, PAF, DM, and testicular cancer.   He initially established care with our group in April 2017.  At office visit he reported stent placement in 2013 in [place], records are not available. He was admitted to [facility] 2/17 for respiratory infection and was found to have new onset atrial fibrillation.  Nuclear study at that time showed ejection fraction 46%, no ischemia or infarction.  Echo revealed grossly normal LVEF. TSH was 1.270.  He converted to sinus rhythm on metoprolol, Cardizem and amiodarone.  On chronic anticoagulation because of recurrent DVT and PE.  Amiodarone was discontinued due to his young age at follow-up visit in June 2017.  Not on aspirin given need for anticoagulation.   He was last seen in our office by Dr. Stanford Breed on 10/19/15. He felt that respiratory infection likely contributed to atrial fibrillation. His losartan was increased due to elevated BP and he was advised to return in one week for basic metabolic panel and to follow-up with provider in 6 months.There is no record of return visit.   Today, he is here for preoperative cardiac clearance for upcoming hip replacement. He is on warfarin for chronic DVT/PE.  He has had no further episodes consistent with fluttering and racing heart that he felt when initially diagnosed with atrial fibrillation in 2017.  He does not exercise on a consistent basis and went out of work on disability 30 years ago. 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  He is able to achieve greater than 4 METS activity without chest pain or shortness of breath,  somewhat limited by hip pain.  He denies chest pain, shortness of breath, lower extremity edema, fatigue, palpitations, melena, hematuria, hemoptysis, diaphoresis, weakness, presyncope, syncope, orthopnea, and PND. Reports occasional early satiety that he feels is related to his weight. According to the Revised Cardiac Risk Index (RCRI), his Perioperative Risk of Major Cardiac Event is (%): 6.6 His Functional Capacity in METs is: 5.62 according to the [facility] Activity Status Index (DASI).   Past Medical History:  Diagnosis Date   -+    Stent placed; [place]   Arthritis    Atrial fibrillation ([place])    Cancer [facility])    testicular cancer   Cataract, left eye    Congestive heart failure (CHF) (HCC)    DVT (deep venous thrombosis) (HCC)    High cholesterol    Pneumonia    Psychosis ([place])    Pulmonary embolus ([place])     Past Surgical History:  Procedure Laterality Date   HERNIA REPAIR     IVC  2014   lamenectomy     left orchiectomy      Current Medications: Current Meds  Medication Sig   amitriptyline (ELAVIL) 50 MG tablet TAKE ONE-HALF TABLET BY MOUTH AT BEDTIME FOR A WEEK, THEN 1 TABLET AT BEDITME   Biotin 5000 MCG CAPS Take 5,000 mcg by mouth daily.   carvedilol (COREG) 25 MG tablet TAKE ONE TABLET BY MOUTH TWICE A DAY WITH A MEAL   Coenzyme Q10 (COQ-10 PO) Take  200 mg by mouth daily.   cyclobenzaprine (FLEXERIL) 10 MG tablet One half to one tab PO qHS, then increase gradually to one tab TID. (Patient taking differently: Take 5-10 mg by mouth 3 (three) times daily as needed for muscle spasms.)   diltiazem (CARDIZEM CD) 120 MG 24 hr capsule TAKE ONE CAPSULE BY MOUTH DAILY   diltiazem (TIAZAC) 120 MG 24 hr capsule TAKE ONE CAPSULE BY MOUTH DAILY   enoxaparin (LOVENOX) 40 MG/0.4ML injection Inject 40 mg subcutaneous daily as directed   fenofibrate 160 MG tablet TAKE ONE TABLET BY MOUTH DAILY   GLUCOSAMINE CHONDROITIN COMPLX PO Take 2 tablets by mouth daily.   Multiple  Vitamin (MULTIVITAMIN) capsule Take 1 capsule by mouth daily.   NEEDLE, DISP, 18 G 18G X 1-1/2" MISC Use as needed for testosterone injections   Probiotic Product (PROBIOTIC DAILY PO) Take 1 capsule by mouth.   rosuvastatin (CRESTOR) 40 MG tablet Take 1 tablet (40 mg total) by mouth daily.   Syringe/Needle, Disp, (SYRINGE 3CC/22GX1") 22G X 1" 3 ML MISC Use as needed for testosterone injections   testosterone cypionate (DEPOTESTOSTERONE CYPIONATE) 200 MG/ML injection 78mL IM every other week.   topiramate (TOPAMAX) 100 MG tablet Take 1 tablet (100 mg total) by mouth 2 (two) times daily.   traMADol (ULTRAM) 50 MG tablet TAKE ONE TO TWO TABLETS BY MOUTH EVERY 8 HOURS AS NEEDED FOR MODERATE PAIN, MAX DAILY DOSE OF 6 TABLETS PER DAY   Vitamin K, Phytonadione, 100 MCG TABS 1 tab p.o. x1   warfarin (COUMADIN) 7.5 MG tablet TAKE ONE TABLET BY MOUTH DAILY (Patient taking differently: Take 3.75-7.5 mg by mouth See admin instructions. Take 3.75 mg by mouth on Wednesday and Saturday, all remaining days take 7.5 mg)     Allergies:   Lipitor [atorvastatin]   Social History   Socioeconomic History   Marital status: Married    Spouse name: Not on file   Number of children: 5   Years of education: Not on file   Highest education level: Not on file  Occupational History   Not on file  Tobacco Use   Smoking status: Former    Types: Cigarettes    Quit date: 08/10/1999    Years since quitting: 21.8   Smokeless tobacco: Never  Vaping Use   Vaping Use: Never used  Substance and Sexual Activity   Alcohol use: Yes    Alcohol/week: 0.0 standard drinks    Comment: Rare   Drug use: No   Sexual activity: Yes    Partners: Female  Other Topics Concern   Not on file  Social History Narrative   Not on file   Social Determinants of Health   Financial Resource Strain: Not on file  Food Insecurity: Not on file  Transportation Needs: Not on file  Physical Activity: Not on file  Stress: Not on file   Social Connections: Not on file     Family History: The patient's family history includes Depression in his mother; Heart disease in his father; High Cholesterol in his father; High blood pressure in his father; Stroke in an other family member; Thyroid cancer in his mother.  ROS:   Please see the history of present illness.    + early satiety All other systems reviewed and are negative.  Labs/Other Studies Reviewed:    The following studies were reviewed today:  CXR 05/29/21  Decreased lung volumes are seen. Mild, stable scarring and/or atelectasis is noted within the left lung base.  There is no evidence of a pleural effusion or pneumothorax. The heart size and mediastinal contours are within normal limits. The visualized skeletal structures are unremarkable.   IMPRESSION: Mild, stable left basilar scarring and/or atelectasis.    Recent Labs: 06/23/2020: ALT 16; TSH 1.53 05/29/2021: BUN 30; Creatinine, Ser 1.36; Hemoglobin 14.5; Platelets 161; Potassium 4.0; Sodium 139  Recent Lipid Panel    Component Value Date/Time   CHOL 174 06/23/2020 0000   TRIG 104 06/23/2020 0000   HDL 66 06/23/2020 0000   CHOLHDL 2.6 06/23/2020 0000   VLDL 44 (H) 01/11/2016 1157   LDLCALC 88 06/23/2020 0000     Risk Assessment/Calculations:    CHA2DS2-VASc Score = 2  This indicates a 2.2% annual risk of stroke. The patient's score is based upon: CHF History: 0 HTN History: 1 Diabetes History: 0 Stroke History: 0 Vascular Disease History: 1 Age Score: 0 Gender Score: 0    Physical Exam:    VS:  BP 122/76 (BP Location: Left Arm, Patient Position: Sitting, Cuff Size: Large)    Pulse 68    Ht 6' 2.5" (1.892 m)    Wt 285 lb 8 oz (129.5 kg)    SpO2 95%    BMI 36.17 kg/m     Wt Readings from Last 3 Encounters:  06/06/21 285 lb 8 oz (129.5 kg)  05/29/21 272 lb (123.4 kg)  05/24/21 270 lb (122.5 kg)     GEN:  Well nourished, well developed in no acute distress HEENT: Normal NECK: No  JVD; No carotid bruits CARDIAC: RRR, no murmurs, rubs, gallops RESPIRATORY:  Clear to auscultation without rales, wheezing or rhonchi  ABDOMEN: Soft, non-tender, non-distended MUSCULOSKELETAL:  No edema; No deformity. 2+ pedal pulses, equal bilaterally SKIN: Warm and dry NEUROLOGIC:  Alert and oriented x 3 PSYCHIATRIC:  Normal affect   EKG:  EKG is not ordered today.   Diagnoses:    1. Preoperative cardiovascular examination   2. Essential hypertension   3. Type 2 diabetes mellitus with complication, without long-term current use of insulin ([place])   4. Hyperlipidemia LDL goal <70    Assessment and Plan:     Preoperative cardiac evaluation: RCRI score for MACE is 6.6%. Able to achieve > 4 METS activity without chest pain or SOB. He may proceed to surgery without additional cardiac testing. Will forward note of clearance to Dr. Berenice Primas for upcoming surgery 06/09/21.   Essential hypertension: BP well-controlled. States BP is always good. Creatinine stable. Continue losartan, diltiazem.   Hyperlipidemia LDL goal < 70: LDL 88 on 06/23/20 - on rosuvastatin, fenofibrate. Has upcoming appointment with PCP for recheck. Management per PCP.   DM: A1C 6.8 on 05/29/21. Not on medication. States he has been encouraged to lose weight and has follow-up with PCP in one month.     Disposition: 1 year with Dr. Stanford Breed or APP  Medication Adjustments/Labs and Tests Ordered: Current medicines are reviewed at length with the patient today.  Concerns regarding medicines are outlined above.  No orders of the defined types were placed in this encounter.  No orders of the defined types were placed in this encounter.   Patient Instructions  Medication Instructions:  Your physician recommends that you continue on your current medications as directed. Please refer to the Current Medication list given to you today.  *If you need a refill on your cardiac medications before your next appointment, please call your  pharmacy*   Lab Work: None Ordered If you have labs (blood work)  drawn today and your tests are completely normal, you will receive your results only by: MyChart Message (if you have MyChart) OR A paper copy in the mail If you have any lab test that is abnormal or we need to change your treatment, we will call you to review the results.   Testing/Procedures: None Ordered   Follow-Up: At [facility], you and your health needs are our priority.  As part of our continuing mission to provide you with exceptional heart care, we have created designated Provider Care Teams.  These Care Teams include your primary Cardiologist (physician) and Advanced Practice Providers (APPs -  Physician Assistants and Nurse Practitioners) who all work together to provide you with the care you need, when you need it.   Your next appointment:   1 year(s)  The format for your next appointment:   In Person  Provider:   Kirk Ruths, MD  If primary card or EP is not listed click here to update    :1}       Signed, Emmaline Life, NP  06/06/2021 3:11 PM    [place]

## 2021-06-06 ENCOUNTER — Other Ambulatory Visit: Payer: Self-pay

## 2021-06-06 ENCOUNTER — Encounter (HOSPITAL_BASED_OUTPATIENT_CLINIC_OR_DEPARTMENT_OTHER): Payer: Self-pay | Admitting: Nurse Practitioner

## 2021-06-06 ENCOUNTER — Ambulatory Visit (HOSPITAL_BASED_OUTPATIENT_CLINIC_OR_DEPARTMENT_OTHER): Payer: Medicare Other | Admitting: Nurse Practitioner

## 2021-06-06 VITALS — BP 122/76 | HR 68 | Ht 74.5 in | Wt 285.5 lb

## 2021-06-06 DIAGNOSIS — I10 Essential (primary) hypertension: Secondary | ICD-10-CM

## 2021-06-06 DIAGNOSIS — E785 Hyperlipidemia, unspecified: Secondary | ICD-10-CM | POA: Diagnosis not present

## 2021-06-06 DIAGNOSIS — Z01810 Encounter for preprocedural cardiovascular examination: Secondary | ICD-10-CM

## 2021-06-06 DIAGNOSIS — E118 Type 2 diabetes mellitus with unspecified complications: Secondary | ICD-10-CM | POA: Diagnosis not present

## 2021-06-06 NOTE — Patient Instructions (Signed)
Medication Instructions:  Your physician recommends that you continue on your current medications as directed. Please refer to the Current Medication list given to you today.  *If you need a refill on your cardiac medications before your next appointment, please call your pharmacy*   Lab Work: None Ordered If you have labs (blood work) drawn today and your tests are completely normal, you will receive your results only by: [place] (if you have MyChart) OR A paper copy in the mail If you have any lab test that is abnormal or we need to change your treatment, we will call you to review the results.   Testing/Procedures: None Ordered   Follow-Up: At [facility], you and your health needs are our priority.  As part of our continuing mission to provide you with exceptional heart care, we have created designated Provider Care Teams.  These Care Teams include your primary Cardiologist (physician) and Advanced Practice Providers (APPs -  Physician Assistants and Nurse Practitioners) who all work together to provide you with the care you need, when you need it.   Your next appointment:   1 year(s)  The format for your next appointment:   In Person  Provider:   Kirk Ruths, MD  If primary card or EP is not listed click here to update    :1}

## 2021-06-06 NOTE — Anesthesia Preprocedure Evaluation (Addendum)
Anesthesia Evaluation  Patient identified by MRN, date of birth, ID band Patient awake    Reviewed: Allergy & Precautions, NPO status , Patient's Chart, lab work & pertinent test results, reviewed documented beta blocker date and time   Airway Mallampati: III  TM Distance: >3 FB Neck ROM: Full    Dental  (+) Dental Advisory Given   Pulmonary sleep apnea , former smoker, PE   Pulmonary exam normal breath sounds clear to auscultation       Cardiovascular hypertension (150/83 in preop, normally 120s SBP), Pt. on medications and Pt. on home beta blockers + CAD, + Cardiac Stents (13 years ago) and +CHF  Normal cardiovascular exam+ dysrhythmias (coumadin last dose 7d ago) Atrial Fibrillation  Rhythm:Regular Rate:Normal     Neuro/Psych negative neurological ROS  negative psych ROS   GI/Hepatic negative GI ROS, Neg liver ROS,   Endo/Other  diabetes, Well ControlledBMI 36  Renal/GU Renal diseaseCr 1.36  negative genitourinary   Musculoskeletal  (+) Arthritis , Osteoarthritis,  Back surgery x 2   Abdominal (+) + obese,   Peds  Hematology negative hematology ROS (+) hct 43.8, plt 161 INR 1.0   Anesthesia Other Findings   Reproductive/Obstetrics negative OB ROS                            Anesthesia Physical Anesthesia Plan  ASA: 3  Anesthesia Plan: Spinal   Post-op Pain Management: Ofirmev IV (intra-op)*   Induction:   PONV Risk Score and Plan: 2 and Propofol infusion and TIVA  Airway Management Planned: Natural Airway and Nasal Cannula  Additional Equipment: None  Intra-op Plan:   Post-operative Plan:   Informed Consent: I have reviewed the patients History and Physical, chart, labs and discussed the procedure including the risks, benefits and alternatives for the proposed anesthesia with the patient or authorized representative who has indicated his/her understanding and acceptance.        Plan Discussed with: CRNA  Anesthesia Plan Comments:        Anesthesia Quick Evaluation

## 2021-06-06 NOTE — Progress Notes (Signed)
Anesthesia Chart Review   Case: [id number] Date/Time: 06/09/21 0950   Procedure: TOTAL HIP ARTHROPLASTY ANTERIOR APPROACH (Left: Hip)   Anesthesia type: Spinal   Pre-op diagnosis: LEFT HIP OSTEOARTHRITIS   Location: [facility] ROOM 08 / [facility]   Surgeons: Dorna Leitz, MD       DISCUSSION:65 y.o. former smoker with h/o atrial fibrillation, CHF, PE, DVT, diet controlled DM II, left hip OA scheduled for above procedure 06/09/2021 with Dr. Dorna Leitz.   Pt last seen by cardiology 06/06/2021. Per OV note, "Preoperative cardiac evaluation: RCRI score for MACE is 6.6%. Able to achieve > 4 METS activity without chest pain or SOB. He may proceed to surgery without additional cardiac testing. Will forward note of clearance to Dr. Berenice Primas for upcoming surgery 06/09/21."  Anticipate pt can proceed with planned procedure barring acute status change.   VS: BP 120/81    Pulse 69    Temp 36.8 C (Oral)    Resp 16    Ht 6' 2.5" (1.892 m)    Wt 123.4 kg    SpO2 96%    BMI 34.46 kg/m   PROVIDERS: Silverio Decamp, MD is PCP   Cardiologist:  Kirk Ruths, MD {  LABS: Labs reviewed: Acceptable for surgery. (all labs ordered are listed, but only abnormal results are displayed)  Labs Reviewed  BASIC METABOLIC PANEL - Abnormal; Notable for the following components:      Result Value   Glucose, Bld 161 (*)    BUN 30 (*)    Creatinine, Ser 1.36 (*)    GFR, Estimated 58 (*)    All other components within normal limits  GLUCOSE, CAPILLARY - Abnormal; Notable for the following components:   Glucose-Capillary 169 (*)    All other components within normal limits  HEMOGLOBIN A1C - Abnormal; Notable for the following components:   Hgb A1c MFr Bld 6.8 (*)    All other components within normal limits  SURGICAL PCR SCREEN  CBC  TYPE AND SCREEN     IMAGES:   EKG: 05/29/2021 Rate 64 bpm  Normal sinus rhythm Left axis deviation Inferior infarct , age undetermined Abnormal ECG No previous ECGs  available  CV: Echo 10/13/2019 Left Ventricle: Systolic function is normal. EF: 60-65%.    Tricuspid Valve: There is trace regurgitation.    Pericardium: The pericardium has a fat pad. Past Medical History:  Diagnosis Date   -+    Stent placed; [place]   Arthritis    Atrial fibrillation ([place])    Cancer [facility])    testicular cancer   Cataract, left eye    Congestive heart failure (CHF) (HCC)    DVT (deep venous thrombosis) (HCC)    High cholesterol    Pneumonia    Psychosis ([place])    Pulmonary embolus (HCC)     Past Surgical History:  Procedure Laterality Date   HERNIA REPAIR     IVC  2014   lamenectomy     left orchiectomy      MEDICATIONS:  amitriptyline (ELAVIL) 50 MG tablet   Biotin 5000 MCG CAPS   carvedilol (COREG) 25 MG tablet   Coenzyme Q10 (COQ-10 PO)   cyclobenzaprine (FLEXERIL) 10 MG tablet   diltiazem (CARDIZEM CD) 120 MG 24 hr capsule   diltiazem (TIAZAC) 120 MG 24 hr capsule   enoxaparin (LOVENOX) 40 MG/0.4ML injection   fenofibrate 160 MG tablet   GLUCOSAMINE CHONDROITIN COMPLX PO   Multiple Vitamin (MULTIVITAMIN) capsule   NEEDLE,  DISP, 18 G 18G X 1-1/2" MISC   Probiotic Product (PROBIOTIC DAILY PO)   rosuvastatin (CRESTOR) 40 MG tablet   Syringe/Needle, Disp, (SYRINGE 3CC/22GX1") 22G X 1" 3 ML MISC   testosterone cypionate (DEPOTESTOSTERONE CYPIONATE) 200 MG/ML injection   topiramate (TOPAMAX) 100 MG tablet   traMADol (ULTRAM) 50 MG tablet   Vitamin K, Phytonadione, 100 MCG TABS   warfarin (COUMADIN) 7.5 MG tablet   No current facility-administered medications for this encounter.    Konrad Felix Ward, PA-C [facility] [phone]

## 2021-06-09 ENCOUNTER — Encounter (HOSPITAL_COMMUNITY)
Admission: RE | Disposition: A | Discharge status: Home Health | Payer: Self-pay | Source: Ambulatory Visit | Attending: Orthopedic Surgery

## 2021-06-09 ENCOUNTER — Ambulatory Visit (HOSPITAL_BASED_OUTPATIENT_CLINIC_OR_DEPARTMENT_OTHER): Payer: Medicare Other | Admitting: Anesthesiology

## 2021-06-09 ENCOUNTER — Ambulatory Visit (HOSPITAL_COMMUNITY): Payer: Medicare Other

## 2021-06-09 ENCOUNTER — Observation Stay (HOSPITAL_COMMUNITY)
Admission: RE | Admit: 2021-06-09 | Discharge: 2021-06-12 | Disposition: A | Discharge status: Home Health | Payer: Medicare Other | Source: Ambulatory Visit | Attending: Orthopedic Surgery | Admitting: Orthopedic Surgery

## 2021-06-09 ENCOUNTER — Encounter (HOSPITAL_COMMUNITY): Payer: Self-pay | Admitting: Orthopedic Surgery

## 2021-06-09 ENCOUNTER — Other Ambulatory Visit: Payer: Self-pay

## 2021-06-09 ENCOUNTER — Ambulatory Visit (HOSPITAL_COMMUNITY): Payer: Medicare Other | Admitting: Physician Assistant

## 2021-06-09 DIAGNOSIS — Z471 Aftercare following joint replacement surgery: Secondary | ICD-10-CM | POA: Diagnosis not present

## 2021-06-09 DIAGNOSIS — Z01818 Encounter for other preprocedural examination: Secondary | ICD-10-CM

## 2021-06-09 DIAGNOSIS — I110 Hypertensive heart disease with heart failure: Secondary | ICD-10-CM

## 2021-06-09 DIAGNOSIS — I509 Heart failure, unspecified: Secondary | ICD-10-CM | POA: Diagnosis not present

## 2021-06-09 DIAGNOSIS — Z87891 Personal history of nicotine dependence: Secondary | ICD-10-CM | POA: Diagnosis not present

## 2021-06-09 DIAGNOSIS — Z86711 Personal history of pulmonary embolism: Secondary | ICD-10-CM | POA: Diagnosis not present

## 2021-06-09 DIAGNOSIS — M1612 Unilateral primary osteoarthritis, left hip: Principal | ICD-10-CM | POA: Diagnosis present

## 2021-06-09 DIAGNOSIS — I2510 Atherosclerotic heart disease of native coronary artery without angina pectoris: Secondary | ICD-10-CM | POA: Diagnosis not present

## 2021-06-09 DIAGNOSIS — Z7901 Long term (current) use of anticoagulants: Secondary | ICD-10-CM

## 2021-06-09 DIAGNOSIS — Z96642 Presence of left artificial hip joint: Secondary | ICD-10-CM | POA: Diagnosis not present

## 2021-06-09 DIAGNOSIS — Z8547 Personal history of malignant neoplasm of testis: Secondary | ICD-10-CM | POA: Diagnosis not present

## 2021-06-09 DIAGNOSIS — E119 Type 2 diabetes mellitus without complications: Secondary | ICD-10-CM | POA: Diagnosis not present

## 2021-06-09 DIAGNOSIS — Z79899 Other long term (current) drug therapy: Secondary | ICD-10-CM | POA: Diagnosis not present

## 2021-06-09 HISTORY — PX: TOTAL HIP ARTHROPLASTY: SHX124

## 2021-06-09 LAB — PROTIME-INR
INR: 1 (ref 0.8–1.2)
Prothrombin Time: 13.5 seconds (ref 11.4–15.2)

## 2021-06-09 LAB — GLUCOSE, CAPILLARY: Glucose-Capillary: 188 mg/dL — ABNORMAL HIGH (ref 70–99)

## 2021-06-09 LAB — ABO/RH: ABO/RH(D): B POS

## 2021-06-09 SURGERY — ARTHROPLASTY, HIP, TOTAL, ANTERIOR APPROACH
Anesthesia: Spinal | Site: Hip | Laterality: Left

## 2021-06-09 MED ORDER — DILTIAZEM HCL ER BEADS 120 MG PO CP24: 120.0000 mg | ORAL_CAPSULE | Freq: Every day | ORAL | Status: DC

## 2021-06-09 MED ORDER — OXYCODONE HCL 5 MG PO TABS
ORAL_TABLET | ORAL | Status: AC
  Filled 2021-06-09: qty 1

## 2021-06-09 MED ORDER — ACETAMINOPHEN 500 MG PO TABS: 1000.0000 mg | ORAL_TABLET | Freq: Once | ORAL | Status: DC | PRN

## 2021-06-09 MED ORDER — OXYCODONE HCL 5 MG PO TABS
10.0000 mg | ORAL_TABLET | ORAL | Status: DC | PRN
  Administered 2021-06-09 – 2021-06-12 (×7): 15 mg via ORAL
  Filled 2021-06-09 (×8): qty 3

## 2021-06-09 MED ORDER — PROPOFOL 10 MG/ML IV BOLUS
INTRAVENOUS | Status: DC | PRN
  Administered 2021-06-09 (×3): 20 mg via INTRAVENOUS
  Administered 2021-06-09: 200 mg via INTRAVENOUS

## 2021-06-09 MED ORDER — SUGAMMADEX SODIUM 500 MG/5ML IV SOLN
INTRAVENOUS | Status: AC
  Filled 2021-06-09: qty 5

## 2021-06-09 MED ORDER — SODIUM CHLORIDE (PF) 0.9 % IJ SOLN
INTRAMUSCULAR | Status: AC
  Filled 2021-06-09: qty 10

## 2021-06-09 MED ORDER — FENTANYL CITRATE PF 50 MCG/ML IJ SOSY
50.0000 ug | PREFILLED_SYRINGE | Freq: Once | INTRAMUSCULAR | Status: AC
  Administered 2021-06-09: 50 ug via INTRAVENOUS
  Filled 2021-06-09: qty 1

## 2021-06-09 MED ORDER — FENOFIBRATE 160 MG PO TABS
160.0000 mg | ORAL_TABLET | Freq: Every day | ORAL | Status: DC
  Administered 2021-06-10 – 2021-06-12 (×3): 160 mg via ORAL
  Filled 2021-06-09 (×3): qty 1

## 2021-06-09 MED ORDER — SODIUM CHLORIDE 0.9 % IR SOLN
Status: DC | PRN
  Administered 2021-06-09: 1000 mL

## 2021-06-09 MED ORDER — TRANEXAMIC ACID 1000 MG/10ML IV SOLN
2000.0000 mg | Freq: Once | INTRAVENOUS | Status: AC
  Administered 2021-06-09: 2000 mg via TOPICAL
  Filled 2021-06-09: qty 20

## 2021-06-09 MED ORDER — PROPOFOL 10 MG/ML IV BOLUS
INTRAVENOUS | Status: AC
  Filled 2021-06-09: qty 20

## 2021-06-09 MED ORDER — ONDANSETRON HCL 4 MG/2ML IJ SOLN: 4.0000 mg | Freq: Four times a day (QID) | INTRAMUSCULAR | Status: DC | PRN

## 2021-06-09 MED ORDER — MIDAZOLAM HCL 2 MG/2ML IJ SOLN
INTRAMUSCULAR | Status: AC
  Filled 2021-06-09: qty 2

## 2021-06-09 MED ORDER — PHENOL 1.4 % MT LIQD: 1.0000 | OROMUCOSAL | Status: DC | PRN

## 2021-06-09 MED ORDER — TRANEXAMIC ACID-NACL 1000-0.7 MG/100ML-% IV SOLN
1000.0000 mg | INTRAVENOUS | Status: AC
  Administered 2021-06-09: 1000 mg via INTRAVENOUS
  Filled 2021-06-09: qty 100

## 2021-06-09 MED ORDER — ORAL CARE MOUTH RINSE: 15.0000 mL | Freq: Once | OROMUCOSAL | Status: AC

## 2021-06-09 MED ORDER — SUGAMMADEX SODIUM 500 MG/5ML IV SOLN
INTRAVENOUS | Status: DC | PRN
  Administered 2021-06-09: 300 mg via INTRAVENOUS

## 2021-06-09 MED ORDER — ONDANSETRON HCL 4 MG PO TABS: 4.0000 mg | ORAL_TABLET | Freq: Four times a day (QID) | ORAL | Status: DC | PRN

## 2021-06-09 MED ORDER — ROCURONIUM BROMIDE 10 MG/ML (PF) SYRINGE
PREFILLED_SYRINGE | INTRAVENOUS | Status: AC
  Filled 2021-06-09: qty 20

## 2021-06-09 MED ORDER — AMITRIPTYLINE HCL 50 MG PO TABS
50.0000 mg | ORAL_TABLET | Freq: Every day | ORAL | Status: DC
  Administered 2021-06-09 – 2021-06-11 (×3): 50 mg via ORAL
  Filled 2021-06-09 (×3): qty 1

## 2021-06-09 MED ORDER — PHENYLEPHRINE 40 MCG/ML (10ML) SYRINGE FOR IV PUSH (FOR BLOOD PRESSURE SUPPORT)
PREFILLED_SYRINGE | INTRAVENOUS | Status: DC | PRN
  Administered 2021-06-09 (×3): 80 ug via INTRAVENOUS

## 2021-06-09 MED ORDER — BUPIVACAINE-EPINEPHRINE (PF) 0.25% -1:200000 IJ SOLN
INTRAMUSCULAR | Status: AC
  Filled 2021-06-09: qty 30

## 2021-06-09 MED ORDER — OXYCODONE HCL 5 MG PO TABS
5.0000 mg | ORAL_TABLET | Freq: Once | ORAL | Status: DC | PRN
  Administered 2021-06-09: 5 mg via ORAL

## 2021-06-09 MED ORDER — TRANEXAMIC ACID-NACL 1000-0.7 MG/100ML-% IV SOLN: INTRAVENOUS | Status: DC | PRN

## 2021-06-09 MED ORDER — BUPIVACAINE LIPOSOME 1.3 % IJ SUSP
10.0000 mL | Freq: Once | INTRAMUSCULAR | Status: DC
  Filled 2021-06-09: qty 10

## 2021-06-09 MED ORDER — ACETAMINOPHEN 325 MG PO TABS: 325.0000 mg | ORAL_TABLET | Freq: Four times a day (QID) | ORAL | Status: DC | PRN

## 2021-06-09 MED ORDER — ACETAMINOPHEN 10 MG/ML IV SOLN
INTRAVENOUS | Status: DC | PRN
  Administered 2021-06-09: 1000 mg via INTRAVENOUS

## 2021-06-09 MED ORDER — BISACODYL 5 MG PO TBEC: 5.0000 mg | DELAYED_RELEASE_TABLET | Freq: Every day | ORAL | Status: DC | PRN

## 2021-06-09 MED ORDER — MENTHOL 3 MG MT LOZG: 1.0000 | LOZENGE | OROMUCOSAL | Status: DC | PRN

## 2021-06-09 MED ORDER — BUPIVACAINE HCL (PF) 0.5 % IJ SOLN
INTRAMUSCULAR | Status: AC
  Filled 2021-06-09: qty 30

## 2021-06-09 MED ORDER — FENTANYL CITRATE (PF) 100 MCG/2ML IJ SOLN
INTRAMUSCULAR | Status: DC | PRN
  Administered 2021-06-09: 100 ug via INTRAVENOUS

## 2021-06-09 MED ORDER — METHOCARBAMOL 500 MG IVPB - SIMPLE MED
INTRAVENOUS | Status: AC
Start: 2021-06-09 — End: 2021-06-10
  Filled 2021-06-09: qty 50

## 2021-06-09 MED ORDER — SODIUM CHLORIDE (PF) 0.9 % IJ SOLN
INTRAMUSCULAR | Status: DC | PRN
  Administered 2021-06-09: 50 mL via INTRAVENOUS

## 2021-06-09 MED ORDER — LACTATED RINGERS IV SOLN: INTRAVENOUS | Status: DC

## 2021-06-09 MED ORDER — MIDAZOLAM HCL 2 MG/2ML IJ SOLN
INTRAMUSCULAR | Status: DC | PRN
  Administered 2021-06-09: 2 mg via INTRAVENOUS

## 2021-06-09 MED ORDER — OXYCODONE-ACETAMINOPHEN 5-325 MG PO TABS
1.0000 | ORAL_TABLET | Freq: Four times a day (QID) | ORAL | 0 refills | Status: DC | PRN
Start: 2021-06-09 — End: 2021-11-29

## 2021-06-09 MED ORDER — ONDANSETRON HCL 4 MG/2ML IJ SOLN
INTRAMUSCULAR | Status: AC
  Filled 2021-06-09: qty 2

## 2021-06-09 MED ORDER — WARFARIN SODIUM 2.5 MG PO TABS
3.7500 mg | ORAL_TABLET | ORAL | Status: DC
  Administered 2021-06-10: 3.75 mg via ORAL
  Filled 2021-06-09: qty 1

## 2021-06-09 MED ORDER — TIZANIDINE HCL 2 MG PO TABS
2.0000 mg | ORAL_TABLET | Freq: Three times a day (TID) | ORAL | 0 refills | Status: DC | PRN
Start: 2021-06-09 — End: 2024-03-11

## 2021-06-09 MED ORDER — EPHEDRINE SULFATE (PRESSORS) 50 MG/ML IJ SOLN: INTRAMUSCULAR | Status: DC | PRN

## 2021-06-09 MED ORDER — ALUM & MAG HYDROXIDE-SIMETH 200-200-20 MG/5ML PO SUSP: 30.0000 mL | ORAL | Status: DC | PRN

## 2021-06-09 MED ORDER — CARVEDILOL 25 MG PO TABS
25.0000 mg | ORAL_TABLET | Freq: Two times a day (BID) | ORAL | Status: DC
  Administered 2021-06-09 – 2021-06-12 (×6): 25 mg via ORAL
  Filled 2021-06-09 (×6): qty 1

## 2021-06-09 MED ORDER — HYDROMORPHONE HCL 1 MG/ML IJ SOLN
0.5000 mg | INTRAMUSCULAR | Status: DC | PRN
  Administered 2021-06-09 – 2021-06-10 (×4): 1 mg via INTRAVENOUS
  Filled 2021-06-09 (×4): qty 1

## 2021-06-09 MED ORDER — BUPIVACAINE-EPINEPHRINE 0.25% -1:200000 IJ SOLN
INTRAMUSCULAR | Status: DC | PRN
  Administered 2021-06-09: 30 mL

## 2021-06-09 MED ORDER — POVIDONE-IODINE 10 % EX SWAB
2.0000 "application " | Freq: Once | CUTANEOUS | Status: AC
  Administered 2021-06-09: 2 via TOPICAL

## 2021-06-09 MED ORDER — ACETAMINOPHEN 10 MG/ML IV SOLN
INTRAVENOUS | Status: AC
  Filled 2021-06-09: qty 100

## 2021-06-09 MED ORDER — ACETAMINOPHEN 160 MG/5ML PO SOLN: 1000.0000 mg | Freq: Once | ORAL | Status: DC | PRN

## 2021-06-09 MED ORDER — WARFARIN SODIUM 7.5 MG PO TABS
7.5000 mg | ORAL_TABLET | ORAL | Status: DC
Start: 2021-06-09 — End: 2021-06-12
  Administered 2021-06-09 – 2021-06-11 (×2): 7.5 mg via ORAL
  Filled 2021-06-09 (×3): qty 1

## 2021-06-09 MED ORDER — DEXAMETHASONE SODIUM PHOSPHATE 10 MG/ML IJ SOLN
10.0000 mg | Freq: Two times a day (BID) | INTRAMUSCULAR | Status: AC
  Administered 2021-06-09 – 2021-06-10 (×2): 10 mg via INTRAVENOUS
  Filled 2021-06-09 (×2): qty 1

## 2021-06-09 MED ORDER — BUPIVACAINE LIPOSOME 1.3 % IJ SUSP
INTRAMUSCULAR | Status: DC | PRN
  Administered 2021-06-09: 20 mL

## 2021-06-09 MED ORDER — ROSUVASTATIN CALCIUM 20 MG PO TABS
40.0000 mg | ORAL_TABLET | Freq: Every day | ORAL | Status: DC
  Administered 2021-06-10 – 2021-06-12 (×3): 40 mg via ORAL
  Filled 2021-06-09 (×3): qty 2

## 2021-06-09 MED ORDER — HYDROMORPHONE HCL 2 MG/ML IJ SOLN
INTRAMUSCULAR | Status: AC
  Filled 2021-06-09: qty 1

## 2021-06-09 MED ORDER — ROCURONIUM BROMIDE 10 MG/ML (PF) SYRINGE
PREFILLED_SYRINGE | INTRAVENOUS | Status: DC | PRN
  Administered 2021-06-09: 100 mg via INTRAVENOUS
  Administered 2021-06-09 (×2): 20 mg via INTRAVENOUS

## 2021-06-09 MED ORDER — TOPIRAMATE 100 MG PO TABS
100.0000 mg | ORAL_TABLET | Freq: Two times a day (BID) | ORAL | Status: DC
  Administered 2021-06-09 – 2021-06-12 (×6): 100 mg via ORAL
  Filled 2021-06-09 (×6): qty 1

## 2021-06-09 MED ORDER — METHOCARBAMOL 500 MG IVPB - SIMPLE MED
500.0000 mg | Freq: Four times a day (QID) | INTRAVENOUS | Status: DC | PRN
  Administered 2021-06-09: 500 mg via INTRAVENOUS
  Filled 2021-06-09: qty 50

## 2021-06-09 MED ORDER — TRANEXAMIC ACID-NACL 1000-0.7 MG/100ML-% IV SOLN
1000.0000 mg | Freq: Once | INTRAVENOUS | Status: AC
  Administered 2021-06-09: 1000 mg via INTRAVENOUS
  Filled 2021-06-09: qty 100

## 2021-06-09 MED ORDER — WARFARIN - PHYSICIAN DOSING INPATIENT: Freq: Every day | Status: DC

## 2021-06-09 MED ORDER — METHOCARBAMOL 500 MG PO TABS
500.0000 mg | ORAL_TABLET | Freq: Four times a day (QID) | ORAL | Status: DC | PRN
  Administered 2021-06-09 – 2021-06-10 (×3): 500 mg via ORAL
  Filled 2021-06-09 (×3): qty 1

## 2021-06-09 MED ORDER — FENTANYL CITRATE (PF) 100 MCG/2ML IJ SOLN
INTRAMUSCULAR | Status: AC
  Filled 2021-06-09: qty 2

## 2021-06-09 MED ORDER — DOCUSATE SODIUM 100 MG PO CAPS
100.0000 mg | ORAL_CAPSULE | Freq: Two times a day (BID) | ORAL | Status: DC
  Administered 2021-06-09 – 2021-06-12 (×6): 100 mg via ORAL
  Filled 2021-06-09 (×6): qty 1

## 2021-06-09 MED ORDER — DILTIAZEM HCL ER COATED BEADS 120 MG PO CP24
120.0000 mg | ORAL_CAPSULE | Freq: Every day | ORAL | Status: DC
Start: 2021-06-10 — End: 2021-06-12
  Administered 2021-06-10 – 2021-06-12 (×3): 120 mg via ORAL
  Filled 2021-06-09 (×3): qty 1

## 2021-06-09 MED ORDER — CEFAZOLIN IN SODIUM CHLORIDE 3-0.9 GM/100ML-% IV SOLN
3.0000 g | INTRAVENOUS | Status: AC
  Administered 2021-06-09: 3 g via INTRAVENOUS
  Filled 2021-06-09: qty 100

## 2021-06-09 MED ORDER — CEFAZOLIN SODIUM-DEXTROSE 2-4 GM/100ML-% IV SOLN
2.0000 g | Freq: Four times a day (QID) | INTRAVENOUS | Status: AC
  Administered 2021-06-09 – 2021-06-10 (×2): 2 g via INTRAVENOUS
  Filled 2021-06-09 (×2): qty 100

## 2021-06-09 MED ORDER — TRAMADOL HCL 50 MG PO TABS
50.0000 mg | ORAL_TABLET | Freq: Four times a day (QID) | ORAL | Status: DC
  Administered 2021-06-09 – 2021-06-12 (×12): 50 mg via ORAL
  Filled 2021-06-09 (×12): qty 1

## 2021-06-09 MED ORDER — EPHEDRINE SULFATE-NACL 50-0.9 MG/10ML-% IV SOSY
PREFILLED_SYRINGE | INTRAVENOUS | Status: DC | PRN
  Administered 2021-06-09 (×2): 5 mg via INTRAVENOUS

## 2021-06-09 MED ORDER — ONDANSETRON HCL 4 MG/2ML IJ SOLN
INTRAMUSCULAR | Status: DC | PRN
  Administered 2021-06-09: 4 mg via INTRAVENOUS

## 2021-06-09 MED ORDER — SODIUM CHLORIDE 0.9 % IV SOLN: INTRAVENOUS | Status: DC

## 2021-06-09 MED ORDER — FENTANYL CITRATE PF 50 MCG/ML IJ SOSY
PREFILLED_SYRINGE | INTRAMUSCULAR | Status: AC
  Filled 2021-06-09: qty 2

## 2021-06-09 MED ORDER — DIPHENHYDRAMINE HCL 12.5 MG/5ML PO ELIX: 12.5000 mg | ORAL_SOLUTION | ORAL | Status: DC | PRN

## 2021-06-09 MED ORDER — HYDROMORPHONE HCL 1 MG/ML IJ SOLN
INTRAMUSCULAR | Status: DC | PRN
  Administered 2021-06-09: .4 mg via INTRAVENOUS
  Administered 2021-06-09 (×2): .2 mg via INTRAVENOUS
  Administered 2021-06-09 (×2): .4 mg via INTRAVENOUS

## 2021-06-09 MED ORDER — FENTANYL CITRATE PF 50 MCG/ML IJ SOSY
25.0000 ug | PREFILLED_SYRINGE | INTRAMUSCULAR | Status: DC | PRN
  Administered 2021-06-09 (×2): 50 ug via INTRAVENOUS

## 2021-06-09 MED ORDER — POLYETHYLENE GLYCOL 3350 17 G PO PACK: 17.0000 g | PACK | Freq: Every day | ORAL | Status: DC | PRN

## 2021-06-09 MED ORDER — ACETAMINOPHEN 10 MG/ML IV SOLN: 1000.0000 mg | Freq: Once | INTRAVENOUS | Status: DC | PRN

## 2021-06-09 MED ORDER — CHLORHEXIDINE GLUCONATE 0.12 % MT SOLN
15.0000 mL | Freq: Once | OROMUCOSAL | Status: AC
  Administered 2021-06-09: 15 mL via OROMUCOSAL

## 2021-06-09 MED ORDER — OXYCODONE HCL 5 MG/5ML PO SOLN: 5.0000 mg | Freq: Once | ORAL | Status: DC | PRN

## 2021-06-09 MED ORDER — PROPOFOL 500 MG/50ML IV EMUL
INTRAVENOUS | Status: AC
  Filled 2021-06-09: qty 50

## 2021-06-09 MED ORDER — DOCUSATE SODIUM 100 MG PO CAPS: 100.0000 mg | ORAL_CAPSULE | Freq: Two times a day (BID) | ORAL | 0 refills | Status: AC

## 2021-06-09 SURGICAL SUPPLY — 42 items
BAG COUNTER SPONGE SURGICOUNT (BAG) IMPLANT
BAG ZIPLOCK 12X15 (MISCELLANEOUS) IMPLANT
BENZOIN TINCTURE PRP APPL 2/3 (GAUZE/BANDAGES/DRESSINGS) ×1 IMPLANT
BLADE SAW SGTL 18X1.27X75 (BLADE) ×2 IMPLANT
BLADE SURG SZ10 CARB STEEL (BLADE) ×4 IMPLANT
CLSR STERI-STRIP ANTIMIC 1/2X4 (GAUZE/BANDAGES/DRESSINGS) ×1 IMPLANT
COVER PERINEAL POST (MISCELLANEOUS) ×2 IMPLANT
COVER SURGICAL LIGHT HANDLE (MISCELLANEOUS) ×2 IMPLANT
DRAPE FOOT SWITCH (DRAPES) ×2 IMPLANT
DRAPE STERI IOBAN 125X83 (DRAPES) ×2 IMPLANT
DRAPE U-SHAPE 47X51 STRL (DRAPES) ×4 IMPLANT
DRSG AQUACEL AG ADV 3.5X 6 (GAUZE/BANDAGES/DRESSINGS) ×2 IMPLANT
DURAPREP 26ML APPLICATOR (WOUND CARE) ×2 IMPLANT
ELECT BLADE TIP CTD 4 INCH (ELECTRODE) ×2 IMPLANT
ELECT REM PT RETURN 15FT ADLT (MISCELLANEOUS) ×2 IMPLANT
ELIMINATOR HOLE APEX DEPUY (Hips) ×1 IMPLANT
GAUZE XEROFORM 1X8 LF (GAUZE/BANDAGES/DRESSINGS) IMPLANT
GLOVE SRG 8 PF TXTR STRL LF DI (GLOVE) ×2 IMPLANT
GLOVE SURG NEOP MICRO LF SZ7.5 (GLOVE) ×4 IMPLANT
GLOVE SURG UNDER POLY LF SZ8 (GLOVE) ×2
GOWN STRL REUS W/TWL XL LVL3 (GOWN DISPOSABLE) ×4 IMPLANT
HEAD CERAMIC 36 PLUS5 (Hips) ×1 IMPLANT
HOLDER FOLEY CATH W/STRAP (MISCELLANEOUS) ×2 IMPLANT
HOOD PEEL AWAY FLYTE STAYCOOL (MISCELLANEOUS) ×4 IMPLANT
KIT TURNOVER KIT A (KITS) IMPLANT
LINER NEUTRAL 52MMX36MMX56N (Liner) ×1 IMPLANT
NEEDLE HYPO 22GX1.5 SAFETY (NEEDLE) ×2 IMPLANT
PACK ANTERIOR HIP CUSTOM (KITS) ×2 IMPLANT
PENCIL SMOKE EVACUATOR (MISCELLANEOUS) IMPLANT
PIN SECT CUP 56MM (Hips) ×1 IMPLANT
SPIKE FLUID TRANSFER (MISCELLANEOUS) ×2 IMPLANT
SPONGE T-LAP 18X18 ~~LOC~~+RFID (SPONGE) ×6 IMPLANT
STAPLER VISISTAT 35W (STAPLE) IMPLANT
STEM CORAIL KA14 (Stem) ×1 IMPLANT
STRIP CLOSURE SKIN 1/2X4 (GAUZE/BANDAGES/DRESSINGS) IMPLANT
SUT ETHIBOND NAB CT1 #1 30IN (SUTURE) ×4 IMPLANT
SUT MNCRL AB 3-0 PS2 18 (SUTURE) IMPLANT
SUT VIC AB 0 CT1 36 (SUTURE) ×2 IMPLANT
SUT VIC AB 1 CT1 36 (SUTURE) ×2 IMPLANT
SUT VIC AB 2-0 CT1 27 (SUTURE) ×1
SUT VIC AB 2-0 CT1 TAPERPNT 27 (SUTURE) ×1 IMPLANT
TRAY FOLEY MTR SLVR 16FR STAT (SET/KITS/TRAYS/PACK) ×2 IMPLANT

## 2021-06-09 NOTE — H&P (Signed)
TOTAL HIP ADMISSION H&P  Patient is admitted for left total hip arthroplasty.  Subjective:  Chief Complaint: left hip pain  HPI: Jeff Green, 66 y.o. male, has a history of pain and functional disability in the left hip(s) due to arthritis and patient has failed non-surgical conservative treatments for greater than 12 weeks to include NSAID's and/or analgesics, supervised PT with diminished ADL's post treatment, use of assistive devices, weight reduction as appropriate, and activity modification.  Onset of symptoms was gradual starting 3 years ago with gradually worsening course since that time.The patient noted no past surgery on the left hip(s).  Patient currently rates pain in the left hip at 9 out of 10 with activity. Patient has night pain, worsening of pain with activity and weight bearing, trendelenberg gait, pain that interfers with activities of daily living, and pain with passive range of motion. Patient has evidence of subchondral cysts, subchondral sclerosis, periarticular osteophytes, and joint space narrowing by imaging studies. This condition presents safety issues increasing the risk of falls. This patient has had  they have all reasonable conservative care .  There is no current active infection.  Patient Active Problem List   Diagnosis Date Noted   Anticoagulated on Coumadin 05/16/2021   Left hip pain 02/17/2021   Shingles 12/19/2020   Primary osteoarthritis of right knee 09/06/2020   H/O hernia repair 06/20/2020   Intertrigo 03/21/2020   Acute irritant rhinitis 02/10/2020   COVID-19 lung 01/20/2020   Thrombocytopenia ([place]) 08/12/2018   Liver lesion 08/12/2018   Lung nodule 08/12/2018   Presence of IVC filter 08/06/2018   Hypercalcemia due to a drug 07/30/2018   DVT (deep venous thrombosis) ([place]) 07/29/2018   Renal insufficiency 06/27/2018   Seborrheic keratosis 05/29/2017   Insomnia 04/05/2017   OSA (obstructive sleep apnea) 03/21/2017   Primary osteoarthritis of  both shoulders 11/28/2016   Controlled type 2 diabetes mellitus without complication, without long-term current use of insulin ([place]) 06/08/2016   Seborrheic dermatitis 06/08/2016   Morbid obesity ([place]) 06/08/2016   Ruptured tympanic membrane, bilateral 01/27/2016   Annual physical exam 01/25/2016   Paroxysmal atrial fibrillation ([place]) 06/15/2015   Polycythemia, secondary 08/26/2014   Hemochromatosis 08/26/2014   Multiple pulmonary nodules 08/16/2014   Testicular cancer ([place]) 08/12/2014   Hyperlipidemia 08/10/2014   Hypogonadism in male 08/10/2014   Essential hypertension 08/10/2014   Spinal stenosis of lumbar region 08/10/2014   History of pulmonary embolism 08/10/2014   CAD (coronary artery disease), native coronary artery 08/10/2014   Past Medical History:  Diagnosis Date   -+    Stent placed; [place]   Arthritis    Atrial fibrillation ([place])    Cancer [facility])    testicular cancer   Cataract, left eye    Congestive heart failure (CHF) ([place])    DVT (deep venous thrombosis) (HCC)    High cholesterol    Pneumonia    Psychosis ([place])    Pulmonary embolus (HCC)     Past Surgical History:  Procedure Laterality Date   HERNIA REPAIR     IVC  2014   lamenectomy     left orchiectomy      Current Facility-Administered Medications  Medication Dose Route Frequency Provider Last Rate Last Admin   bupivacaine liposome (EXPAREL) 1.3 % injection 133 mg  10 mL Other Once Dorna Leitz, MD       Current Outpatient Medications  Medication Sig Dispense Refill Last Dose   amitriptyline (ELAVIL) 50 MG tablet TAKE ONE-HALF TABLET  BY MOUTH AT BEDTIME FOR A WEEK, THEN 1 TABLET AT BEDITME 90 tablet 3    Biotin 5000 MCG CAPS Take 5,000 mcg by mouth daily.      carvedilol (COREG) 25 MG tablet TAKE ONE TABLET BY MOUTH TWICE A DAY WITH A MEAL 180 tablet 2    Coenzyme Q10 (COQ-10 PO) Take 200 mg by mouth daily.      cyclobenzaprine (FLEXERIL) 10 MG tablet One half to one tab PO qHS, then  increase gradually to one tab TID. (Patient taking differently: Take 5-10 mg by mouth 3 (three) times daily as needed for muscle spasms.) 30 tablet 0    diltiazem (CARDIZEM CD) 120 MG 24 hr capsule TAKE ONE CAPSULE BY MOUTH DAILY 90 capsule 3    fenofibrate 160 MG tablet TAKE ONE TABLET BY MOUTH DAILY 90 tablet 3    GLUCOSAMINE CHONDROITIN COMPLX PO Take 2 tablets by mouth daily.      Multiple Vitamin (MULTIVITAMIN) capsule Take 1 capsule by mouth daily.      Probiotic Product (PROBIOTIC DAILY PO) Take 1 capsule by mouth.      rosuvastatin (CRESTOR) 40 MG tablet Take 1 tablet (40 mg total) by mouth daily. 90 tablet 3    testosterone cypionate (DEPOTESTOSTERONE CYPIONATE) 200 MG/ML injection 15mL IM every other week. 10 mL 3    topiramate (TOPAMAX) 100 MG tablet Take 1 tablet (100 mg total) by mouth 2 (two) times daily. 180 tablet 2    warfarin (COUMADIN) 7.5 MG tablet TAKE ONE TABLET BY MOUTH DAILY (Patient taking differently: Take 3.75-7.5 mg by mouth See admin instructions. Take 3.75 mg by mouth on Wednesday and Saturday, all remaining days take 7.5 mg) 30 tablet 3    diltiazem (TIAZAC) 120 MG 24 hr capsule TAKE ONE CAPSULE BY MOUTH DAILY 90 capsule 0 Not Taking   enoxaparin (LOVENOX) 40 MG/0.4ML injection Inject 40 mg subcutaneous daily as directed 120 mL 0    NEEDLE, DISP, 18 G 18G X 1-1/2" MISC Use as needed for testosterone injections 100 each 11    Syringe/Needle, Disp, (SYRINGE 3CC/22GX1") 22G X 1" 3 ML MISC Use as needed for testosterone injections 100 each 11    traMADol (ULTRAM) 50 MG tablet TAKE ONE TO TWO TABLETS BY MOUTH EVERY 8 HOURS AS NEEDED FOR MODERATE PAIN, MAX DAILY DOSE OF 6 TABLETS PER DAY 30 tablet 0    Vitamin K, Phytonadione, 100 MCG TABS 1 tab p.o. x1 1 tablet 0    Allergies  Allergen Reactions   Lipitor [Atorvastatin]     myalgias    Social History   Tobacco Use   Smoking status: Former    Types: Cigarettes    Quit date: 08/10/1999    Years since quitting: 21.8    Smokeless tobacco: Never  Substance Use Topics   Alcohol use: Yes    Alcohol/week: 0.0 standard drinks    Comment: Rare    Family History  Problem Relation Age of Onset   Thyroid cancer Mother    Depression Mother    High Cholesterol Father    High blood pressure Father    Heart disease Father        Atrial fibrillation   Stroke Other        Grandfather     Review of Systems ROS: I have reviewed the patient's review of systems thoroughly and there are no positive responses as relates to the HPI.  Objective:  Physical Exam  Vital signs in last 24  hours:   Well-developed well-nourished patient in no acute distress. Alert and oriented x3 HEENT:within normal limits Cardiac: Regular rate and rhythm Pulmonary: Lungs clear to auscultation Abdomen: Soft and nontender.  Normal active bowel sounds  Musculoskeletal: (Left hip: Painful range of motion.  Limited range of motion.  No instability.  Pain with internal and external rotation.  Neuro vas intact distally. Labs: Recent Results (from the past 2160 hour(s))  POCT INR     Status: Abnormal   Collection Time: 03/21/21  9:33 AM  Result Value Ref Range   INR 4.0 (A) 2 - 3  POCT INR     Status: None   Collection Time: 04/11/21  9:26 AM  Result Value Ref Range   INR 2.7 2.0 - 3.0  POCT INR     Status: Abnormal   Collection Time: 05/16/21  9:53 AM  Result Value Ref Range   INR 3.6 (A) 2.0 - 3.0  POCT INR     Status: Abnormal   Collection Time: 05/24/21 12:00 AM  Result Value Ref Range   INR 3.1 (A) 2.0 - 3.0  Glucose, capillary     Status: Abnormal   Collection Time: 05/29/21 10:37 AM  Result Value Ref Range   Glucose-Capillary 169 (H) 70 - 99 mg/dL    Comment: Glucose reference range applies only to samples taken after fasting for at least 8 hours.  Surgical PCR Screen     Status: None   Collection Time: 05/29/21 10:41 AM   Specimen: Nasal Mucosa; Nasal Swab  Result Value Ref Range   MRSA, PCR NEGATIVE NEGATIVE    Staphylococcus aureus NEGATIVE NEGATIVE    Comment: (NOTE) The Xpert SA Assay (FDA approved for NASAL specimens in patients 62 years of age and older), is one component of a comprehensive surveillance program. It is not intended to diagnose infection nor to guide or monitor treatment. Performed at [facility], [place] [address], [place], [place] [address]   Basic metabolic panel per protocol     Status: Abnormal   Collection Time: 05/29/21 10:41 AM  Result Value Ref Range   Sodium 139 135 - 145 mmol/L   Potassium 4.0 3.5 - 5.1 mmol/L   Chloride 108 98 - 111 mmol/L   CO2 23 22 - 32 mmol/L   Glucose, Bld 161 (H) 70 - 99 mg/dL    Comment: Glucose reference range applies only to samples taken after fasting for at least 8 hours.   BUN 30 (H) 8 - 23 mg/dL   Creatinine, Ser 1.36 (H) 0.61 - 1.24 mg/dL   Calcium 10.2 8.9 - 10.3 mg/dL   GFR, Estimated 58 (L) >60 mL/min    Comment: (NOTE) Calculated using the CKD-EPI Creatinine Equation (2021)    Anion gap 8 5 - 15    Comment: Performed at [facility], [place] [address], [place], [place] [address]  CBC per protocol     Status: None   Collection Time: 05/29/21 10:41 AM  Result Value Ref Range   WBC 6.7 4.0 - 10.5 K/uL   RBC 4.79 4.22 - 5.81 MIL/uL   Hemoglobin 14.5 13.0 - 17.0 g/dL   HCT 43.8 39.0 - 52.0 %   MCV 91.4 80.0 - 100.0 fL   MCH 30.3 26.0 - 34.0 pg   MCHC 33.1 30.0 - 36.0 g/dL   RDW 14.1 11.5 - 15.5 %   Platelets 161 150 - 400 K/uL   nRBC 0.0 0.0 - 0.2 %  Comment: Performed at [facility], [place] [address], [place], [place] [address]  Type and screen Order type and screen if day of surgery is less than 15 days from draw of preadmission visit or order morning of surgery if day of surgery is greater than 6 days from preadmission visit.     Status: None   Collection Time: 05/29/21 10:41 AM  Result Value Ref Range   ABO/RH(D) B POS    Antibody Screen NEG    Sample  Expiration 06/12/2021,2359    Extend sample reason      NO TRANSFUSIONS OR PREGNANCY IN THE PAST 3 MONTHS Performed at [facility], [place] [address] [place], [place] [address]   Hemoglobin A1c     Status: Abnormal   Collection Time: 05/29/21 10:46 AM  Result Value Ref Range   Hgb A1c MFr Bld 6.8 (H) 4.8 - 5.6 %    Comment: (NOTE) Pre diabetes:          5.7%-6.4%  Diabetes:              >6.4%  Glycemic control for   <7.0% adults with diabetes    Mean Plasma Glucose 148.46 mg/dL    Comment: Performed at [facility] [address], [place], [place] [address]     Estimated body mass index is 36.17 kg/m as calculated from the following:   Height as of 06/06/21: 6' 2.5" (1.892 m).   Weight as of 06/06/21: 129.5 kg.   Imaging Review Plain radiographs demonstrate severe degenerative joint disease of the left hip(s). The bone quality appears to be fair for age and reported activity level.      Assessment/Plan:  End stage arthritis, left hip(s)  The patient history, physical examination, clinical judgement of the provider and imaging studies are consistent with end stage degenerative joint disease of the left hip(s) and total hip arthroplasty is deemed medically necessary. The treatment options including medical management, injection therapy, arthroscopy and arthroplasty were discussed at length. The risks and benefits of total hip arthroplasty were presented and reviewed. The risks due to aseptic loosening, infection, stiffness, dislocation/subluxation,  thromboembolic complications and other imponderables were discussed.  The patient acknowledged the explanation, agreed to proceed with the plan and consent was signed. Patient is being admitted for inpatient treatment for surgery, pain control, PT, OT, prophylactic antibiotics, VTE prophylaxis, progressive ambulation and ADL's and discharge planning.The patient is planning to be discharged home with home health  services

## 2021-06-09 NOTE — Op Note (Signed)
PATIENT ID:      Jeff Green  MRN:     [id number] DOB/AGE:    64-Dec-1957 / 64 y.o.       OPERATIVE REPORT    DATE OF PROCEDURE:  06/09/2021       PREOPERATIVE DIAGNOSIS:  LEFT HIP OSTEOARTHRITIS                                                       Estimated body mass index is 36.17 kg/m as calculated from the following:   Height as of this encounter: 6' 2.5" (1.892 m).   Weight as of this encounter: 129.5 kg.     POSTOPERATIVE DIAGNOSIS:  left hip osteoarthritis                                                           PROCEDURE:  1. left total hip arthroplasty using a 56 mm DePuy Pinnacle  Cup, [employer],  neutral liner, a +5 36 mm ceramic head,  and a #14  Corail stem, 2.interpretation of multiple intraoperative fluoroscopic images   SURGEON: Alta Corning    ASSISTANT:   Gaspar Skeeters PA-C  (present throughout entire procedure and necessary for timely completion of the procedure)  ANESTHESIA: general BLOOD LOSS: 500 Tranexamic Acid: 1 gram IV DRAINS: None COMPLICATIONS: None    NDICATIONS FOR PROCEDURE:Patient with end-stage arthritis of the right hip.  X-rays show bone-on-bone arthritic changes. Despite conservative measures with observation, anti-inflammatory medicine, narcotics, use of a cane, has severe unremitting pain and can ambulate only less than 1 block before resting.  Patient desires elective right total hip arthroplasty to decrease pain and increase function. The risks, benefits, and alternatives were discussed at length including but not limited to the risks of infection, bleeding, nerve injury, stiffness, blood clots, the need for revision surgery, cardiopulmonary complications, among others, and they were willing to proceed.Benefits have been discussed. Questions answered.     PROCEDURE IN DETAIL: The patient was identified by armband,  received preoperative IV antibiotics in the holding area at [facility], taken to the operating room ,  appropriate anesthetic monitors  were attached and spinal anesthesia was induced.  The patient was placed onto the hot bed and all bony prominences were well-padded.The left hip was prepped and draped for an anterior approach to the hip.  An incision was made and the subcutaneous dissection was down to the level of the tensor fascia.  The fascia was opened and finger dissected.  The bleeders coming across the anterior portion of the hip were identified and cauterized. Retractors were put in place above and below the femoral neck.  The capsule was opened and tagged and a provisional neck cut was made.  The head was removed and sized on the back table.  The acetabulum was sequentially reamed to a level of 55 mm and a 57mm porous-coated pinnacle cup was hammered into place with 45 of lateral opening and 30 of anteversion.fluoroscopy was used to ensure this position of the cup.  Attention was turned towards the femur where the leg was actually rotated, extended, and adduction did.  The femur was sequentially broached until a size of 14 broach gave a perfect fit and fill.at this point a +5 mm delta ceramic hip ball was placed and the hip reduced.  Fluoroscopic images were taken to assess the leg length, fit and fill of the stem, and cup position.  We were happy with the construct at this point.  The 14 broach was removed and a final Corrail stem with standard offset  and a +5 mm ceramic hip ball was placed and reduced.  Final images were taken to make certain there were happy with the position at this point.   The capsule was closed with #1 Vicryl suture.  The tensor fascia was closed with 0 Vicryl suture.  The skin was then closed with combination of 0 and 2-0 Vicryl suture.  The top layer was with 3-0 Monocryl suture.  Benzoin and Steri-Strips were applied  and a sterile compressive dressing was applied and the patient taken to recovery room she noted be in satisfactory condition. Estimated blood loss  for the  procedure was approximately 500 cc.  Of note jim Modena Slater was with the entire case and assisted by retraction of tissues, manipulation of the leg, and closing the minimize or time.    Alta Corning 10/16/2020, 6:15 PM  Alta Corning 06/09/2021, 3:53 PM

## 2021-06-09 NOTE — Anesthesia Postprocedure Evaluation (Signed)
Anesthesia Post Note  Patient: Jeff Green  Procedure(s) Performed: TOTAL HIP ARTHROPLASTY ANTERIOR APPROACH (Left: Hip)     Patient location during evaluation: PACU Anesthesia Type: Spinal and MAC Level of consciousness: awake and alert and oriented Pain management: pain level controlled Vital Signs Assessment: post-procedure vital signs reviewed and stable Respiratory status: spontaneous breathing, nonlabored ventilation and respiratory function stable Cardiovascular status: blood pressure returned to baseline and stable Postop Assessment: no headache, no backache, spinal receding and no apparent nausea or vomiting Anesthetic complications: no   No notable events documented.  Last Vitals:  Vitals:   06/09/21 1730 06/09/21 1745  BP: 126/89 (!) 121/92  Pulse: (!) 57 60  Resp: 16 12  Temp:  36.4 C  SpO2: 100% 100%    Last Pain:  Vitals:   06/09/21 1745  TempSrc:   PainSc: 10-Worst pain ever                 Pervis Hocking

## 2021-06-09 NOTE — Transfer of Care (Signed)
Immediate Anesthesia Transfer of Care Note  Patient: Jeff Green  Procedure(s) Performed: TOTAL HIP ARTHROPLASTY ANTERIOR APPROACH (Left: Hip)  Patient Location: PACU  Anesthesia Type:General  Level of Consciousness: awake and sedated  Airway & Oxygen Therapy: Patient Spontanous Breathing and Patient connected to face mask oxygen  Post-op Assessment: Report given to RN and Post -op Vital signs reviewed and stable  Post vital signs: Reviewed and stable  Last Vitals:  Vitals Value Taken Time  BP 127/83 06/09/21 1631  Temp    Pulse 65 06/09/21 1634  Resp    SpO2 100 % 06/09/21 1634  Vitals shown include unvalidated device data.  Last Pain:  Vitals:   06/09/21 1306  TempSrc:   PainSc: 5       Patients Stated Pain Goal: 5 (82/06/01 5615)  Complications: No notable events documented.

## 2021-06-09 NOTE — Discharge Instructions (Addendum)
INSTRUCTIONS AFTER JOINT REPLACEMENT  ° °Remove items at home which could result in a fall. This includes throw rugs or furniture in walking pathways °ICE to the affected joint every three hours while awake for 30 minutes at a time, for at least the first 3-5 days, and then as needed for pain and swelling.  Continue to use ice for pain and swelling. You may notice swelling that will progress down to the foot and ankle.  This is normal after surgery.  Elevate your leg when you are not up walking on it.   °Continue to use the breathing machine you got in the hospital (incentive spirometer) which will help keep your temperature down.  It is common for your temperature to cycle up and down following surgery, especially at night when you are not up moving around and exerting yourself.  The breathing machine keeps your lungs expanded and your temperature down. ° ° °DIET:  As you were doing prior to hospitalization, we recommend a well-balanced diet. ° °DRESSING / WOUND CARE / SHOWERING ° °Keep the surgical dressing until follow up.  The dressing is water proof, so you can shower without any extra covering.  IF THE DRESSING FALLS OFF or the wound gets wet inside, change the dressing with sterile gauze.  Please use good hand washing techniques before changing the dressing.  Do not use any lotions or creams on the incision until instructed by your surgeon.   ° °ACTIVITY ° °Increase activity slowly as tolerated, but follow the weight bearing instructions below.   °No driving for 6 weeks or until further direction given by your physician.  You cannot drive while taking narcotics.  °No lifting or carrying greater than 10 lbs. until further directed by your surgeon. °Avoid periods of inactivity such as sitting longer than an hour when not asleep. This helps prevent blood clots.  °You may return to work once you are authorized by your doctor.  ° ° ° °WEIGHT BEARING  ° °Weight bearing as tolerated with assist device (walker, cane,  etc) as directed, use it as long as suggested by your surgeon or therapist, typically at least 4-6 weeks. ° ° °EXERCISES ° °Results after joint replacement surgery are often greatly improved when you follow the exercise, range of motion and muscle strengthening exercises prescribed by your doctor. Safety measures are also important to protect the joint from further injury. Any time any of these exercises cause you to have increased pain or swelling, decrease what you are doing until you are comfortable again and then slowly increase them. If you have problems or questions, call your caregiver or physical therapist for advice.  ° °Rehabilitation is important following a joint replacement. After just a few days of immobilization, the muscles of the leg can become weakened and shrink (atrophy).  These exercises are designed to build up the tone and strength of the thigh and leg muscles and to improve motion. Often times heat used for twenty to thirty minutes before working out will loosen up your tissues and help with improving the range of motion but do not use heat for the first two weeks following surgery (sometimes heat can increase post-operative swelling).  ° °These exercises can be done on a training (exercise) mat, on the floor, on a table or on a bed. Use whatever works the best and is most comfortable for you.    Use music or television while you are exercising so that the exercises are a pleasant break in your   day. This will make your life better with the exercises acting as a break in your routine that you can look forward to.   Perform all exercises about fifteen times, three times per day or as directed.  You should exercise both the operative leg and the other leg as well.  Exercises include:   Quad Sets - Tighten up the muscle on the front of the thigh (Quad) and hold for 5-10 seconds.   Straight Leg Raises - With your knee straight (if you were given a brace, keep it on), lift the leg to 60  degrees, hold for 3 seconds, and slowly lower the leg.  Perform this exercise against resistance later as your leg gets stronger.  Leg Slides: Lying on your back, slowly slide your foot toward your buttocks, bending your knee up off the floor (only go as far as is comfortable). Then slowly slide your foot back down until your leg is flat on the floor again.  Angel Wings: Lying on your back spread your legs to the side as far apart as you can without causing discomfort.  Hamstring Strength:  Lying on your back, push your heel against the floor with your leg straight by tightening up the muscles of your buttocks.  Repeat, but this time bend your knee to a comfortable angle, and push your heel against the floor.  You may put a pillow under the heel to make it more comfortable if necessary.   A rehabilitation program following joint replacement surgery can speed recovery and prevent re-injury in the future due to weakened muscles. Contact your doctor or a physical therapist for more information on knee rehabilitation.    CONSTIPATION  Constipation is defined medically as fewer than three stools per week and severe constipation as less than one stool per week.  Even if you have a regular bowel pattern at home, your normal regimen is likely to be disrupted due to multiple reasons following surgery.  Combination of anesthesia, postoperative narcotics, change in appetite and fluid intake all can affect your bowels.   YOU MUST use at least one of the following options; they are listed in order of increasing strength to get the job done.  They are all available over the counter, and you may need to use some, POSSIBLY even all of these options:    Drink plenty of fluids (prune juice may be helpful) and high fiber foods Colace 100 mg by mouth twice a day  Senokot for constipation as directed and as needed Dulcolax (bisacodyl), take with full glass of water  Miralax (polyethylene glycol) once or twice a day as  needed.  If you have tried all these things and are unable to have a bowel movement in the first 3-4 days after surgery call either your surgeon or your primary doctor.    If you experience loose stools or diarrhea, hold the medications until you stool forms back up.  If your symptoms do not get better within 1 week or if they get worse, check with your doctor.  If you experience "the worst abdominal pain ever" or develop nausea or vomiting, please contact the office immediately for further recommendations for treatment.   ITCHING:  If you experience itching with your medications, try taking only a single pain pill, or even half a pain pill at a time.  You can also use Benadryl over the counter for itching or also to help with sleep.   TED HOSE STOCKINGS:  Use stockings on both  legs until for at least 2 weeks or as directed by physician office. They may be removed at night for sleeping.  MEDICATIONS:  See your medication summary on the After Visit Summary that nursing will review with you.  You may have some home medications which will be placed on hold until you complete the course of blood thinner medication.  It is important for you to complete the blood thinner medication as prescribed.  Resume Coumadin/Lovenox bridge as prescribed and directed by PCP.  PRECAUTIONS:  If you experience chest pain or shortness of breath - call 911 immediately for transfer to the hospital emergency department.   If you develop a fever greater that 101 F, purulent drainage from wound, increased redness or drainage from wound, foul odor from the wound/dressing, or calf pain - CONTACT YOUR SURGEON.                                                   FOLLOW-UP APPOINTMENTS:  If you do not already have a post-op appointment, please call the office for an appointment to be seen by your surgeon.  Guidelines for how soon to be seen are listed in your After Visit Summary, but are typically between 1-4 weeks after  surgery.  OTHER INSTRUCTIONS:   Knee Replacement:  Do not place pillow under knee, focus on keeping the knee straight while resting. CPM instructions: 0-90 degrees, 2 hours in the morning, 2 hours in the afternoon, and 2 hours in the evening. Place foam block, curve side up under heel at all times except when in CPM or when walking.  DO NOT modify, tear, cut, or change the foam block in any way.  POST-OPERATIVE OPIOID TAPER INSTRUCTIONS: It is important to wean off of your opioid medication as soon as possible. If you do not need pain medication after your surgery it is ok to stop day one. Opioids include: Codeine, Hydrocodone(Norco, Vicodin), Oxycodone(Percocet, oxycontin) and hydromorphone amongst others.  Long term and even short term use of opiods can cause: Increased pain response Dependence Constipation Depression Respiratory depression And more.  Withdrawal symptoms can include Flu like symptoms Nausea, vomiting And more Techniques to manage these symptoms Hydrate well Eat regular healthy meals Stay active Use relaxation techniques(deep breathing, meditating, yoga) Do Not substitute Alcohol to help with tapering If you have been on opioids for less than two weeks and do not have pain than it is ok to stop all together.  Plan to wean off of opioids This plan should start within one week post op of your joint replacement. Maintain the same interval or time between taking each dose and first decrease the dose.  Cut the total daily intake of opioids by one tablet each day Next start to increase the time between doses. The last dose that should be eliminated is the evening dose.   MAKE SURE YOU:  Understand these instructions.  Get help right away if you are not doing well or get worse.    Thank you for letting us be a part of your medical care team.  It is a privilege we respect greatly.  We hope these instructions will help you stay on track for a fast and full recovery!

## 2021-06-09 NOTE — Anesthesia Procedure Notes (Signed)
Procedure Name: Intubation Date/Time: 06/09/2021 2:17 PM Performed by: Lollie Sails, CRNA Pre-anesthesia Checklist: Patient identified, Emergency Drugs available, Suction available, Patient being monitored and Timeout performed Patient Re-evaluated:Patient Re-evaluated prior to induction Oxygen Delivery Method: Circle system utilized Preoxygenation: Pre-oxygenation with 100% oxygen Induction Type: IV induction Ventilation: Two handed mask ventilation required Laryngoscope Size: Miller and 3 Grade View: Grade I Tube type: Oral Tube size: 8.0 mm Number of attempts: 1 Airway Equipment and Method: Stylet Placement Confirmation: ETT inserted through vocal cords under direct vision, positive ETCO2 and breath sounds checked- equal and bilateral Secured at: 24 cm Tube secured with: Tape Dental Injury: Teeth and Oropharynx as per pre-operative assessment

## 2021-06-09 NOTE — Plan of Care (Signed)
  Problem: Education: Goal: Knowledge of General Education information will improve Description Including pain rating scale, medication(s)/side effects and non-pharmacologic comfort measures Outcome: Progressing   Problem: Nutrition: Goal: Adequate nutrition will be maintained Outcome: Progressing   Problem: Pain Managment: Goal: General experience of comfort will improve Outcome: Progressing   

## 2021-06-10 DIAGNOSIS — Z86711 Personal history of pulmonary embolism: Secondary | ICD-10-CM | POA: Diagnosis not present

## 2021-06-10 DIAGNOSIS — I2510 Atherosclerotic heart disease of native coronary artery without angina pectoris: Secondary | ICD-10-CM | POA: Diagnosis not present

## 2021-06-10 DIAGNOSIS — I110 Hypertensive heart disease with heart failure: Secondary | ICD-10-CM | POA: Diagnosis not present

## 2021-06-10 DIAGNOSIS — M1612 Unilateral primary osteoarthritis, left hip: Secondary | ICD-10-CM | POA: Diagnosis not present

## 2021-06-10 DIAGNOSIS — Z79899 Other long term (current) drug therapy: Secondary | ICD-10-CM | POA: Diagnosis not present

## 2021-06-10 DIAGNOSIS — I509 Heart failure, unspecified: Secondary | ICD-10-CM | POA: Diagnosis not present

## 2021-06-10 DIAGNOSIS — E119 Type 2 diabetes mellitus without complications: Secondary | ICD-10-CM | POA: Diagnosis not present

## 2021-06-10 DIAGNOSIS — Z87891 Personal history of nicotine dependence: Secondary | ICD-10-CM | POA: Diagnosis not present

## 2021-06-10 DIAGNOSIS — Z7901 Long term (current) use of anticoagulants: Secondary | ICD-10-CM | POA: Diagnosis not present

## 2021-06-10 LAB — CBC
HCT: 37.2 % — ABNORMAL LOW (ref 39.0–52.0)
Hemoglobin: 12.4 g/dL — ABNORMAL LOW (ref 13.0–17.0)
MCH: 30.5 pg (ref 26.0–34.0)
MCHC: 33.3 g/dL (ref 30.0–36.0)
MCV: 91.6 fL (ref 80.0–100.0)
Platelets: 118 10*3/uL — ABNORMAL LOW (ref 150–400)
RBC: 4.06 MIL/uL — ABNORMAL LOW (ref 4.22–5.81)
RDW: 13.9 % (ref 11.5–15.5)
WBC: 10.5 10*3/uL (ref 4.0–10.5)
nRBC: 0 % (ref 0.0–0.2)

## 2021-06-10 LAB — PROTIME-INR
INR: 1.1 (ref 0.8–1.2)
Prothrombin Time: 14.1 seconds (ref 11.4–15.2)

## 2021-06-10 MED ORDER — ENOXAPARIN SODIUM 30 MG/0.3ML IJ SOSY: 30.0000 mg | PREFILLED_SYRINGE | Freq: Two times a day (BID) | INTRAMUSCULAR | Status: DC

## 2021-06-10 MED ORDER — ENOXAPARIN SODIUM 40 MG/0.4ML IJ SOSY
40.0000 mg | PREFILLED_SYRINGE | Freq: Two times a day (BID) | INTRAMUSCULAR | Status: DC
  Administered 2021-06-10 – 2021-06-12 (×5): 40 mg via SUBCUTANEOUS
  Filled 2021-06-10 (×5): qty 0.4

## 2021-06-10 NOTE — Evaluation (Signed)
Physical Therapy Evaluation Patient Details Name: Jeff Green MRN: [id number] DOB: 07-Jan-1956 Today's Date: 06/10/2021  History of Present Illness  66 yo male s/p L DA THA PMH: covid, shingles, afib, PE, HTN, IVC fliter, laminectomy  Clinical Impression  Pt is s/p THA resulting in the deficits listed below (see PT Problem List).  Pt mobility limited by pain, able to transfer to chair only. Pt has 2 flights of stairs to enter his home, do not feel he will be ready to d/c today--will see again this pm  Pt will benefit from skilled PT to increase their independence and safety with mobility to allow discharge to the venue listed below.         Recommendations for follow up therapy are one component of a multi-disciplinary discharge planning process, led by the attending physician.  Recommendations may be updated based on patient status, additional functional criteria and insurance authorization.  Follow Up Recommendations Follow physician's recommendations for discharge plan and follow up therapies    Assistance Recommended at Discharge Intermittent Supervision/Assistance  Patient can return home with the following  A little help with bathing/dressing/bathroom;Assist for transportation;Help with stairs or ramp for entrance;Assistance with cooking/housework    Equipment Recommendations Rolling walker (2 wheels)  Recommendations for Other Services       Functional Status Assessment Patient has had a recent decline in their functional status and demonstrates the ability to make significant improvements in function in a reasonable and predictable amount of time.     Precautions / Restrictions Precautions Precautions: Fall Restrictions Weight Bearing Restrictions: No Other Position/Activity Restrictions: WBAT      Mobility  Bed Mobility Overal bed mobility: Needs Assistance Bed Mobility: Supine to Sit     Supine to sit: Min assist, HOB elevated     General bed mobility  comments: heavy use of rail, incr time, assist with LLE    Transfers Overall transfer level: Needs assistance Equipment used: Rolling walker (2 wheels) Transfers: Sit to/from Stand, Bed to chair/wheelchair/BSC Sit to Stand: Min assist, From elevated surface   Step pivot transfers: Min assist       General transfer comment: cues for hand placement and RLE management. light assist to steady with pivot, cues for sequence    Ambulation/Gait               General Gait Details: unable d/t pain  Stairs            Wheelchair Mobility    Modified Rankin (Stroke Patients Only)       Balance                                             Pertinent Vitals/Pain Pain Assessment Pain Assessment: 0-10 Pain Score: 8  Pain Location: L hip and thigh Pain Intervention(s): Limited activity within patient's tolerance, Monitored during session, Premedicated before session, Repositioned, Patient requesting pain meds-RN notified    Home Living Family/patient expects to be discharged to:: Private residence Living Arrangements: Non-relatives/Friends (friend/nbor) Available Help at Discharge: Family;Available 24 hours/day (2 days)   Home Access: Stairs to enter   Entrance Stairs-Number of Steps: 2 flights   Home Layout: One level Home Equipment: None Additional Comments: pt has nbor/friend staying with him until Sunday, Monday she will be close by but at her apt. nbor assists with dog walking    Prior Function Prior Level of  Function : Independent/Modified Independent                     Hand Dominance        Extremity/Trunk Assessment   Upper Extremity Assessment Upper Extremity Assessment: Overall WFL for tasks assessed    Lower Extremity Assessment Lower Extremity Assessment: LLE deficits/detail LLE Deficits / Details: ankle WFL, AAROM knee and hip flexion to ~ 80/80 LLE: Unable to fully assess due to pain       Communication    Communication: No difficulties  Cognition Arousal/Alertness: Awake/alert Behavior During Therapy: WFL for tasks assessed/performed Overall Cognitive Status: Within Functional Limits for tasks assessed                                          General Comments      Exercises Total Joint Exercises Ankle Circles/Pumps: AROM, Both, 10 reps   Assessment/Plan    PT Assessment Patient needs continued PT services  PT Problem List Decreased strength;Decreased range of motion;Decreased activity tolerance;Pain;Decreased knowledge of use of DME;Decreased mobility       PT Treatment Interventions DME instruction;Therapeutic exercise;Gait training;Functional mobility training;Therapeutic activities;Patient/family education;Stair training    PT Goals (Current goals can be found in the Care Plan section)  Acute Rehab PT Goals Patient Stated Goal: home soon PT Goal Formulation: With patient Time For Goal Achievement: 06/17/21    Frequency 7X/week     Co-evaluation               AM-PAC PT "6 Clicks" Mobility  Outcome Measure Help needed turning from your back to your side while in a flat bed without using bedrails?: A Little Help needed moving from lying on your back to sitting on the side of a flat bed without using bedrails?: A Little Help needed moving to and from a bed to a chair (including a wheelchair)?: A Little Help needed standing up from a chair using your arms (e.g., wheelchair or bedside chair)?: A Little Help needed to walk in hospital room?: A Lot Help needed climbing 3-5 steps with a railing? : Total 6 Click Score: 15    End of Session Equipment Utilized During Treatment: Gait belt Activity Tolerance: Patient limited by pain Patient left: in chair;with call bell/phone within reach;with chair alarm set Nurse Communication: Mobility status PT Visit Diagnosis: Other abnormalities of gait and mobility (R26.89);Difficulty in walking, not elsewhere  classified (R26.2);Pain Pain - Right/Left: Left Pain - part of body: Hip    Time: 0945-1010 PT Time Calculation (min) (ACUTE ONLY): 25 min   Charges:   PT Evaluation $PT Eval Low Complexity: 1 Low PT Treatments $Therapeutic Activity: 8-22 mins        Baxter Flattery, PT  Acute Rehab Dept ([facility]) [phone] Pager [phone]  06/10/2021   [facility] 06/10/2021, 11:30 AM

## 2021-06-10 NOTE — Progress Notes (Signed)
PATIENT ID: Jadden Yim  MRN: [id number]  DOB/AGE:  1956/04/05 / 66 y.o.  1 Day Post-Op Procedure(s) (LRB): TOTAL HIP ARTHROPLASTY ANTERIOR APPROACH (Left)    PROGRESS NOTE Subjective: Patient is alert, oriented, no Nausea, no Vomiting, yes passing gas, . Taking PO well. Denies SOB, Chest or Calf Pain. Using Incentive Spirometer, PAS in place. Ambulate WBAT with walker Patient reports pain as  8-9/10  .    Objective: Vital signs in last 24 hours: Vitals:   06/09/21 2000 06/10/21 0242 06/10/21 0500 06/10/21 0802  BP: 99/75 123/80 118/75 116/75  Pulse: 64 80 84   Resp: 18 18 16    Temp: 97.8 F (36.6 C) 98.5 F (36.9 C) 98.8 F (37.1 C)   TempSrc: Oral Oral Oral   SpO2: 98% 97% 98%   Weight:      Height:          Intake/Output from previous day: I/O last 3 completed shifts: In: 3956.3 [P.O.:600; I.V.:2956.3; IV Piggyback:400] Out: 2050 [Urine:1400; Blood:650]   Intake/Output this shift: Total I/O In: -  Out: 250 [Urine:250]   LABORATORY DATA: Recent Labs    06/09/21 0751 06/09/21 0812 06/10/21 0400  WBC  --   --  10.5  HGB  --   --  12.4*  HCT  --   --  37.2*  PLT  --   --  118*  GLUCAP 188*  --   --   INR  --  1.0 1.1    Examination: Neurologically intact Neurovascular intact Sensation intact distally Intact pulses distally Dorsiflexion/Plantar flexion intact Incision: dressing C/D/I and no drainage No cellulitis present Compartment soft} XR AP&Lat of hip shows well placed\fixed THAAFIB  Assessment:   1 Day Post-Op Procedure(s) (LRB): TOTAL HIP ARTHROPLASTY ANTERIOR APPROACH (Left) ADDITIONAL DIAGNOSIS:  Expected Acute Blood Loss Anemia, Cardiac Arrythmia afib  Patient's anticipated LOS is less than 2 midnights, meeting these requirements: - Younger than 50 - Lives within 1 hour of care - Has a competent adult at home to recover with post-op recover - NO history of  - Chronic pain requiring opiods  - Diabetes  - Coronary Artery Disease  -  Heart failure  - Heart attack  - Stroke  - DVT/VTE  - Cardiac arrhythmia  - Respiratory Failure/COPD  - Renal failure  - Anemia  - Advanced Liver disease     Plan: PT/OT WBAT, THA  DVT Prophylaxis: SCDx72 hrs, coumadin with lovenox bridge  DISCHARGE PLAN: Home  DISCHARGE NEEDS: HHPT, Walker, and 3-in-1 comode seat

## 2021-06-10 NOTE — Plan of Care (Signed)
°  Problem: Education: Goal: Knowledge of General Education information will improve Description: Including pain rating scale, medication(s)/side effects and non-pharmacologic comfort measures 06/10/2021 0728 by Olen Cordial, RN Outcome: Progressing 06/09/2021 1804 by Olen Cordial, RN Outcome: Progressing   Problem: Activity: Goal: Risk for activity intolerance will decrease 06/10/2021 0728 by Olen Cordial, RN Outcome: Progressing 06/09/2021 1804 by Olen Cordial, RN Outcome: Progressing   Problem: Pain Managment: Goal: General experience of comfort will improve 06/10/2021 0728 by Olen Cordial, RN Outcome: Progressing 06/09/2021 1804 by Olen Cordial, RN Outcome: Progressing

## 2021-06-10 NOTE — Progress Notes (Signed)
Physical Therapy Treatment Patient Details Name: Jeff Green MRN: [id number] DOB: Jan 13, 1956 Today's Date: 06/10/2021   History of Present Illness 66 yo male s/p L DA THA PMH: covid, shingles, afib, PE, HTN, IVC fliter, laminectomy    PT Comments    Pt progressing somewhat slowly, limited by pain. Able to amb short distance into hallway this afternoon  Recommendations for follow up therapy are one component of a multi-disciplinary discharge planning process, led by the attending physician.  Recommendations may be updated based on patient status, additional functional criteria and insurance authorization.  Follow Up Recommendations  Follow physician's recommendations for discharge plan and follow up therapies     Assistance Recommended at Discharge Intermittent Supervision/Assistance  Patient can return home with the following A little help with bathing/dressing/bathroom;Assist for transportation;Help with stairs or ramp for entrance;Assistance with cooking/housework   Equipment Recommendations  Rolling walker (2 wheels)    Recommendations for Other Services       Precautions / Restrictions Precautions Precautions: Fall Restrictions Weight Bearing Restrictions: No Other Position/Activity Restrictions: WBAT     Mobility  Bed Mobility Overal bed mobility: Needs Assistance Bed Mobility: Sit to Supine     Supine to sit: Min assist, HOB elevated Sit to supine: Min assist   General bed mobility comments: incr time, cues for technique, assist with LLE    Transfers Overall transfer level: Needs assistance Equipment used: Rolling walker (2 wheels) Transfers: Sit to/from Stand Sit to Stand: Min assist, From elevated surface   Step pivot transfers: Min assist       General transfer comment: cues for hand placement and RLE management. assist with anterior -superior wt shift and transition to RW    Ambulation/Gait Ambulation/Gait assistance: Min assist Gait  Distance (Feet): 30 Feet Assistive device: Rolling walker (2 wheels) Gait Pattern/deviations: Step-to pattern, Step-through pattern, Decreased stance time - left       General Gait Details: cues for sequence and RW position from self   Stairs             Wheelchair Mobility    Modified Rankin (Stroke Patients Only)       Balance                                            Cognition Arousal/Alertness: Awake/alert Behavior During Therapy: WFL for tasks assessed/performed Overall Cognitive Status: Within Functional Limits for tasks assessed                                          Exercises Total Joint Exercises Ankle Circles/Pumps: AROM, Both, 10 reps Heel Slides: AAROM, Left, 10 reps    General Comments        Pertinent Vitals/Pain Pain Assessment Pain Assessment: 0-10 Pain Score: 7  Pain Location: L hip and thigh Pain Descriptors / Indicators: Constant, Discomfort, Grimacing, Guarding, Sore Pain Intervention(s): Limited activity within patient's tolerance, Monitored during session, Premedicated before session, Repositioned (declined ice)    Home Living                          Prior Function            PT Goals (current goals can now be found in the care plan section) Acute Rehab  PT Goals Patient Stated Goal: home soon PT Goal Formulation: With patient Time For Goal Achievement: 06/17/21 Progress towards PT goals: Progressing toward goals    Frequency    7X/week      PT Plan Current plan remains appropriate    Co-evaluation              AM-PAC PT "6 Clicks" Mobility   Outcome Measure  Help needed turning from your back to your side while in a flat bed without using bedrails?: A Little Help needed moving from lying on your back to sitting on the side of a flat bed without using bedrails?: A Little Help needed moving to and from a bed to a chair (including a wheelchair)?: A Little Help  needed standing up from a chair using your arms (e.g., wheelchair or bedside chair)?: A Little Help needed to walk in hospital room?: A Little Help needed climbing 3-5 steps with a railing? : Total 6 Click Score: 16    End of Session Equipment Utilized During Treatment: Gait belt Activity Tolerance: Patient tolerated treatment well;Patient limited by pain Patient left: in bed;with call bell/phone within reach;with bed alarm set;with family/visitor present Nurse Communication: Mobility status PT Visit Diagnosis: Other abnormalities of gait and mobility (R26.89);Difficulty in walking, not elsewhere classified (R26.2);Pain Pain - Right/Left: Left Pain - part of body: Hip     Time: 6568-1275 PT Time Calculation (min) (ACUTE ONLY): 25 min  Charges:  $Gait Training: 8-22 mins $Therapeutic Activity: 8-22 mins                     Baxter Flattery, PT  Acute Rehab Dept ([place]) [phone] Pager [phone]  06/10/2021    [facility] 06/10/2021, 4:45 PM

## 2021-06-10 NOTE — TOC Transition Note (Signed)
Transition of Care [facility]) - CM/SW Discharge Note  Patient Details  Name: Jeff Green MRN: [id number] Date of Birth: 20-Oct-1955  [facility] CM/SW Contact:  Sherie Don, LCSW Phone Number: 06/10/2021, 10:16 AM  Clinical Narrative: Patient is expected to discharge home after working with PT. CSW spoke with patient to confirm discharge plan and needs. Patient has been prearranged with HHPT through [facility]. Patient reported [employer] delivered his rolling walker to his room yesterday and he has a toilet riser at home, so there are no DME needs at this time. TOC signing off.  Final next level of care: [place] Barriers to Discharge: No Barriers Identified  Patient Goals and CMS Choice Patient states their goals for this hospitalization and ongoing recovery are:: Discharge home with [place] CMS Medicare.gov Compare Post Acute Care list provided to:: Patient Choice offered to / list presented to : Patient  Discharge Plan and Services        DME Arranged: Walker rolling DME Agency: [employer] Representative spoke with at DME Agency: Prearranged in orthopedist's office HH Arranged: PT [place] Agency: Magazine features editor spoke with at [place]: Prearranged in orthopedist's office  Readmission Risk Interventions No flowsheet data found.

## 2021-06-10 NOTE — Discharge Summary (Signed)
Patient ID: Jeff Green MRN: [id number] DOB/AGE: 64/29/1957 64 y.o.  Admit date: 06/09/2021 Discharge date: 06/10/2021  Admission Diagnoses:  Principal Problem:   Primary osteoarthritis of left hip   Discharge Diagnoses:  Same  Past Medical History:  Diagnosis Date   -+    Stent placed; [place]   Arthritis    Atrial fibrillation ([place])    Cancer [facility])    testicular cancer   Cataract, left eye    Congestive heart failure (CHF) (HCC)    DVT (deep venous thrombosis) (HCC)    High cholesterol    Pneumonia    Psychosis ([place])    Pulmonary embolus ([place])     Surgeries: Procedure(s): TOTAL HIP ARTHROPLASTY ANTERIOR APPROACH on 06/09/2021   Consultants:   Discharged Condition: Improved  Hospital Course: Jeff Green is an 66 y.o. male who was admitted 06/09/2021 for operative treatment ofPrimary osteoarthritis of left hip. Patient has severe unremitting pain that affects sleep, daily activities, and work/hobbies. After pre-op clearance the patient was taken to the operating room on 06/09/2021 and underwent  Procedure(s): TOTAL HIP ARTHROPLASTY ANTERIOR APPROACH.    Patient was given perioperative antibiotics:  Anti-infectives (From admission, onward)    Start     Dose/Rate Route Frequency Ordered Stop   06/09/21 2000  ceFAZolin (ANCEF) IVPB 2g/100 mL premix        2 g 200 mL/hr over 30 Minutes Intravenous Every 6 hours 06/09/21 1735 06/10/21 0134   06/09/21 0745  ceFAZolin (ANCEF) IVPB 3g/100 mL premix        3 g 200 mL/hr over 30 Minutes Intravenous On call to O.R. 06/09/21 0732 06/09/21 1452        Patient was given sequential compression devices, early ambulation, and chemoprophylaxis to prevent DVT.  Patient benefited maximally from hospital stay and there were no complications.    Recent vital signs: Patient Vitals for the past 24 hrs:  BP Temp Temp src Pulse Resp SpO2  06/10/21 0802 116/75 -- -- -- -- --  06/10/21 0500 118/75 98.8 F (37.1  C) Oral 84 16 98 %  06/10/21 0242 123/80 98.5 F (36.9 C) Oral 80 18 97 %  06/09/21 2000 99/75 97.8 F (36.6 C) Oral 64 18 98 %  06/09/21 1800 130/88 97.7 F (36.5 C) Oral (!) 59 17 100 %  06/09/21 1745 (!) 121/92 97.6 F (36.4 C) -- 60 12 100 %  06/09/21 1730 126/89 -- -- (!) 57 16 100 %  06/09/21 1715 116/79 -- -- 60 12 100 %  06/09/21 1700 129/85 -- -- (!) 57 10 99 %  06/09/21 1645 116/78 -- -- 63 11 100 %  06/09/21 1630 127/83 (!) 97.1 F (36.2 C) -- 64 16 100 %     Recent laboratory studies:  Recent Labs    06/09/21 0812 06/10/21 0400  WBC  --  10.5  HGB  --  12.4*  HCT  --  37.2*  PLT  --  118*  INR 1.0 1.1     Discharge Medications:   Allergies as of 06/10/2021       Reactions   Lipitor [atorvastatin]    myalgias        Medication List     STOP taking these medications    cyclobenzaprine 10 MG tablet Commonly known as: FLEXERIL   enoxaparin 40 MG/0.4ML injection Commonly known as: LOVENOX   Vitamin K (Phytonadione) 100 MCG Tabs       TAKE these medications  amitriptyline 50 MG tablet Commonly known as: ELAVIL TAKE ONE-HALF TABLET BY MOUTH AT BEDTIME FOR A WEEK, THEN 1 TABLET AT BEDITME   Biotin 5000 MCG Caps Take 5,000 mcg by mouth daily.   carvedilol 25 MG tablet Commonly known as: COREG TAKE ONE TABLET BY MOUTH TWICE A DAY WITH A MEAL   COQ-10 PO Take 200 mg by mouth daily.   diltiazem 120 MG 24 hr capsule Commonly known as: TIAZAC TAKE ONE CAPSULE BY MOUTH DAILY   diltiazem 120 MG 24 hr capsule Commonly known as: CARDIZEM CD TAKE ONE CAPSULE BY MOUTH DAILY   docusate sodium 100 MG capsule Commonly known as: Colace Take 1 capsule (100 mg total) by mouth 2 (two) times daily.   fenofibrate 160 MG tablet TAKE ONE TABLET BY MOUTH DAILY   GLUCOSAMINE CHONDROITIN COMPLX PO Take 2 tablets by mouth daily.   multivitamin capsule Take 1 capsule by mouth daily.   NEEDLE (DISP) 18 G 18G X 1-1/2" Misc Use as needed for  testosterone injections   oxyCODONE-acetaminophen 5-325 MG tablet Commonly known as: PERCOCET/ROXICET Take 1-2 tablets by mouth every 6 (six) hours as needed for severe pain.   PROBIOTIC DAILY PO Take 1 capsule by mouth.   rosuvastatin 40 MG tablet Commonly known as: CRESTOR Take 1 tablet (40 mg total) by mouth daily.   SYRINGE 3CC/22GX1" 22G X 1" 3 ML Misc Use as needed for testosterone injections   testosterone cypionate 200 MG/ML injection Commonly known as: DEPOTESTOSTERONE CYPIONATE 77mL IM every other week.   tiZANidine 2 MG tablet Commonly known as: ZANAFLEX Take 1 tablet (2 mg total) by mouth every 8 (eight) hours as needed for muscle spasms.   topiramate 100 MG tablet Commonly known as: TOPAMAX Take 1 tablet (100 mg total) by mouth 2 (two) times daily.   traMADol 50 MG tablet Commonly known as: ULTRAM TAKE ONE TO TWO TABLETS BY MOUTH EVERY 8 HOURS AS NEEDED FOR MODERATE PAIN, MAX DAILY DOSE OF 6 TABLETS PER DAY   warfarin 7.5 MG tablet Commonly known as: COUMADIN Take as directed. If you are unsure how to take this medication, talk to your nurse or doctor. Original instructions: TAKE ONE TABLET BY MOUTH DAILY What changed:  how much to take when to take this additional instructions               Durable Medical Equipment  (From admission, onward)           Start     Ordered   06/09/21 1736  DME Walker rolling  Once       Question:  Patient needs a walker to treat with the following condition  Answer:  Primary osteoarthritis of right hip   06/09/21 1735   06/09/21 1736  DME 3 n 1  Once        06/09/21 1735              Discharge Care Instructions  (From admission, onward)           Start     Ordered   06/10/21 0000  Weight bearing as tolerated        06/10/21 0833   06/10/21 0000  Weight bearing as tolerated        06/10/21 0838            Diagnostic Studies: DG Chest 2 View  Result Date: 05/29/2021 CLINICAL DATA:   Preoperative evaluation. EXAM: CHEST - 2 VIEW COMPARISON:  July 12, 2015  FINDINGS: Decreased lung volumes are seen. Mild, stable scarring and/or atelectasis is noted within the left lung base. There is no evidence of a pleural effusion or pneumothorax. The heart size and mediastinal contours are within normal limits. The visualized skeletal structures are unremarkable. IMPRESSION: Mild, stable left basilar scarring and/or atelectasis. Electronically Signed   By: Virgina Norfolk M.D.   On: 05/29/2021 19:04   DG C-Arm 1-60 Min-No Report  Result Date: 06/09/2021 Fluoroscopy was utilized by the requesting physician.  No radiographic interpretation.   DG C-Arm 1-60 Min-No Report  Result Date: 06/09/2021 Fluoroscopy was utilized by the requesting physician.  No radiographic interpretation.   DG HIP UNILAT WITH PELVIS 1V LEFT  Result Date: 06/09/2021 CLINICAL DATA:  Fluoroscopy for left hip replacement. EXAM: DG HIP (WITH OR WITHOUT PELVIS) 1V*L* COMPARISON:  Pelvis and bilateral hip radiographs 04/27/2021 FINDINGS: Images were performed intraoperatively without the presence of a radiologist. Total fluoro time: 15 seconds Dose: 2.83 mGy Total images: 3 The patient appears to be undergoing total left hip arthroplasty. Please see intraoperative findings for further detail. IMPRESSION: Fluoroscopy for total left hip arthroplasty. Electronically Signed   By: Yvonne Kendall M.D.   On: 06/09/2021 16:07    Disposition: Discharge disposition: 01-Home or Self Care       Discharge Instructions     Call MD / Call 911   Complete by: As directed    If you experience chest pain or shortness of breath, CALL 911 and be transported to the hospital emergency room.  If you develope a fever above 101 F, pus (white drainage) or increased drainage or redness at the wound, or calf pain, call your surgeon's office.   Call MD / Call 911   Complete by: As directed    If you experience chest pain or shortness of breath,  CALL 911 and be transported to the hospital emergency room.  If you develope a fever above 101 F, pus (white drainage) or increased drainage or redness at the wound, or calf pain, call your surgeon's office.   Constipation Prevention   Complete by: As directed    Drink plenty of fluids.  Prune juice may be helpful.  You may use a stool softener, such as Colace (over the counter) 100 mg twice a day.  Use MiraLax (over the counter) for constipation as needed.   Constipation Prevention   Complete by: As directed    Drink plenty of fluids.  Prune juice may be helpful.  You may use a stool softener, such as Colace (over the counter) 100 mg twice a day.  Use MiraLax (over the counter) for constipation as needed.   Diet - low sodium heart healthy   Complete by: As directed    Driving restrictions   Complete by: As directed    No driving for 2 weeks   Driving restrictions   Complete by: As directed    No driving for 2 weeks   Increase activity slowly as tolerated   Complete by: As directed    Increase activity slowly as tolerated   Complete by: As directed    Patient may shower   Complete by: As directed    You may shower without a dressing once there is no drainage.  Do not wash over the wound.  If drainage remains, cover wound with plastic wrap and then shower.   Patient may shower   Complete by: As directed    You may shower without a dressing once there is  no drainage.  Do not wash over the wound.  If drainage remains, cover wound with plastic wrap and then shower.   Post-operative opioid taper instructions:   Complete by: As directed    POST-OPERATIVE OPIOID TAPER INSTRUCTIONS: It is important to wean off of your opioid medication as soon as possible. If you do not need pain medication after your surgery it is ok to stop day one. Opioids include: Codeine, Hydrocodone(Norco, Vicodin), Oxycodone(Percocet, oxycontin) and hydromorphone amongst others.  Long term and even short term use of  opiods can cause: Increased pain response Dependence Constipation Depression Respiratory depression And more.  Withdrawal symptoms can include Flu like symptoms Nausea, vomiting And more Techniques to manage these symptoms Hydrate well Eat regular healthy meals Stay active Use relaxation techniques(deep breathing, meditating, yoga) Do Not substitute Alcohol to help with tapering If you have been on opioids for less than two weeks and do not have pain than it is ok to stop all together.  Plan to wean off of opioids This plan should start within one week post op of your joint replacement. Maintain the same interval or time between taking each dose and first decrease the dose.  Cut the total daily intake of opioids by one tablet each day Next start to increase the time between doses. The last dose that should be eliminated is the evening dose.      Post-operative opioid taper instructions:   Complete by: As directed    POST-OPERATIVE OPIOID TAPER INSTRUCTIONS: It is important to wean off of your opioid medication as soon as possible. If you do not need pain medication after your surgery it is ok to stop day one. Opioids include: Codeine, Hydrocodone(Norco, Vicodin), Oxycodone(Percocet, oxycontin) and hydromorphone amongst others.  Long term and even short term use of opiods can cause: Increased pain response Dependence Constipation Depression Respiratory depression And more.  Withdrawal symptoms can include Flu like symptoms Nausea, vomiting And more Techniques to manage these symptoms Hydrate well Eat regular healthy meals Stay active Use relaxation techniques(deep breathing, meditating, yoga) Do Not substitute Alcohol to help with tapering If you have been on opioids for less than two weeks and do not have pain than it is ok to stop all together.  Plan to wean off of opioids This plan should start within one week post op of your joint replacement. Maintain the same  interval or time between taking each dose and first decrease the dose.  Cut the total daily intake of opioids by one tablet each day Next start to increase the time between doses. The last dose that should be eliminated is the evening dose.      Weight bearing as tolerated   Complete by: As directed    Weight bearing as tolerated   Complete by: As directed         Follow-up Information     Dorna Leitz, MD. Go on 06/22/2021.   Specialty: Orthopedic Surgery Why: Your appointment has been scheduled for 11:00. Contact information: [place] [address] [phone]         [facility]- Jule Ser. Go on 06/23/2021.   Why: Your appointment has been scheduled. They will call you with the time Contact information: [place], [place] Follow up.   Specialty: Home Health Services Why: You will receive 6 HHPT visits prior to starting outpatient physical therapy Contact information: [address] [place] [address] [phone]  Signed: Joanell Rising 06/10/2021, 8:38 AM

## 2021-06-11 DIAGNOSIS — Z79899 Other long term (current) drug therapy: Secondary | ICD-10-CM | POA: Diagnosis not present

## 2021-06-11 DIAGNOSIS — Z87891 Personal history of nicotine dependence: Secondary | ICD-10-CM | POA: Diagnosis not present

## 2021-06-11 DIAGNOSIS — I110 Hypertensive heart disease with heart failure: Secondary | ICD-10-CM | POA: Diagnosis not present

## 2021-06-11 DIAGNOSIS — M1612 Unilateral primary osteoarthritis, left hip: Secondary | ICD-10-CM | POA: Diagnosis not present

## 2021-06-11 DIAGNOSIS — I509 Heart failure, unspecified: Secondary | ICD-10-CM | POA: Diagnosis not present

## 2021-06-11 DIAGNOSIS — Z86711 Personal history of pulmonary embolism: Secondary | ICD-10-CM | POA: Diagnosis not present

## 2021-06-11 DIAGNOSIS — E119 Type 2 diabetes mellitus without complications: Secondary | ICD-10-CM | POA: Diagnosis not present

## 2021-06-11 DIAGNOSIS — I2510 Atherosclerotic heart disease of native coronary artery without angina pectoris: Secondary | ICD-10-CM | POA: Diagnosis not present

## 2021-06-11 DIAGNOSIS — Z7901 Long term (current) use of anticoagulants: Secondary | ICD-10-CM | POA: Diagnosis not present

## 2021-06-11 LAB — CBC
HCT: 32.9 % — ABNORMAL LOW (ref 39.0–52.0)
Hemoglobin: 10.8 g/dL — ABNORMAL LOW (ref 13.0–17.0)
MCH: 30.3 pg (ref 26.0–34.0)
MCHC: 32.8 g/dL (ref 30.0–36.0)
MCV: 92.2 fL (ref 80.0–100.0)
Platelets: 102 10*3/uL — ABNORMAL LOW (ref 150–400)
RBC: 3.57 MIL/uL — ABNORMAL LOW (ref 4.22–5.81)
RDW: 13.9 % (ref 11.5–15.5)
WBC: 13.9 10*3/uL — ABNORMAL HIGH (ref 4.0–10.5)
nRBC: 0 % (ref 0.0–0.2)

## 2021-06-11 LAB — PROTIME-INR
INR: 1.2 (ref 0.8–1.2)
Prothrombin Time: 15.4 seconds — ABNORMAL HIGH (ref 11.4–15.2)

## 2021-06-11 NOTE — Progress Notes (Signed)
Physical Therapy Treatment Patient Details Name: Jeff Green MRN: [id number] DOB: January 16, 1956 Today's Date: 06/11/2021   History of Present Illness 66 yo male s/p L DA THA PMH: covid, shingles, afib, PE, HTN, IVC fliter, laminectomy    PT Comments    Pt is progressing, activity tolerance improving, incr gait distance needing only 2 brief standing rests. Should be ready to d/c after stair training tomorrow    Recommendations for follow up therapy are one component of a multi-disciplinary discharge planning process, led by the attending physician.  Recommendations may be updated based on patient status, additional functional criteria and insurance authorization.  Follow Up Recommendations  Follow physician's recommendations for discharge plan and follow up therapies     Assistance Recommended at Discharge Intermittent Supervision/Assistance  Patient can return home with the following A little help with bathing/dressing/bathroom;Assist for transportation;Help with stairs or ramp for entrance;Assistance with cooking/housework   Equipment Recommendations  Rolling walker (2 wheels)    Recommendations for Other Services       Precautions / Restrictions Precautions Precautions: Fall Restrictions Weight Bearing Restrictions: No Other Position/Activity Restrictions: WBAT     Mobility  Bed Mobility Overal bed mobility: Needs Assistance Bed Mobility: Supine to Sit, Sit to Supine           General bed mobility comments: incr time, cues for technique, able to self  assist with LLE with gait belt.    Transfers Overall transfer level: Needs assistance Equipment used: Rolling walker (2 wheels) Transfers: Sit to/from Stand Sit to Stand: Min guard, From elevated surface           General transfer comment: cues for hand placement and RLE management. min/guard  with anterior -superior wt shift and transition to RW    Ambulation/Gait Ambulation/Gait assistance: Min  assist Gait Distance (Feet): 90 Feet Assistive device: Rolling walker (2 wheels) Gait Pattern/deviations: Step-to pattern, Step-through pattern, Decreased stance time - left       General Gait Details: cues for sequence and RW position from self. pt overall unsteady with wt shifting although improved step length and gait speed. 2 standing brief rests   Marine scientist Rankin (Stroke Patients Only)       Balance                                            Cognition Arousal/Alertness: Awake/alert Behavior During Therapy: WFL for tasks assessed/performed Overall Cognitive Status: Within Functional Limits for tasks assessed                                          Exercises Total Joint Exercises Ankle Circles/Pumps: AROM, Both, 10 reps Heel Slides: AAROM, Left, 10 reps    General Comments        Pertinent Vitals/Pain Pain Assessment Pain Assessment: 0-10 Pain Score: 6  Pain Location: L hip and thigh Pain Descriptors / Indicators: Constant, Discomfort, Grimacing, Guarding, Sore Pain Intervention(s): Limited activity within patient's tolerance, Monitored during session, Premedicated before session, Repositioned    Home Living                          Prior Function  PT Goals (current goals can now be found in the care plan section) Acute Rehab PT Goals Patient Stated Goal: home soon PT Goal Formulation: With patient Time For Goal Achievement: 06/17/21 Progress towards PT goals: Progressing toward goals    Frequency    7X/week      PT Plan Current plan remains appropriate    Co-evaluation              AM-PAC PT "6 Clicks" Mobility   Outcome Measure  Help needed turning from your back to your side while in a flat bed without using bedrails?: A Little Help needed moving from lying on your back to sitting on the side of a flat bed without using  bedrails?: A Little Help needed moving to and from a bed to a chair (including a wheelchair)?: A Little Help needed standing up from a chair using your arms (e.g., wheelchair or bedside chair)?: A Little Help needed to walk in hospital room?: A Little Help needed climbing 3-5 steps with a railing? : A Lot 6 Click Score: 17    End of Session Equipment Utilized During Treatment: Gait belt Activity Tolerance: Patient tolerated treatment well Patient left: with call bell/phone within reach;in bed;with bed alarm set Nurse Communication: Mobility status PT Visit Diagnosis: Other abnormalities of gait and mobility (R26.89);Difficulty in walking, not elsewhere classified (R26.2);Pain Pain - Right/Left: Left Pain - part of body: Hip     Time: 7096-2836 PT Time Calculation (min) (ACUTE ONLY): 18 min  Charges:  $Gait Training: 8-22 mins                     Baxter Flattery, PT  Acute Rehab Dept ([place]) [phone] Pager [phone]  06/11/2021    [facility] 06/11/2021, 3:53 PM

## 2021-06-11 NOTE — Plan of Care (Signed)
  Problem: Education: Goal: Knowledge of General Education information will improve Description: Including pain rating scale, medication(s)/side effects and non-pharmacologic comfort measures Outcome: Progressing   Problem: Activity: Goal: Risk for activity intolerance will decrease Outcome: Progressing   Problem: Pain Managment: Goal: General experience of comfort will improve Outcome: Progressing   

## 2021-06-11 NOTE — Progress Notes (Signed)
Physical Therapy Treatment Patient Details Name: Jeff Green MRN: [id number] DOB: [birth year] Today's Date: 06/11/2021   History of Present Illness 66 yo male s/p L DA THA PMH: covid, shingles, afib, PE, HTN, IVC fliter, laminectomy    PT Comments    Pt is progressing with mobility, incr gait distance however still fatigues fairly rapidly requiring seated rest after 45' d/t pain and fatigue. Pt has 32 steps to enter his apt and may need another day to allow safe d/c home  Recommendations for follow up therapy are one component of a multi-disciplinary discharge planning process, led by the attending physician.  Recommendations may be updated based on patient status, additional functional criteria and insurance authorization.  Follow Up Recommendations  Follow physician's recommendations for discharge plan and follow up therapies     Assistance Recommended at Discharge Intermittent Supervision/Assistance  Patient can return home with the following A little help with bathing/dressing/bathroom;Assist for transportation;Help with stairs or ramp for entrance;Assistance with cooking/housework   Equipment Recommendations  Rolling walker (2 wheels)    Recommendations for Other Services       Precautions / Restrictions Precautions Precautions: Fall Restrictions Weight Bearing Restrictions: No Other Position/Activity Restrictions: WBAT     Mobility  Bed Mobility Overal bed mobility: Needs Assistance Bed Mobility: Supine to Sit           General bed mobility comments: incr time, cues for technique, assist with LLE adn trunk. pt with LOB posteriorly x1 needing min assist to recover    Transfers Overall transfer level: Needs assistance Equipment used: Rolling walker (2 wheels) Transfers: Sit to/from Stand Sit to Stand: Min assist, Min guard, From elevated surface           General transfer comment: cues for hand placement and RLE management. min/guard  with anterior  -superior wt shift and transition to RW    Ambulation/Gait Ambulation/Gait assistance: Min assist Gait Distance (Feet): 45 Feet (x2) Assistive device: Rolling walker (2 wheels) Gait Pattern/deviations: Step-to pattern, Step-through pattern, Decreased stance time - left       General Gait Details: cues for sequence and RW position from self. pt overall unsteady with wt shifting although improved step length and gait speed. pt needing seated rest after initial 19' d/t fatigue   Stairs             Wheelchair Mobility    Modified Rankin (Stroke Patients Only)       Balance                                            Cognition Arousal/Alertness: Awake/alert Behavior During Therapy: WFL for tasks assessed/performed Overall Cognitive Status: Within Functional Limits for tasks assessed                                          Exercises      General Comments        Pertinent Vitals/Pain Pain Assessment Pain Assessment: 0-10 Pain Score: 6  Pain Location: L hip and thigh Pain Descriptors / Indicators: Constant, Discomfort, Grimacing, Guarding, Sore Pain Intervention(s): Limited activity within patient's tolerance, Monitored during session, Premedicated before session, Repositioned    Home Living  Prior Function            PT Goals (current goals can now be found in the care plan section) Acute Rehab PT Goals Patient Stated Goal: home soon PT Goal Formulation: With patient Time For Goal Achievement: 06/17/21 Progress towards PT goals: Progressing toward goals    Frequency    7X/week      PT Plan Current plan remains appropriate    Co-evaluation              AM-PAC PT "6 Clicks" Mobility   Outcome Measure  Help needed turning from your back to your side while in a flat bed without using bedrails?: A Little Help needed moving from lying on your back to sitting on the side  of a flat bed without using bedrails?: A Little Help needed moving to and from a bed to a chair (including a wheelchair)?: A Little Help needed standing up from a chair using your arms (e.g., wheelchair or bedside chair)?: A Little Help needed to walk in hospital room?: A Little Help needed climbing 3-5 steps with a railing? : A Lot 6 Click Score: 17    End of Session Equipment Utilized During Treatment: Gait belt Activity Tolerance: Patient tolerated treatment well Patient left: in chair;with call bell/phone within reach;with chair alarm set Nurse Communication: Mobility status PT Visit Diagnosis: Other abnormalities of gait and mobility (R26.89);Difficulty in walking, not elsewhere classified (R26.2);Pain Pain - Right/Left: Left Pain - part of body: Hip     Time: 1517-6160 PT Time Calculation (min) (ACUTE ONLY): 31 min  Charges:  $Gait Training: 23-37 mins                     Baxter Flattery, PT  Acute Rehab Dept ([facility]) [phone] Pager [phone]  06/11/2021    [facility] 06/11/2021, 11:32 AM

## 2021-06-11 NOTE — Progress Notes (Signed)
Orthopaedics Daily Progress Note   06/11/2021   8:20 AM  Wilberto Console is a 66 y.o. male 2 Days Post-Op s/p TOTAL HIP ARTHROPLASTY ANTERIOR APPROACH  Subjective Still having pain and difficulty mobilizing.  Denies nausea, vomiting, or fevers.  States he lives in an apartment with multiple stairs in order to get in to his front door, and at this point has not been able to mobilize to the extent that he would be able to get in.  Objective Vitals:   06/10/21 2111 06/11/21 0629  BP: (!) 141/84 120/71  Pulse: 76 63  Resp: 18 18  Temp: 97.8 F (36.6 C) 97.8 F (36.6 C)  SpO2: 97% 99%    Intake/Output Summary (Last 24 hours) at 06/11/2021 0820 Last data filed at 06/11/2021 1610 Gross per 24 hour  Intake 1659.38 ml  Output 1900 ml  Net -240.62 ml    Physical Exam LLE: Dressing clean, dry, and intact +DF/PF/EHL SILT SP/DP/T WWP distally  Assessment 66 y.o. male s/p Procedure(s) (LRB): TOTAL HIP ARTHROPLASTY ANTERIOR APPROACH (Left)  Plan Mobility: Out of bed with PT/OT Pain control: Continue to wean/titrate to appropriate oral regimen DVT Prophylaxis: SCDs for 72 hours postoperatively, Coumadin with Lovenox bridge LLE: Weightbearing as tolerated Disposition: Home with home PT   Georgeanna Harrison M.D. Orthopaedic Surgery [facility]

## 2021-06-12 ENCOUNTER — Encounter (HOSPITAL_COMMUNITY): Payer: Self-pay | Admitting: Orthopedic Surgery

## 2021-06-12 DIAGNOSIS — Z7901 Long term (current) use of anticoagulants: Secondary | ICD-10-CM | POA: Diagnosis not present

## 2021-06-12 DIAGNOSIS — I110 Hypertensive heart disease with heart failure: Secondary | ICD-10-CM | POA: Diagnosis not present

## 2021-06-12 DIAGNOSIS — Z86711 Personal history of pulmonary embolism: Secondary | ICD-10-CM | POA: Diagnosis not present

## 2021-06-12 DIAGNOSIS — I2510 Atherosclerotic heart disease of native coronary artery without angina pectoris: Secondary | ICD-10-CM | POA: Diagnosis not present

## 2021-06-12 DIAGNOSIS — Z79899 Other long term (current) drug therapy: Secondary | ICD-10-CM | POA: Diagnosis not present

## 2021-06-12 DIAGNOSIS — Z96642 Presence of left artificial hip joint: Secondary | ICD-10-CM | POA: Diagnosis not present

## 2021-06-12 DIAGNOSIS — Z87891 Personal history of nicotine dependence: Secondary | ICD-10-CM | POA: Diagnosis not present

## 2021-06-12 DIAGNOSIS — E119 Type 2 diabetes mellitus without complications: Secondary | ICD-10-CM | POA: Diagnosis not present

## 2021-06-12 DIAGNOSIS — I509 Heart failure, unspecified: Secondary | ICD-10-CM | POA: Diagnosis not present

## 2021-06-12 DIAGNOSIS — M1612 Unilateral primary osteoarthritis, left hip: Secondary | ICD-10-CM | POA: Diagnosis not present

## 2021-06-12 LAB — PROTIME-INR
INR: 1.3 — ABNORMAL HIGH (ref 0.8–1.2)
Prothrombin Time: 15.7 seconds — ABNORMAL HIGH (ref 11.4–15.2)

## 2021-06-12 LAB — CBC
HCT: 30.5 % — ABNORMAL LOW (ref 39.0–52.0)
Hemoglobin: 10 g/dL — ABNORMAL LOW (ref 13.0–17.0)
MCH: 30.5 pg (ref 26.0–34.0)
MCHC: 32.8 g/dL (ref 30.0–36.0)
MCV: 93 fL (ref 80.0–100.0)
Platelets: 96 10*3/uL — ABNORMAL LOW (ref 150–400)
RBC: 3.28 MIL/uL — ABNORMAL LOW (ref 4.22–5.81)
RDW: 14.3 % (ref 11.5–15.5)
WBC: 9.8 10*3/uL (ref 4.0–10.5)
nRBC: 0 % (ref 0.0–0.2)

## 2021-06-12 MED ORDER — ENOXAPARIN SODIUM 40 MG/0.4ML IJ SOSY: 40.0000 mg | PREFILLED_SYRINGE | Freq: Two times a day (BID) | INTRAMUSCULAR | 0 refills | Status: DC

## 2021-06-12 NOTE — Progress Notes (Signed)
Physical Therapy Treatment Patient Details Name: Jeff Green MRN: [id number] DOB: 01-20-1956 Today's Date: 06/12/2021   History of Present Illness 66 yo male s/p L DA THA PMH: covid, shingles, afib, PE, HTN, IVC fliter, laminectomy    PT Comments    Pt continues to progress, meeting goals. Reviewed gait and stairs. Pt needing only min/guard for gait and stairs for safety. See below for details. Pt is ready to d./c from PT standpoint with spouse assist as needed.    Recommendations for follow up therapy are one component of a multi-disciplinary discharge planning process, led by the attending physician.  Recommendations may be updated based on patient status, additional functional criteria and insurance authorization.  Follow Up Recommendations  Follow physician's recommendations for discharge plan and follow up therapies     Assistance Recommended at Discharge Frequent or constant Supervision/Assistance  Patient can return home with the following A little help with walking and/or transfers;A little help with bathing/dressing/bathroom;Assistance with cooking/housework;Assist for transportation;Help with stairs or ramp for entrance   Equipment Recommendations  Rolling walker (2 wheels)    Recommendations for Other Services       Precautions / Restrictions Precautions Precautions: Fall Restrictions Weight Bearing Restrictions: No Other Position/Activity Restrictions: WBAT     Mobility  Bed Mobility Overal bed mobility: Needs Assistance Bed Mobility: Supine to Sit     Supine to sit: Modified independent (Device/Increase time)     General bed mobility comments: incr time, able to self  assist with LLE with gait belt.    Transfers Overall transfer level: Needs assistance Equipment used: Rolling walker (2 wheels) Transfers: Sit to/from Stand Sit to Stand: Supervision           General transfer comment: cues for hand placement and RLE management. supervision for  safety on rising and transition to RW    Ambulation/Gait Ambulation/Gait assistance: Min guard, Supervision Gait Distance (Feet): 100 Feet Assistive device: Rolling walker (2 wheels) Gait Pattern/deviations: Step-through pattern, Decreased stride length, Decreased stance time - left       General Gait Details: cues for progression, 3 brief standing rests d/t fatigue and pain   Stairs Stairs: Yes Stairs assistance: Min guard Stair Management: Two rails, Step to pattern Number of Stairs: 3 General stair comments: cues for sequence and safe technique. pt able to perform stairs with min/guard for safety.  advised pt he will likely need to take multiple rest breaks at home as he has 32 stairs. will need wife assist to place walker at top of landings and  wife providing min/guard for safety.   Wheelchair Mobility    Modified Rankin (Stroke Patients Only)       Balance                                            Cognition Arousal/Alertness: Awake/alert Behavior During Therapy: WFL for tasks assessed/performed Overall Cognitive Status: Within Functional Limits for tasks assessed                                          Exercises      General Comments        Pertinent Vitals/Pain Pain Assessment Pain Assessment: 0-10 Pain Score: 4  Pain Location: L hip and thigh Pain Descriptors / Indicators: Constant,  Discomfort, Grimacing, Guarding, Sore Pain Intervention(s): Limited activity within patient's tolerance, Monitored during session, Premedicated before session, Repositioned    Home Living                          Prior Function            PT Goals (current goals can now be found in the care plan section) Acute Rehab PT Goals Patient Stated Goal: home soon PT Goal Formulation: With patient Time For Goal Achievement: 06/17/21 Progress towards PT goals: Progressing toward goals    Frequency    7X/week      PT  Plan Current plan remains appropriate    Co-evaluation              AM-PAC PT "6 Clicks" Mobility   Outcome Measure  Help needed turning from your back to your side while in a flat bed without using bedrails?: A Little Help needed moving from lying on your back to sitting on the side of a flat bed without using bedrails?: A Little Help needed moving to and from a bed to a chair (including a wheelchair)?: A Little Help needed standing up from a chair using your arms (e.g., wheelchair or bedside chair)?: A Little Help needed to walk in hospital room?: A Little Help needed climbing 3-5 steps with a railing? : A Little 6 Click Score: 18    End of Session Equipment Utilized During Treatment: Gait belt Activity Tolerance: Patient tolerated treatment well Patient left: with call bell/phone within reach;in chair;with chair alarm set   PT Visit Diagnosis: Other abnormalities of gait and mobility (R26.89);Difficulty in walking, not elsewhere classified (R26.2);Pain Pain - Right/Left: Left Pain - part of body: Hip     Time: 1207-1230 PT Time Calculation (min) (ACUTE ONLY): 23 min  Charges:  $Gait Training: 23-37 mins                     Baxter Flattery, PT  Acute Rehab Dept ([place]) [phone] Pager [phone]  06/12/2021    [facility] 06/12/2021, 12:53 PM

## 2021-06-12 NOTE — Progress Notes (Signed)
The patient is injury-free, afebrile, alert, and oriented X 3. Vital signs were within the baseline during this shift. He complained of surgical hip pain which improves with current pain regimen. He denies chest pain, SOB, nausea, vomiting, dizziness, signs or symptoms of bleeding, or acute changes during this shift. We will continue to monitor and work toward achieving the care plan goals.

## 2021-06-12 NOTE — Progress Notes (Signed)
RN spoke with patient and wife via speaker phone at bedside. Patient expresses concern about patient coming home due to steps at their apartment. She expresses concern regarding patient being able to get in and out of the apartment safely. Reviewed patient's current progress and work with PT. Encouraged wife to engage family and friends to help her, especially during the first few days. She states that she does not have anyone to help her and that she has to go to work.  Wife to pick patient up around 1pm. She and patient confirm that there will be family friends at their home to help patient up the stairs. Wife reports that she works on-site where they live.  Ivan Anchors, RN 06/12/21 12:15 PM

## 2021-06-12 NOTE — Plan of Care (Signed)
Problem: Health Behavior/Discharge Planning: Goal: Ability to manage health-related needs will improve Outcome: Progressing   Problem: Clinical Measurements: Goal: Ability to maintain clinical measurements within normal limits will improve Outcome: Progressing   Problem: Activity: Goal: Risk for activity intolerance will decrease Outcome: [place], RN 06/12/21 10:19 AM

## 2021-06-12 NOTE — Progress Notes (Signed)
Subjective: 3 Days Post-Op Procedure(s) (LRB): TOTAL HIP ARTHROPLASTY ANTERIOR APPROACH (Left) Patient reports pain as Finally has improved.  He is able to move the leg much better.  He is feeling much better about his overall situation.   Objective: Vital signs in last 24 hours: Temp:  [97.6 F (36.4 C)-97.7 F (36.5 C)] 97.7 F (36.5 C) (02/19 2044) Pulse Rate:  [64-66] 66 (02/19 2044) Resp:  [18] 18 (02/19 2044) BP: (109-121)/(68-69) 109/68 (02/19 2044) SpO2:  [95 %] 95 % (02/19 2044)  Intake/Output from previous day: 02/19 0701 - 02/20 0700 In: 840 [P.O.:840] Out: 1500 [Urine:1500] Intake/Output this shift: Total I/O In: -  Out: 400 [Urine:400]  Recent Labs    06/10/21 0400 06/11/21 0324 06/12/21 0317  HGB 12.4* 10.8* 10.0*   Recent Labs    06/11/21 0324 06/12/21 0317  WBC 13.9* 9.8  RBC 3.57* 3.28*  HCT 32.9* 30.5*  PLT 102* 96*   No results for input(s): NA, K, CL, CO2, BUN, CREATININE, GLUCOSE, CALCIUM in the last 72 hours. Recent Labs    06/11/21 0324 06/12/21 0317  INR 1.2 1.3*    Neurologically intact ABD soft Neurovascular intact Sensation intact distally Intact pulses distally Dorsiflexion/Plantar flexion intact No cellulitis present Compartment soft   Assessment/Plan: 3 Days Post-Op Procedure(s) (LRB): TOTAL HIP ARTHROPLASTY ANTERIOR APPROACH (Left) Advance diet Up with therapy Discharge home with home health   Anticipated LOS equal to or greater than 2 midnights due to - Age 64 and older with one or more of the following:  - Obesity  - Expected need for hospital services (PT, OT, Nursing) required for safe  discharge  - Anticipated need for postoperative skilled nursing care or inpatient rehab  - Active co-morbidities: Chronic pain requiring opiods, Cardiac Arrhythmia, Anemia, and Patient has a significant problem at home given that he has to get up 3 flights of stairs to be able to get into his home. Patient should be old to be  discharged home at this point.  I will see him back in the office in 2 weeks.  He will start physical therapy as an outpatient at that point.  -  Alta Corning 06/12/2021, 9:34 AM

## 2021-06-12 NOTE — Plan of Care (Signed)

## 2021-06-12 NOTE — Plan of Care (Signed)
Patient discharged home via private vehicle with wife. Meeting with pharmacist prior to discharge to discuss Coumadin and Lovenox dosing. New AVS printed with updated orders and discharge instructions provided to patient. Ivan Anchors, RN 06/12/21 1:58 PM

## 2021-06-12 NOTE — Discharge Summary (Addendum)
Patient ID: Jeff Green MRN: [id number] DOB/AGE: May 09, 1955 66 y.o.  Admit date: 06/09/2021 Discharge date: 06/12/2021  Admission Diagnoses:  Principal Problem:   Primary osteoarthritis of left hip Active Problems:   History of pulmonary embolus (PE)   Discharge Diagnoses:  Same  Past Medical History:  Diagnosis Date   -+    Stent placed; [place]   Arthritis    Atrial fibrillation ([place])    Cancer [facility])    testicular cancer   Cataract, left eye    Congestive heart failure (CHF) (HCC)    DVT (deep venous thrombosis) (HCC)    High cholesterol    Pneumonia    Psychosis ([place])    Pulmonary embolus ([place])     Surgeries: Procedure(s):Left TOTAL HIP ARTHROPLASTY ANTERIOR APPROACH on 06/09/2021   Consultants:   Discharged Condition: Improved  Hospital Course: Jeff Green is an 66 y.o. male who was admitted 06/09/2021 for operative treatment ofPrimary osteoarthritis of left hip. Patient has severe unremitting pain that affects sleep, daily activities, and work/hobbies. After pre-op clearance the patient was taken to the operating room on 06/09/2021 and underwent  Procedure(s):Left TOTAL HIP ARTHROPLASTY ANTERIOR APPROACH.    Patient was given perioperative antibiotics:  Anti-infectives (From admission, onward)    Start     Dose/Rate Route Frequency Ordered Stop   06/09/21 2000  ceFAZolin (ANCEF) IVPB 2g/100 mL premix        2 g 200 mL/hr over 30 Minutes Intravenous Every 6 hours 06/09/21 1735 06/10/21 0134   06/09/21 0745  ceFAZolin (ANCEF) IVPB 3g/100 mL premix        3 g 200 mL/hr over 30 Minutes Intravenous On call to O.R. 06/09/21 0732 06/09/21 1452        Patient was given sequential compression devices, early ambulation, and chemoprophylaxis to prevent DVT.  Patient benefited maximally from hospital stay and there were no complications.    Recent vital signs: Patient Vitals for the past 24 hrs:  BP Temp Temp src Pulse Resp SpO2  06/11/21  2044 109/68 97.7 F (36.5 C) Oral 66 18 95 %  06/11/21 1349 121/69 97.6 F (36.4 C) Oral 64 18 95 %     Recent laboratory studies:  Recent Labs    06/11/21 0324 06/12/21 0317  WBC 13.9* 9.8  HGB 10.8* 10.0*  HCT 32.9* 30.5*  PLT 102* 96*  INR 1.2 1.3*     Discharge Medications:   Allergies as of 06/12/2021       Reactions   Lipitor [atorvastatin]    myalgias        Medication List     STOP taking these medications    cyclobenzaprine 10 MG tablet Commonly known as: FLEXERIL   Vitamin K (Phytonadione) 100 MCG Tabs       TAKE these medications    amitriptyline 50 MG tablet Commonly known as: ELAVIL TAKE ONE-HALF TABLET BY MOUTH AT BEDTIME FOR A WEEK, THEN 1 TABLET AT BEDITME   Biotin 5000 MCG Caps Take 5,000 mcg by mouth daily.   carvedilol 25 MG tablet Commonly known as: COREG TAKE ONE TABLET BY MOUTH TWICE A DAY WITH A MEAL   COQ-10 PO Take 200 mg by mouth daily.   diltiazem 120 MG 24 hr capsule Commonly known as: TIAZAC TAKE ONE CAPSULE BY MOUTH DAILY   diltiazem 120 MG 24 hr capsule Commonly known as: CARDIZEM CD TAKE ONE CAPSULE BY MOUTH DAILY   docusate sodium 100 MG capsule Commonly known as: Colace  Take 1 capsule (100 mg total) by mouth 2 (two) times daily.   enoxaparin 40 MG/0.4ML injection Commonly known as: LOVENOX Inject 0.4 mLs (40 mg total) into the skin every 12 (twelve) hours. What changed:  how much to take how to take this when to take this additional instructions   fenofibrate 160 MG tablet TAKE ONE TABLET BY MOUTH DAILY   GLUCOSAMINE CHONDROITIN COMPLX PO Take 2 tablets by mouth daily.   multivitamin capsule Take 1 capsule by mouth daily.   NEEDLE (DISP) 18 G 18G X 1-1/2" Misc Use as needed for testosterone injections   oxyCODONE-acetaminophen 5-325 MG tablet Commonly known as: PERCOCET/ROXICET Take 1-2 tablets by mouth every 6 (six) hours as needed for severe pain.   PROBIOTIC DAILY PO Take 1 capsule by  mouth.   rosuvastatin 40 MG tablet Commonly known as: CRESTOR Take 1 tablet (40 mg total) by mouth daily.   SYRINGE 3CC/22GX1" 22G X 1" 3 ML Misc Use as needed for testosterone injections   testosterone cypionate 200 MG/ML injection Commonly known as: DEPOTESTOSTERONE CYPIONATE 36mL IM every other week.   tiZANidine 2 MG tablet Commonly known as: ZANAFLEX Take 1 tablet (2 mg total) by mouth every 8 (eight) hours as needed for muscle spasms.   topiramate 100 MG tablet Commonly known as: TOPAMAX Take 1 tablet (100 mg total) by mouth 2 (two) times daily.   traMADol 50 MG tablet Commonly known as: ULTRAM TAKE ONE TO TWO TABLETS BY MOUTH EVERY 8 HOURS AS NEEDED FOR MODERATE PAIN, MAX DAILY DOSE OF 6 TABLETS PER DAY   warfarin 7.5 MG tablet Commonly known as: COUMADIN Take as directed. If you are unsure how to take this medication, talk to your nurse or doctor. Original instructions: TAKE ONE TABLET BY MOUTH DAILY What changed:  how much to take when to take this additional instructions               Durable Medical Equipment  (From admission, onward)           Start     Ordered   06/09/21 1736  DME Walker rolling  Once       Question:  Patient needs a walker to treat with the following condition  Answer:  Primary osteoarthritis of right hip   06/09/21 1735   06/09/21 1736  DME 3 n 1  Once        06/09/21 1735              Discharge Care Instructions  (From admission, onward)           Start     Ordered   06/10/21 0000  Weight bearing as tolerated        06/10/21 0833   06/10/21 0000  Weight bearing as tolerated        06/10/21 0838            Diagnostic Studies: DG Chest 2 View  Result Date: 05/29/2021 CLINICAL DATA:  Preoperative evaluation. EXAM: CHEST - 2 VIEW COMPARISON:  July 12, 2015 FINDINGS: Decreased lung volumes are seen. Mild, stable scarring and/or atelectasis is noted within the left lung base. There is no evidence of a  pleural effusion or pneumothorax. The heart size and mediastinal contours are within normal limits. The visualized skeletal structures are unremarkable. IMPRESSION: Mild, stable left basilar scarring and/or atelectasis. Electronically Signed   By: Virgina Norfolk M.D.   On: 05/29/2021 19:04   DG C-Arm 1-60 Min-No Report  Result Date: 06/09/2021 Fluoroscopy was utilized by the requesting physician.  No radiographic interpretation.   DG C-Arm 1-60 Min-No Report  Result Date: 06/09/2021 Fluoroscopy was utilized by the requesting physician.  No radiographic interpretation.   DG HIP UNILAT WITH PELVIS 1V LEFT  Result Date: 06/09/2021 CLINICAL DATA:  Fluoroscopy for left hip replacement. EXAM: DG HIP (WITH OR WITHOUT PELVIS) 1V*L* COMPARISON:  Pelvis and bilateral hip radiographs 04/27/2021 FINDINGS: Images were performed intraoperatively without the presence of a radiologist. Total fluoro time: 15 seconds Dose: 2.83 mGy Total images: 3 The patient appears to be undergoing total left hip arthroplasty. Please see intraoperative findings for further detail. IMPRESSION: Fluoroscopy for total left hip arthroplasty. Electronically Signed   By: Yvonne Kendall M.D.   On: 06/09/2021 16:07    Disposition: Discharge disposition: 06-Home-Health Care Svc       Discharge Instructions     Call MD / Call 911   Complete by: As directed    If you experience chest pain or shortness of breath, CALL 911 and be transported to the hospital emergency room.  If you develope a fever above 101 F, pus (white drainage) or increased drainage or redness at the wound, or calf pain, call your surgeon's office.   Call MD / Call 911   Complete by: As directed    If you experience chest pain or shortness of breath, CALL 911 and be transported to the hospital emergency room.  If you develope a fever above 101 F, pus (white drainage) or increased drainage or redness at the wound, or calf pain, call your surgeon's office.    Constipation Prevention   Complete by: As directed    Drink plenty of fluids.  Prune juice may be helpful.  You may use a stool softener, such as Colace (over the counter) 100 mg twice a day.  Use MiraLax (over the counter) for constipation as needed.   Constipation Prevention   Complete by: As directed    Drink plenty of fluids.  Prune juice may be helpful.  You may use a stool softener, such as Colace (over the counter) 100 mg twice a day.  Use MiraLax (over the counter) for constipation as needed.   Diet - low sodium heart healthy   Complete by: As directed    Driving restrictions   Complete by: As directed    No driving for 2 weeks   Driving restrictions   Complete by: As directed    No driving for 2 weeks   Increase activity slowly as tolerated   Complete by: As directed    Increase activity slowly as tolerated   Complete by: As directed    Patient may shower   Complete by: As directed    You may shower without a dressing once there is no drainage.  Do not wash over the wound.  If drainage remains, cover wound with plastic wrap and then shower.   Patient may shower   Complete by: As directed    You may shower without a dressing once there is no drainage.  Do not wash over the wound.  If drainage remains, cover wound with plastic wrap and then shower.   Post-operative opioid taper instructions:   Complete by: As directed    POST-OPERATIVE OPIOID TAPER INSTRUCTIONS: It is important to wean off of your opioid medication as soon as possible. If you do not need pain medication after your surgery it is ok to stop day one. Opioids include: Codeine, Hydrocodone(Norco,  Vicodin), Oxycodone(Percocet, oxycontin) and hydromorphone amongst others.  Long term and even short term use of opiods can cause: Increased pain response Dependence Constipation Depression Respiratory depression And more.  Withdrawal symptoms can include Flu like symptoms Nausea, vomiting And more Techniques to  manage these symptoms Hydrate well Eat regular healthy meals Stay active Use relaxation techniques(deep breathing, meditating, yoga) Do Not substitute Alcohol to help with tapering If you have been on opioids for less than two weeks and do not have pain than it is ok to stop all together.  Plan to wean off of opioids This plan should start within one week post op of your joint replacement. Maintain the same interval or time between taking each dose and first decrease the dose.  Cut the total daily intake of opioids by one tablet each day Next start to increase the time between doses. The last dose that should be eliminated is the evening dose.      Post-operative opioid taper instructions:   Complete by: As directed    POST-OPERATIVE OPIOID TAPER INSTRUCTIONS: It is important to wean off of your opioid medication as soon as possible. If you do not need pain medication after your surgery it is ok to stop day one. Opioids include: Codeine, Hydrocodone(Norco, Vicodin), Oxycodone(Percocet, oxycontin) and hydromorphone amongst others.  Long term and even short term use of opiods can cause: Increased pain response Dependence Constipation Depression Respiratory depression And more.  Withdrawal symptoms can include Flu like symptoms Nausea, vomiting And more Techniques to manage these symptoms Hydrate well Eat regular healthy meals Stay active Use relaxation techniques(deep breathing, meditating, yoga) Do Not substitute Alcohol to help with tapering If you have been on opioids for less than two weeks and do not have pain than it is ok to stop all together.  Plan to wean off of opioids This plan should start within one week post op of your joint replacement. Maintain the same interval or time between taking each dose and first decrease the dose.  Cut the total daily intake of opioids by one tablet each day Next start to increase the time between doses. The last dose that should be  eliminated is the evening dose.      Weight bearing as tolerated   Complete by: As directed    Weight bearing as tolerated   Complete by: As directed         Follow-up Information     Dorna Leitz, MD. Go on 06/22/2021.   Specialty: Orthopedic Surgery Why: Your appointment has been scheduled for 11:00. Contact information: [place] [address] [phone]         [facility] Out[facility] Therapy- Jule Ser. Go on 06/23/2021.   Why: Your appointment has been scheduled. They will call you with the time Contact information: [place], [place] Follow up.   Specialty: [place] Why: You will receive 6 HHPT visits prior to starting outpatient physical therapy Contact information: [address] [place] [address] [phone]                  Signed: Erlene Senters 06/12/2021, 1:47 PM

## 2021-06-14 DIAGNOSIS — I509 Heart failure, unspecified: Secondary | ICD-10-CM | POA: Diagnosis not present

## 2021-06-14 DIAGNOSIS — G4733 Obstructive sleep apnea (adult) (pediatric): Secondary | ICD-10-CM | POA: Diagnosis not present

## 2021-06-14 DIAGNOSIS — G4700 Insomnia, unspecified: Secondary | ICD-10-CM | POA: Diagnosis not present

## 2021-06-14 DIAGNOSIS — D696 Thrombocytopenia, unspecified: Secondary | ICD-10-CM | POA: Diagnosis not present

## 2021-06-14 DIAGNOSIS — E7800 Pure hypercholesterolemia, unspecified: Secondary | ICD-10-CM | POA: Diagnosis not present

## 2021-06-14 DIAGNOSIS — Z7901 Long term (current) use of anticoagulants: Secondary | ICD-10-CM | POA: Diagnosis not present

## 2021-06-14 DIAGNOSIS — M48061 Spinal stenosis, lumbar region without neurogenic claudication: Secondary | ICD-10-CM | POA: Diagnosis not present

## 2021-06-14 DIAGNOSIS — Z471 Aftercare following joint replacement surgery: Secondary | ICD-10-CM | POA: Diagnosis not present

## 2021-06-14 DIAGNOSIS — H7293 Unspecified perforation of tympanic membrane, bilateral: Secondary | ICD-10-CM | POA: Diagnosis not present

## 2021-06-14 DIAGNOSIS — M1711 Unilateral primary osteoarthritis, right knee: Secondary | ICD-10-CM | POA: Diagnosis not present

## 2021-06-14 DIAGNOSIS — I2510 Atherosclerotic heart disease of native coronary artery without angina pectoris: Secondary | ICD-10-CM | POA: Diagnosis not present

## 2021-06-14 DIAGNOSIS — I480 Paroxysmal atrial fibrillation: Secondary | ICD-10-CM | POA: Diagnosis not present

## 2021-06-14 DIAGNOSIS — L821 Other seborrheic keratosis: Secondary | ICD-10-CM | POA: Diagnosis not present

## 2021-06-14 DIAGNOSIS — E119 Type 2 diabetes mellitus without complications: Secondary | ICD-10-CM | POA: Diagnosis not present

## 2021-06-14 DIAGNOSIS — D751 Secondary polycythemia: Secondary | ICD-10-CM | POA: Diagnosis not present

## 2021-06-14 DIAGNOSIS — M19012 Primary osteoarthritis, left shoulder: Secondary | ICD-10-CM | POA: Diagnosis not present

## 2021-06-14 DIAGNOSIS — R918 Other nonspecific abnormal finding of lung field: Secondary | ICD-10-CM | POA: Diagnosis not present

## 2021-06-14 DIAGNOSIS — I110 Hypertensive heart disease with heart failure: Secondary | ICD-10-CM | POA: Diagnosis not present

## 2021-06-14 DIAGNOSIS — M19011 Primary osteoarthritis, right shoulder: Secondary | ICD-10-CM | POA: Diagnosis not present

## 2021-06-14 DIAGNOSIS — Z96642 Presence of left artificial hip joint: Secondary | ICD-10-CM | POA: Diagnosis not present

## 2021-06-16 DIAGNOSIS — I480 Paroxysmal atrial fibrillation: Secondary | ICD-10-CM | POA: Diagnosis not present

## 2021-06-16 DIAGNOSIS — I2510 Atherosclerotic heart disease of native coronary artery without angina pectoris: Secondary | ICD-10-CM | POA: Diagnosis not present

## 2021-06-16 DIAGNOSIS — G4700 Insomnia, unspecified: Secondary | ICD-10-CM | POA: Diagnosis not present

## 2021-06-16 DIAGNOSIS — I509 Heart failure, unspecified: Secondary | ICD-10-CM | POA: Diagnosis not present

## 2021-06-16 DIAGNOSIS — H7293 Unspecified perforation of tympanic membrane, bilateral: Secondary | ICD-10-CM | POA: Diagnosis not present

## 2021-06-16 DIAGNOSIS — Z7901 Long term (current) use of anticoagulants: Secondary | ICD-10-CM | POA: Diagnosis not present

## 2021-06-16 DIAGNOSIS — D696 Thrombocytopenia, unspecified: Secondary | ICD-10-CM | POA: Diagnosis not present

## 2021-06-16 DIAGNOSIS — E119 Type 2 diabetes mellitus without complications: Secondary | ICD-10-CM | POA: Diagnosis not present

## 2021-06-16 DIAGNOSIS — I110 Hypertensive heart disease with heart failure: Secondary | ICD-10-CM | POA: Diagnosis not present

## 2021-06-16 DIAGNOSIS — M19012 Primary osteoarthritis, left shoulder: Secondary | ICD-10-CM | POA: Diagnosis not present

## 2021-06-16 DIAGNOSIS — G4733 Obstructive sleep apnea (adult) (pediatric): Secondary | ICD-10-CM | POA: Diagnosis not present

## 2021-06-16 DIAGNOSIS — E7800 Pure hypercholesterolemia, unspecified: Secondary | ICD-10-CM | POA: Diagnosis not present

## 2021-06-16 DIAGNOSIS — M1711 Unilateral primary osteoarthritis, right knee: Secondary | ICD-10-CM | POA: Diagnosis not present

## 2021-06-16 DIAGNOSIS — M48061 Spinal stenosis, lumbar region without neurogenic claudication: Secondary | ICD-10-CM | POA: Diagnosis not present

## 2021-06-16 DIAGNOSIS — R918 Other nonspecific abnormal finding of lung field: Secondary | ICD-10-CM | POA: Diagnosis not present

## 2021-06-16 DIAGNOSIS — Z471 Aftercare following joint replacement surgery: Secondary | ICD-10-CM | POA: Diagnosis not present

## 2021-06-16 DIAGNOSIS — D751 Secondary polycythemia: Secondary | ICD-10-CM | POA: Diagnosis not present

## 2021-06-16 DIAGNOSIS — L821 Other seborrheic keratosis: Secondary | ICD-10-CM | POA: Diagnosis not present

## 2021-06-16 DIAGNOSIS — Z96642 Presence of left artificial hip joint: Secondary | ICD-10-CM | POA: Diagnosis not present

## 2021-06-16 DIAGNOSIS — M19011 Primary osteoarthritis, right shoulder: Secondary | ICD-10-CM | POA: Diagnosis not present

## 2021-06-19 DIAGNOSIS — I2510 Atherosclerotic heart disease of native coronary artery without angina pectoris: Secondary | ICD-10-CM | POA: Diagnosis not present

## 2021-06-19 DIAGNOSIS — E119 Type 2 diabetes mellitus without complications: Secondary | ICD-10-CM | POA: Diagnosis not present

## 2021-06-19 DIAGNOSIS — M19011 Primary osteoarthritis, right shoulder: Secondary | ICD-10-CM | POA: Diagnosis not present

## 2021-06-19 DIAGNOSIS — Z7901 Long term (current) use of anticoagulants: Secondary | ICD-10-CM | POA: Diagnosis not present

## 2021-06-19 DIAGNOSIS — E7800 Pure hypercholesterolemia, unspecified: Secondary | ICD-10-CM | POA: Diagnosis not present

## 2021-06-19 DIAGNOSIS — H7293 Unspecified perforation of tympanic membrane, bilateral: Secondary | ICD-10-CM | POA: Diagnosis not present

## 2021-06-19 DIAGNOSIS — G4700 Insomnia, unspecified: Secondary | ICD-10-CM | POA: Diagnosis not present

## 2021-06-19 DIAGNOSIS — Z471 Aftercare following joint replacement surgery: Secondary | ICD-10-CM | POA: Diagnosis not present

## 2021-06-19 DIAGNOSIS — I509 Heart failure, unspecified: Secondary | ICD-10-CM | POA: Diagnosis not present

## 2021-06-19 DIAGNOSIS — I480 Paroxysmal atrial fibrillation: Secondary | ICD-10-CM | POA: Diagnosis not present

## 2021-06-19 DIAGNOSIS — G4733 Obstructive sleep apnea (adult) (pediatric): Secondary | ICD-10-CM | POA: Diagnosis not present

## 2021-06-19 DIAGNOSIS — R918 Other nonspecific abnormal finding of lung field: Secondary | ICD-10-CM | POA: Diagnosis not present

## 2021-06-19 DIAGNOSIS — M19012 Primary osteoarthritis, left shoulder: Secondary | ICD-10-CM | POA: Diagnosis not present

## 2021-06-19 DIAGNOSIS — M1711 Unilateral primary osteoarthritis, right knee: Secondary | ICD-10-CM | POA: Diagnosis not present

## 2021-06-19 DIAGNOSIS — I110 Hypertensive heart disease with heart failure: Secondary | ICD-10-CM | POA: Diagnosis not present

## 2021-06-19 DIAGNOSIS — D751 Secondary polycythemia: Secondary | ICD-10-CM | POA: Diagnosis not present

## 2021-06-19 DIAGNOSIS — M48061 Spinal stenosis, lumbar region without neurogenic claudication: Secondary | ICD-10-CM | POA: Diagnosis not present

## 2021-06-19 DIAGNOSIS — Z96642 Presence of left artificial hip joint: Secondary | ICD-10-CM | POA: Diagnosis not present

## 2021-06-19 DIAGNOSIS — L821 Other seborrheic keratosis: Secondary | ICD-10-CM | POA: Diagnosis not present

## 2021-06-19 DIAGNOSIS — D696 Thrombocytopenia, unspecified: Secondary | ICD-10-CM | POA: Diagnosis not present

## 2021-06-21 DIAGNOSIS — M19012 Primary osteoarthritis, left shoulder: Secondary | ICD-10-CM | POA: Diagnosis not present

## 2021-06-21 DIAGNOSIS — L821 Other seborrheic keratosis: Secondary | ICD-10-CM | POA: Diagnosis not present

## 2021-06-21 DIAGNOSIS — Z471 Aftercare following joint replacement surgery: Secondary | ICD-10-CM | POA: Diagnosis not present

## 2021-06-21 DIAGNOSIS — M1711 Unilateral primary osteoarthritis, right knee: Secondary | ICD-10-CM | POA: Diagnosis not present

## 2021-06-21 DIAGNOSIS — E7800 Pure hypercholesterolemia, unspecified: Secondary | ICD-10-CM | POA: Diagnosis not present

## 2021-06-21 DIAGNOSIS — G4700 Insomnia, unspecified: Secondary | ICD-10-CM | POA: Diagnosis not present

## 2021-06-21 DIAGNOSIS — I2510 Atherosclerotic heart disease of native coronary artery without angina pectoris: Secondary | ICD-10-CM | POA: Diagnosis not present

## 2021-06-21 DIAGNOSIS — I110 Hypertensive heart disease with heart failure: Secondary | ICD-10-CM | POA: Diagnosis not present

## 2021-06-21 DIAGNOSIS — Z7901 Long term (current) use of anticoagulants: Secondary | ICD-10-CM | POA: Diagnosis not present

## 2021-06-21 DIAGNOSIS — R918 Other nonspecific abnormal finding of lung field: Secondary | ICD-10-CM | POA: Diagnosis not present

## 2021-06-21 DIAGNOSIS — Z96642 Presence of left artificial hip joint: Secondary | ICD-10-CM | POA: Diagnosis not present

## 2021-06-21 DIAGNOSIS — D751 Secondary polycythemia: Secondary | ICD-10-CM | POA: Diagnosis not present

## 2021-06-21 DIAGNOSIS — G4733 Obstructive sleep apnea (adult) (pediatric): Secondary | ICD-10-CM | POA: Diagnosis not present

## 2021-06-21 DIAGNOSIS — E119 Type 2 diabetes mellitus without complications: Secondary | ICD-10-CM | POA: Diagnosis not present

## 2021-06-21 DIAGNOSIS — H7293 Unspecified perforation of tympanic membrane, bilateral: Secondary | ICD-10-CM | POA: Diagnosis not present

## 2021-06-21 DIAGNOSIS — M19011 Primary osteoarthritis, right shoulder: Secondary | ICD-10-CM | POA: Diagnosis not present

## 2021-06-21 DIAGNOSIS — D696 Thrombocytopenia, unspecified: Secondary | ICD-10-CM | POA: Diagnosis not present

## 2021-06-21 DIAGNOSIS — I480 Paroxysmal atrial fibrillation: Secondary | ICD-10-CM | POA: Diagnosis not present

## 2021-06-21 DIAGNOSIS — I509 Heart failure, unspecified: Secondary | ICD-10-CM | POA: Diagnosis not present

## 2021-06-21 DIAGNOSIS — M48061 Spinal stenosis, lumbar region without neurogenic claudication: Secondary | ICD-10-CM | POA: Diagnosis not present

## 2021-06-22 DIAGNOSIS — Z9889 Other specified postprocedural states: Secondary | ICD-10-CM | POA: Diagnosis not present

## 2021-06-22 DIAGNOSIS — M1612 Unilateral primary osteoarthritis, left hip: Secondary | ICD-10-CM | POA: Diagnosis not present

## 2021-06-23 ENCOUNTER — Ambulatory Visit: Payer: Medicare Other | Admitting: Physical Therapy

## 2021-06-26 ENCOUNTER — Ambulatory Visit: Payer: Medicare Other | Admitting: Physical Therapy

## 2021-07-03 ENCOUNTER — Other Ambulatory Visit: Payer: Self-pay

## 2021-07-03 ENCOUNTER — Ambulatory Visit: Payer: Medicare Other | Attending: Orthopedic Surgery | Admitting: Rehabilitative and Restorative Service Providers"

## 2021-07-03 ENCOUNTER — Encounter: Payer: Self-pay | Admitting: Rehabilitative and Restorative Service Providers"

## 2021-07-03 DIAGNOSIS — M25552 Pain in left hip: Secondary | ICD-10-CM | POA: Insufficient documentation

## 2021-07-03 DIAGNOSIS — R600 Localized edema: Secondary | ICD-10-CM | POA: Diagnosis not present

## 2021-07-03 DIAGNOSIS — M6281 Muscle weakness (generalized): Secondary | ICD-10-CM | POA: Insufficient documentation

## 2021-07-03 DIAGNOSIS — R2689 Other abnormalities of gait and mobility: Secondary | ICD-10-CM | POA: Diagnosis not present

## 2021-07-03 NOTE — Patient Instructions (Signed)
Access Code: [email or web address] ?URL: [email or web address] ?Date: 07/03/2021 ?Prepared by: Rudell Cobb ? ?Exercises ?Beginner Bridge - 2 x daily - 7 x weekly - 1 sets - 10 reps ?Supine Active Straight Leg Raise - 2 x daily - 7 x weekly - 1 sets - 10 reps ?Standing Hip Abduction AROM - 2 x daily - 7 x weekly - 1 sets - 10 reps ?March in Place - 2 x daily - 7 x weekly - 1 sets - 10 reps ? ?Patient Education ?Kinesiology tape ?

## 2021-07-03 NOTE — Therapy (Signed)
[facility] [place], [place], [address] Phone: [phone]   Fax:  [phone]  Physical Therapy Treatment  Patient Details  Name: Jeff Green MRN: [id number] Date of Birth: Apr 06, 1957 Referring Provider (PT): Dorna Leitz, MD   Encounter Date: 07/03/2021   PT End of Session - 07/03/21 1303     Visit Number 1    Number of Visits 16    Date for PT Re-Evaluation 09/01/21    Authorization Type [employer] medicare    PT Start Time 1103    PT Stop Time 1149    PT Time Calculation (min) 46 min             Past Medical History:  Diagnosis Date   -+    Stent placed; [place]   Arthritis    Atrial fibrillation ([place])    Cancer [facility])    testicular cancer   Cataract, left eye    Congestive heart failure (CHF) (HCC)    DVT (deep venous thrombosis) (HCC)    High cholesterol    Pneumonia    Psychosis ([place])    Pulmonary embolus ([place])     Past Surgical History:  Procedure Laterality Date   HERNIA REPAIR     IVC  2014   lamenectomy     left orchiectomy     TOTAL HIP ARTHROPLASTY Left 06/09/2021   Procedure: TOTAL HIP ARTHROPLASTY ANTERIOR APPROACH;  Surgeon: Dorna Leitz, MD;  Location: [facility];  Service: Orthopedics;  Laterality: Left;    There were no vitals filed for this visit.   Subjective Assessment - 07/03/21 1103     Subjective The patient is s/p L THA anterior approach on 06/09/2021 due to prolonged arthritis and pain.  He reports a constant charlie horse feeling 24/7 in the L lateral thigh that he states is a "blood sac"-- he has talked to his MD about this symptom. He is walking around the house with a single point cane.    Pertinent History h/o Covid, shingles, a-fib, PE, HTN, IVC filter, h/o DVT, h/o back surgery (R foot drop)    Patient Stated Goals be "back to normal" again-- matching the right leg    Currently in Pain? Yes    Pain Score 6    upon waking, increases when doing  stairs and getting into car   Pain Location Hip    Pain Orientation Left    Pain Descriptors / Indicators Discomfort;Tightness;Cramping    Pain Type Acute pain;Surgical pain    Pain Onset 1 to 4 weeks ago    Pain Frequency Constant    Aggravating Factors  constant sensation of grabbing in the L lateral thight "charlie horse" sensation, stairs and getting into car    Pain Relieving Factors movement    Effect of Pain on Daily Activities limits sleep (2 hours)                [facility] PT Assessment - 07/03/21 1115       Assessment   Medical Diagnosis total hip replacement    Referring Provider (PT) Dorna Leitz, MD    Onset Date/Surgical Date 06/09/21    Prior Therapy acute care      Precautions   Precautions Anterior Hip      Restrictions   Weight Bearing Restrictions Yes    LLE Weight Bearing Weight bearing as tolerated      Balance Screen   Has the patient fallen in the past 6  months Yes    How many times? 1- turned wrong and the leg was fatigued/ no injury    Has the patient had a decrease in activity level because of a fear of falling?  Yes   due to surgery   Is the patient reluctant to leave their home because of a fear of falling?  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     Observation/Other Assessments   Focus on Therapeutic Outcomes (FOTO)  29%      Sensation   Light Touch Appears Intact      ROM / Strength   AROM / PROM / Strength AROM;Strength      AROM   Overall AROM  Deficits    Overall AROM Comments L hip flexion to 85 degrees  and then notes discomfort      Strength   Overall Strength Deficits    Strength Assessment Site Hip;Knee;Ankle    Right/Left Hip Right;Left    Right Hip Flexion 4-/5    Left Hip Flexion 3/5    Left Hip ABduction 3/5    Right/Left Knee Right;Left    Right Knee Flexion 5/5    Right Knee Extension 5/5    Left Knee Flexion 3/5    Left Knee Extension 4/5    Right/Left Ankle Right;Left    Right Ankle Dorsiflexion 3/5    Left Ankle Dorsiflexion 4/5      Flexibility   Soft Tissue Assessment /Muscle Length yes    Hamstrings hamstrings and gastrocs tight bilat L > R      Palpation   Palpation comment tender to palpation L lateral thigh and IT band/quad,      Ambulation/Gait   Ambulation/Gait Yes    Ambulation/Gait Assistance 6: Modified independent (Device/Increase time)    Ambulation Distance (Feet) 30 Feet   stops every 25-30 feet to rest against wall or stair rail   Assistive device Straight cane    Gait Pattern Step-through pattern;Trendelenburg;Lateral hip instability;Poor foot clearance - right;Decreased stride length;Decreased stance time - left   R foot slap, excessive hip rotation to compensate for weakness and instability   Ambulation Surface Level;Indoor    Gait velocity TBA    Gait Comments TBA- stairs                           [facility] Adult PT Treatment/Exercise - 07/03/21 1115       Exercises   Exercises Knee/Hip      Knee/Hip Exercises: Standing   Heel Raises Both    Heel Raises Limitations *unable due to chronic ankle weakness from prior back surgery in the past    Hip Flexion Stengthening;Left;10 reps      Knee/Hip Exercises: Supine   Quad Sets Strengthening;Left;5 reps    Bridges Strengthening;Both;10 reps    Straight Leg Raises Strengthening;Left;10 reps    Straight Leg Raises Limitations with quad set      Manual  Therapy   Manual Therapy Soft tissue mobilization;Taping    Soft tissue mobilization STM over L lateral thigh and quad     Kinesiotex Create Space;Edema   basketweave pattern for tissue decompression L lateral thigh                      PT Short Term Goals - 07/03/21 1310       PT SHORT TERM GOAL #1   Title The patient will be indep with HEP for LE strength, flexibility and mobility.    Time 4    Period Weeks    Target Date 08/02/21      PT SHORT TERM GOAL #2   Title The patient will report pain in L thigh/hip to be < or equal to 2/10.    Time 4    Period Weeks    Target Date 08/02/21      PT SHORT TERM GOAL #3   Title The patient will be further assessed on stairs and will be mod indep with step to pattern for 12 steps.    Time 4    Period Weeks    Target Date 08/02/21      PT SHORT TERM GOAL #4   Title The patient will ambulate x 200 feet nonstop without a standing rest break to demo improved muscle endurance and strength for functional tasks.    Time 4    Period Weeks    Target Date 08/02/21               PT Long Term Goals - 07/03/21 1314       PT LONG TERM GOAL #1   Title The patient will be indep with HEP progression.    Time 8    Period Weeks    Target Date 09/01/21      PT LONG TERM GOAL #2   Title The patient will improve L hip strength to 4/5.    Time 8    Period Weeks    Target Date 09/01/21      PT LONG TERM GOAL #3   Title The patient will negotiate 12 steps with reciprocal pattern mod indep with one rail.    Time 8    Period Weeks    Target Date 09/01/21      PT LONG TERM GOAL #4   Title The patient will improve gait speed by 0.6 ft/sec to demo improving functional mobility and dec'd risk for falls.    Time 8    Period Weeks    Target Date 09/01/21      PT LONG TERM GOAL #5   Title The patient will report no pain with ambulation >400 ft in the L hip.    Time 8    Period Weeks    Target Date 09/01/21                   Plan - 07/03/21 1151     Clinical Impression Statement The patient is a 66 yo male presenting to OP physical  therapy s/p anterior THA on 06/09/21.  He presents with lateral thigh pain, anterior/groin discomfort with movement, dec'd AROM, weakness in L hip flexors, knee flexors, and chronic weakness in R DF and R hip flexion (from prior back surgery).  He also presents with dec'd gait speed, dec'd balance, difficulty with stairs and ADLs.  PT to address deficits to promote improved functional mobility.    Personal Factors and Comorbidities Comorbidity 3+  Comorbidities h/o back surgery, afib, dvt and PE    Examination-Activity Limitations Bed Mobility;Locomotion Level;Sit;Sleep;Squat;Stairs;Stand;Carry;Lift    Examination-Participation Restrictions Community Activity    Stability/Clinical Decision Making Stable/Uncomplicated    Clinical Decision Making Low    Rehab Potential Good    PT Frequency 2x / week    PT Duration 8 weeks    PT Treatment/Interventions ADLs/Self Care Home Management;Gait training;Stair training;Functional mobility training;Therapeutic activities;Therapeutic exercise;Balance training;Neuromuscular re-education;DME Instruction;Cryotherapy;Electrical Stimulation;Moist Heat;Manual techniques;Passive range of motion;Patient/family education;Taping    PT Next Visit Plan check gait speed, stair assessment.  Did tape help edema lateral thigh?  Bilateral LE strengthening, stretching, gait and core stability.    PT Home Exercise Plan [email or web address]    Consulted and Agree with Plan of Care Patient             Patient will benefit from skilled therapeutic intervention in order to improve the following deficits and impairments:  Abnormal gait, Decreased range of motion, Increased fascial restricitons, Decreased strength, Decreased balance, Decreased activity tolerance, Impaired tone, Decreased endurance, Pain, Impaired flexibility, Increased edema  Visit Diagnosis: Muscle weakness (generalized)  Localized edema  Other abnormalities of gait and mobility  Pain in left hip     Problem  List Patient Active Problem List   Diagnosis Date Noted   Primary osteoarthritis of left hip 06/09/2021   Anticoagulated on Coumadin 05/16/2021   Left hip pain 02/17/2021   Shingles 12/19/2020   Primary osteoarthritis of right knee 09/06/2020   H/O hernia repair 06/20/2020   Intertrigo 03/21/2020   Acute irritant rhinitis 02/10/2020   COVID-19 lung 01/20/2020   Thrombocytopenia ([place]) 08/12/2018   Liver lesion 08/12/2018   Lung nodule 08/12/2018   Presence of IVC filter 08/06/2018   Hypercalcemia due to a drug 07/30/2018   DVT (deep venous thrombosis) ([place]) 07/29/2018   Renal insufficiency 06/27/2018   Seborrheic keratosis 05/29/2017   Insomnia 04/05/2017   OSA (obstructive sleep apnea) 03/21/2017   Primary osteoarthritis of both shoulders 11/28/2016   Controlled type 2 diabetes mellitus without complication, without long-term current use of insulin ([place]) 06/08/2016   Seborrheic dermatitis 06/08/2016   Morbid obesity ([place]) 06/08/2016   Ruptured tympanic membrane, bilateral 01/27/2016   Annual physical exam 01/25/2016   Paroxysmal atrial fibrillation ([place]) 06/15/2015   Polycythemia, secondary 08/26/2014   Hemochromatosis 08/26/2014   Multiple pulmonary nodules 08/16/2014   Testicular cancer ([place]) 08/12/2014   Hyperlipidemia 08/10/2014   Hypogonadism in male 08/10/2014   Essential hypertension 08/10/2014   Spinal stenosis of lumbar region 08/10/2014   History of pulmonary embolus (PE) 08/10/2014   CAD (coronary artery disease), native coronary artery 08/10/2014    Noemy Hallmon, PT 07/03/2021, 1:32 PM  [facility] [place], [place], [address] Phone: [phone]   Fax:  [phone]  Name: Glenn Gullickson MRN: [id number] Date of Birth: [birth year]

## 2021-07-06 ENCOUNTER — Ambulatory Visit: Payer: Medicare Other | Admitting: Physical Therapy

## 2021-07-06 ENCOUNTER — Other Ambulatory Visit: Payer: Self-pay

## 2021-07-06 DIAGNOSIS — M25552 Pain in left hip: Secondary | ICD-10-CM | POA: Diagnosis not present

## 2021-07-06 DIAGNOSIS — R600 Localized edema: Secondary | ICD-10-CM | POA: Diagnosis not present

## 2021-07-06 DIAGNOSIS — M6281 Muscle weakness (generalized): Secondary | ICD-10-CM | POA: Diagnosis not present

## 2021-07-06 DIAGNOSIS — R2689 Other abnormalities of gait and mobility: Secondary | ICD-10-CM | POA: Diagnosis not present

## 2021-07-06 NOTE — Patient Instructions (Signed)
Access Code: [email or web address] ?URL: [email or web address] ?Date: 07/06/2021 ?Prepared by: Isabelle Course ? ?Patient Education ?[facility] [employer] ?

## 2021-07-06 NOTE — Therapy (Signed)
[facility] ?[address] ?[place], [place], [address] ?Phone: [phone]   Fax:  [phone] ? ?Physical Therapy Treatment ? ?Patient Details  ?Name: Jeff Green ?MRN: [id number] ?Date of Birth: Aug 29, 1955 ?Referring Provider (PT): Dorna Leitz, MD ? ? ?Encounter Date: 07/06/2021 ? ? PT End of Session - 07/06/21 1054   ? ? Visit Number 2   ? Number of Visits 16   ? Date for PT Re-Evaluation 09/01/21   ? Authorization Type UHC medicare   ? PT Start Time 1010   ? PT Stop Time 2542   ? PT Time Calculation (min) 43 min   ? Activity Tolerance Patient tolerated treatment well   ? Behavior During Therapy [facility] for tasks assessed/performed   ? ?  ?  ? ?  ? ? ?Past Medical History:  ?Diagnosis Date  ? -+   ? Stent placed; [place]  ? Arthritis   ? Atrial fibrillation ([place])   ? Cancer [facility])   ? testicular cancer  ? Cataract, left eye   ? Congestive heart failure (CHF) ([place])   ? DVT (deep venous thrombosis) ([place])   ? High cholesterol   ? Pneumonia   ? Psychosis ([place])   ? Pulmonary embolus ([place])   ? ? ?Past Surgical History:  ?Procedure Laterality Date  ? HERNIA REPAIR    ? IVC  2014  ? lamenectomy    ? left orchiectomy    ? TOTAL HIP ARTHROPLASTY Left 06/09/2021  ? Procedure: TOTAL HIP ARTHROPLASTY ANTERIOR APPROACH;  Surgeon: Dorna Leitz, MD;  Location: [facility];  Service: Orthopedics;  Laterality: Left;  ? ? ?There were no vitals filed for this visit. ? ? Subjective Assessment - 07/06/21 1011   ? ? Subjective Pt states the blood sac on his Lt lateral thigh is the main source of pain. this is preventing him from sleeping well and causing increased pain with ambulation   ? Patient Stated Goals be "back to normal" again-- matching the right leg   ? Currently in Pain? Yes   ? Pain Score 7    ? Pain Location Leg   ? Pain Orientation Left   ? Pain Descriptors / Indicators Cramping   ? Pain Type Acute pain   ? ?  ?  ? ?  ? ? ? ? ? [facility] PT Assessment - 07/06/21  0001   ? ?  ? Assessment  ? Medical Diagnosis total hip replacement   ? Referring Provider (PT) Dorna Leitz, MD   ? Onset Date/Surgical Date 06/09/21   ? Prior Therapy acute care   ?  ? Ambulation/Gait  ? Gait velocity 1.25 ft/sec   ? Stairs Yes   ? Stair Management Technique Step to pattern;Two rails   ? Number of Stairs 6   ? Height of Stairs 6   ? ?  ?  ? ?  ? ? ? ? ? ? ? ? ? ? ? ? ? ? ? ? [place] Adult PT Treatment/Exercise - 07/06/21 0001   ? ?  ? Knee/Hip Exercises: Aerobic  ? Nustep L5 x 5 min LEs only for warm up   ?  ? Knee/Hip Exercises: Standing  ? Hip Flexion 20 reps;Left   ? Hip Abduction 20 reps;Left   ? Hip Extension 20 reps;Left   ? Extension Limitations to neutral   ?  ? Knee/Hip Exercises: Supine  ? Quad Sets Strengthening;Left;5 reps   ? Quad Sets Limitations 5  sec hold   ? Bridges 10 reps   ? Straight Leg Raises Strengthening;Left;10 reps   ? Straight Leg Raises Limitations with quad set   ?  ? Knee/Hip Exercises: Sidelying  ? Clams 10 reps Lt LE   ?  ? Manual Therapy  ? Soft tissue mobilization STM over L lateral thigh and quad   ? Kinesiotex Create Space;Edema   basketweave pattern for tissue decompression L lateral thigh  ? ?  ?  ? ?  ? ? ? ? ? ? ? ? ? ? PT Education - 07/06/21 1049   ? ? Education Details scar massage   ? Person(s) Educated Patient   ? Methods Explanation;Demonstration   ? Comprehension Verbalized understanding   ? ?  ?  ? ?  ? ? ? PT Short Term Goals - 07/03/21 1310   ? ?  ? PT SHORT TERM GOAL #1  ? Title The patient will be indep with HEP for LE strength, flexibility and mobility.   ? Time 4   ? Period Weeks   ? Target Date 08/02/21   ?  ? PT SHORT TERM GOAL #2  ? Title The patient will report pain in L thigh/hip to be < or equal to 2/10.   ? Time 4   ? Period Weeks   ? Target Date 08/02/21   ?  ? PT SHORT TERM GOAL #3  ? Title The patient will be further assessed on stairs and will be mod indep with step to pattern for 12 steps.   ? Time 4   ? Period Weeks   ? Target Date  08/02/21   ?  ? PT SHORT TERM GOAL #4  ? Title The patient will ambulate x 200 feet nonstop without a standing rest break to demo improved muscle endurance and strength for functional tasks.   ? Time 4   ? Period Weeks   ? Target Date 08/02/21   ? ?  ?  ? ?  ? ? ? ? PT Long Term Goals - 07/03/21 1314   ? ?  ? PT LONG TERM GOAL #1  ? Title The patient will be indep with HEP progression.   ? Time 8   ? Period Weeks   ? Target Date 09/01/21   ?  ? PT LONG TERM GOAL #2  ? Title The patient will improve L hip strength to 4/5.   ? Time 8   ? Period Weeks   ? Target Date 09/01/21   ?  ? PT LONG TERM GOAL #3  ? Title The patient will negotiate 12 steps with reciprocal pattern mod indep with one rail.   ? Time 8   ? Period Weeks   ? Target Date 09/01/21   ?  ? PT LONG TERM GOAL #4  ? Title The patient will improve gait speed by 0.6 ft/sec to demo improving functional mobility and dec'd risk for falls.   ? Time 8   ? Period Weeks   ? Target Date 09/01/21   ?  ? PT LONG TERM GOAL #5  ? Title The patient will report no pain with ambulation >400 ft in the L hip.   ? Time 8   ? Period Weeks   ? Target Date 09/01/21   ? ?  ?  ? ?  ? ? ? ? ? ? ? ? Plan - 07/06/21 1055   ? ? Clinical Impression Statement Pt's main complaint is "blood sac"  on Lt lateral thigh. Re-applied K tape to reduce swelling. Gait speed tested and is below average, indicating increased risk of falls. Pt with good tolerance to supine and standing therex. Pt educated on scar massage and given handout   ? PT Next Visit Plan LE strengthening, gait and balance training, edema control   ? PT Home Exercise Plan [id number]   ? Consulted and Agree with Plan of Care Patient   ? ?  ?  ? ?  ? ? ?Patient will benefit from skilled therapeutic intervention in order to improve the following deficits and impairments:    ? ?Visit Diagnosis: ?Muscle weakness (generalized) ? ?Localized edema ? ?Other abnormalities of gait and mobility ? ?Pain in left hip ? ? ? ? ?Problem  List ?Patient Active Problem List  ? Diagnosis Date Noted  ? Primary osteoarthritis of left hip 06/09/2021  ? Anticoagulated on Coumadin 05/16/2021  ? Left hip pain 02/17/2021  ? Shingles 12/19/2020  ? Primary osteoarthritis of right knee 09/06/2020  ? H/O hernia repair 06/20/2020  ? Intertrigo 03/21/2020  ? Acute irritant rhinitis 02/10/2020  ? COVID-19 lung 01/20/2020  ? Thrombocytopenia ([place]) 08/12/2018  ? Liver lesion 08/12/2018  ? Lung nodule 08/12/2018  ? Presence of IVC filter 08/06/2018  ? Hypercalcemia due to a drug 07/30/2018  ? DVT (deep venous thrombosis) ([place]) 07/29/2018  ? Renal insufficiency 06/27/2018  ? Seborrheic keratosis 05/29/2017  ? Insomnia 04/05/2017  ? OSA (obstructive sleep apnea) 03/21/2017  ? Primary osteoarthritis of both shoulders 11/28/2016  ? Controlled type 2 diabetes mellitus without complication, without long-term current use of insulin ([place]) 06/08/2016  ? Seborrheic dermatitis 06/08/2016  ? Morbid obesity ([place]) 06/08/2016  ? Ruptured tympanic membrane, bilateral 01/27/2016  ? Annual physical exam 01/25/2016  ? Paroxysmal atrial fibrillation ([place]) 06/15/2015  ? Polycythemia, secondary 08/26/2014  ? Hemochromatosis 08/26/2014  ? Multiple pulmonary nodules 08/16/2014  ? Testicular cancer ([place]) 08/12/2014  ? Hyperlipidemia 08/10/2014  ? Hypogonadism in male 08/10/2014  ? Essential hypertension 08/10/2014  ? Spinal stenosis of lumbar region 08/10/2014  ? History of pulmonary embolus (PE) 08/10/2014  ? CAD (coronary artery disease), native coronary artery 08/10/2014  ? ? ?Erman Thum, PT ?07/06/2021, 10:57 AM ? ?[place] ?[place] ?[place], [place], [address] ?Phone: [phone]   Fax:  [phone] ? ?Name: Jeff Green ?MRN: [id number] ?Date of Birth: 05-18-1955 ? ? ? ?

## 2021-07-11 ENCOUNTER — Ambulatory Visit (INDEPENDENT_AMBULATORY_CARE_PROVIDER_SITE_OTHER): Payer: Medicare Other | Admitting: Sports Medicine

## 2021-07-11 ENCOUNTER — Ambulatory Visit (INDEPENDENT_AMBULATORY_CARE_PROVIDER_SITE_OTHER): Payer: Medicare Other

## 2021-07-11 ENCOUNTER — Telehealth: Payer: Self-pay | Admitting: Sports Medicine

## 2021-07-11 ENCOUNTER — Ambulatory Visit: Payer: Medicare Other | Admitting: Rehabilitative and Restorative Service Providers"

## 2021-07-11 ENCOUNTER — Other Ambulatory Visit: Payer: Self-pay

## 2021-07-11 DIAGNOSIS — M25552 Pain in left hip: Secondary | ICD-10-CM

## 2021-07-11 MED ORDER — DOXYCYCLINE HYCLATE 100 MG PO TABS
100.0000 mg | ORAL_TABLET | Freq: Two times a day (BID) | ORAL | 0 refills | Status: AC
Start: 2021-07-11 — End: 2021-07-18

## 2021-07-11 NOTE — Assessment & Plan Note (Signed)
This is a pleasant 66 year old male, known hip osteoarthritis, recently post hip arthroplasty with increasing pain down the lateral aspect of his left thigh, on exam he does have some fluctuance, it sounds like he had a postoperative hematoma. ?He still has significant pain, tender to palpation. ?On ultrasound he does have an area of subfascial edema just overlying the vastus lateralis mid thigh. ?This was injected. ?We will also add some doxycycline. ?Return to see me in 4 to 6 weeks for this. ?

## 2021-07-11 NOTE — Progress Notes (Signed)
? ? ?  Procedures performed today:   ? ?Procedure: Real-time Ultrasound Guided injection of the left vastus lateralis subfascial injection ?Device: Samsung HS60  ?Verbal informed consent obtained.  ?Time-out conducted.  ?Noted no overlying erythema, induration, or other signs of local infection.  ?Skin prepped in a sterile fashion.  ?Local anesthesia: Topical Ethyl chloride.  ?With sterile technique and under real time ultrasound guidance: Noted an area of subfascial edema overlying the vastus lateralis mid thigh, I advanced a 22-gauge spinal needle into this and injected 1 cc Kenalog 40, 2 cc lidocaine, 2 cc bupivacaine injected easily ?Completed without difficulty  ?Advised to call if fevers/chills, erythema, induration, drainage, or persistent bleeding.  ?Images permanently stored and available for review in PACS.  ?Impression: Technically successful ultrasound guided injection. ? ?Independent interpretation of notes and tests performed by another provider:  ? ?None. ? ?Brief History, Exam, Impression, and Recommendations:   ? ?Left hip pain ?This is a pleasant 66 year old male, known hip osteoarthritis, recently post hip arthroplasty with increasing pain down the lateral aspect of his left thigh, on exam he does have some fluctuance, it sounds like he had a postoperative hematoma. ?He still has significant pain, tender to palpation. ?On ultrasound he does have an area of subfascial edema just overlying the vastus lateralis mid thigh. ?This was injected. ?We will also add some doxycycline. ?Return to see me in 4 to 6 weeks for this. ? ? ? ?___________________________________________ ?Gwen Her. Dianah Field, M.D., ABFM., CAQSM. ?Primary Care and Sports Medicine ?[place] ? ?Adjunct Instructor of Family Medicine  ?[employer] ?

## 2021-07-11 NOTE — Telephone Encounter (Signed)
Known bilateral knee osteoarthritis, x-ray confirmed, has had multiple injections, analgesics, activity modification, lets work on [employer] or any other viscosupplement approval for both knees. ?

## 2021-07-11 NOTE — Telephone Encounter (Signed)
[employer] paperwork faxed to [employer] at [phone] ?Request is for Orthovisc or Monovisc ?Pt's insurance prefers Euflexxa ?Fax confirmation receipt received  ?

## 2021-07-12 NOTE — Telephone Encounter (Signed)
Benefits Investigation Details received from [employer] ?Injection: Synvisc ? ?Medical: Deductible does not apply. Once te OOP has been met, patient is covered at 100%. Prior authorization is required for the drug through [facility]. To initiiate, submit online at [email or web address] ?PA required: Yes ? ?Used [employer] to obtain ref # [id number] that no authorization was needed for Synvisc ? ?Pharmacy: The product is not covered under the pharmacy plan.  ? ?Specialty Pharmacy: [facility] ? ?May fill through: [facility] [place] ?OV Copay/Coinsurance: $30 ?Product Copay: 20% ?Administration Coinsurance: 20% ?Administration Copay:  ?Deductible:  ?Out of Pocket Max: $4500 (met: $427.62)   ? ?Message to Synvisc rep to send me a series of Synvisc for patient.  ? ?

## 2021-07-13 ENCOUNTER — Telehealth: Payer: Self-pay

## 2021-07-13 NOTE — Telephone Encounter (Signed)
Patient is requesting stronger pain meds. He reports the pain in his thigh is not getting any better and is keeping him up at night. He stated that he hasn't slept in 2 days.   ?

## 2021-07-13 NOTE — Telephone Encounter (Signed)
Patient recently has a hip replacement. Based on the OOP, it does not look like the insurance has been update after his procedure. Discussed with patient that he can opt to proceed with injections now at $230 per injection or wait a few weeks for his insurance to process his surgery claims and possibly have it covered at 100%. Patient opted to wait.  ?
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
## 2021-07-14 ENCOUNTER — Ambulatory Visit: Payer: Medicare Other | Admitting: Physical Therapy

## 2021-07-14 NOTE — Telephone Encounter (Signed)
Pt informed to contact Dr. Berenice Primas.  Pt expressed understanding and is agreeable.  Charyl Bigger, CMA ?

## 2021-07-18 ENCOUNTER — Other Ambulatory Visit: Payer: Self-pay

## 2021-07-18 ENCOUNTER — Ambulatory Visit (INDEPENDENT_AMBULATORY_CARE_PROVIDER_SITE_OTHER): Payer: Medicare Other | Admitting: Sports Medicine

## 2021-07-18 ENCOUNTER — Ambulatory Visit: Payer: Medicare Other | Admitting: Rehabilitative and Restorative Service Providers"

## 2021-07-18 VITALS — BP 114/72 | HR 68

## 2021-07-18 DIAGNOSIS — Z86711 Personal history of pulmonary embolism: Secondary | ICD-10-CM | POA: Diagnosis not present

## 2021-07-18 DIAGNOSIS — Z7901 Long term (current) use of anticoagulants: Secondary | ICD-10-CM | POA: Diagnosis not present

## 2021-07-18 DIAGNOSIS — M25552 Pain in left hip: Secondary | ICD-10-CM

## 2021-07-18 DIAGNOSIS — M6281 Muscle weakness (generalized): Secondary | ICD-10-CM | POA: Diagnosis not present

## 2021-07-18 DIAGNOSIS — R2689 Other abnormalities of gait and mobility: Secondary | ICD-10-CM

## 2021-07-18 DIAGNOSIS — I480 Paroxysmal atrial fibrillation: Secondary | ICD-10-CM | POA: Diagnosis not present

## 2021-07-18 DIAGNOSIS — R600 Localized edema: Secondary | ICD-10-CM | POA: Diagnosis not present

## 2021-07-18 LAB — POCT INR: INR: 1.8 — AB (ref 2.0–3.0)

## 2021-07-18 NOTE — Progress Notes (Signed)
Anticoagulated on Coumadin ?INR subtherapeutic. ?Continue current Coumadin dosing 7.5 mg tabs - one half tab on Wednesday and Saturday, 1 tab other days. ?Recheck nurse visit in 1 week. ? ? ?___________________________________________ ?Gwen Her. Dianah Field, M.D., ABFM., CAQSM. ?Primary Care and Sports Medicine ?[place] ? ?Adjunct Instructor of Family Medicine  ?[employer] ? ?

## 2021-07-18 NOTE — Assessment & Plan Note (Signed)
INR subtherapeutic. ?Continue current Coumadin dosing 7.5 mg tabs - one half tab on Wednesday and Saturday, 1 tab other days. ?Recheck nurse visit in 1 week. ?

## 2021-07-18 NOTE — Therapy (Signed)
[facility] ?[facility] ?[address] ?[place], [place], [address] ?Phone: [phone]   Fax:  [phone] ? ?Physical Therapy Treatment ? ?Patient Details  ?Name: Jeff Green ?MRN: [id number] ?Date of Birth: September 29, 1955 ?Referring Provider (PT): Dorna Leitz, MD ? ? ?Encounter Date: 07/18/2021 ? ? PT End of Session - 07/18/21 1124   ? ? Visit Number 3   ? Number of Visits 16   ? Date for PT Re-Evaluation 09/01/21   ? Authorization Type UHC medicare   ? PT Start Time 1058   ? PT Stop Time 9323   ? PT Time Calculation (min) 40 min   ? Activity Tolerance Patient tolerated treatment well   ? Behavior During Therapy [facility] for tasks assessed/performed   ? ?  ?  ? ?  ? ? ?Past Medical History:  ?Diagnosis Date  ? -+   ? Stent placed; [place]  ? Arthritis   ? Atrial fibrillation ([place])   ? Cancer [facility])   ? testicular cancer  ? Cataract, left eye   ? Congestive heart failure (CHF) ([place])   ? DVT (deep venous thrombosis) ([place])   ? High cholesterol   ? Pneumonia   ? Psychosis ([place])   ? Pulmonary embolus ([place])   ? ? ?Past Surgical History:  ?Procedure Laterality Date  ? HERNIA REPAIR    ? IVC  2014  ? lamenectomy    ? left orchiectomy    ? TOTAL HIP ARTHROPLASTY Left 06/09/2021  ? Procedure: TOTAL HIP ARTHROPLASTY ANTERIOR APPROACH;  Surgeon: Dorna Leitz, MD;  Location: [facility];  Service: Orthopedics;  Laterality: Left;  ? ? ?There were no vitals filed for this visit. ? ? Subjective Assessment - 07/18/21 1115   ? ? Subjective The patient has numbness in L lateral thigh, and notes improved pain after recent injrection.   ? Pertinent History h/o Covid, shingles, a-fib, PE, HTN, IVC filter, h/o DVT, h/o back surgery (R foot drop)   ? Patient Stated Goals be "back to normal" again-- matching the right leg   ? Currently in Pain? Yes   ? Pain Score 4    ? Pain Location Leg   ? Pain Orientation Left   ? Pain Descriptors / Indicators Sore;Discomfort   ? Pain Type Acute pain   ?  Pain Onset More than a month ago   ? Pain Frequency Constant   ? Aggravating Factors  unsure   ? Pain Relieving Factors movement   ? ?  ?  ? ?  ? ? ? ? ? ? ? ? ? ? ? ? ? ? ? ? ? ? ? ? [place] Adult PT Treatment/Exercise - 07/18/21 1056   ? ?  ? Ambulation/Gait  ? Ambulation/Gait Yes   ? Ambulation/Gait Assistance 6: Modified independent (Device/Increase time)   no device, slowed pace  ? Ambulation Distance (Feet) 60 Feet   ? Assistive device None;Straight cane   ? Gait Comments Gait activities with [place] working on stride length, equal weight shift and hip stability; backwards walking x 10 feet, marching anteriorly x 10 feet x 4 reps   ?  ? Exercises  ? Exercises Knee/Hip;Other Exercises   ? Other Exercises  Ankle Red band x 10 reps PF   ?  ? Knee/Hip Exercises: Stretches  ? Passive Hamstring Stretch Left;2 reps;30 seconds   ? Quad Stretch Left;2 reps;30 seconds   ? Other Knee/Hip Stretches inner thigh stretch/ butterfly stretch   ?  ?  Knee/Hip Exercises: Aerobic  ? Nustep L5 x 5 minutes bilat LEs   ?  ? Knee/Hip Exercises: Supine  ? Bridges 10 reps   ? Other Supine Knee/Hip Exercises supine marching x 10 reps with cues on foot position   ?  ? Knee/Hip Exercises: Sidelying  ? Hip ABduction Strengthening;Left;10 reps   ?  ? Knee/Hip Exercises: Prone  ? Hamstring Curl 10 reps   ? Hip Extension Strengthening;Left;5 reps   ? Other [place] Exercises pronequad set x 5 reps   ?  ? Manual Therapy  ? Manual Therapy Soft tissue mobilization   ? Soft tissue mobilization STM L lateral thigh and quad/ HS with muscle roller   ? ?  ?  ? ?  ? ? ? ? ? ? ? ? ? ? ? ? PT Short Term Goals - 07/03/21 1310   ? ?  ? PT SHORT TERM GOAL #1  ? Title The patient will be indep with HEP for LE strength, flexibility and mobility.   ? Time 4   ? Period Weeks   ? Target Date 08/02/21   ?  ? PT SHORT TERM GOAL #2  ? Title The patient will report pain in L thigh/hip to be < or equal to 2/10.   ? Time 4   ? Period Weeks   ? Target Date 08/02/21   ?  ? PT  SHORT TERM GOAL #3  ? Title The patient will be further assessed on stairs and will be mod indep with step to pattern for 12 steps.   ? Time 4   ? Period Weeks   ? Target Date 08/02/21   ?  ? PT SHORT TERM GOAL #4  ? Title The patient will ambulate x 200 feet nonstop without a standing rest break to demo improved muscle endurance and strength for functional tasks.   ? Time 4   ? Period Weeks   ? Target Date 08/02/21   ? ?  ?  ? ?  ? ? ? ? PT Long Term Goals - 07/03/21 1314   ? ?  ? PT LONG TERM GOAL #1  ? Title The patient will be indep with HEP progression.   ? Time 8   ? Period Weeks   ? Target Date 09/01/21   ?  ? PT LONG TERM GOAL #2  ? Title The patient will improve L hip strength to 4/5.   ? Time 8   ? Period Weeks   ? Target Date 09/01/21   ?  ? PT LONG TERM GOAL #3  ? Title The patient will negotiate 12 steps with reciprocal pattern mod indep with one rail.   ? Time 8   ? Period Weeks   ? Target Date 09/01/21   ?  ? PT LONG TERM GOAL #4  ? Title The patient will improve gait speed by 0.6 ft/sec to demo improving functional mobility and dec'd risk for falls.   ? Time 8   ? Period Weeks   ? Target Date 09/01/21   ?  ? PT LONG TERM GOAL #5  ? Title The patient will report no pain with ambulation >400 ft in the L hip.   ? Time 8   ? Period Weeks   ? Target Date 09/01/21   ? ?  ?  ? ?  ? ? ? ? ? ? ? ? Plan - 07/18/21 1145   ? ? Clinical Impression Statement The patient arrived today  without his cane.  PT emphasized need for continued use to reduce risk of falls due to R foot drop and he also has worsened gait mechanics without device.  PT progressing ther ex to patient tolerance and working on gait and soft tissue mobility.  Plan to continue working to STGs/LTGs.   ? PT Treatment/Interventions ADLs/Self Care Home Management;Gait training;Stair training;Functional mobility training;Therapeutic activities;Therapeutic exercise;Balance training;Neuromuscular re-education;DME Instruction;Cryotherapy;Electrical  Stimulation;Moist Heat;Manual techniques;Passive range of motion;Patient/family education;Taping   ? PT Next Visit Plan LE strengthening, gait and balance training, edema control   ? PT Home Exercise Plan [id number]   ? Consulted and Agree with Plan of Care Patient   ? ?  ?  ? ?  ? ? ?Patient will benefit from skilled therapeutic intervention in order to improve the following deficits and impairments:  Abnormal gait, Decreased range of motion, Increased fascial restricitons, Decreased strength, Decreased balance, Decreased activity tolerance, Impaired tone, Decreased endurance, Pain, Impaired flexibility, Increased edema ? ?Visit Diagnosis: ?Muscle weakness (generalized) ? ?Localized edema ? ?Other abnormalities of gait and mobility ? ?Pain in left hip ? ? ? ? ?Problem List ?Patient Active Problem List  ? Diagnosis Date Noted  ? Primary osteoarthritis of left hip 06/09/2021  ? Anticoagulated on Coumadin 05/16/2021  ? Left hip pain 02/17/2021  ? Shingles 12/19/2020  ? Primary osteoarthritis of right knee 09/06/2020  ? H/O hernia repair 06/20/2020  ? Intertrigo 03/21/2020  ? Acute irritant rhinitis 02/10/2020  ? COVID-19 lung 01/20/2020  ? Thrombocytopenia ([place]) 08/12/2018  ? Liver lesion 08/12/2018  ? Lung nodule 08/12/2018  ? Presence of IVC filter 08/06/2018  ? Hypercalcemia due to a drug 07/30/2018  ? DVT (deep venous thrombosis) ([place]) 07/29/2018  ? Renal insufficiency 06/27/2018  ? Seborrheic keratosis 05/29/2017  ? Insomnia 04/05/2017  ? OSA (obstructive sleep apnea) 03/21/2017  ? Primary osteoarthritis of both shoulders 11/28/2016  ? Controlled type 2 diabetes mellitus without complication, without long-term current use of insulin ([place]) 06/08/2016  ? Seborrheic dermatitis 06/08/2016  ? Morbid obesity ([place]) 06/08/2016  ? Ruptured tympanic membrane, bilateral 01/27/2016  ? Annual physical exam 01/25/2016  ? Paroxysmal atrial fibrillation ([place]) 06/15/2015  ? Polycythemia, secondary 08/26/2014  ? Hemochromatosis  08/26/2014  ? Multiple pulmonary nodules 08/16/2014  ? Testicular cancer ([place]) 08/12/2014  ? Hyperlipidemia 08/10/2014  ? Hypogonadism in male 08/10/2014  ? Essential hypertension 08/10/2014  ? Spinal stenosis of lu

## 2021-07-19 NOTE — Progress Notes (Signed)
Patient advised.

## 2021-07-20 ENCOUNTER — Ambulatory Visit: Payer: Medicare Other | Admitting: Physical Therapy

## 2021-07-24 ENCOUNTER — Ambulatory Visit: Payer: Medicare Other

## 2021-07-25 DIAGNOSIS — Z9889 Other specified postprocedural states: Secondary | ICD-10-CM | POA: Diagnosis not present

## 2021-07-25 DIAGNOSIS — M1612 Unilateral primary osteoarthritis, left hip: Secondary | ICD-10-CM | POA: Diagnosis not present

## 2021-07-25 DIAGNOSIS — M7632 Iliotibial band syndrome, left leg: Secondary | ICD-10-CM | POA: Diagnosis not present

## 2021-07-27 ENCOUNTER — Ambulatory Visit: Payer: Medicare Other | Attending: Orthopedic Surgery | Admitting: Physical Therapy

## 2021-07-27 DIAGNOSIS — M25552 Pain in left hip: Secondary | ICD-10-CM | POA: Insufficient documentation

## 2021-07-27 DIAGNOSIS — R600 Localized edema: Secondary | ICD-10-CM | POA: Diagnosis not present

## 2021-07-27 DIAGNOSIS — R2689 Other abnormalities of gait and mobility: Secondary | ICD-10-CM | POA: Insufficient documentation

## 2021-07-27 DIAGNOSIS — M6281 Muscle weakness (generalized): Secondary | ICD-10-CM | POA: Diagnosis not present

## 2021-07-27 NOTE — Therapy (Signed)
[facility] ?[place], [place], [address] ?Phone: [phone]   Fax:  [phone] ? ?Physical Therapy Treatment ? ?Patient Details  ?Name: Jeff Green ?MRN: [id number] ?Date of Birth: Jun 03, 1955 ?Referring Provider (PT): Dorna Leitz, MD ? ? ?Encounter Date: 07/27/2021 ? ? PT End of Session - 07/27/21 0929   ? ? Visit Number 4   ? Number of Visits 16   ? Date for PT Re-Evaluation 09/01/21   ? Authorization Type UHC medicare   ? PT Start Time 0930   ? PT Stop Time 1010   ? PT Time Calculation (min) 40 min   ? Activity Tolerance Patient tolerated treatment well   ? Behavior During Therapy [facility] for tasks assessed/performed   ? ?  ?  ? ?  ? ? ?Past Medical History:  ?Diagnosis Date  ? -+   ? Stent placed; [place]  ? Arthritis   ? Atrial fibrillation ([place])   ? Cancer [facility])   ? testicular cancer  ? Cataract, left eye   ? Congestive heart failure (CHF) ([place])   ? DVT (deep venous thrombosis) ([place])   ? High cholesterol   ? Pneumonia   ? Psychosis ([place])   ? Pulmonary embolus ([place])   ? ? ?Past Surgical History:  ?Procedure Laterality Date  ? HERNIA REPAIR    ? IVC  2014  ? lamenectomy    ? left orchiectomy    ? TOTAL HIP ARTHROPLASTY Left 06/09/2021  ? Procedure: TOTAL HIP ARTHROPLASTY ANTERIOR APPROACH;  Surgeon: Dorna Leitz, MD;  Location: [facility];  Service: Orthopedics;  Laterality: Left;  ? ? ?There were no vitals filed for this visit. ? ? Subjective Assessment - 07/27/21 0931   ? ? Subjective Pt states he saw PA and injected his ITB. Pt reports this improved his pain and his sleep. Pt reports some improved sensation in his L lateral thigh. Pt states he is not using his cane around the house. Pt is trying to wean himself off his meds. Pt reports his leg was sore after getting the roller on his ITB.   ? Pertinent History h/o Covid, shingles, a-fib, PE, HTN, IVC filter, h/o DVT, h/o back surgery (R foot drop)   ? Patient Stated Goals be "back  to normal" again-- matching the right leg   ? Currently in Pain? Yes   ? Pain Score 2    ? Pain Location Leg   ? Pain Orientation Left   ? Pain Onset More than a month ago   ? ?  ?  ? ?  ? ? ? ? ? ? ? ? ? ? ? ? ? ? ? ? ? ? ? ? [place] Adult PT Treatment/Exercise - 07/27/21 0001   ? ?  ? Ambulation/Gait  ? Ambulation Distance (Feet) 80 Feet   ? Assistive device None   ? Gait Pattern Step-through pattern;Trendelenburg;Lateral hip instability   "slap" foot R>L  ? Ambulation Surface Level;Indoor   ? Gait Comments Discussed importance of keeping hips and trunk from swaying to reduce irritation of ITB   ?  ? Knee/Hip Exercises: Stretches  ? ITB Stretch Left;30 seconds;2 reps   ? Other Knee/Hip Stretches TFL stretch sidelying 2x30 sec   ?  ? Knee/Hip Exercises: Aerobic  ? Nustep L5 x 5 minutes bilat LEs   ?  ? Knee/Hip Exercises: Standing  ? Hip Flexion 20 reps;Left   ? Hip Flexion Limitations red tband   ?  Hip Abduction 20 reps;Left   ? Abduction Limitations red tband   ? Hip Extension 20 reps;Left   ? Extension Limitations red tband   ? Other Standing Knee Exercises mini squat 2x10   ?  ? Knee/Hip Exercises: Seated  ? Other Seated Knee/Hip Exercises red tband PF 2x10   ?  ? Knee/Hip Exercises: Supine  ? Bridges Strengthening;Both;5 reps   ? ?  ?  ? ?  ? ? ? ? ? ? ? ? ? ? ? ? PT Short Term Goals - 07/03/21 1310   ? ?  ? PT SHORT TERM GOAL #1  ? Title The patient will be indep with HEP for LE strength, flexibility and mobility.   ? Time 4   ? Period Weeks   ? Target Date 08/02/21   ?  ? PT SHORT TERM GOAL #2  ? Title The patient will report pain in L thigh/hip to be < or equal to 2/10.   ? Time 4   ? Period Weeks   ? Target Date 08/02/21   ?  ? PT SHORT TERM GOAL #3  ? Title The patient will be further assessed on stairs and will be mod indep with step to pattern for 12 steps.   ? Time 4   ? Period Weeks   ? Target Date 08/02/21   ?  ? PT SHORT TERM GOAL #4  ? Title The patient will ambulate x 200 feet nonstop without a  standing rest break to demo improved muscle endurance and strength for functional tasks.   ? Time 4   ? Period Weeks   ? Target Date 08/02/21   ? ?  ?  ? ?  ? ? ? ? PT Long Term Goals - 07/03/21 1314   ? ?  ? PT LONG TERM GOAL #1  ? Title The patient will be indep with HEP progression.   ? Time 8   ? Period Weeks   ? Target Date 09/01/21   ?  ? PT LONG TERM GOAL #2  ? Title The patient will improve L hip strength to 4/5.   ? Time 8   ? Period Weeks   ? Target Date 09/01/21   ?  ? PT LONG TERM GOAL #3  ? Title The patient will negotiate 12 steps with reciprocal pattern mod indep with one rail.   ? Time 8   ? Period Weeks   ? Target Date 09/01/21   ?  ? PT LONG TERM GOAL #4  ? Title The patient will improve gait speed by 0.6 ft/sec to demo improving functional mobility and dec'd risk for falls.   ? Time 8   ? Period Weeks   ? Target Date 09/01/21   ?  ? PT LONG TERM GOAL #5  ? Title The patient will report no pain with ambulation >400 ft in the L hip.   ? Time 8   ? Period Weeks   ? Target Date 09/01/21   ? ?  ?  ? ?  ? ? ? ? ? ? ? ? Plan - 07/27/21 1016   ? ? Clinical Impression Statement Pt with improved L LE pain s/p injection. Arrives with no cane once again. Reinforced importance of maintaining stable hip/pelvis/trunk with gait to reduce recurrence of his IT Band symptoms. Went through ITB stretches this session. Progressed his strengthening with resistance.   ? Personal Factors and Comorbidities Comorbidity 3+   ? Comorbidities h/o back  surgery, afib, dvt and PE   ? Examination-Activity Limitations Bed Mobility;Locomotion Level;Sit;Sleep;Squat;Stairs;Stand;Carry;Lift   ? PT Treatment/Interventions ADLs/Self Care Home Management;Gait training;Stair training;Functional mobility training;Therapeutic activities;Therapeutic exercise;Balance training;Neuromuscular re-education;DME Instruction;Cryotherapy;Electrical Stimulation;Moist Heat;Manual techniques;Passive range of motion;Patient/family education;Taping   ? PT  Next Visit Plan LE strengthening, gait and balance training, edema control   ? PT Home Exercise Plan [id number]   ? Consulted and Agree with Plan of Care Patient   ? ?  ?  ? ?  ? ? ?Patient will benefit from skilled therapeutic intervention in order to improve the following deficits and impairments:  Abnormal gait, Decreased range of motion, Increased fascial restricitons, Decreased strength, Decreased balance, Decreased activity tolerance, Impaired tone, Decreased endurance, Pain, Impaired flexibility, Increased edema ? ?Visit Diagnosis: ?Muscle weakness (generalized) ? ?Localized edema ? ?Other abnormalities of gait and mobility ? ?Pain in left hip ? ? ? ? ?Problem List ?Patient Active Problem List  ? Diagnosis Date Noted  ? Primary osteoarthritis of left hip 06/09/2021  ? Anticoagulated on Coumadin 05/16/2021  ? Left hip pain 02/17/2021  ? Shingles 12/19/2020  ? Primary osteoarthritis of right knee 09/06/2020  ? H/O hernia repair 06/20/2020  ? Intertrigo 03/21/2020  ? Acute irritant rhinitis 02/10/2020  ? COVID-19 lung 01/20/2020  ? Thrombocytopenia ([place]) 08/12/2018  ? Liver lesion 08/12/2018  ? Lung nodule 08/12/2018  ? Presence of IVC filter 08/06/2018  ? Hypercalcemia due to a drug 07/30/2018  ? DVT (deep venous thrombosis) ([place]) 07/29/2018  ? Renal insufficiency 06/27/2018  ? Seborrheic keratosis 05/29/2017  ? Insomnia 04/05/2017  ? OSA (obstructive sleep apnea) 03/21/2017  ? Primary osteoarthritis of both shoulders 11/28/2016  ? Controlled type 2 diabetes mellitus without complication, without long-term current use of insulin ([place]) 06/08/2016  ? Seborrheic dermatitis 06/08/2016  ? Morbid obesity ([place]) 06/08/2016  ? Ruptured tympanic membrane, bilateral 01/27/2016  ? Annual physical exam 01/25/2016  ? Paroxysmal atrial fibrillation ([place]) 06/15/2015  ? Polycythemia, secondary 08/26/2014  ? Hemochromatosis 08/26/2014  ? Multiple pulmonary nodules 08/16/2014  ? Testicular cancer ([place]) 08/12/2014  ? Hyperlipidemia  08/10/2014  ? Hypogonadism in male 08/10/2014  ? Essential hypertension 08/10/2014  ? Spinal stenosis of lumbar region 08/10/2014  ? History of pulmonary embolus (PE) 08/10/2014  ? CAD (coronary artery di

## 2021-07-31 ENCOUNTER — Ambulatory Visit (INDEPENDENT_AMBULATORY_CARE_PROVIDER_SITE_OTHER): Payer: Medicare Other | Admitting: Sports Medicine

## 2021-07-31 ENCOUNTER — Ambulatory Visit (INDEPENDENT_AMBULATORY_CARE_PROVIDER_SITE_OTHER): Payer: Medicare Other

## 2021-07-31 DIAGNOSIS — I480 Paroxysmal atrial fibrillation: Secondary | ICD-10-CM

## 2021-07-31 DIAGNOSIS — Z7901 Long term (current) use of anticoagulants: Secondary | ICD-10-CM | POA: Diagnosis not present

## 2021-07-31 DIAGNOSIS — M19011 Primary osteoarthritis, right shoulder: Secondary | ICD-10-CM | POA: Diagnosis not present

## 2021-07-31 DIAGNOSIS — M19012 Primary osteoarthritis, left shoulder: Secondary | ICD-10-CM

## 2021-07-31 DIAGNOSIS — Z86711 Personal history of pulmonary embolism: Secondary | ICD-10-CM

## 2021-07-31 LAB — POCT INR: INR: 2.2 (ref 2.0–3.0)

## 2021-07-31 NOTE — Assessment & Plan Note (Signed)
Jeff Green does have bilateral glenohumeral osteoarthritis, last injections were done late August on the right and none late November on the left. ?Repeat bilateral glenohumeral injections today with ultrasound guidance. ?

## 2021-07-31 NOTE — Progress Notes (Addendum)
? ? ?  Procedures performed today:   ? ?Procedure: Real-time Ultrasound Guided injection of the left glenohumeral joint ?Device: Samsung HS60  ?Verbal informed consent obtained.  ?Time-out conducted.  ?Noted no overlying erythema, induration, or other signs of local infection.  ?Skin prepped in a sterile fashion.  ?Local anesthesia: Topical Ethyl chloride.  ?With sterile technique and under real time ultrasound guidance: Noted significant glenohumeral osteoarthritis, 1 cc Kenalog 40, 2 cc lidocaine, 2 cc bupivacaine injected easily ?Completed without difficulty  ?Advised to call if fevers/chills, erythema, induration, drainage, or persistent bleeding.  ?Images permanently stored and available for review in PACS.  ?Impression: Technically successful ultrasound guided injection. ? ?Procedure: Real-time Ultrasound Guided injection of the right glenohumeral joint ?Device: Samsung HS60  ?Verbal informed consent obtained.  ?Time-out conducted.  ?Noted no overlying erythema, induration, or other signs of local infection.  ?Skin prepped in a sterile fashion.  ?Local anesthesia: Topical Ethyl chloride.  ?With sterile technique and under real time ultrasound guidance: Noted significant glenohumeral osteoarthritis, 1 cc Kenalog 40, 2 cc lidocaine, 2 cc bupivacaine injected easily ?Completed without difficulty  ?Advised to call if fevers/chills, erythema, induration, drainage, or persistent bleeding.  ?Images permanently stored and available for review in PACS.  ?Impression: Technically successful ultrasound guided injection. ? ?Independent interpretation of notes and tests performed by another provider:  ? ?None. ? ?Brief History, Exam, Impression, and Recommendations:   ? ?Primary osteoarthritis of both shoulders ?Jeff Green does have bilateral glenohumeral osteoarthritis, last injections were done late August on the right and none late November on the left. ?Repeat bilateral glenohumeral injections today with ultrasound  guidance. ? ?Anticoagulated on Coumadin ?INR up to 2.2 from 1.8, continue current Coumadin dosing 7.5 mg tabs, one half tab on Wednesday and Saturday, 1 tab other days. ?Revisit in 4 weeks, his target for hematology/oncology was 2.5-3.5 but patient is relatively adamant that we leave it at 2.2, he gets bleeding between 2.5 and 3.5 so I think this is a good reason to leave it where it's at. ? ? ? ?___________________________________________ ?Gwen Her. Dianah Field, M.D., ABFM., CAQSM. ?Primary Care and Sports Medicine ?[place] ? ?Adjunct Instructor of Family Medicine  ?[employer] ?

## 2021-07-31 NOTE — Assessment & Plan Note (Signed)
INR up to 2.2 from 1.8, continue current Coumadin dosing 7.5 mg tabs, one half tab on Wednesday and Saturday, 1 tab other days. ?Revisit in 4 weeks, his target for hematology/oncology was 2.5-3.5 but patient is relatively adamant that we leave it at 2.2, he gets bleeding between 2.5 and 3.5 so I think this is a good reason to leave it where it's at. ?

## 2021-08-01 ENCOUNTER — Ambulatory Visit: Payer: Medicare Other | Admitting: Physical Therapy

## 2021-08-02 ENCOUNTER — Other Ambulatory Visit: Payer: Self-pay | Admitting: Sports Medicine

## 2021-08-03 ENCOUNTER — Encounter: Payer: Medicare Other | Admitting: Physical Therapy

## 2021-08-08 ENCOUNTER — Ambulatory Visit: Payer: Medicare Other | Admitting: Rehabilitative and Restorative Service Providers"

## 2021-08-10 ENCOUNTER — Ambulatory Visit: Payer: Medicare Other | Admitting: Physical Therapy

## 2021-08-10 ENCOUNTER — Encounter: Payer: Self-pay | Admitting: Physical Therapy

## 2021-08-10 DIAGNOSIS — M25552 Pain in left hip: Secondary | ICD-10-CM | POA: Diagnosis not present

## 2021-08-10 DIAGNOSIS — M6281 Muscle weakness (generalized): Secondary | ICD-10-CM

## 2021-08-10 DIAGNOSIS — R600 Localized edema: Secondary | ICD-10-CM | POA: Diagnosis not present

## 2021-08-10 DIAGNOSIS — R2689 Other abnormalities of gait and mobility: Secondary | ICD-10-CM

## 2021-08-10 NOTE — Therapy (Addendum)
[place], [place], [address] Phone: [phone]   Fax:  [phone]  Physical Therapy Treatment AND Discharge Summary  Patient Details  Name: Jeff Green MRN: [id number] Date of Birth: 03/20/1956 Referring Provider (PT): Dorna Leitz, MD   Encounter Date: 08/10/2021   PT End of Session - 08/10/21 1018     Visit Number 5    Number of Visits 16    Date for PT Re-Evaluation 09/01/21    Authorization Type UHC medicare    PT Start Time 1017    PT Stop Time 1102    PT Time Calculation (min) 45 min    Activity Tolerance Patient tolerated treatment well    Behavior During Therapy [facility] for tasks assessed/performed             Past Medical History:  Diagnosis Date   -+    Stent placed; [place]   Arthritis    Atrial fibrillation ([place])    Cancer [facility])    testicular cancer   Cataract, left eye    Congestive heart failure (CHF) ([facility])    DVT (deep venous thrombosis) ([facility])    High cholesterol    Pneumonia    Psychosis ([place])    Pulmonary embolus ([place])     Past Surgical History:  Procedure Laterality Date   HERNIA REPAIR     IVC  2014   lamenectomy     left orchiectomy     TOTAL HIP ARTHROPLASTY Left 06/09/2021   Procedure: TOTAL HIP ARTHROPLASTY ANTERIOR APPROACH;  Surgeon: Dorna Leitz, MD;  Location: [facility];  Service: Orthopedics;  Laterality: Left;    There were no vitals filed for this visit.   Subjective Assessment - 08/10/21 1019     Subjective Still feeling pain in the left ITB    Pertinent History h/o Covid, shingles, a-fib, PE, HTN, IVC filter, h/o DVT, h/o back surgery (R foot drop)    Patient Stated Goals be "back to normal" again-- matching the right leg    Currently in Pain? Yes    Pain Score 3     Pain Location Leg    Pain Orientation Left    Pain Descriptors / Indicators Sore    Pain Type Acute pain                                [facility] Adult PT Treatment/Exercise - 08/10/21 0001       Knee/Hip Exercises: Stretches   Other Knee/Hip Stretches TFL stretch sidelying 2x30 sec    Other Knee/Hip Stretches seated SB stretch for left QL x 30 sec; also did lateral glide stretch/lumbar x 5 each way x 5 sec      Knee/Hip Exercises: Aerobic   Nustep L5 x 5 minutes bilat LEs      Knee/Hip Exercises: Standing   Hip Flexion Both;20 reps;Knee straight    Hip Flexion Limitations red tband; pain in left thigh today when doing right side; did 10 with knee bent on left with RTB also    Hip Abduction 20 reps;Both    Abduction Limitations red tband    Hip Extension 20 reps;Both    Extension Limitations red tband    Other Standing Knee Exercises mini squat 2x10 to chair      Knee/Hip Exercises: Seated   Other Seated Knee/Hip Exercises seated RTB x 10; long sitting too hard with yellow; no  resistance x 5      Knee/Hip Exercises: Supine   Bridges Strengthening;Both;10 reps      Manual Therapy   Manual Therapy Soft tissue mobilization;Myofascial release    Soft tissue mobilization IASTM to left quads and ITB    Myofascial Release psoas release in hooklying                     PT Education - 08/10/21 1312     Education Details seated SB for QL stretch    Person(s) Educated Patient    Methods Explanation;Demonstration    Comprehension Verbalized understanding;Returned demonstration              PT Short Term Goals - 08/10/21 1313       PT SHORT TERM GOAL #1   Title The patient will be indep with HEP for LE strength, flexibility and mobility.    Status Achieved      PT SHORT TERM GOAL #2   Title The patient will report pain in L thigh/hip to be < or equal to 2/10.    Status On-going               PT Long Term Goals - 07/03/21 1314       PT LONG TERM GOAL #1   Title The patient will be indep with HEP progression.    Time 8    Period Weeks    Target Date  09/01/21      PT LONG TERM GOAL #2   Title The patient will improve L hip strength to 4/5.    Time 8    Period Weeks    Target Date 09/01/21      PT LONG TERM GOAL #3   Title The patient will negotiate 12 steps with reciprocal pattern mod indep with one rail.    Time 8    Period Weeks    Target Date 09/01/21      PT LONG TERM GOAL #4   Title The patient will improve gait speed by 0.6 ft/sec to demo improving functional mobility and dec'd risk for falls.    Time 8    Period Weeks    Target Date 09/01/21      PT LONG TERM GOAL #5   Title The patient will report no pain with ambulation >400 ft in the L hip.    Time 8    Period Weeks    Target Date 09/01/21                   Plan - 08/10/21 1309     Clinical Impression Statement Rich amb with no AD and experienced two LOB incidences while he was here. Still complaining of Left lateral thigh pain. He responded well to IASTM with Edge tool. He has tightness in his left QL and hip flexor which responded well to MFR. He requires cues to slow down when doing his exercises. Remaining STGs should be assessed next visit.    PT Frequency 2x / week    PT Duration 8 weeks    PT Treatment/Interventions ADLs/Self Care Home Management;Gait training;Stair training;Functional mobility training;Therapeutic activities;Therapeutic exercise;Balance training;Neuromuscular re-education;DME Instruction;Cryotherapy;Electrical Stimulation;Moist Heat;Manual techniques;Passive range of motion;Patient/family education;Taping    PT Next Visit Plan Asses STGs, LE strengthening, gait and balance training, edema control    PT Home Exercise Plan [id number]    Consulted and Agree with Plan of Care Patient  Patient will benefit from skilled therapeutic intervention in order to improve the following deficits and impairments:  Abnormal gait, Decreased range of motion, Increased fascial restricitons, Decreased strength, Decreased balance,  Decreased activity tolerance, Impaired tone, Decreased endurance, Pain, Impaired flexibility, Increased edema  Visit Diagnosis: Muscle weakness (generalized)  Localized edema  Other abnormalities of gait and mobility  Pain in left hip     Problem List Patient Active Problem List   Diagnosis Date Noted   Primary osteoarthritis of left hip 06/09/2021   Anticoagulated on Coumadin 05/16/2021   Left hip pain 02/17/2021   Shingles 12/19/2020   Primary osteoarthritis of right knee 09/06/2020   H/O hernia repair 06/20/2020   Intertrigo 03/21/2020   Acute irritant rhinitis 02/10/2020   COVID-19 lung 01/20/2020   Thrombocytopenia ([place]) 08/12/2018   Liver lesion 08/12/2018   Lung nodule 08/12/2018   Presence of IVC filter 08/06/2018   Hypercalcemia due to a drug 07/30/2018   DVT (deep venous thrombosis) ([place]) 07/29/2018   Renal insufficiency 06/27/2018   Seborrheic keratosis 05/29/2017   Insomnia 04/05/2017   OSA (obstructive sleep apnea) 03/21/2017   Primary osteoarthritis of both shoulders 11/28/2016   Controlled type 2 diabetes mellitus without complication, without long-term current use of insulin ([place]) 06/08/2016   Seborrheic dermatitis 06/08/2016   Morbid obesity ([place]) 06/08/2016   Ruptured tympanic membrane, bilateral 01/27/2016   Annual physical exam 01/25/2016   Paroxysmal atrial fibrillation ([place]) 06/15/2015   Polycythemia, secondary 08/26/2014   Hemochromatosis 08/26/2014   Multiple pulmonary nodules 08/16/2014   Testicular cancer ([place]) 08/12/2014   Hyperlipidemia 08/10/2014   Hypogonadism in male 08/10/2014   Essential hypertension 08/10/2014   Spinal stenosis of lumbar region 08/10/2014   History of pulmonary embolus (PE) 08/10/2014   CAD (coronary artery disease), native coronary artery 08/10/2014    Madelyn Flavors, PT 08/10/2021, 1:16 PM  [facility] [place] [address] [place], [place], [address] Phone:  [phone]   Fax:  [phone]  Name: Tykwon Fera MRN: [id number] Date of Birth: Mar 12, 1956   PHYSICAL THERAPY DISCHARGE SUMMARY  Visits from Start of Care: 5  Current functional level related to goals / functional outcomes: unknown   Remaining deficits: unknown   Education / Equipment: HEP   Patient agrees to discharge. Patient goals were not met. Patient is being discharged due to not returning since the last visit.  Madelyn Flavors, PT 12/26/21 9:25 AM  [facility] [place], [place] [address]  [phone] (office) [phone] (fax)

## 2021-08-15 ENCOUNTER — Ambulatory Visit: Payer: Medicare Other | Admitting: Physical Therapy

## 2021-08-15 ENCOUNTER — Other Ambulatory Visit: Payer: Self-pay | Admitting: Sports Medicine

## 2021-08-15 DIAGNOSIS — E785 Hyperlipidemia, unspecified: Secondary | ICD-10-CM

## 2021-08-17 ENCOUNTER — Ambulatory Visit: Payer: Medicare Other | Admitting: Physical Therapy

## 2021-08-22 ENCOUNTER — Ambulatory Visit: Payer: Medicare Other | Attending: Orthopedic Surgery | Admitting: Physical Therapy

## 2021-08-22 DIAGNOSIS — R2689 Other abnormalities of gait and mobility: Secondary | ICD-10-CM | POA: Insufficient documentation

## 2021-08-22 DIAGNOSIS — M6281 Muscle weakness (generalized): Secondary | ICD-10-CM | POA: Insufficient documentation

## 2021-08-22 DIAGNOSIS — M25552 Pain in left hip: Secondary | ICD-10-CM | POA: Insufficient documentation

## 2021-08-22 DIAGNOSIS — R600 Localized edema: Secondary | ICD-10-CM | POA: Insufficient documentation

## 2021-08-23 ENCOUNTER — Other Ambulatory Visit: Payer: Self-pay | Admitting: Sports Medicine

## 2021-08-24 ENCOUNTER — Ambulatory Visit: Payer: Medicare Other | Admitting: Physical Therapy

## 2021-08-28 ENCOUNTER — Ambulatory Visit: Payer: Medicare Other

## 2021-08-30 ENCOUNTER — Other Ambulatory Visit: Payer: Self-pay | Admitting: Sports Medicine

## 2021-08-30 DIAGNOSIS — E785 Hyperlipidemia, unspecified: Secondary | ICD-10-CM

## 2021-08-30 DIAGNOSIS — M48061 Spinal stenosis, lumbar region without neurogenic claudication: Secondary | ICD-10-CM

## 2021-09-02 ENCOUNTER — Other Ambulatory Visit: Payer: Self-pay | Admitting: Sports Medicine

## 2021-09-02 DIAGNOSIS — I480 Paroxysmal atrial fibrillation: Secondary | ICD-10-CM

## 2021-09-28 NOTE — Telephone Encounter (Signed)
Resubmitted through [employer] for benefits summary.   Benefits Investigation Details received from [employer] Injection: Synvisc  Medical: Deductible does not apply. Once th OOP is met, the patient is covered at 100%. Only one copay applies per date of service.  PA required: Yes through [place]. Submit online at [email or web address]    PA returned that NO PA was required for [facility] (ref# [id number]). We will use synvisc instead.   Pharmacy: Product not covered under the pharmacy plan.   Specialty Pharmacy: [facility]  May fill through: [facility] [place] Copay/Coinsurance: $30 Product Copay:  Administration Coinsurance:  Administration Copay:  Deductible:  Out of Pocket Max: $4500 (met: $623.88)    Left msg for a return call from patient.

## 2021-10-26 NOTE — Telephone Encounter (Signed)
Left msg for a return call regarding gel injections.

## 2021-11-29 ENCOUNTER — Ambulatory Visit: Payer: Self-pay

## 2021-11-29 ENCOUNTER — Ambulatory Visit (INDEPENDENT_AMBULATORY_CARE_PROVIDER_SITE_OTHER): Payer: Medicare Other | Admitting: Sports Medicine

## 2021-11-29 ENCOUNTER — Ambulatory Visit (INDEPENDENT_AMBULATORY_CARE_PROVIDER_SITE_OTHER): Payer: Medicare Other

## 2021-11-29 DIAGNOSIS — M19011 Primary osteoarthritis, right shoulder: Secondary | ICD-10-CM

## 2021-11-29 DIAGNOSIS — M1712 Unilateral primary osteoarthritis, left knee: Secondary | ICD-10-CM

## 2021-11-29 DIAGNOSIS — U071 COVID-19: Secondary | ICD-10-CM

## 2021-11-29 DIAGNOSIS — M19012 Primary osteoarthritis, left shoulder: Secondary | ICD-10-CM | POA: Diagnosis not present

## 2021-11-29 DIAGNOSIS — M1711 Unilateral primary osteoarthritis, right knee: Secondary | ICD-10-CM

## 2021-11-29 DIAGNOSIS — J1282 Pneumonia due to coronavirus disease 2019: Secondary | ICD-10-CM | POA: Diagnosis not present

## 2021-11-29 MED ORDER — ALBUTEROL SULFATE HFA 108 (90 BASE) MCG/ACT IN AERS: 2.0000 | INHALATION_SPRAY | Freq: Four times a day (QID) | RESPIRATORY_TRACT | 11 refills | Status: DC | PRN

## 2021-11-29 MED ORDER — OXYCODONE-ACETAMINOPHEN 5-325 MG PO TABS: 1.0000 | ORAL_TABLET | Freq: Four times a day (QID) | ORAL | 0 refills | Status: DC | PRN

## 2021-11-29 NOTE — Assessment & Plan Note (Signed)
Jeff Green returns, he has bilateral glenohumeral osteoarthritis, last injections were done in April of this year, repeated bilateral injections today.

## 2021-11-29 NOTE — Assessment & Plan Note (Addendum)
Severe osteoarthritis, last injection with a steroid was in August 2022. We are starting viscosupplementation today with Synvisc, return to see me in 1 week for Synvisc No. 2 of 3 left knee.

## 2021-11-29 NOTE — Progress Notes (Signed)
    Procedures performed today:    Procedure: Real-time Ultrasound Guided injection of the left glenohumeral joint Device: Samsung HS60  Verbal informed consent obtained.  Time-out conducted.  Noted no overlying erythema, induration, or other signs of local infection.  Skin prepped in a sterile fashion.  Local anesthesia: Topical Ethyl chloride.  With sterile technique and under real time ultrasound guidance: Arthritic joint noted, 1 cc Kenalog 40, 2 cc lidocaine, 2 cc bupivacaine injected easily Completed without difficulty  Advised to call if fevers/chills, erythema, induration, drainage, or persistent bleeding.  Images permanently stored and available for review in PACS.  Impression: Technically successful ultrasound guided injection.  Procedure: Real-time Ultrasound Guided injection of the right glenohumeral joint Device: Samsung HS60  Verbal informed consent obtained.  Time-out conducted.  Noted no overlying erythema, induration, or other signs of local infection.  Skin prepped in a sterile fashion.  Local anesthesia: Topical Ethyl chloride.  With sterile technique and under real time ultrasound guidance: Arthritic joint noted, 1 cc Kenalog 40, 2 cc lidocaine, 2 cc bupivacaine injected easily Completed without difficulty  Advised to call if fevers/chills, erythema, induration, drainage, or persistent bleeding.  Images permanently stored and available for review in PACS.  Impression: Technically successful ultrasound guided injection.  Procedure: Real-time Ultrasound Guided injection of the left knee Device: Samsung HS60  Verbal informed consent obtained.  Time-out conducted.  Noted no overlying erythema, induration, or other signs of local infection.  Skin prepped in a sterile fashion.  Local anesthesia: Topical Ethyl chloride.  With sterile technique and under real time ultrasound guidance: Noted effusion, 1 syringe of Synvisc injected into the suprapatellar recess with a  22-gauge needle. Completed without difficulty  Advised to call if fevers/chills, erythema, induration, drainage, or persistent bleeding.  Images permanently stored and available for review in PACS.  Impression: Technically successful ultrasound guided injection.  Independent interpretation of notes and tests performed by another provider:   None.  Brief History, Exam, Impression, and Recommendations:    Primary osteoarthritis of both shoulders Jeff Green returns, he has bilateral glenohumeral osteoarthritis, last injections were done in April of this year, repeated bilateral injections today.  Primary osteoarthritis of left knee Severe osteoarthritis, last injection with a steroid was in August 2022. We are starting viscosupplementation today with Synvisc, return to see me in 1 week for Synvisc No. 2 of 3 left knee.  COVID-19 lung Jeff Green has a history of severe COVID with COVID-pneumonia and respiratory failure back in 2021, he does have some scarring, he has new onset nonproductive cough, coarse sounds right upper lobe, adding a chest x-ray, we did the steroids in his shoulder so this should help. If infiltrates noted we will add antibiotics, him also going to add his albuterol.    ____________________________________________ Jeff Green. Dianah Field, M.D., ABFM., CAQSM., AME. Primary Care and Sports Medicine [place] [facility]  Adjunct Professor of [employer]  Risk manager

## 2021-11-29 NOTE — Assessment & Plan Note (Signed)
Quante has a history of severe COVID with COVID-pneumonia and respiratory failure back in 2021, he does have some scarring, he has new onset nonproductive cough, coarse sounds right upper lobe, adding a chest x-ray, we did the steroids in his shoulder so this should help. If infiltrates noted we will add antibiotics, him also going to add his albuterol.

## 2021-12-06 ENCOUNTER — Ambulatory Visit (INDEPENDENT_AMBULATORY_CARE_PROVIDER_SITE_OTHER): Payer: Medicare Other

## 2021-12-06 ENCOUNTER — Ambulatory Visit (INDEPENDENT_AMBULATORY_CARE_PROVIDER_SITE_OTHER): Payer: Medicare Other | Admitting: Sports Medicine

## 2021-12-06 DIAGNOSIS — M1712 Unilateral primary osteoarthritis, left knee: Secondary | ICD-10-CM

## 2021-12-06 DIAGNOSIS — R0602 Shortness of breath: Secondary | ICD-10-CM | POA: Diagnosis not present

## 2021-12-06 DIAGNOSIS — Z86711 Personal history of pulmonary embolism: Secondary | ICD-10-CM

## 2021-12-06 DIAGNOSIS — I480 Paroxysmal atrial fibrillation: Secondary | ICD-10-CM

## 2021-12-06 DIAGNOSIS — R059 Cough, unspecified: Secondary | ICD-10-CM | POA: Diagnosis not present

## 2021-12-06 DIAGNOSIS — Z7901 Long term (current) use of anticoagulants: Secondary | ICD-10-CM | POA: Diagnosis not present

## 2021-12-06 DIAGNOSIS — C6290 Malignant neoplasm of unspecified testis, unspecified whether descended or undescended: Secondary | ICD-10-CM

## 2021-12-06 DIAGNOSIS — J1282 Pneumonia due to coronavirus disease 2019: Secondary | ICD-10-CM

## 2021-12-06 DIAGNOSIS — U071 COVID-19: Secondary | ICD-10-CM | POA: Diagnosis not present

## 2021-12-06 LAB — POCT INR: INR: 3.3 — AB (ref 2.0–3.0)

## 2021-12-06 NOTE — Assessment & Plan Note (Signed)
Severe osteoarthritis, last steroid injection August 2022, started viscosupplementation last week, today we did Synvisc No. 2 of 3 left knee, return in 1 week for Synvisc No. 3 of 3 left knee.

## 2021-12-06 NOTE — Addendum Note (Signed)
Addended by: Dema Severin on: 12/06/2021 10:46 AM   Modules accepted: Orders

## 2021-12-06 NOTE — Progress Notes (Addendum)
    Procedures performed today:    Procedure: Real-time Ultrasound Guided injection of the left knee Device: Samsung HS60  Verbal informed consent obtained.  Time-out conducted.  Noted no overlying erythema, induration, or other signs of local infection.  Skin prepped in a sterile fashion.  Local anesthesia: Topical Ethyl chloride.  With sterile technique and under real time ultrasound guidance: Noted trace effusion, 1 syringe of Synvisc injected into the suprapatellar recess with a 22-gauge needle. Completed without difficulty  Advised to call if fevers/chills, erythema, induration, drainage, or persistent bleeding.  Images permanently stored and available for review in PACS.  Impression: Technically successful ultrasound guided injection.  Independent interpretation of notes and tests performed by another provider:   None.  Brief History, Exam, Impression, and Recommendations:    Primary osteoarthritis of left knee Severe osteoarthritis, last steroid injection August 2022, started viscosupplementation last week, today we did Synvisc No. 2 of 3 left knee, return in 1 week for Synvisc No. 3 of 3 left knee.  Testicular cancer [facility]) Pleasant 66 year old male, history of testicular cancer left-sided back in 2001, postorchiectomy with chemotherapy back in 2001. He is having some increasing discomfort in the right testicle, on exam he does have a very mild varicocele, and some tenderness over the epididymis. There is some testicular atrophy. We will go and get an ultrasound and keep an eye on this.  Anticoagulated on Coumadin INR stable at 3.3, goal 2.5-3.5, no changes.    ____________________________________________ Gwen Her. Dianah Field, M.D., ABFM., CAQSM., AME. Primary Care and Sports Medicine [place] [facility]  Adjunct Professor of [employer] [facility] of Medicine  Risk manager

## 2021-12-06 NOTE — Assessment & Plan Note (Signed)
Pleasant 66 year old male, history of testicular cancer left-sided back in 2001, postorchiectomy with chemotherapy back in 2001. He is having some increasing discomfort in the right testicle, on exam he does have a very mild varicocele, and some tenderness over the epididymis. There is some testicular atrophy. We will go and get an ultrasound and keep an eye on this.

## 2021-12-06 NOTE — Assessment & Plan Note (Signed)
INR stable at 3.3, goal 2.5-3.5, no changes.

## 2021-12-07 ENCOUNTER — Ambulatory Visit (INDEPENDENT_AMBULATORY_CARE_PROVIDER_SITE_OTHER): Payer: Medicare Other

## 2021-12-07 DIAGNOSIS — N50811 Right testicular pain: Secondary | ICD-10-CM | POA: Diagnosis not present

## 2021-12-07 DIAGNOSIS — C6290 Malignant neoplasm of unspecified testis, unspecified whether descended or undescended: Secondary | ICD-10-CM

## 2021-12-07 DIAGNOSIS — Z8547 Personal history of malignant neoplasm of testis: Secondary | ICD-10-CM

## 2021-12-13 ENCOUNTER — Ambulatory Visit (INDEPENDENT_AMBULATORY_CARE_PROVIDER_SITE_OTHER): Payer: Medicare Other | Admitting: Sports Medicine

## 2021-12-13 ENCOUNTER — Ambulatory Visit (INDEPENDENT_AMBULATORY_CARE_PROVIDER_SITE_OTHER): Payer: Medicare Other

## 2021-12-13 ENCOUNTER — Encounter: Payer: Self-pay | Admitting: General Practice

## 2021-12-13 DIAGNOSIS — M1712 Unilateral primary osteoarthritis, left knee: Secondary | ICD-10-CM

## 2021-12-13 NOTE — Assessment & Plan Note (Signed)
Severe osteoarthritis, last steroid injection August 2022, today we did his last Synvisc injection #3 of 3 left knee

## 2021-12-13 NOTE — Progress Notes (Signed)
    Procedures performed today:    Procedure: Real-time Ultrasound Guided injection of the left knee Device: Samsung HS60  Verbal informed consent obtained.  Time-out conducted.  Noted no overlying erythema, induration, or other signs of local infection.  Skin prepped in a sterile fashion.  Local anesthesia: Topical Ethyl chloride.  With sterile technique and under real time ultrasound guidance: Noted trace effusion, 1 syringe of Synvisc injected into the suprapatellar recess with a 22-gauge needle. Completed without difficulty  Advised to call if fevers/chills, erythema, induration, drainage, or persistent bleeding.  Images permanently stored and available for review in PACS.  Impression: Technically successful ultrasound guided injection.  Independent interpretation of notes and tests performed by another provider:   None.  Brief History, Exam, Impression, and Recommendations:    Primary osteoarthritis of left knee Severe osteoarthritis, last steroid injection August 2022, today we did his last Synvisc injection #3 of 3 left knee    ____________________________________________ Gwen Her. Dianah Field, M.D., ABFM., CAQSM., AME. Primary Care and Sports Medicine [place] [facility]  Adjunct Professor of [place] [employer] [facility] of Medicine  Risk manager

## 2021-12-22 ENCOUNTER — Telehealth: Payer: Self-pay

## 2021-12-22 DIAGNOSIS — J1282 Pneumonia due to coronavirus disease 2019: Secondary | ICD-10-CM

## 2021-12-22 DIAGNOSIS — U071 COVID-19: Secondary | ICD-10-CM

## 2021-12-22 MED ORDER — HYDROCOD POLI-CHLORPHE POLI ER 10-8 MG/5ML PO SUER: 5.0000 mL | Freq: Two times a day (BID) | ORAL | 0 refills | Status: DC | PRN

## 2021-12-22 NOTE — Telephone Encounter (Signed)
Patient aware Rx sent.  

## 2021-12-22 NOTE — Telephone Encounter (Signed)
Patient called to report that the cough spray is not working. He requests something stronger because be is coughing so much.

## 2021-12-22 NOTE — Telephone Encounter (Signed)
Tussionex sent.

## 2022-01-26 ENCOUNTER — Other Ambulatory Visit: Payer: Self-pay

## 2022-01-26 MED ORDER — OXYCODONE-ACETAMINOPHEN 5-325 MG PO TABS: 1.0000 | ORAL_TABLET | Freq: Four times a day (QID) | ORAL | 0 refills | Status: DC | PRN

## 2022-02-08 ENCOUNTER — Other Ambulatory Visit: Payer: Self-pay | Admitting: Sports Medicine

## 2022-02-08 DIAGNOSIS — I480 Paroxysmal atrial fibrillation: Secondary | ICD-10-CM

## 2022-02-08 DIAGNOSIS — M4728 Other spondylosis with radiculopathy, sacral and sacrococcygeal region: Secondary | ICD-10-CM | POA: Diagnosis not present

## 2022-02-08 DIAGNOSIS — M9902 Segmental and somatic dysfunction of thoracic region: Secondary | ICD-10-CM | POA: Diagnosis not present

## 2022-02-08 DIAGNOSIS — M4725 Other spondylosis with radiculopathy, thoracolumbar region: Secondary | ICD-10-CM | POA: Diagnosis not present

## 2022-02-08 DIAGNOSIS — M4726 Other spondylosis with radiculopathy, lumbar region: Secondary | ICD-10-CM | POA: Diagnosis not present

## 2022-02-08 DIAGNOSIS — M9904 Segmental and somatic dysfunction of sacral region: Secondary | ICD-10-CM | POA: Diagnosis not present

## 2022-02-08 DIAGNOSIS — M9903 Segmental and somatic dysfunction of lumbar region: Secondary | ICD-10-CM | POA: Diagnosis not present

## 2022-02-11 DIAGNOSIS — M5432 Sciatica, left side: Secondary | ICD-10-CM | POA: Diagnosis not present

## 2022-02-11 DIAGNOSIS — M4315 Spondylolisthesis, thoracolumbar region: Secondary | ICD-10-CM | POA: Diagnosis not present

## 2022-02-11 DIAGNOSIS — M47816 Spondylosis without myelopathy or radiculopathy, lumbar region: Secondary | ICD-10-CM | POA: Diagnosis not present

## 2022-02-11 DIAGNOSIS — M4316 Spondylolisthesis, lumbar region: Secondary | ICD-10-CM | POA: Diagnosis not present

## 2022-02-11 DIAGNOSIS — M5136 Other intervertebral disc degeneration, lumbar region: Secondary | ICD-10-CM | POA: Diagnosis not present

## 2022-02-11 DIAGNOSIS — M5442 Lumbago with sciatica, left side: Secondary | ICD-10-CM | POA: Diagnosis not present

## 2022-02-11 DIAGNOSIS — R200 Anesthesia of skin: Secondary | ICD-10-CM | POA: Diagnosis not present

## 2022-02-11 DIAGNOSIS — R202 Paresthesia of skin: Secondary | ICD-10-CM | POA: Diagnosis not present

## 2022-03-05 ENCOUNTER — Other Ambulatory Visit: Payer: Self-pay

## 2022-03-05 DIAGNOSIS — I480 Paroxysmal atrial fibrillation: Secondary | ICD-10-CM

## 2022-03-05 MED ORDER — WARFARIN SODIUM 7.5 MG PO TABS: 7.5000 mg | ORAL_TABLET | Freq: Every day | ORAL | 0 refills | Status: DC

## 2022-03-07 ENCOUNTER — Telehealth: Payer: Self-pay

## 2022-03-07 DIAGNOSIS — M9904 Segmental and somatic dysfunction of sacral region: Secondary | ICD-10-CM | POA: Diagnosis not present

## 2022-03-07 DIAGNOSIS — M9902 Segmental and somatic dysfunction of thoracic region: Secondary | ICD-10-CM | POA: Diagnosis not present

## 2022-03-07 DIAGNOSIS — M9903 Segmental and somatic dysfunction of lumbar region: Secondary | ICD-10-CM | POA: Diagnosis not present

## 2022-03-07 DIAGNOSIS — M4726 Other spondylosis with radiculopathy, lumbar region: Secondary | ICD-10-CM | POA: Diagnosis not present

## 2022-03-07 DIAGNOSIS — M4728 Other spondylosis with radiculopathy, sacral and sacrococcygeal region: Secondary | ICD-10-CM | POA: Diagnosis not present

## 2022-03-07 DIAGNOSIS — M4725 Other spondylosis with radiculopathy, thoracolumbar region: Secondary | ICD-10-CM | POA: Diagnosis not present

## 2022-03-07 DIAGNOSIS — I480 Paroxysmal atrial fibrillation: Secondary | ICD-10-CM

## 2022-03-07 MED ORDER — WARFARIN SODIUM 7.5 MG PO TABS: 7.5000 mg | ORAL_TABLET | Freq: Every day | ORAL | 0 refills | Status: DC

## 2022-03-07 NOTE — Telephone Encounter (Signed)
Medication refill

## 2022-03-30 ENCOUNTER — Other Ambulatory Visit: Payer: Self-pay

## 2022-03-30 NOTE — Telephone Encounter (Signed)
Last fill 01/26/22

## 2022-04-01 MED ORDER — OXYCODONE-ACETAMINOPHEN 5-325 MG PO TABS: 1.0000 | ORAL_TABLET | Freq: Four times a day (QID) | ORAL | 0 refills | Status: DC | PRN

## 2022-04-25 ENCOUNTER — Ambulatory Visit (INDEPENDENT_AMBULATORY_CARE_PROVIDER_SITE_OTHER): Payer: Medicare Other

## 2022-04-25 ENCOUNTER — Ambulatory Visit (INDEPENDENT_AMBULATORY_CARE_PROVIDER_SITE_OTHER): Payer: Medicare Other | Admitting: Sports Medicine

## 2022-04-25 DIAGNOSIS — S61451A Open bite of right hand, initial encounter: Secondary | ICD-10-CM | POA: Diagnosis not present

## 2022-04-25 DIAGNOSIS — M19012 Primary osteoarthritis, left shoulder: Secondary | ICD-10-CM

## 2022-04-25 DIAGNOSIS — M19011 Primary osteoarthritis, right shoulder: Secondary | ICD-10-CM

## 2022-04-25 DIAGNOSIS — E119 Type 2 diabetes mellitus without complications: Secondary | ICD-10-CM

## 2022-04-25 DIAGNOSIS — Z7901 Long term (current) use of anticoagulants: Secondary | ICD-10-CM

## 2022-04-25 DIAGNOSIS — W540XXA Bitten by dog, initial encounter: Secondary | ICD-10-CM | POA: Diagnosis not present

## 2022-04-25 DIAGNOSIS — Z23 Encounter for immunization: Secondary | ICD-10-CM

## 2022-04-25 DIAGNOSIS — I480 Paroxysmal atrial fibrillation: Secondary | ICD-10-CM

## 2022-04-25 DIAGNOSIS — Z86711 Personal history of pulmonary embolism: Secondary | ICD-10-CM | POA: Diagnosis not present

## 2022-04-25 DIAGNOSIS — M48061 Spinal stenosis, lumbar region without neurogenic claudication: Secondary | ICD-10-CM | POA: Diagnosis not present

## 2022-04-25 MED ORDER — TRIAMCINOLONE ACETONIDE 40 MG/ML IJ SUSP
80.0000 mg | Freq: Once | INTRAMUSCULAR | Status: AC
  Administered 2022-04-25: 80 mg via INTRAMUSCULAR

## 2022-04-25 NOTE — Assessment & Plan Note (Signed)
History of a single level decompression with Dr. Gloriann Loan in [place], Dr. Rush Landmark has since retired so he needs another spine doctor, he does work with Dr. Vira Blanco with preferred pain management for Percocet and occasional epidurals. Persistent radicular discomfort, I would like a surgical opinion with Dr. Lynann Bologna.

## 2022-04-25 NOTE — Assessment & Plan Note (Signed)
Due for routine labs, also needs help losing weight, referral to healthy weight and wellness.

## 2022-04-25 NOTE — Assessment & Plan Note (Signed)
Known bilateral glenohumeral osteoarthritis, doing well with occasional injections, last injection was in August, repeated today bilaterally with ultrasound guidance.

## 2022-04-25 NOTE — Addendum Note (Signed)
Addended by: Tarri Glenn A on: 04/25/2022 10:14 AM   Modules accepted: Orders

## 2022-04-25 NOTE — Progress Notes (Addendum)
    Procedures performed today:    Procedure: Real-time Ultrasound Guided injection of the left glenohumeral joint Device: Samsung HS60  Verbal informed consent obtained.  Time-out conducted.  Noted no overlying erythema, induration, or other signs of local infection.  Skin prepped in a sterile fashion.  Local anesthesia: Topical Ethyl chloride.  With sterile technique and under real time ultrasound guidance: Arthritic joint noted, 1 cc Kenalog  40, 2 cc lidocaine, 2 cc bupivacaine  injected easily Completed without difficulty  Advised to call if fevers/chills, erythema, induration, drainage, or persistent bleeding.  Images permanently stored and available for review in PACS.  Impression: Technically successful ultrasound guided injection.   Procedure: Real-time Ultrasound Guided injection of the right glenohumeral joint Device: Samsung HS60  Verbal informed consent obtained.  Time-out conducted.  Noted no overlying erythema, induration, or other signs of local infection.  Skin prepped in a sterile fashion.  Local anesthesia: Topical Ethyl chloride.  With sterile technique and under real time ultrasound guidance: Arthritic joint noted, 1 cc Kenalog  40, 2 cc lidocaine, 2 cc bupivacaine  injected easily Completed without difficulty  Advised to call if fevers/chills, erythema, induration, drainage, or persistent bleeding.  Images permanently stored and available for review in PACS.  Impression: Technically successful ultrasound guided injection.  Independent interpretation of notes and tests performed by another provider:   None.  Brief History, Exam, Impression, and Recommendations:    Primary osteoarthritis of both shoulders Known bilateral glenohumeral osteoarthritis, doing well with occasional injections, last injection was in August, repeated today bilaterally with ultrasound guidance.  Dog bite, hand, right, initial encounter Recent dog bite right hand, this was a domestic  dog, minimally provoked. He does have a small skin tear dorsum of the hand, he irrigated it extensively after the bite. No evidence of cellulitis. He did have his Tdap in 2018 so he does need a booster.   Controlled type 2 diabetes mellitus without complication, without long-term current use of insulin (HCC) Due for routine labs, also needs help losing weight, referral to healthy weight and wellness.  Spinal stenosis of lumbar region History of a single level decompression with Dr. Carolee in [place], Dr. Zell has since retired so he needs another spine doctor, he does work with Dr. Ernesto with preferred pain management for Percocet and occasional epidurals. Persistent radicular discomfort, I would like a surgical opinion with Dr. Beuford.  Anticoagulated on Coumadin  INR therapeutic currently at 2.5 Continue current Coumadin  dosing 7.5 mg tabs - one half tab on Wednesday and Saturday, 1 tab other days. Recheck in a nurse visit in 1 month.  I spent 40 minutes of total time managing this patient today, this includes chart review, face to face, and non-face to face time.  This is separate from the time spent performing the above procedures  ____________________________________________ Debby PARAS. Curtis, M.D., ABFM., CAQSM., AME. Primary Care and Sports Medicine  [facility]  Adjunct Professor of [facility] Medicine  [employer]  Restaurant Manager, [employer]

## 2022-04-25 NOTE — Assessment & Plan Note (Signed)
Recent dog bite right hand, this was a domestic dog, minimally provoked. He does have a small skin tear dorsum of the hand, he irrigated it extensively after the bite. No evidence of cellulitis. He did have his Tdap in 2018 so he does need a booster.

## 2022-04-26 LAB — LIPID PANEL
Cholesterol: 199 mg/dL (ref <200)
HDL: 55 mg/dL (ref >=40)
LDL Cholesterol (Calc): 108 mg/dL (calc) — ABNORMAL HIGH
Non-HDL Cholesterol (Calc): 144 mg/dL (calc) — ABNORMAL HIGH (ref <130)
Total CHOL/HDL Ratio: 3.6 (calc) (ref <5.0)
Triglycerides: 255 mg/dL — ABNORMAL HIGH (ref <150)

## 2022-04-26 LAB — COMPLETE METABOLIC PANEL WITH GFR
AG Ratio: 1.7 (calc) (ref 1.0–2.5)
ALT: 28 U/L (ref 9–46)
AST: 27 U/L (ref 10–35)
Albumin: 4.5 g/dL (ref 3.6–5.1)
Alkaline phosphatase (APISO): 67 U/L (ref 35–144)
BUN: 21 mg/dL (ref 7–25)
CO2: 23 mmol/L (ref 20–32)
Calcium: 10.4 mg/dL — ABNORMAL HIGH (ref 8.6–10.3)
Chloride: 106 mmol/L (ref 98–110)
Creat: 1.35 mg/dL (ref 0.70–1.35)
Globulin: 2.6 g/dL (calc) (ref 1.9–3.7)
Glucose, Bld: 251 mg/dL — ABNORMAL HIGH (ref 65–99)
Potassium: 4.3 mmol/L (ref 3.5–5.3)
Sodium: 140 mmol/L (ref 135–146)
Total Bilirubin: 0.4 mg/dL (ref 0.2–1.2)
Total Protein: 7.1 g/dL (ref 6.1–8.1)
eGFR: 59 mL/min/{1.73_m2} — ABNORMAL LOW (ref >=60)

## 2022-04-26 LAB — MICROALBUMIN / CREATININE URINE RATIO
Creatinine, Urine: 155 mg/dL (ref 20–320)
Microalb Creat Ratio: 11 mcg/mg creat (ref <30)
Microalb, Ur: 1.7 mg/dL

## 2022-04-26 LAB — CBC
HCT: 47.3 % (ref 38.5–50.0)
Hemoglobin: 16.1 g/dL (ref 13.2–17.1)
MCH: 31.1 pg (ref 27.0–33.0)
MCHC: 34 g/dL (ref 32.0–36.0)
MCV: 91.5 fL (ref 80.0–100.0)
MPV: 14.2 fL — ABNORMAL HIGH (ref 7.5–12.5)
Platelets: 155 10*3/uL (ref 140–400)
RBC: 5.17 10*6/uL (ref 4.20–5.80)
RDW: 13 % (ref 11.0–15.0)
WBC: 6.8 10*3/uL (ref 3.8–10.8)

## 2022-04-26 LAB — TSH: TSH: 2.66 mIU/L (ref 0.40–4.50)

## 2022-04-26 LAB — HEMOGLOBIN A1C
Hgb A1c MFr Bld: 8.5 % of total Hgb — ABNORMAL HIGH (ref <5.7)
Mean Plasma Glucose: 197 mg/dL
eAG (mmol/L): 10.9 mmol/L

## 2022-04-26 LAB — PROTIME-INR
INR: 2.5 — ABNORMAL HIGH
Prothrombin Time: 25.3 s — ABNORMAL HIGH (ref 9.0–11.5)

## 2022-04-26 NOTE — Assessment & Plan Note (Signed)
INR therapeutic currently at 2.5 Continue current Coumadin dosing 7.5 mg tabs - one half tab on Wednesday and Saturday, 1 tab other days. Recheck in a nurse visit in 1 month.

## 2022-05-03 ENCOUNTER — Encounter: Payer: Medicare Other | Admitting: Nurse Practitioner

## 2022-05-08 DIAGNOSIS — M9903 Segmental and somatic dysfunction of lumbar region: Secondary | ICD-10-CM | POA: Diagnosis not present

## 2022-05-08 DIAGNOSIS — M5442 Lumbago with sciatica, left side: Secondary | ICD-10-CM | POA: Diagnosis not present

## 2022-05-08 DIAGNOSIS — M9904 Segmental and somatic dysfunction of sacral region: Secondary | ICD-10-CM | POA: Diagnosis not present

## 2022-05-08 DIAGNOSIS — M9902 Segmental and somatic dysfunction of thoracic region: Secondary | ICD-10-CM | POA: Diagnosis not present

## 2022-05-17 ENCOUNTER — Other Ambulatory Visit: Payer: Self-pay

## 2022-05-17 MED ORDER — OXYCODONE-ACETAMINOPHEN 5-325 MG PO TABS: 1.0000 | ORAL_TABLET | Freq: Four times a day (QID) | ORAL | 0 refills | Status: DC | PRN

## 2022-05-17 NOTE — Telephone Encounter (Signed)
Last fill 04/01/22 last visit 04/25/22

## 2022-05-22 DIAGNOSIS — M9904 Segmental and somatic dysfunction of sacral region: Secondary | ICD-10-CM | POA: Diagnosis not present

## 2022-05-22 DIAGNOSIS — M9902 Segmental and somatic dysfunction of thoracic region: Secondary | ICD-10-CM | POA: Diagnosis not present

## 2022-05-22 DIAGNOSIS — M9903 Segmental and somatic dysfunction of lumbar region: Secondary | ICD-10-CM | POA: Diagnosis not present

## 2022-05-22 DIAGNOSIS — M5442 Lumbago with sciatica, left side: Secondary | ICD-10-CM | POA: Diagnosis not present

## 2022-06-04 DIAGNOSIS — H2513 Age-related nuclear cataract, bilateral: Secondary | ICD-10-CM | POA: Diagnosis not present

## 2022-06-04 DIAGNOSIS — H04123 Dry eye syndrome of bilateral lacrimal glands: Secondary | ICD-10-CM | POA: Diagnosis not present

## 2022-06-04 DIAGNOSIS — H21563 Pupillary abnormality, bilateral: Secondary | ICD-10-CM | POA: Diagnosis not present

## 2022-06-04 DIAGNOSIS — H2512 Age-related nuclear cataract, left eye: Secondary | ICD-10-CM | POA: Diagnosis not present

## 2022-06-04 LAB — HM DIABETES EYE EXAM

## 2022-06-07 ENCOUNTER — Encounter: Payer: Self-pay | Admitting: Sports Medicine

## 2022-06-12 ENCOUNTER — Telehealth: Payer: Self-pay | Admitting: Sports Medicine

## 2022-06-12 NOTE — Telephone Encounter (Signed)
Called patient to schedule Medicare Annual Wellness Visit (AWV). Left message for patient to call back and schedule Medicare Annual Wellness Visit (AWV).  Last date of AWV: Never  Please schedule an appointment at any time with Nurse Health Advisor.  If any questions, please contact me at [phone].  Thank you ,  Lin Givens Patient Access Advocate II Direct Dial: [phone]

## 2022-06-20 ENCOUNTER — Other Ambulatory Visit: Payer: Self-pay

## 2022-06-20 MED ORDER — OXYCODONE-ACETAMINOPHEN 5-325 MG PO TABS: 1.0000 | ORAL_TABLET | Freq: Four times a day (QID) | ORAL | 0 refills | Status: DC | PRN

## 2022-06-20 NOTE — Telephone Encounter (Signed)
Last fill 05/17/22 last visit 04/25/22

## 2022-07-31 ENCOUNTER — Other Ambulatory Visit: Payer: Self-pay | Admitting: Sports Medicine

## 2022-08-02 ENCOUNTER — Other Ambulatory Visit: Payer: Self-pay

## 2022-08-02 MED ORDER — DILTIAZEM HCL ER BEADS 120 MG PO CP24: 120.0000 mg | ORAL_CAPSULE | Freq: Every day | ORAL | 1 refills | Status: DC

## 2022-08-02 MED ORDER — OXYCODONE-ACETAMINOPHEN 5-325 MG PO TABS: 1.0000 | ORAL_TABLET | Freq: Four times a day (QID) | ORAL | 0 refills | Status: DC | PRN

## 2022-08-02 NOTE — Telephone Encounter (Signed)
Last fill 06/20/22 last visit 04/25/22

## 2022-08-11 ENCOUNTER — Other Ambulatory Visit: Payer: Self-pay | Admitting: Sports Medicine

## 2022-08-11 DIAGNOSIS — E785 Hyperlipidemia, unspecified: Secondary | ICD-10-CM

## 2022-08-29 ENCOUNTER — Other Ambulatory Visit: Payer: Self-pay | Admitting: Sports Medicine

## 2022-08-29 DIAGNOSIS — I480 Paroxysmal atrial fibrillation: Secondary | ICD-10-CM

## 2022-08-29 DIAGNOSIS — E785 Hyperlipidemia, unspecified: Secondary | ICD-10-CM

## 2022-09-04 ENCOUNTER — Telehealth: Payer: Self-pay | Admitting: Sports Medicine

## 2022-09-04 MED ORDER — OXYCODONE-ACETAMINOPHEN 5-325 MG PO TABS: 1.0000 | ORAL_TABLET | Freq: Four times a day (QID) | ORAL | 0 refills | Status: DC | PRN

## 2022-09-04 NOTE — Telephone Encounter (Signed)
Patient called requesting a refill of ;   oxyCodone 5-325 mg   Pharmacy ;  [facility] [place] on [address]

## 2022-09-04 NOTE — Telephone Encounter (Signed)
Refilled

## 2022-09-14 ENCOUNTER — Telehealth: Payer: Self-pay | Admitting: Sports Medicine

## 2022-09-14 DIAGNOSIS — E785 Hyperlipidemia, unspecified: Secondary | ICD-10-CM

## 2022-09-14 MED ORDER — ROSUVASTATIN CALCIUM 40 MG PO TABS
40.0000 mg | ORAL_TABLET | Freq: Every day | ORAL | 3 refills | Status: DC
Start: 2022-09-14 — End: 2023-09-05

## 2022-09-14 NOTE — Telephone Encounter (Signed)
Thank you very much, Weston Brass, for doing this.

## 2022-09-14 NOTE — Telephone Encounter (Signed)
Patient called requesting a refill of ;  Rosuvastain 40mg  He has one pill left   Pharmacy ;  [facility] [address] [place]

## 2022-09-14 NOTE — Telephone Encounter (Signed)
Med refill

## 2022-09-27 DIAGNOSIS — M9903 Segmental and somatic dysfunction of lumbar region: Secondary | ICD-10-CM | POA: Diagnosis not present

## 2022-09-27 DIAGNOSIS — M9904 Segmental and somatic dysfunction of sacral region: Secondary | ICD-10-CM | POA: Diagnosis not present

## 2022-09-27 DIAGNOSIS — M9902 Segmental and somatic dysfunction of thoracic region: Secondary | ICD-10-CM | POA: Diagnosis not present

## 2022-09-27 DIAGNOSIS — M5442 Lumbago with sciatica, left side: Secondary | ICD-10-CM | POA: Diagnosis not present

## 2022-09-28 ENCOUNTER — Telehealth: Payer: Self-pay | Admitting: Sports Medicine

## 2022-09-28 DIAGNOSIS — I480 Paroxysmal atrial fibrillation: Secondary | ICD-10-CM

## 2022-09-28 MED ORDER — WARFARIN SODIUM 7.5 MG PO TABS: 7.5000 mg | ORAL_TABLET | Freq: Every day | ORAL | 0 refills | Status: DC

## 2022-09-28 MED ORDER — OXYCODONE-ACETAMINOPHEN 5-325 MG PO TABS: 1.0000 | ORAL_TABLET | Freq: Four times a day (QID) | ORAL | 0 refills | Status: DC | PRN

## 2022-09-28 NOTE — Telephone Encounter (Signed)
Done

## 2022-09-28 NOTE — Telephone Encounter (Signed)
Pt's mom passed away on Wed. He needs to be in [place] on 06/13 for services. He will be leaving on 06/12. He is needing refills before he leaves on Oxycodone 5-325mg   and on Warfarin. [facility] and phone number on file is correct.

## 2022-10-01 ENCOUNTER — Telehealth: Payer: Self-pay | Admitting: Sports Medicine

## 2022-10-01 NOTE — Telephone Encounter (Signed)
Task completed. Per patient, he pick up the medications over the weekend.

## 2022-10-01 NOTE — Telephone Encounter (Signed)
Pt called. He is having surgery 6/19 on left eye to remove cataract./getting a new lens put in/Need to hold coumadin.

## 2022-10-02 ENCOUNTER — Other Ambulatory Visit (INDEPENDENT_AMBULATORY_CARE_PROVIDER_SITE_OTHER): Payer: Medicare Other

## 2022-10-02 ENCOUNTER — Ambulatory Visit (INDEPENDENT_AMBULATORY_CARE_PROVIDER_SITE_OTHER): Payer: Medicare Other | Admitting: Sports Medicine

## 2022-10-02 DIAGNOSIS — J310 Chronic rhinitis: Secondary | ICD-10-CM

## 2022-10-02 DIAGNOSIS — M19012 Primary osteoarthritis, left shoulder: Secondary | ICD-10-CM | POA: Diagnosis not present

## 2022-10-02 DIAGNOSIS — I480 Paroxysmal atrial fibrillation: Secondary | ICD-10-CM

## 2022-10-02 DIAGNOSIS — M19011 Primary osteoarthritis, right shoulder: Secondary | ICD-10-CM

## 2022-10-02 DIAGNOSIS — E119 Type 2 diabetes mellitus without complications: Secondary | ICD-10-CM

## 2022-10-02 DIAGNOSIS — Z86711 Personal history of pulmonary embolism: Secondary | ICD-10-CM

## 2022-10-02 DIAGNOSIS — Z7901 Long term (current) use of anticoagulants: Secondary | ICD-10-CM | POA: Diagnosis not present

## 2022-10-02 DIAGNOSIS — R7309 Other abnormal glucose: Secondary | ICD-10-CM | POA: Diagnosis not present

## 2022-10-02 MED ORDER — AZELASTINE HCL 0.1 % NA SOLN
2.0000 | Freq: Two times a day (BID) | NASAL | 1 refills | Status: DC
Start: 2022-10-02 — End: 2023-05-02

## 2022-10-02 NOTE — Assessment & Plan Note (Addendum)
Jeff Green has worked hard with healthy weight and wellness and diet, his last A1c was over 8%, we will recheck it today.  Update: A1c is uncontrolled, patient aware of the risks and still declines additional treatment to improve A1c, patient autonomy will be respected.

## 2022-10-02 NOTE — Progress Notes (Addendum)
    Procedures performed today:    Procedure: Real-time Ultrasound Guided injection of the right glenohumeral joint Device: Samsung HS60  Verbal informed consent obtained.  Time-out conducted.  Noted no overlying erythema, induration, or other signs of local infection.  Skin prepped in a sterile fashion.  Local anesthesia: Topical Ethyl chloride.  With sterile technique and under real time ultrasound guidance: Arthritic joint noted, 1 cc Kenalog 40, 2 cc lidocaine, 2 cc bupivacaine injected easily Completed without difficulty  Advised to call if fevers/chills, erythema, induration, drainage, or persistent bleeding.  Images permanently stored and available for review in PACS.  Impression: Technically successful ultrasound guided injection.  Independent interpretation of notes and tests performed by another provider:   None.  Brief History, Exam, Impression, and Recommendations:    Controlled type 2 diabetes mellitus without complication, without long-term current use of insulin (HCC) Jeff Green has worked hard with healthy weight and wellness and diet, his last A1c was over 8%, we will recheck it today.  Update: A1c is uncontrolled, patient aware of the risks and still declines additional treatment to improve A1c, patient autonomy will be respected.   Primary osteoarthritis of both shoulders Increasing pain, right-sided glenohumeral joint injection today, return to see me as needed.   Chronic rhinitis Jeff Green has chronic rhinitis, no known triggers, he also has what appears to be a deviated septum on exam, he tried Flonase without improvement, switching to nasal azelastine, I would also like ENT to weigh in.    ____________________________________________ Ihor Austin. Benjamin Stain, M.D., ABFM., CAQSM., AME. Primary Care and Sports Medicine [place] [facility]  Adjunct Professor of Family Medicine  [employer] [facility] of Medicine  Stage manager

## 2022-10-02 NOTE — Assessment & Plan Note (Signed)
Increasing pain, right-sided glenohumeral joint injection today, return to see me as needed.

## 2022-10-02 NOTE — Assessment & Plan Note (Signed)
Jeff Green has chronic rhinitis, no known triggers, he also has what appears to be a deviated septum on exam, he tried Flonase without improvement, switching to nasal azelastine, I would also like ENT to weigh in.

## 2022-10-03 LAB — COMPREHENSIVE METABOLIC PANEL
AG Ratio: 2.1 (calc) (ref 1.0–2.5)
ALT: 23 U/L (ref 9–46)
AST: 22 U/L (ref 10–35)
Albumin: 4.6 g/dL (ref 3.6–5.1)
Alkaline phosphatase (APISO): 64 U/L (ref 35–144)
BUN: 19 mg/dL (ref 7–25)
CO2: 25 mmol/L (ref 20–32)
Calcium: 10.4 mg/dL — ABNORMAL HIGH (ref 8.6–10.3)
Chloride: 107 mmol/L (ref 98–110)
Creat: 1.28 mg/dL (ref 0.70–1.35)
Globulin: 2.2 g/dL (calc) (ref 1.9–3.7)
Glucose, Bld: 217 mg/dL — ABNORMAL HIGH (ref 65–99)
Potassium: 4.4 mmol/L (ref 3.5–5.3)
Sodium: 141 mmol/L (ref 135–146)
Total Bilirubin: 0.6 mg/dL (ref 0.2–1.2)
Total Protein: 6.8 g/dL (ref 6.1–8.1)

## 2022-10-03 LAB — HEMOGLOBIN A1C
Hgb A1c MFr Bld: 8.7 % of total Hgb — ABNORMAL HIGH (ref <5.7)
Mean Plasma Glucose: 203 mg/dL
eAG (mmol/L): 11.2 mmol/L

## 2022-10-03 LAB — CBC
HCT: 44.9 % (ref 38.5–50.0)
Hemoglobin: 15 g/dL (ref 13.2–17.1)
MCH: 30.7 pg (ref 27.0–33.0)
MCHC: 33.4 g/dL (ref 32.0–36.0)
MCV: 92 fL (ref 80.0–100.0)
MPV: 14.6 fL — ABNORMAL HIGH (ref 7.5–12.5)
Platelets: 140 10*3/uL (ref 140–400)
RBC: 4.88 Million/uL (ref 4.20–5.80)
RDW: 13.2 % (ref 11.0–15.0)
WBC: 6.9 10*3/uL (ref 3.8–10.8)

## 2022-10-03 LAB — PROTIME-INR
INR: 3.3 — ABNORMAL HIGH
Prothrombin Time: 32.4 s — ABNORMAL HIGH (ref 9.0–11.5)

## 2022-10-10 DIAGNOSIS — H2512 Age-related nuclear cataract, left eye: Secondary | ICD-10-CM | POA: Diagnosis not present

## 2022-11-13 ENCOUNTER — Telehealth: Payer: Self-pay | Admitting: Sports Medicine

## 2022-11-13 MED ORDER — OXYCODONE-ACETAMINOPHEN 5-325 MG PO TABS: 1.0000 | ORAL_TABLET | Freq: Four times a day (QID) | ORAL | 0 refills | Status: DC | PRN

## 2022-11-13 NOTE — Telephone Encounter (Signed)
Patient is requesting a refill on oxycodone-acetaminophen 5-325mg   [facility] [phone]

## 2022-11-13 NOTE — Telephone Encounter (Signed)
Sent!

## 2022-11-28 DIAGNOSIS — M5442 Lumbago with sciatica, left side: Secondary | ICD-10-CM | POA: Diagnosis not present

## 2022-11-28 DIAGNOSIS — M9902 Segmental and somatic dysfunction of thoracic region: Secondary | ICD-10-CM | POA: Diagnosis not present

## 2022-11-28 DIAGNOSIS — M9903 Segmental and somatic dysfunction of lumbar region: Secondary | ICD-10-CM | POA: Diagnosis not present

## 2022-11-28 DIAGNOSIS — M9904 Segmental and somatic dysfunction of sacral region: Secondary | ICD-10-CM | POA: Diagnosis not present

## 2022-12-17 ENCOUNTER — Telehealth: Payer: Self-pay | Admitting: Sports Medicine

## 2022-12-17 NOTE — Telephone Encounter (Signed)
Patient called requesting a refill of oxyCODONE-acetaminophen (PERCOCET/ROXICET) 5-325 MG tablet [[id number]]   Pharmacy ;   [facility] [id number] - [place], [place] - [address]

## 2022-12-18 MED ORDER — OXYCODONE-ACETAMINOPHEN 5-325 MG PO TABS: 1.0000 | ORAL_TABLET | Freq: Four times a day (QID) | ORAL | 0 refills | Status: DC | PRN

## 2022-12-24 ENCOUNTER — Other Ambulatory Visit: Payer: Self-pay | Admitting: Sports Medicine

## 2022-12-24 DIAGNOSIS — I480 Paroxysmal atrial fibrillation: Secondary | ICD-10-CM

## 2023-01-22 ENCOUNTER — Telehealth: Payer: Self-pay | Admitting: Sports Medicine

## 2023-01-22 NOTE — Telephone Encounter (Signed)
Prescription Request  01/22/2023  LOV: 10/02/2022  What is the name of the medication or equipment? Oxycodone  Have you contacted your pharmacy to request a refill? No   Which pharmacy would you like this sent to?  [id number] - [place], [place] - [address] [place] [address] Phone: [phone] Fax: [phone]     Patient notified that their request is being sent to the clinical staff for review and that they should receive a response within 2 business days.   Please advise at Mobile [phone] (mobile)

## 2023-01-23 MED ORDER — OXYCODONE-ACETAMINOPHEN 5-325 MG PO TABS: 1.0000 | ORAL_TABLET | Freq: Four times a day (QID) | ORAL | 0 refills | Status: DC | PRN

## 2023-01-23 NOTE — Telephone Encounter (Signed)
Left detailed voice mail message on patient home #

## 2023-01-23 NOTE — Telephone Encounter (Signed)
Refilled

## 2023-02-19 ENCOUNTER — Telehealth: Payer: Self-pay | Admitting: Sports Medicine

## 2023-02-19 MED ORDER — OXYCODONE-ACETAMINOPHEN 5-325 MG PO TABS: 1.0000 | ORAL_TABLET | Freq: Four times a day (QID) | ORAL | 0 refills | Status: DC | PRN

## 2023-02-19 NOTE — Telephone Encounter (Signed)
Patient is requesting refills on Percocet/Roxicet 5-325mg    Please Submit to [facility] [place] [address]  [phone]

## 2023-02-19 NOTE — Telephone Encounter (Signed)
done

## 2023-03-07 ENCOUNTER — Other Ambulatory Visit: Payer: Self-pay | Admitting: Sports Medicine

## 2023-03-07 DIAGNOSIS — M48061 Spinal stenosis, lumbar region without neurogenic claudication: Secondary | ICD-10-CM

## 2023-03-20 DIAGNOSIS — M5137 Other intervertebral disc degeneration, lumbosacral region with discogenic back pain only: Secondary | ICD-10-CM | POA: Diagnosis not present

## 2023-03-20 DIAGNOSIS — M9904 Segmental and somatic dysfunction of sacral region: Secondary | ICD-10-CM | POA: Diagnosis not present

## 2023-03-20 DIAGNOSIS — M5417 Radiculopathy, lumbosacral region: Secondary | ICD-10-CM | POA: Diagnosis not present

## 2023-03-20 DIAGNOSIS — M9903 Segmental and somatic dysfunction of lumbar region: Secondary | ICD-10-CM | POA: Diagnosis not present

## 2023-03-27 ENCOUNTER — Other Ambulatory Visit: Payer: Self-pay

## 2023-03-27 NOTE — Telephone Encounter (Signed)
Medication was refilled.   3 pharmacies listed. [facility] due to [facility]'s [facility].    Copied from CRM [id number]. Topic: Clinical - Medication Refill >> Mar 26, 2023 12:19 PM Clayton Bibles wrote: Most Recent Primary Care Visit:  Provider: Monica Becton  Department: [facility] CARE MK[facility]  Visit Type: OFFICE VISIT  Date: 10/02/2022  Medication: Oxycodone 5-325 MG  Has the patient contacted their pharmacy? No (Agent: If no, request that the patient contact the pharmacy for the refill. If patient does not wish to contact the pharmacy document the reason why and proceed with request.) (Agent: If yes, when and what did the pharmacy advise?)  Is this the correct pharmacy for this prescription? Yes - [facility]; Kathryne Sharper If no, delete pharmacy and type the correct one.  This is the patient's preferred pharmacy:  [facility] [id number] - [place], [place] - [address] [place] [address] Phone: [phone] Fax: [phone]  [facility] [address] [place] - [phone] [place] [address] Phone: [phone] Fax: [phone]   Has the prescription been filled recently? No  Is the patient out of the medication? Yes  Has the patient been seen for an appointment in the last year OR does the patient have an upcoming appointment? Yes  Can we respond through MyChart? No  Agent: Please be advised that Rx refills may take up to 3 business days. We ask that you follow-up with your pharmacy.

## 2023-03-28 ENCOUNTER — Telehealth: Payer: Self-pay

## 2023-03-28 MED ORDER — OXYCODONE-ACETAMINOPHEN 5-325 MG PO TABS: 1.0000 | ORAL_TABLET | Freq: Four times a day (QID) | ORAL | 0 refills | Status: DC | PRN

## 2023-03-28 NOTE — Telephone Encounter (Signed)
Per E2C2 agent - multiple messages were sent requesting the med refill for oxycodone/ace. Patient is currently out of his medication and is requesting for provider to expedite the refill request. Please send to [facility] pharmacy. Thanks in advance.   Last OV: 10/02/22 Next OV: no appt scheduled Last RF: 02/19/23

## 2023-03-28 NOTE — Telephone Encounter (Signed)
Noted  

## 2023-03-28 NOTE — Telephone Encounter (Signed)
Done

## 2023-03-29 ENCOUNTER — Telehealth: Payer: Self-pay

## 2023-03-29 NOTE — Telephone Encounter (Signed)
Task completed. Rx sent to the pharmacy on 03/28/23.

## 2023-03-29 NOTE — Telephone Encounter (Signed)
Copied from CRM [id number]. Topic: Clinical - Prescription Issue >> Mar 28, 2023  2:44 PM Alcus Dad H wrote: Reason for CRM: Can someone give pt a call regarding refill for his oxyCODONE-acetaminophen (PERCOCET/ROXICET) 5-325 MG tablet, 2 CRM's have been created with high priority but patient stated he has not received refill. Correct pharmacy is [facility]

## 2023-03-30 ENCOUNTER — Other Ambulatory Visit: Payer: Self-pay | Admitting: Sports Medicine

## 2023-03-30 DIAGNOSIS — I480 Paroxysmal atrial fibrillation: Secondary | ICD-10-CM

## 2023-05-02 ENCOUNTER — Telehealth: Payer: Self-pay

## 2023-05-02 MED ORDER — OXYCODONE-ACETAMINOPHEN 5-325 MG PO TABS: 1.0000 | ORAL_TABLET | Freq: Four times a day (QID) | ORAL | 0 refills | Status: DC | PRN

## 2023-05-02 NOTE — Telephone Encounter (Signed)
 Refilled

## 2023-05-02 NOTE — Telephone Encounter (Signed)
 Copied from CRM [id number]. Topic: Clinical - Medication Refill >> May 02, 2023 11:26 AM Joesph PARAS wrote: Most Recent Primary Care Visit:  Provider: CURTIS DEBBY PARAS  Department: [facility] CARE MKV  Visit Type: OFFICE VISIT  Date: 10/02/2022  Medication:  oxyCODONE -acetaminophen  (PERCOCET/ROXICET) 5-325 MG tablet    Has the patient contacted their pharmacy? Yes - No Refills  Is this the correct pharmacy for this prescription? Yes  This is the patient's preferred pharmacy:  [facility] [id number] - [place], [place] - [address] [place] [address] Phone: [phone] Fax: [phone]   Has the prescription been filled recently? Yes  Is the patient out of the medication? Yes  Has the patient been seen for an appointment in the last year OR does the patient have an upcoming appointment? Yes  Can we respond through MyChart? No - Phone Call  Agent: Please be advised that Rx refills may take up to 3 business days. We ask that you follow-up with your pharmacy.

## 2023-06-14 ENCOUNTER — Other Ambulatory Visit: Payer: Self-pay | Admitting: Sports Medicine

## 2023-06-14 NOTE — Telephone Encounter (Signed)
Copied from CRM [id number]. Topic: Clinical - Medication Refill >> Jun 14, 2023  3:42 PM Alessandra Bevels wrote: Most Recent Primary Care Visit:  Provider: Monica Becton  Department: [facility] CARE [facility]  Visit Type: OFFICE VISIT  Date: 10/02/2022  Medication: oxyCODONE-acetaminophen (PERCOCET/ROXICET) 5-325 MG tablet [[id number]]  Has the patient contacted their pharmacy? No (Agent: If no, request that the patient contact the pharmacy for the refill. If patient does not wish to contact the pharmacy document the reason why and proceed with request.) (Agent: If yes, when and what did the pharmacy advise?)  Is this the correct pharmacy for this prescription? Yes If no, delete pharmacy and type the correct one.  This is the patient's preferred pharmacy:  [facility] [id number] - [place], [place] - [address] [place] [address] Phone: [phone] Fax: [phone]  [facility] [address] [place] - [phone]  [place] [address] Phone: [phone] Fax: [phone]   Has the prescription been filled recently? Yes  Is the patient out of the medication? No  Has the patient been seen for an appointment in the last year OR does the patient have an upcoming appointment? Yes  Can we respond through MyChart? Yes  Agent: Please be advised that Rx refills may take up to 3 business days. We ask that you follow-up with your pharmacy.

## 2023-06-15 MED ORDER — OXYCODONE-ACETAMINOPHEN 5-325 MG PO TABS: 1.0000 | ORAL_TABLET | Freq: Four times a day (QID) | ORAL | 0 refills | Status: DC | PRN

## 2023-06-30 ENCOUNTER — Other Ambulatory Visit: Payer: Self-pay | Admitting: Sports Medicine

## 2023-06-30 DIAGNOSIS — I480 Paroxysmal atrial fibrillation: Secondary | ICD-10-CM

## 2023-07-19 ENCOUNTER — Telehealth: Payer: Self-pay | Admitting: Sports Medicine

## 2023-07-19 MED ORDER — OXYCODONE-ACETAMINOPHEN 5-325 MG PO TABS: 1.0000 | ORAL_TABLET | Freq: Four times a day (QID) | ORAL | 0 refills | Status: DC | PRN

## 2023-07-19 NOTE — Telephone Encounter (Signed)
 Prescription Request  07/19/2023  LOV: 10/02/2022  What is the name of the medication or equipment? oxyCODONE-acetaminophen (PERCOCET/ROXICET) 5-325 MG tablet   Have you contacted your pharmacy to request a refill? Yes   Which pharmacy would you like this sent to?   [facility] [id number] - [place], [place] - [address] [place] [address] Phone: [phone] Fax: [phone]   Patient notified that their request is being sent to the clinical staff for review and that they should receive a response within 2 business days.   Please advise at [facility] [phone]

## 2023-08-06 NOTE — Progress Notes (Signed)
 [facility] Quality Team Note  Name: Jeff Green Date of Birth: Jan 23, 1956 MRN: [id number] Date: 08/06/2023  [facility] Quality Team has reviewed this patient's chart, please see recommendations below:  [facility] Quality Other; (PATIENT NEEDS APPT FOR THE FOLLOWING LABS: A1C AND KED LABS. PATIENT NEEDS URINE MICROALBUMIN/CREATININE RATIO AS WELL AS eGFR)

## 2023-08-09 ENCOUNTER — Other Ambulatory Visit: Payer: Self-pay | Admitting: Sports Medicine

## 2023-08-09 DIAGNOSIS — E785 Hyperlipidemia, unspecified: Secondary | ICD-10-CM

## 2023-08-23 ENCOUNTER — Other Ambulatory Visit: Payer: Self-pay | Admitting: Sports Medicine

## 2023-08-23 NOTE — Telephone Encounter (Unsigned)
 Copied from CRM [id number]. Topic: Clinical - Medication Refill >> Aug 23, 2023 11:11 AM Jamas Maywood wrote: Most Recent Primary Care Visit:  Provider: Gean Keels  Department: [facility]  Visit Type: OFFICE VISIT  Date: 10/02/2022  Medication: oxyCODONE -acetaminophen  (PERCOCET/ROXICET) 5-325 MG tablet  Has the patient contacted their pharmacy? Yes (Agent: If no, request that the patient contact the pharmacy for the refill. If patient does not wish to contact the pharmacy document the reason why and proceed with request.) (Agent: If yes, when and what did the pharmacy advise?)  Is this the correct pharmacy for this prescription? Yes If no, delete pharmacy and type the correct one.  This is the patient's preferred pharmacy:  [facility] [id number] - [place], [place] - [address] [place] [address] Phone: [phone] Fax: [phone]   Has the prescription been filled recently? Yes  Is the patient out of the medication? Yes  Has the patient been seen for an appointment in the last year OR does the patient have an upcoming appointment? Yes  Can we respond through MyChart? Yes  Agent: Please be advised that Rx refills may take up to 3 business days. We ask that you follow-up with your pharmacy.

## 2023-08-26 MED ORDER — OXYCODONE-ACETAMINOPHEN 5-325 MG PO TABS: 1.0000 | ORAL_TABLET | Freq: Four times a day (QID) | ORAL | 0 refills | Status: DC | PRN

## 2023-08-26 NOTE — Telephone Encounter (Signed)
 Requesting rx rf of oxycodone  - acet 5-325  Last written 07/19/2023 Last OV 10/02/2022 Upcoming appt= none

## 2023-09-05 ENCOUNTER — Other Ambulatory Visit: Payer: Self-pay | Admitting: Sports Medicine

## 2023-09-05 DIAGNOSIS — E785 Hyperlipidemia, unspecified: Secondary | ICD-10-CM

## 2023-09-27 ENCOUNTER — Other Ambulatory Visit: Payer: Self-pay | Admitting: Sports Medicine

## 2023-09-27 DIAGNOSIS — I480 Paroxysmal atrial fibrillation: Secondary | ICD-10-CM

## 2023-09-30 ENCOUNTER — Other Ambulatory Visit: Payer: Self-pay | Admitting: Sports Medicine

## 2023-09-30 MED ORDER — OXYCODONE-ACETAMINOPHEN 5-325 MG PO TABS: 1.0000 | ORAL_TABLET | Freq: Four times a day (QID) | ORAL | 0 refills | Status: DC | PRN

## 2023-09-30 NOTE — Telephone Encounter (Signed)
 Requesting rx rf of oxy-acet 5-325 Last written 08/26/2023 Last OV 06/11/224 Upcoming appt = none

## 2023-09-30 NOTE — Telephone Encounter (Signed)
 Copied from CRM [id number]. Topic: Clinical - Medication Refill >> Sep 30, 2023  8:53 AM Bearl Botts A wrote: Medication: oxyCODONE -acetaminophen  (PERCOCET/ROXICET) 5-325 MG tablet  Has the patient contacted their pharmacy? No Patient states he has to call his PCP office to get a new refill sent in every month. He cannot just call the pharmacy for a refill.   This is the patient's preferred pharmacy:  [facility] [id number] - [place], [place] - [address] [place] [address] Phone: [phone] Fax: [phone]   Is this the correct pharmacy for this prescription? Yes   Has the prescription been filled recently? No  Is the patient out of the medication? Yes  Has the patient been seen for an appointment in the last year OR does the patient have an upcoming appointment? Yes  Can we respond through MyChart? No  Agent: Please be advised that Rx refills may take up to 3 business days. We ask that you follow-up with your pharmacy.

## 2023-10-05 DIAGNOSIS — M519 Unspecified thoracic, thoracolumbar and lumbosacral intervertebral disc disorder: Secondary | ICD-10-CM | POA: Diagnosis not present

## 2023-10-05 DIAGNOSIS — Z7901 Long term (current) use of anticoagulants: Secondary | ICD-10-CM | POA: Diagnosis not present

## 2023-10-05 DIAGNOSIS — R531 Weakness: Secondary | ICD-10-CM | POA: Diagnosis not present

## 2023-10-05 DIAGNOSIS — M25561 Pain in right knee: Secondary | ICD-10-CM | POA: Diagnosis not present

## 2023-10-05 DIAGNOSIS — M4807 Spinal stenosis, lumbosacral region: Secondary | ICD-10-CM | POA: Diagnosis not present

## 2023-10-05 DIAGNOSIS — M47816 Spondylosis without myelopathy or radiculopathy, lumbar region: Secondary | ICD-10-CM | POA: Diagnosis not present

## 2023-10-05 DIAGNOSIS — M25562 Pain in left knee: Secondary | ICD-10-CM | POA: Diagnosis not present

## 2023-10-05 DIAGNOSIS — Z79891 Long term (current) use of opiate analgesic: Secondary | ICD-10-CM | POA: Diagnosis not present

## 2023-10-05 DIAGNOSIS — I10 Essential (primary) hypertension: Secondary | ICD-10-CM | POA: Diagnosis not present

## 2023-10-05 DIAGNOSIS — G8929 Other chronic pain: Secondary | ICD-10-CM | POA: Diagnosis not present

## 2023-10-05 DIAGNOSIS — M48061 Spinal stenosis, lumbar region without neurogenic claudication: Secondary | ICD-10-CM | POA: Diagnosis not present

## 2023-10-05 DIAGNOSIS — M545 Low back pain, unspecified: Secondary | ICD-10-CM | POA: Diagnosis not present

## 2023-10-05 DIAGNOSIS — E119 Type 2 diabetes mellitus without complications: Secondary | ICD-10-CM | POA: Diagnosis not present

## 2023-10-05 DIAGNOSIS — M549 Dorsalgia, unspecified: Secondary | ICD-10-CM | POA: Diagnosis not present

## 2023-10-05 DIAGNOSIS — M5186 Other intervertebral disc disorders, lumbar region: Secondary | ICD-10-CM | POA: Diagnosis not present

## 2023-10-12 ENCOUNTER — Other Ambulatory Visit: Payer: Self-pay | Admitting: Sports Medicine

## 2023-10-28 ENCOUNTER — Other Ambulatory Visit: Payer: Self-pay | Admitting: Sports Medicine

## 2023-10-28 NOTE — Telephone Encounter (Unsigned)
 Copied from CRM [id number]. Topic: Clinical - Medication Refill >> Oct 28, 2023 12:42 PM Merlynn A wrote: Medication: oxyCODONE -acetaminophen  (PERCOCET/ROXICET) 5-325 MG tablet  Has the patient contacted their pharmacy? Yes (Agent: If no, request that the patient contact the pharmacy for the refill. If patient does not wish to contact the pharmacy document the reason why and proceed with request.) (Agent: If yes, when and what did the pharmacy advise?)  This is the patient's preferred pharmacy:  [facility]  [address] [phone]   Is this the correct pharmacy for this prescription? Yes If no, delete pharmacy and type the correct one.   Has the prescription been filled recently? No  Is the patient out of the medication? Yes  Has the patient been seen for an appointment in the last year OR does the patient have an upcoming appointment? Yes  Can we respond through MyChart? Yes  Agent: Please be advised that Rx refills may take up to 3 business days. We ask that you follow-up with your pharmacy.

## 2023-10-30 NOTE — Telephone Encounter (Signed)
 Copied from CRM [id number]. Topic: Clinical - Medication Refill >> Oct 29, 2023  3:08 PM Alfonso ORN wrote: Dr. ONEIDA patient calling on status of the medication refill oxyCODONE -acetaminophen  (PERCOCET/ROXICET) 5-325 MG tablet

## 2023-10-31 ENCOUNTER — Other Ambulatory Visit: Payer: Self-pay

## 2023-10-31 ENCOUNTER — Telehealth: Payer: Self-pay | Admitting: Sports Medicine

## 2023-10-31 DIAGNOSIS — M48061 Spinal stenosis, lumbar region without neurogenic claudication: Secondary | ICD-10-CM

## 2023-10-31 MED ORDER — OXYCODONE-ACETAMINOPHEN 5-325 MG PO TABS: 1.0000 | ORAL_TABLET | Freq: Four times a day (QID) | ORAL | 0 refills | Status: DC | PRN

## 2023-10-31 NOTE — Telephone Encounter (Signed)
 Copied from CRM [id number]. Topic: Clinical - Medication Refill >> Oct 31, 2023 10:28 AM Miquel SAILOR wrote: Medication: oxyCODONE -acetaminophen  (PERCOCET/ROXICET) 5-325 MG tablet  Patient needs update on medication due to out of medication. Need this ASAP. Office instructed due to Dr. ONEIDA on vacation they are putting in to him as high priority to PT. Let Pt know it may take up to 3 days

## 2023-11-08 ENCOUNTER — Other Ambulatory Visit: Payer: Self-pay | Admitting: Sports Medicine

## 2023-11-08 DIAGNOSIS — E785 Hyperlipidemia, unspecified: Secondary | ICD-10-CM

## 2023-11-19 ENCOUNTER — Ambulatory Visit (INDEPENDENT_AMBULATORY_CARE_PROVIDER_SITE_OTHER): Admitting: Sports Medicine

## 2023-11-19 ENCOUNTER — Encounter: Payer: Self-pay | Admitting: Sports Medicine

## 2023-11-19 VITALS — BP 134/74 | HR 73 | Temp 98.1°F | Resp 20 | Ht 74.0 in | Wt 280.0 lb

## 2023-11-19 DIAGNOSIS — M48061 Spinal stenosis, lumbar region without neurogenic claudication: Secondary | ICD-10-CM

## 2023-11-19 DIAGNOSIS — E119 Type 2 diabetes mellitus without complications: Secondary | ICD-10-CM | POA: Diagnosis not present

## 2023-11-19 DIAGNOSIS — Z1211 Encounter for screening for malignant neoplasm of colon: Secondary | ICD-10-CM | POA: Diagnosis not present

## 2023-11-19 DIAGNOSIS — Z7901 Long term (current) use of anticoagulants: Secondary | ICD-10-CM | POA: Diagnosis not present

## 2023-11-19 DIAGNOSIS — Z0000 Encounter for general adult medical examination without abnormal findings: Secondary | ICD-10-CM

## 2023-11-19 MED ORDER — PHENTERMINE HCL 37.5 MG PO TABS: ORAL_TABLET | ORAL | 0 refills | Status: DC

## 2023-11-19 NOTE — Assessment & Plan Note (Signed)
 Rechecking all diabetic labs.  Historically Jeff Green has declined additional treatments to improve his A1c, he was aware of the risks and we will respect his autonomy.  Update: A1c uncontrolled, restarting glipizide , patient self discontinued.  Recheck A1c in 3 months

## 2023-11-19 NOTE — Assessment & Plan Note (Addendum)
 Medicare physical as above. Checking routine labs. Due for Cologuard. Return in a year. He does have another follow-up coming up in a month for his weight, we can discuss pneumococcal 20 vaccination at that time.

## 2023-11-19 NOTE — Assessment & Plan Note (Signed)
 History of a single level decompression with Dr. Carolee in [place], he was working with Dr. Sharleen V were preferred pain management and occasional epidurals. It sounds like he had a fall recently and was referred to the spine center at [facility].

## 2023-11-19 NOTE — Progress Notes (Signed)
 Subjective:   Jeff Green is a 68 y.o. male who presents for Medicare Annual/Subsequent preventive examination.  Visit Complete: In person  Patient Medicare AWV questionnaire completed verbally in person.     Objective:    Today's Vitals   11/19/23 0908  BP: 134/74  Pulse: 73  Resp: 20  Temp: 98.1 F (36.7 C)  TempSrc: Oral  SpO2: 95%  Weight: 280 lb (127 kg)  Height: 6' 2 (1.88 m)   Body mass index is 35.95 kg/m.     07/03/2021    1:04 PM 06/09/2021    6:00 PM 05/29/2021   10:20 AM 08/12/2018   12:46 PM 07/10/2018    9:40 AM 03/27/2017    2:14 PM 08/31/2016    9:39 AM  Advanced Directives  Does Patient Have a Medical Advance Directive? No No No No No  No  No   Would patient like information on creating a medical advance directive? No - Patient declined No - Patient declined   No - Patient declined  No - Patient declined  No - Patient declined      Data saved with a previous flowsheet row definition    Current Medications (verified) Outpatient Encounter Medications as of 11/19/2023  Medication Sig   amitriptyline  (ELAVIL ) 50 MG tablet TAKE 1/2 TABLET BY MOUTH AT BEDTIME FOR 1 WEEK, THEN 1 TABLET  AT BEDTIME   carvedilol  (COREG ) 25 MG tablet TAKE 1 TABLET BY MOUTH TWICE A DAY WITH MEALS   diltiazem  (CARDIZEM  CD) 120 MG 24 hr capsule TAKE 1 CAPSULE BY MOUTH DAILY   fenofibrate  160 MG tablet TAKE 1 TABLET BY MOUTH DAILY (APPOINTMENT IS NEEDED FOR FURTHER REFILLS)   glipiZIDE  (GLUCOTROL ) 10 MG tablet Take 1 tablet (10 mg total) by mouth 2 (two) times daily before a meal.   Multiple Vitamin (MULTIVITAMIN) capsule Take 1 capsule by mouth daily.   NEEDLE, DISP, 18 G 18G X 1-1/2 MISC Use as needed for testosterone  injections   oxyCODONE -acetaminophen  (PERCOCET/ROXICET) 5-325 MG tablet Take 1-2 tablets by mouth every 6 (six) hours as needed for severe pain (pain score 7-10).   phentermine  (ADIPEX-P ) 37.5 MG tablet One tab by mouth qAM   rosuvastatin  (CRESTOR ) 40 MG  tablet Take 1 tablet (40 mg total) by mouth daily. NEEDS APPOINTMENT FOR FURTHER REFILLS.   Syringe/Needle, Disp, (SYRINGE 3CC/22GX1) 22G X 1 3 ML MISC Use as needed for testosterone  injections   testosterone  cypionate (DEPOTESTOSTERONE CYPIONATE) 200 MG/ML injection 1mL IM every other week.   tiZANidine  (ZANAFLEX ) 2 MG tablet Take 1 tablet (2 mg total) by mouth every 8 (eight) hours as needed for muscle spasms.   topiramate  (TOPAMAX ) 100 MG tablet TAKE ONE TABLET BY MOUTH TWICE A DAY   warfarin (COUMADIN ) 7.5 MG tablet TAKE 1 TABLET BY MOUTH DAILY   Biotin 5000 MCG CAPS Take 5,000 mcg by mouth daily.   Coenzyme Q10 (COQ-10 PO) Take 200 mg by mouth daily. (Patient not taking: Reported on 11/19/2023)   docusate sodium  (COLACE) 100 MG capsule Take 1 capsule (100 mg total) by mouth 2 (two) times daily. (Patient not taking: Reported on 11/19/2023)   GLUCOSAMINE CHONDROITIN COMPLX PO Take 2 tablets by mouth daily. (Patient not taking: Reported on 11/19/2023)   Probiotic Product (PROBIOTIC DAILY PO) Take 1 capsule by mouth. (Patient not taking: Reported on 11/19/2023)   [DISCONTINUED] oxyCODONE -acetaminophen  (PERCOCET/ROXICET) 5-325 MG tablet Take 1-2 tablets by mouth every 6 (six) hours as needed for severe pain (pain score 7-10).  No facility-administered encounter medications on file as of 11/19/2023.    Allergies (verified) Lipitor [atorvastatin ]   History: Past Medical History:  Diagnosis Date   -+    Stent placed; [place]    Arthritis    Atrial fibrillation (HCC)    Cancer (HCC)    testicular cancer   Cataract, left eye    Congestive heart failure (CHF) (HCC)    DVT (deep venous thrombosis) (HCC)    High cholesterol    Pneumonia    Psychosis (HCC)    Pulmonary embolus (HCC)    Past Surgical History:  Procedure Laterality Date   HERNIA REPAIR     IVC  2014   lamenectomy     left orchiectomy     TOTAL HIP ARTHROPLASTY Left 06/09/2021   Procedure: TOTAL HIP  ARTHROPLASTY ANTERIOR APPROACH;  Surgeon: Yvone Rush, MD;  Location: [facility];  Service: Orthopedics;  Laterality: Left;   Family History  Problem Relation Age of Onset   Thyroid  cancer Mother    Depression Mother    High Cholesterol Father    High blood pressure Father    Heart disease Father        Atrial fibrillation   Stroke Other        Grandfather   Social History   Socioeconomic History   Marital status: Married    Spouse name: Not on file   Number of children: 5   Years of education: Not on file   Highest education level: 12th grade  Occupational History   Not on file  Tobacco Use   Smoking status: Former    Current packs/day: 0.00    Types: Cigarettes    Quit date: 08/10/1999    Years since quitting: 24.2   Smokeless tobacco: Never  Vaping Use   Vaping status: Never Used  Substance and Sexual Activity   Alcohol use: Yes    Alcohol/week: 0.0 standard drinks of alcohol    Comment: Rare   Drug use: No   Sexual activity: Yes    Partners: Female  Other Topics Concern   Not on file  Social History Narrative   Not on file   Social Drivers of Health   Financial Resource Strain: Low Risk  (11/15/2023)   Overall Financial Resource Strain (CARDIA)    Difficulty of Paying Living Expenses: Not hard at all  Food Insecurity: No Food Insecurity (11/15/2023)   Hunger Vital Sign    Worried About Running Out of Food in the Last Year: Never true    Ran Out of Food in the Last Year: Never true  Transportation Needs: No Transportation Needs (11/15/2023)   PRAPARE - Administrator, Civil Service (Medical): No    Lack of Transportation (Non-Medical): No  Physical Activity: Unknown (11/15/2023)   Exercise Vital Sign    Days of Exercise per Week: Patient declined    Minutes of Exercise per Session: Not on file  Stress: No Stress Concern Present (11/15/2023)   [employer] of Occupational Health - Occupational Stress Questionnaire    Feeling of Stress: Only a  little  Social Connections: Unknown (11/15/2023)   Social Connection and Isolation Panel    Frequency of Communication with Friends and Family: More than three times a week    Frequency of Social Gatherings with Friends and Family: Twice a week    Attends Religious Services: Not on Marketing executive or Organizations: Yes    Attends [employer]  or Organization Meetings: More than 4 times per year    Marital Status: Married    Tobacco Counseling Counseling given: Not Answered     Activities of Daily Living Able to complete all ADLs and IADLs independently.  Patient Care Team: Curtis Debby PARAS, MD as PCP - General (Sports Medicine) Pietro Redell RAMAN, MD as PCP - Cardiology (Cardiology) Roche, Ozell PARAS, PA-C as Consulting Physician (Pain Medicine)  Indicate any recent Medical Services you may have received from other than [facility] providers in the past year (date may be approximate).     Assessment:   This is a routine wellness examination for Jeff Green.  Hearing/Vision screen No results found.   Goals Addressed   None    Depression Screen    11/19/2023    9:35 AM 04/25/2022    9:51 AM 03/21/2020   11:51 AM 10/04/2017   10:31 AM 03/21/2017    2:24 PM  PHQ 2/9 Scores  PHQ - 2 Score 0 0 0 0 3  PHQ- 9 Score    3 9    Fall Risk    11/19/2023    9:36 AM  Fall Risk   Falls in the past year? 1  Number falls in past yr: 0  Injury with Fall? 1  Risk for fall due to : Impaired balance/gait  Follow up Falls evaluation completed;Falls prevention discussed;Education provided;Follow up appointment    Cognitive Function: Cognitive function is adequate.  Immunizations Immunization History  Administered Date(s) Administered   Pneumococcal Polysaccharide-23 06/08/2016   Tdap 10/18/2016, 04/25/2022   Zoster Recombinant(Shingrix) 02/17/2021, 05/16/2021    TDAP status: Up to date  Flu Vaccine status: Up to date  Pneumococcal vaccine status: Declined,  Education  has been provided regarding the importance of this vaccine but patient still declined. Advised may receive this vaccine at local pharmacy or Health Dept. Aware to provide a copy of the vaccination record if obtained from local pharmacy or Health Dept. Verbalized acceptance and understanding.   Covid-19 vaccine status: Declined, Education has been provided regarding the importance of this vaccine but patient still declined. Advised may receive this vaccine at local pharmacy or Health Dept.or vaccine clinic. Aware to provide a copy of the vaccination record if obtained from local pharmacy or Health Dept. Verbalized acceptance and understanding.  Qualifies for Shingles Vaccine? Yes    Shingrix Completed?: Yes  Screening Tests Health Maintenance  Topic Date Due   COVID-19 Vaccine (1) Never done   Pneumococcal Vaccine: 50+ Years (2 of 2 - PCV) 06/08/2017   FOOT EXAM  03/21/2021   OPHTHALMOLOGY EXAM  06/05/2023   Fecal DNA (Cologuard)  06/13/2023   INFLUENZA VACCINE  11/22/2023   HEMOGLOBIN A1C  05/21/2024   Diabetic kidney evaluation - eGFR measurement  11/18/2024   Diabetic kidney evaluation - Urine ACR  11/18/2024   Medicare Annual Wellness (AWV)  11/18/2024   DTaP/Tdap/Td (3 - Td or Tdap) 04/25/2032   Hepatitis C Screening  Completed   HIV Screening  Completed   Zoster Vaccines- Shingrix  Completed   Hepatitis B Vaccines  Aged Out   HPV VACCINES  Aged Out   Meningococcal B Vaccine  Aged Out   Colonoscopy  Discontinued    Health Maintenance  Health Maintenance Due  Topic Date Due   COVID-19 Vaccine (1) Never done   Pneumococcal Vaccine: 50+ Years (2 of 2 - PCV) 06/08/2017   FOOT EXAM  03/21/2021   OPHTHALMOLOGY EXAM  06/05/2023   Fecal DNA (Cologuard)  06/13/2023     Plan:    Annual physical exam Medicare physical as above. Checking routine labs. Due for Cologuard. Return in a year. He does have another follow-up coming up in a month for his weight, we can discuss  pneumococcal 20 vaccination at that time.  Controlled type 2 diabetes mellitus without complication, without long-term current use of insulin (HCC) Rechecking all diabetic labs.  Historically Rich has declined additional treatments to improve his A1c, he was aware of the risks and we will respect his autonomy.  Update: A1c uncontrolled, restarting glipizide , patient self discontinued.  Recheck A1c in 3 months  Morbid obesity (HCC) Patient does desire to try phentermine  for another month, will call in 1 month. I have suggested a GLP-1 instead, we will discuss this at the follow-up visit in a month.  Spinal stenosis of lumbar region History of a single level decompression with Dr. Carolee in [place], he was working with Dr. Sharleen V were preferred pain management and occasional epidurals. It sounds like he had a fall recently and was referred to the spine center at [facility].   Anticoagulated on Coumadin  INR goal 2.5-3.5, continue Coumadin , he does need to come in more often for INR checks. We can revisit this at his 1 month follow-up for weight.   I have personally reviewed and noted the following in the patient's chart:   Medical and social history Use of alcohol, tobacco or illicit drugs  Functional ability and status Nutritional status Physical activity Advanced directives List of other physicians Hospitalizations, surgeries, and ER visits in previous 12 months Vitals Screenings to include cognitive, depression, and falls Referrals and appointments  In addition, I have reviewed and discussed with patient certain preventive protocols, quality metrics, and best practice recommendations. A written personalized care plan for preventive services as well as general preventive health recommendations were provided to patient.    ___________________________________________ Debby PARAS. Curtis, M.D., ABFM., CAQSM., AME. Primary Care and Sports Medicine [facility]  [facility]  Adjunct Instructor of Family Medicine  [employer]  Restaurant manager, fast food

## 2023-11-19 NOTE — Assessment & Plan Note (Signed)
 INR goal 2.5-3.5, continue Coumadin , he does need to come in more often for INR checks. We can revisit this at his 1 month follow-up for weight.

## 2023-11-19 NOTE — Assessment & Plan Note (Signed)
 Patient does desire to try phentermine  for another month, will call in 1 month. I have suggested a GLP-1 instead, we will discuss this at the follow-up visit in a month.

## 2023-11-20 ENCOUNTER — Ambulatory Visit: Payer: Self-pay | Admitting: Sports Medicine

## 2023-11-20 LAB — LIPID PANEL
Chol/HDL Ratio: 3.6 ratio (ref 0.0–5.0)
Cholesterol, Total: 170 mg/dL (ref 100–199)
HDL: 47 mg/dL (ref >39)
LDL Chol Calc (NIH): 76 mg/dL (ref 0–99)
Triglycerides: 294 mg/dL — ABNORMAL HIGH (ref 0–149)
VLDL Cholesterol Cal: 47 mg/dL — ABNORMAL HIGH (ref 5–40)

## 2023-11-20 LAB — COMPREHENSIVE METABOLIC PANEL WITH GFR
ALT: 25 IU/L (ref 0–44)
AST: 26 IU/L (ref 0–40)
Albumin: 4.6 g/dL (ref 3.9–4.9)
Alkaline Phosphatase: 115 IU/L (ref 44–121)
BUN/Creatinine Ratio: 17 (ref 10–24)
BUN: 27 mg/dL (ref 8–27)
Bilirubin Total: 0.4 mg/dL (ref 0.0–1.2)
CO2: 21 mmol/L (ref 20–29)
Calcium: 10.6 mg/dL — ABNORMAL HIGH (ref 8.6–10.2)
Chloride: 100 mmol/L (ref 96–106)
Creatinine, Ser: 1.6 mg/dL — ABNORMAL HIGH (ref 0.76–1.27)
Globulin, Total: 2.4 g/dL (ref 1.5–4.5)
Glucose: 209 mg/dL — ABNORMAL HIGH (ref 70–99)
Potassium: 4.6 mmol/L (ref 3.5–5.2)
Sodium: 137 mmol/L (ref 134–144)
Total Protein: 7 g/dL (ref 6.0–8.5)
eGFR: 48 mL/min/1.73 — ABNORMAL LOW (ref >59)

## 2023-11-20 LAB — PROTIME-INR
INR: 3.3 — ABNORMAL HIGH (ref 0.9–1.2)
Prothrombin Time: 32.9 s — ABNORMAL HIGH (ref 9.1–12.0)

## 2023-11-20 LAB — MICROALBUMIN / CREATININE URINE RATIO
Creatinine, Urine: 274.3 mg/dL
Microalb/Creat Ratio: 17 mg/g{creat} (ref 0–29)
Microalbumin, Urine: 46 ug/mL

## 2023-11-20 LAB — CBC
Hematocrit: 47.2 % (ref 37.5–51.0)
Hemoglobin: 15.5 g/dL (ref 13.0–17.7)
MCH: 31.3 pg (ref 26.6–33.0)
MCHC: 32.8 g/dL (ref 31.5–35.7)
MCV: 95 fL (ref 79–97)
Platelets: 155 x10E3/uL (ref 150–450)
RBC: 4.95 x10E6/uL (ref 4.14–5.80)
RDW: 13.6 % (ref 11.6–15.4)
WBC: 6.4 x10E3/uL (ref 3.4–10.8)

## 2023-11-20 LAB — HEMOGLOBIN A1C
Est. average glucose Bld gHb Est-mCnc: 223 mg/dL
Hgb A1c MFr Bld: 9.4 % — ABNORMAL HIGH (ref 4.8–5.6)

## 2023-11-20 LAB — TSH: TSH: 3.24 u[IU]/mL (ref 0.450–4.500)

## 2023-11-21 MED ORDER — GLIPIZIDE 10 MG PO TABS: 10.0000 mg | ORAL_TABLET | Freq: Two times a day (BID) | ORAL | 3 refills | Status: DC

## 2023-11-21 NOTE — Addendum Note (Signed)
 Addended by: CURTIS DEBBY PARAS on: 11/21/2023 11:52 AM   Modules accepted: Orders

## 2023-11-30 ENCOUNTER — Other Ambulatory Visit: Payer: Self-pay | Admitting: Sports Medicine

## 2023-11-30 DIAGNOSIS — E785 Hyperlipidemia, unspecified: Secondary | ICD-10-CM

## 2023-12-02 ENCOUNTER — Encounter: Payer: Self-pay | Admitting: Sports Medicine

## 2023-12-02 ENCOUNTER — Ambulatory Visit (INDEPENDENT_AMBULATORY_CARE_PROVIDER_SITE_OTHER): Admitting: Sports Medicine

## 2023-12-02 DIAGNOSIS — T63421A Toxic effect of venom of ants, accidental (unintentional), initial encounter: Secondary | ICD-10-CM

## 2023-12-02 DIAGNOSIS — E785 Hyperlipidemia, unspecified: Secondary | ICD-10-CM

## 2023-12-02 MED ORDER — TRIAMCINOLONE ACETONIDE 0.5 % EX OINT: 1.0000 | TOPICAL_OINTMENT | Freq: Two times a day (BID) | CUTANEOUS | 3 refills | Status: DC

## 2023-12-02 MED ORDER — OXYCODONE-ACETAMINOPHEN 5-325 MG PO TABS: 1.0000 | ORAL_TABLET | Freq: Four times a day (QID) | ORAL | 0 refills | Status: DC | PRN

## 2023-12-02 MED ORDER — ROSUVASTATIN CALCIUM 40 MG PO TABS: 40.0000 mg | ORAL_TABLET | Freq: Every day | ORAL | 3 refills | Status: AC

## 2023-12-02 MED ORDER — DOXYCYCLINE HYCLATE 100 MG PO TABS: 100.0000 mg | ORAL_TABLET | Freq: Two times a day (BID) | ORAL | 0 refills | Status: AC

## 2023-12-02 NOTE — Assessment & Plan Note (Signed)
 2 to 3 days status post multiple bites by fire ants on the right foot and lower leg. He accidentally stepped onto an ant hill, they swarmed his leg and bit him multiple times, he was able to brush them all off. Unfortunately he developed mild nausea that has since improved, no fevers, chills but he does have significant itching and pain right lower extremity. On examination I do see multiple bite some of them formed into pustules. We will do a course of doxycycline , topical triamcinolone  ointment. Return to see me if not doing significantly better by a week or so.

## 2023-12-02 NOTE — Progress Notes (Signed)
    Procedures performed today:    None.  Independent interpretation of notes and tests performed by another provider:   None.  Brief History, Exam, Impression, and Recommendations:    Bite, fire ant 2 to 3 days status post multiple bites by fire ants on the right foot and lower leg. He accidentally stepped onto an ant hill, they swarmed his leg and bit him multiple times, he was able to brush them all off. Unfortunately he developed mild nausea that has since improved, no fevers, chills but he does have significant itching and pain right lower extremity. On examination I do see multiple bite some of them formed into pustules. We will do a course of doxycycline , topical triamcinolone  ointment. Return to see me if not doing significantly better by a week or so.    ____________________________________________ Debby PARAS. Curtis, M.D., ABFM., CAQSM., AME. Primary Care and Sports Medicine [facility]  Adjunct Professor of [facility] Medicine  [employer]  Restaurant manager, fast food

## 2023-12-17 ENCOUNTER — Ambulatory Visit (INDEPENDENT_AMBULATORY_CARE_PROVIDER_SITE_OTHER): Admitting: Urgent Care

## 2023-12-17 ENCOUNTER — Encounter: Payer: Self-pay | Admitting: Urgent Care

## 2023-12-17 VITALS — BP 116/73 | HR 74 | Resp 17 | Ht 74.0 in | Wt 281.2 lb

## 2023-12-17 DIAGNOSIS — Z1211 Encounter for screening for malignant neoplasm of colon: Secondary | ICD-10-CM | POA: Diagnosis not present

## 2023-12-17 DIAGNOSIS — I480 Paroxysmal atrial fibrillation: Secondary | ICD-10-CM | POA: Diagnosis not present

## 2023-12-17 DIAGNOSIS — Z7901 Long term (current) use of anticoagulants: Secondary | ICD-10-CM | POA: Diagnosis not present

## 2023-12-17 DIAGNOSIS — E291 Testicular hypofunction: Secondary | ICD-10-CM

## 2023-12-17 DIAGNOSIS — Z7984 Long term (current) use of oral hypoglycemic drugs: Secondary | ICD-10-CM

## 2023-12-17 DIAGNOSIS — M48061 Spinal stenosis, lumbar region without neurogenic claudication: Secondary | ICD-10-CM | POA: Diagnosis not present

## 2023-12-17 DIAGNOSIS — E119 Type 2 diabetes mellitus without complications: Secondary | ICD-10-CM

## 2023-12-17 DIAGNOSIS — N289 Disorder of kidney and ureter, unspecified: Secondary | ICD-10-CM | POA: Diagnosis not present

## 2023-12-17 DIAGNOSIS — Z79890 Hormone replacement therapy (postmenopausal): Secondary | ICD-10-CM

## 2023-12-17 DIAGNOSIS — Z86711 Personal history of pulmonary embolism: Secondary | ICD-10-CM | POA: Diagnosis not present

## 2023-12-17 DIAGNOSIS — E785 Hyperlipidemia, unspecified: Secondary | ICD-10-CM

## 2023-12-17 MED ORDER — PHENTERMINE HCL 37.5 MG PO TABS: ORAL_TABLET | ORAL | 0 refills | Status: DC

## 2023-12-17 MED ORDER — OXYCODONE-ACETAMINOPHEN 5-325 MG PO TABS: 1.0000 | ORAL_TABLET | Freq: Four times a day (QID) | ORAL | 0 refills | Status: DC | PRN

## 2023-12-17 MED ORDER — LANCET DEVICE MISC: 0 refills | Status: DC

## 2023-12-17 MED ORDER — BLOOD GLUCOSE MONITORING SUPPL DEVI: 1.0000 | Freq: Three times a day (TID) | 0 refills | Status: DC

## 2023-12-17 MED ORDER — LANCETS MISC. MISC: 0 refills | Status: DC

## 2023-12-17 MED ORDER — BLOOD GLUCOSE TEST VI STRP
ORAL_STRIP | 0 refills | Status: AC
Start: 2023-12-17 — End: ?

## 2023-12-17 NOTE — Patient Instructions (Addendum)
 Continue all your home medications as ordered. Please return at 8am on 12/20/23 at 8am for fasting lab draw.  I have refilled your prescriptions.  Please start monitoring your glucose levels several times weekly. Please check your glucose and write it down for me: Fasting goal: <120 1 hour after eating: <180 2 hours after eating: <140   Return for an office visit in person in 3 months

## 2023-12-17 NOTE — Progress Notes (Addendum)
 Established Patient Office Visit  Subjective:  Patient ID: Jeff Green, male    DOB: April 21, 1956  Age: 68 y.o. MRN: [id number]  Chief Complaint  Patient presents with   Medical Management of Chronic Issues    4 wk T2DM and weight loss f/u pt states he doesn't know if the new meds he started last OV is affecting the phentermine  because he was dwn 5lbs but it came right back and he gained an extra lb    HPI  Discussed the use of AI scribe software for clinical note transcription with the patient, who gave verbal consent to proceed.  History of Present Illness   Jeff Green is a 68 year old male who presents with concerns about weight gain and medication management.  He started phentermine  approximately four weeks ago to aid in weight loss and metabolism jumpstart. Despite this, he has gained a pound, which he finds surprising. He previously used phentermine  years ago, which helped him lose weight from 330 pounds to 280 pounds, and he is currently seeking to reduce his weight further.  He was on prednisone  about a month ago following an incident with red ants, which affected his sleep. He suspects prednisone  may have contributed to his weight gain. He reports difficulty sleeping, which he attributes to prednisone  use. He also mentions that his mouth is sometimes dry.  He is currently taking glipizide  for diabetes management. He reports that his A1c levels have increased over the past four years. He was hospitalized for COVID-19 during this period and experienced a prolonged period of inactivity. He does not currently monitor his blood sugar at home but is open to starting.  He has a history of pulmonary embolism and atrial fibrillation, for which he is on Coumadin . He experienced a blood clot after a long flight, which led to a pulmonary embolism. He follows up with a Coumadin  clinic regularly. He is not interested in other anticoagulants.  He is on testosterone  injections,  which he has been administering himself for about ten years. He notes that higher doses previously led to weight gain and increased aggression, so he has adjusted his dosage accordingly. He is taking 1mL every 2 weeks. He has not had his T levels checked recently.  He has been prescribed oxycodone  for shoulder pain, which he takes as needed, usually one to two tablets a day.  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 He moved from [place] and has been active in his community, including doing work for his church.       Patient Active Problem List   Diagnosis Date Noted   Bite, fire ant 12/02/2023   Chronic rhinitis 10/02/2022   Primary osteoarthritis of left hip 06/09/2021   Anticoagulated on Coumadin  05/16/2021   Left hip pain 02/17/2021   Primary osteoarthritis of left knee 09/06/2020   H/O hernia repair 06/20/2020   COVID-19 lung 01/20/2020   Thrombocytopenia (HCC) 08/12/2018   Liver lesion 08/12/2018   Lung nodule 08/12/2018   Presence of IVC filter 08/06/2018   DVT (deep venous thrombosis) (HCC) 07/29/2018   Renal insufficiency 06/27/2018   Insomnia 04/05/2017   OSA (obstructive sleep apnea) 03/21/2017   Primary osteoarthritis of both shoulders 11/28/2016   Controlled type 2 diabetes mellitus without complication, without long-term current use of insulin (HCC) 06/08/2016   Morbid obesity (HCC) 06/08/2016   Annual physical exam 01/25/2016   Paroxysmal atrial fibrillation (HCC) 06/15/2015   Polycythemia, secondary 08/26/2014   Hemochromatosis 08/26/2014  Multiple pulmonary nodules 08/16/2014   Testicular cancer (HCC) 08/12/2014   Hyperlipidemia 08/10/2014   Hypogonadism in male 08/10/2014   Essential hypertension 08/10/2014   Spinal stenosis of lumbar region 08/10/2014   History of pulmonary embolus (PE) 08/10/2014   CAD (coronary artery disease), native coronary artery 08/10/2014   Past Medical History:  Diagnosis Date   -+    St[place] placed; [place]  [place]    Arthritis    Atrial fibrillation (HCC)    Cancer (HCC)    testicular cancer   Cataract, left eye    Congestive heart failure (CHF) (HCC)    DVT (deep venous thrombosis) (HCC)    High cholesterol    Pneumonia    Psychosis (HCC)    Pulmonary embolus (HCC)    Past Surgical History:  Procedure Laterality Date   HERNIA REPAIR     IVC  2014   lamenectomy     left orchiectomy     TOTAL HIP ARTHROPLASTY Left 06/09/2021   Procedure: TOTAL HIP ARTHROPLASTY ANTERIOR APPROACH;  Surgeon: Yvone Rush, MD;  Location: [facility];  Service: Orthopedics;  Laterality: Left;   Social History   Tobacco Use   Smoking status: Former    Current packs/day: 0.00    Types: Cigarettes    Quit date: 08/10/1999    Years since quitting: 24.3   Smokeless tobacco: Never  Vaping Use   Vaping status: Never Used  Substance Use Topics   Alcohol use: Yes    Alcohol/week: 0.0 standard drinks of alcohol    Comment: Rare   Drug use: No      ROS: as noted in HPI  Objective:     BP 116/73   Pulse 74   Resp 17   Ht 6' 2 (1.88 m)   Wt 281 lb 4 oz (127.6 kg)   SpO2 96%   BMI 36.11 kg/m  BP Readings from Last 3 Encounters:  12/17/23 116/73  11/19/23 134/74  07/18/21 114/72   Wt Readings from Last 3 Encounters:  12/17/23 281 lb 4 oz (127.6 kg)  11/19/23 280 lb (127 kg)  06/09/21 285 lb 8 oz (129.5 kg)      Physical Exam Vitals and nursing note reviewed.  Constitutional:      General: He is not in acute distress.    Appearance: Normal appearance. He is not ill-appearing, toxic-appearing or diaphoretic.  HENT:     Head: Normocephalic and atraumatic.     Right Ear: External ear normal.     Left Ear: External ear normal.     Nose: Nose normal.     Mouth/Throat:     Mouth: Mucous membranes are moist.     Pharynx: Oropharynx is clear. No posterior oropharyngeal erythema.  Eyes:     General: No scleral icterus.       Right eye: No discharge.        Left eye: No discharge.      Pupils: Pupils are equal, round, and reactive to light.  Neck:     Thyroid : No thyroid  mass, thyromegaly or thyroid  tenderness.  Cardiovascular:     Rate and Rhythm: Normal rate.     Heart sounds: No murmur heard. Pulmonary:     Effort: Pulmonary effort is normal. No respiratory distress.     Breath sounds: Normal breath sounds. No stridor. No wheezing or rhonchi.  Musculoskeletal:     Cervical back: Normal range of motion and neck supple. No rigidity or tenderness.  Feet:  Right foot:     Protective Sensation: 10 sites tested.  10 sites sensed.     Skin integrity: Skin integrity normal. No ulcer, blister, skin breakdown, erythema, warmth or callus.     Left foot:     Protective Sensation: 10 sites tested.  10 sites sensed.     Skin integrity: Skin integrity normal. No ulcer, blister, skin breakdown, erythema, warmth or callus.  Lymphadenopathy:     Cervical: No cervical adenopathy.  Skin:    General: Skin is warm and dry.     Coloration: Skin is not jaundiced.     Findings: No bruising, erythema or rash.  Neurological:     General: No focal deficit present.     Mental Status: He is alert and oriented to person, place, and time.      No results found for any visits on 12/17/23.  Last CBC Lab Results  Component Value Date   WBC 6.4 11/19/2023   HGB 15.5 11/19/2023   HCT 47.2 11/19/2023   MCV 95 11/19/2023   MCH 31.3 11/19/2023   RDW 13.6 11/19/2023   PLT 155 11/19/2023   Last metabolic panel Lab Results  Component Value Date   GLUCOSE 209 (H) 11/19/2023   NA 137 11/19/2023   K 4.6 11/19/2023   CL 100 11/19/2023   CO2 21 11/19/2023   BUN 27 11/19/2023   CREATININE 1.60 (H) 11/19/2023   EGFR 48 (L) 11/19/2023   CALCIUM  10.6 (H) 11/19/2023   PROT 7.0 11/19/2023   ALBUMIN 4.6 11/19/2023   LABGLOB 2.4 11/19/2023   BILITOT 0.4 11/19/2023   ALKPHOS 115 11/19/2023   AST 26 11/19/2023   ALT 25 11/19/2023   ANIONGAP 8 05/29/2021   Last lipids Lab Results   Component Value Date   CHOL 170 11/19/2023   HDL 47 11/19/2023   LDLCALC 76 11/19/2023   TRIG 294 (H) 11/19/2023   CHOLHDL 3.6 11/19/2023   Last hemoglobin A1c Lab Results  Component Value Date   HGBA1C 9.4 (H) 11/19/2023   Last thyroid  functions Lab Results  Component Value Date   TSH 3.240 11/19/2023   Last vitamin D  Lab Results  Component Value Date   VD25OH 50 06/09/2014   Last vitamin B12 and Folate Lab Results  Component Value Date   VITAMINB12 795 09/27/2020      The 10-year ASCVD risk score (Arnett DK, et al., 2019) is: 21.2%  Assessment & Plan:  Hyperlipidemia, unspecified hyperlipidemia type  Controlled type 2 diabetes mellitus without complication, without long-term current use of insulin (HCC) -     Blood Glucose Monitoring Suppl; 1 each by Does not apply route in the morning, at noon, and at bedtime. May substitute to any manufacturer covered by patient's insurance.  Dispense: 1 each; Refill: 0 -     Blood Glucose Test; For once daily glucose testing. May substitute to any manufacturer covered by patient's insurance.  Dispense: 100 strip; Refill: 0 -     Lancet Device; For once daily glucose testing. May substitute to any manufacturer covered by patient's insurance.  Dispense: 1 each; Refill: 0 -     Lancets Misc.; For once daily glucose testing. May substitute to any manufacturer covered by patient's insurance.  Dispense: 100 each; Refill: 0  Spinal stenosis of lumbar region, unspecified whether neurogenic claudication present -     oxyCODONE -Acetaminophen ; Take 1-2 tablets by mouth every 6 (six) hours as needed for severe pain (pain score 7-10).  Dispense: 40 tablet; Refill: 0  Paroxysmal atrial fibrillation (HCC) -     Protime-INR  Morbid obesity (HCC) -     Phentermine  HCl; One tab by mouth qAM  Dispense: 30 tablet; Refill: 0  History of pulmonary embolus (PE) -     Protime-INR  Long-term current use of testosterone  replacement therapy -      Testosterone ,Free and Total -     PSA, total and free  Hypogonadism in male -     Testosterone ,Free and Total -     PSA, total and free  Renal insufficiency  Anticoagulated on Coumadin  -     Protime-INR  Assessment and Plan    Type 2 diabetes mellitus Type 2 diabetes with elevated A1c. Current glipizide  regimen may not achieve target A1c of 6%. - Continue glipizide  10 mg twice daily. - Check blood glucose at home 2-3 days a week fasting and 2-3 days postprandial. - Reassess A1c in 2 months for potential medication adjustment. - discussed GLP-1 which pt refuses  Obesity Obesity with recent weight gain despite phentermine . Discussed cardiovascular risks and alternative medications. He prefers to continue phentermine . - Continue phentermine . - Monitor heart rate and blood pressure regularly.  Pulmonary embolism and atrial fibrillation Managed with Coumadin  due to history of blood clots and preference for familiar medication. - Check INR Friday. - Continue Coumadin  therapy. - Schedule regular follow-ups with Coumadin  clinic.  Chronic right shoulder pain Managed with oxycodone . He prefers to avoid corticosteroid injections. - Continue oxycodone  5 mg/325 mg as needed, up to 2 tablets per day. - Pt prefers to avoid corticosteroid injections if possible.  Testosterone  deficiency Managed with testosterone  injections. Last level check in 2021. Reports aggression and weight gain with higher doses. - Schedule testosterone  level check on Friday before next injection at 8am. - Continue current testosterone  regimen of 1 mL every other week.   HLD Well controlled, last checked 11/19/23        Return in about 3 months (around 03/18/2024).   Benton LITTIE Gave, PA

## 2023-12-18 ENCOUNTER — Other Ambulatory Visit: Payer: Self-pay | Admitting: Urgent Care

## 2023-12-18 DIAGNOSIS — E119 Type 2 diabetes mellitus without complications: Secondary | ICD-10-CM

## 2023-12-20 DIAGNOSIS — Z86711 Personal history of pulmonary embolism: Secondary | ICD-10-CM | POA: Diagnosis not present

## 2023-12-20 DIAGNOSIS — Z7901 Long term (current) use of anticoagulants: Secondary | ICD-10-CM | POA: Diagnosis not present

## 2023-12-20 DIAGNOSIS — I480 Paroxysmal atrial fibrillation: Secondary | ICD-10-CM | POA: Diagnosis not present

## 2023-12-21 LAB — PROTIME-INR

## 2023-12-24 ENCOUNTER — Telehealth: Payer: Self-pay

## 2023-12-24 ENCOUNTER — Ambulatory Visit: Payer: Self-pay | Admitting: Urgent Care

## 2023-12-24 ENCOUNTER — Encounter: Payer: Self-pay | Admitting: Sports Medicine

## 2023-12-24 DIAGNOSIS — E291 Testicular hypofunction: Secondary | ICD-10-CM

## 2023-12-24 LAB — COLOGUARD: COLOGUARD: POSITIVE — AB

## 2023-12-24 NOTE — Telephone Encounter (Signed)
 would you be able to call pt for me? He came in on Friday for labs... needed INR check. For some reason the lab cancelled this test  [facility] He needs to come back in for INR, today if possible  Attempted call to patient.  Left a voice mail requesting a return call.  Need to scheduled nurse visit for INR check today =kph

## 2023-12-24 NOTE — Telephone Encounter (Signed)
 Patient scheduled for 10:30am tomorrow for INR check .

## 2023-12-25 ENCOUNTER — Ambulatory Visit: Admitting: Urgent Care

## 2023-12-25 VITALS — BP 111/67 | HR 75 | Ht 74.0 in

## 2023-12-25 DIAGNOSIS — Z86711 Personal history of pulmonary embolism: Secondary | ICD-10-CM | POA: Diagnosis not present

## 2023-12-25 DIAGNOSIS — I480 Paroxysmal atrial fibrillation: Secondary | ICD-10-CM | POA: Diagnosis not present

## 2023-12-25 LAB — POCT INR: INR: 3.4 — AB (ref 2.0–3.0)

## 2023-12-25 NOTE — Patient Instructions (Signed)
 changing medication directions to taking 7.5mg  ( 1 tablet) on Monday, Tuesday,  and Friday  and 7.5mg  ( 1/2 tablet  on Wednesday, Thursday, Saturday and Sunday)

## 2023-12-25 NOTE — Progress Notes (Unsigned)
   Established Patient Office Visit  Subjective   Patient ID: Jeff Green, male    DOB: May 18, 1955  Age: 68 y.o. MRN: [id number]  No chief complaint on file.   HPI  {History (Optional):23778}  ROS    Objective:     BP 111/67   Pulse 75   Ht 6' 2 (1.88 m)   SpO2 95%   BMI 36.11 kg/m  {Vitals History (Optional):23777}  Physical Exam   Results for orders placed or performed in visit on 12/25/23  POCT INR  Result Value Ref Range   INR 3.4 (A) 2.0 - 3.0    {Labs (Optional):23779}  The 10-year ASCVD risk score (Arnett DK, et al., 2019) is: 19.8%    Assessment & Plan:  Per Benton Gave -INR level today was 3.4 - changing medication directions to taking 7.5mg  ( 1 tablet) on Monday, Tuesday,  and Friday  and 7.5mg  ( 1/2 tablet  on Wednesday, Thursday, Saturday and Sunday)  Problem List Items Addressed This Visit       Cardiovascular and Mediastinum   Paroxysmal atrial fibrillation (HCC) - Primary (Chronic)     Other   History of pulmonary embolus (PE)    No follow-ups on file.    Suzen SHAUNNA Plenty, LPN

## 2023-12-28 LAB — PSA, TOTAL AND FREE
PSA, Free Pct: 30 %
PSA, Free: 0.03 ng/mL
Prostate Specific Ag, Serum: 0.1 ng/mL (ref 0.0–4.0)

## 2023-12-28 LAB — TESTOSTERONE,FREE AND TOTAL
Testosterone, Free: 0.4 pg/mL — ABNORMAL LOW (ref 6.6–18.1)
Testosterone: 3 ng/dL — ABNORMAL LOW (ref 264–916)

## 2023-12-28 LAB — PROTIME-INR

## 2023-12-30 MED ORDER — TESTOSTERONE CYPIONATE 200 MG/ML IM SOLN: INTRAMUSCULAR | 0 refills | Status: DC

## 2024-01-02 ENCOUNTER — Telehealth: Payer: Self-pay

## 2024-01-02 NOTE — Telephone Encounter (Signed)
 Copied from CRM #[id number]. Topic: Clinical - Medical Advice >> Jan 01, 2024 11:13 AM Diannia H wrote: Reason for CRM: Patient was calling and wants the provider to know that he will continue to give his testosterone  injections himself. He has made his 3 month appointment already.

## 2024-01-02 NOTE — Telephone Encounter (Signed)
 Patient scheduled for Nov 19 f/u with whitney crain. O.k. to change PCP to you?

## 2024-01-02 NOTE — Telephone Encounter (Signed)
 Yes, I am fine taking being his PCP. And great, thanks for scheduling his appointment

## 2024-01-08 ENCOUNTER — Other Ambulatory Visit: Payer: Self-pay | Admitting: *Deleted

## 2024-01-08 DIAGNOSIS — E785 Hyperlipidemia, unspecified: Secondary | ICD-10-CM

## 2024-01-08 MED ORDER — FENOFIBRATE 160 MG PO TABS
160.0000 mg | ORAL_TABLET | Freq: Every day | ORAL | 0 refills | Status: DC
Start: 2024-01-08 — End: 2024-03-11

## 2024-01-13 ENCOUNTER — Other Ambulatory Visit: Payer: Self-pay | Admitting: Urgent Care

## 2024-01-13 MED ORDER — PHENTERMINE HCL 37.5 MG PO TABS: ORAL_TABLET | ORAL | 0 refills | Status: DC

## 2024-01-13 MED ORDER — DILTIAZEM HCL ER COATED BEADS 120 MG PO CP24: 120.0000 mg | ORAL_CAPSULE | Freq: Every day | ORAL | 0 refills | Status: DC

## 2024-01-13 NOTE — Telephone Encounter (Signed)
 Copied from CRM [id number]. Topic: Clinical - Medication Refill >> Jan 13, 2024 12:40 PM Suzette B wrote: Medication: phentermine  (ADIPEX-P ) 37.5 MG tablet  Has the patient contacted their pharmacy? No: this medication needs a call in every month for refill  This is the patient's preferred pharmacy:  [id number] - [place], [place] - [address] [place] [address] Phone: [phone] Fax: [phone]  Is this the correct pharmacy for this prescription? Yes If no, delete pharmacy and type the correct one.   Has the prescription been filled recently? Yes  Is the patient out of the medication? No - 5 left   Has the patient been seen for an appointment in the last year OR does the patient have an upcoming appointment? Yes  Can we respond through MyChart? Yes  Agent: Please be advised that Rx refills may take up to 3 business days. We ask that you follow-up with your pharmacy.

## 2024-01-13 NOTE — Telephone Encounter (Signed)
 Copied from CRM [id number]. Topic: Clinical - Medication Refill >> Jan 13, 2024 12:43 PM Suzette B wrote: Medication: diltiazem  (CARDIZEM  CD) 120 MG 24 hr capsule  Has the patient contacted their pharmacy? No he was just reminded while check his other medications    This is the patient's preferred pharmacy:  [id number] - [place], [place] - [address] [place] [address] Phone: [phone] Fax: [phone]  Is this the correct pharmacy for this prescription? Yes If no, delete pharmacy and type the correct one.   Has the prescription been filled recently? Yes  Is the patient out of the medication? No 7 days left  Has the patient been seen for an appointment in the last year OR does the patient have an upcoming appointment? Yes  Can we respond through MyChart? Yes  Agent: Please be advised that Rx refills may take up to 3 business days. We ask that you follow-up with your pharmacy.

## 2024-01-16 ENCOUNTER — Telehealth: Payer: Self-pay | Admitting: Urgent Care

## 2024-01-16 DIAGNOSIS — M48061 Spinal stenosis, lumbar region without neurogenic claudication: Secondary | ICD-10-CM

## 2024-01-16 MED ORDER — OXYCODONE-ACETAMINOPHEN 5-325 MG PO TABS: 1.0000 | ORAL_TABLET | Freq: Four times a day (QID) | ORAL | 0 refills | Status: DC | PRN

## 2024-01-16 NOTE — Telephone Encounter (Signed)
 Prescription Request  01/16/2024  LOV: 12/25/2023  What is the name of the medication or equipment? oxyCODONE -acetaminophen  (PERCOCET/ROXICET) 5-325 MG tablet   Have you contacted your pharmacy to request a refill? Yes   Which pharmacy would you like this sent to?  [id number] - [place], [place] - [address] [place] [address] Phone: [phone] Fax: [phone]    Patient notified that their request is being sent to the clinical staff for review and that they should receive a response within 2 business days.   Please advise at [facility] [phone]

## 2024-01-16 NOTE — Telephone Encounter (Signed)
 Spoke with pt, he is aware the prescription was sent to the pharmacy as requested.

## 2024-01-17 NOTE — Progress Notes (Signed)
 Jeff Green                                          MRN: [id number]   01/17/2024   The [facility] Quality Team Specialist reviewed this patient medical record for the purposes of chart review for care gap closure. The following were reviewed: chart review for care gap closure-glycemic status assessment.    [facility] Quality Team

## 2024-01-30 ENCOUNTER — Telehealth: Payer: Self-pay

## 2024-01-30 DIAGNOSIS — R195 Other fecal abnormalities: Secondary | ICD-10-CM

## 2024-01-30 NOTE — Telephone Encounter (Signed)
 Yes absolutely! Please do

## 2024-01-30 NOTE — Telephone Encounter (Signed)
 Reviewing patient chart he has a positive Cologuard. If patient is agreeable would it be ok to order a referral to GI?

## 2024-01-31 ENCOUNTER — Other Ambulatory Visit: Payer: Self-pay | Admitting: Urgent Care

## 2024-01-31 ENCOUNTER — Telehealth: Payer: Self-pay

## 2024-01-31 DIAGNOSIS — M48061 Spinal stenosis, lumbar region without neurogenic claudication: Secondary | ICD-10-CM

## 2024-01-31 NOTE — Telephone Encounter (Signed)
 Left message for a return call for a GI referral.

## 2024-01-31 NOTE — Telephone Encounter (Signed)
 Copied from CRM [id number]. Topic: Clinical - Medication Refill >> Jan 31, 2024  3:35 PM [name] wrote: Medication: oxyCODONE -acetaminophen  (PERCOCET/ROXICET) 5-325 MG tablet  Has the patient contacted their pharmacy? Yes, advised to contact to office.   This is the patient's preferred pharmacy:  [id number] - [place], [place] - [address] [place]  [address] Phone: [phone] Fax: [phone]  Is this the correct pharmacy for this prescription? Yes If no, delete pharmacy and type the correct one.   Has the prescription been filled recently? Yes  Is the patient out of the medication? Yes, has 1 pill left  Has the patient been seen for an appointment in the last year OR does the patient have an upcoming appointment? Yes  Can we respond through MyChart? No, prefers phone call at [phone]  Agent: Please be advised that Rx refills may take up to 3 business days. We ask that you follow-up with your pharmacy.

## 2024-01-31 NOTE — Telephone Encounter (Signed)
 Copied from CRM [id number]. Topic: General - Other >> Jan 31, 2024  3:39 PM [name] wrote: Reason for CRM: Patient is calling as requested to advised that he had a Cologard test done this summer, and states results were normal.  Per patient reports results are on mychart.   Patient can be reached for a follow up if need to discuss further, [phone].

## 2024-02-03 ENCOUNTER — Telehealth: Payer: Self-pay

## 2024-02-03 MED ORDER — OXYCODONE-ACETAMINOPHEN 5-325 MG PO TABS
1.0000 | ORAL_TABLET | Freq: Four times a day (QID) | ORAL | 0 refills | Status: DC | PRN
Start: 2024-02-03 — End: 2024-02-17

## 2024-02-03 NOTE — Telephone Encounter (Signed)
 Prescription had already been sent to pharmacy.  Message sent to patient via Mychart with this information.

## 2024-02-03 NOTE — Telephone Encounter (Signed)
 Copied from CRM [id number]. Topic: Clinical - Medication Question >> Feb 03, 2024  4:18 PM Lauren C wrote: Reason for CRM: FYI- Pt calling to let us  know that his oxycodone  has not been refilled yet and he has been completely out, he has birthday plans for tomorrow so is hoping to get a fill as soon as possible. I let him know it can take up to 3 business days and awaiting signature from Crain.

## 2024-02-06 NOTE — Telephone Encounter (Signed)
 Patient agreed to the referral for a colonoscopy. Referral placed.

## 2024-02-06 NOTE — Telephone Encounter (Signed)
 See other message

## 2024-02-14 ENCOUNTER — Other Ambulatory Visit: Payer: Self-pay | Admitting: Family Medicine

## 2024-02-14 ENCOUNTER — Telehealth: Payer: Self-pay | Admitting: Urgent Care

## 2024-02-14 ENCOUNTER — Telehealth: Payer: Self-pay

## 2024-02-14 DIAGNOSIS — M48061 Spinal stenosis, lumbar region without neurogenic claudication: Secondary | ICD-10-CM

## 2024-02-14 NOTE — Telephone Encounter (Signed)
 Patient advised.

## 2024-02-14 NOTE — Telephone Encounter (Signed)
 Copied from CRM [id number]. Topic: Clinical - Medication Refill >> Feb 14, 2024 11:04 AM Emylou G wrote: Medication: phentermine  (ADIPEX-P ) 37.5 MG tablet  Has the patient contacted their pharmacy? No (Agent: If no, request that the patient contact the pharmacy for the refill. If patient does not wish to contact the pharmacy document the reason why and proceed with request.) (Agent: If yes, when and what did the pharmacy advise?)  This is the patient's preferred pharmacy:  [id number] - [place], [place] - [address] [place] [address] Phone: [phone] Fax: [phone]  Is this the correct pharmacy for this prescription? Yes If no, delete pharmacy and type the correct one.   Has the prescription been filled recently? No  Is the patient out of the medication? Yes  Has the patient been seen for an appointment in the last year OR does the patient have an upcoming appointment? Yes  Can we respond through MyChart? No  Agent: Please be advised that Rx refills may take up to 3 business days. We ask that you follow-up with your pharmacy.

## 2024-02-14 NOTE — Telephone Encounter (Signed)
 Rich called and states he only received 20 tablets of the oxycodone  and he should have received 40 tablets.

## 2024-02-14 NOTE — Telephone Encounter (Signed)
 Can you please notify pt that upon further chart review, it appears he has PAF (irregular heart rhythm) and typically phentermine  is not recommended for this condition. He has a follow up with me next months, I'm thinking it may be wise to come up with an alternative medication?

## 2024-02-17 MED ORDER — OXYCODONE-ACETAMINOPHEN 5-325 MG PO TABS
1.0000 | ORAL_TABLET | Freq: Four times a day (QID) | ORAL | 0 refills | Status: AC | PRN
Start: 2024-02-17 — End: ?

## 2024-02-19 ENCOUNTER — Other Ambulatory Visit: Payer: Self-pay

## 2024-02-19 MED ORDER — TOPIRAMATE 100 MG PO TABS: 100.0000 mg | ORAL_TABLET | Freq: Two times a day (BID) | ORAL | 2 refills | Status: AC

## 2024-02-19 MED ORDER — CARVEDILOL 25 MG PO TABS: ORAL_TABLET | ORAL | 2 refills | Status: AC

## 2024-03-05 ENCOUNTER — Other Ambulatory Visit: Payer: Self-pay | Admitting: Urgent Care

## 2024-03-05 DIAGNOSIS — E785 Hyperlipidemia, unspecified: Secondary | ICD-10-CM

## 2024-03-11 ENCOUNTER — Ambulatory Visit (INDEPENDENT_AMBULATORY_CARE_PROVIDER_SITE_OTHER): Admitting: Urgent Care

## 2024-03-11 ENCOUNTER — Encounter: Payer: Self-pay | Admitting: Urgent Care

## 2024-03-11 VITALS — BP 147/83 | HR 81 | Ht 74.0 in | Wt 300.0 lb

## 2024-03-11 DIAGNOSIS — E291 Testicular hypofunction: Secondary | ICD-10-CM

## 2024-03-11 DIAGNOSIS — I82432 Acute embolism and thrombosis of left popliteal vein: Secondary | ICD-10-CM

## 2024-03-11 DIAGNOSIS — M48061 Spinal stenosis, lumbar region without neurogenic claudication: Secondary | ICD-10-CM

## 2024-03-11 DIAGNOSIS — Z7984 Long term (current) use of oral hypoglycemic drugs: Secondary | ICD-10-CM

## 2024-03-11 DIAGNOSIS — Z7901 Long term (current) use of anticoagulants: Secondary | ICD-10-CM

## 2024-03-11 DIAGNOSIS — E119 Type 2 diabetes mellitus without complications: Secondary | ICD-10-CM | POA: Diagnosis not present

## 2024-03-11 DIAGNOSIS — I480 Paroxysmal atrial fibrillation: Secondary | ICD-10-CM

## 2024-03-11 DIAGNOSIS — R195 Other fecal abnormalities: Secondary | ICD-10-CM | POA: Diagnosis not present

## 2024-03-11 DIAGNOSIS — E785 Hyperlipidemia, unspecified: Secondary | ICD-10-CM

## 2024-03-11 DIAGNOSIS — Z86711 Personal history of pulmonary embolism: Secondary | ICD-10-CM

## 2024-03-11 DIAGNOSIS — Z6838 Body mass index (BMI) 38.0-38.9, adult: Secondary | ICD-10-CM

## 2024-03-11 MED ORDER — TIRZEPATIDE 2.5 MG/0.5ML ~~LOC~~ SOAJ: 2.5000 mg | SUBCUTANEOUS | 0 refills | Status: DC

## 2024-03-11 MED ORDER — WARFARIN SODIUM 7.5 MG PO TABS: 7.5000 mg | ORAL_TABLET | Freq: Every day | ORAL | 0 refills | Status: AC

## 2024-03-11 MED ORDER — FENOFIBRATE 160 MG PO TABS: 160.0000 mg | ORAL_TABLET | Freq: Every day | ORAL | 0 refills | Status: DC

## 2024-03-11 MED ORDER — OXYCODONE HCL 10 MG PO TABS: 10.0000 mg | ORAL_TABLET | Freq: Two times a day (BID) | ORAL | 0 refills | Status: DC

## 2024-03-11 NOTE — Progress Notes (Unsigned)
 Established Patient Office Visit  Subjective:  Patient ID: Jeff Green, male    DOB: 10/08/1955  Age: 68 y.o. MRN: [id number]  Chief Complaint  Patient presents with   Back Pain    HPI  Discussed the use of AI scribe software for clinical note transcription with the patient, who gave verbal consent to proceed.  History of Present Illness   Jeff Green is a 68 year old male with chronic back pain who presents with worsening back pain.  He has been experiencing severe back pain, rated as 8 out of 10, escalating to 10 out of 10. His back issues date back to 1988 after a fall from a building, leading to two successful back surgeries. Recently, he visited the ER on November 10th and 11th, where initial pain management was ineffective, but morphine  provided some relief. He is currently using shoulder pain medication and oxycodone , but reports inadequate pain control. He supplements with Tylenol , yet finds it difficult to function. No effective pain relief from medications, and he has difficulty sleeping due to pain.  He has a history of blood clots, which led to the placement of an IVC filter and lifelong Coumadin  therapy. A blood clot previously traveled from his ankle to his lungs, nearly fatal, prompting the current anticoagulation regimen. He follows a specific dosing schedule for Coumadin : 7.5 mg on Monday, Tuesday, and Friday, and 3.75 mg on the other days.  He is managing diabetes, with the most recent A1C of 9.4% on 11/19/23. He has experienced weight gain due to inactivity from back pain. He is also on testosterone  therapy, administering 1 ml biweekly, and has faced issues with pharmacy supply and insurance coverage for his medications.  He has a history of atrial fibrillation, which was exacerbated by a previous medication regimen that included multiple allergy medications. He has since discontinued phentermine  due to its impact on his heart condition.  He mentions a  recent change in his insurance plan, which requires documentation for his diabetes and heart medications. He is concerned about maintaining his current coverage.       Patient Active Problem List   Diagnosis Date Noted   Bite, fire ant 12/02/2023   Chronic rhinitis 10/02/2022   Primary osteoarthritis of left hip 06/09/2021   Anticoagulated on Coumadin  05/16/2021   Left hip pain 02/17/2021   Primary osteoarthritis of left knee 09/06/2020   H/O hernia repair 06/20/2020   COVID-19 lung 01/20/2020   Thrombocytopenia 08/12/2018   Liver lesion 08/12/2018   Lung nodule 08/12/2018   Presence of IVC filter 08/06/2018   DVT (deep venous thrombosis) (HCC) 07/29/2018   Renal insufficiency 06/27/2018   Insomnia 04/05/2017   OSA (obstructive sleep apnea) 03/21/2017   Primary osteoarthritis of both shoulders 11/28/2016   Controlled type 2 diabetes mellitus without complication, without long-term current use of insulin (HCC) 06/08/2016   Morbid obesity (HCC) 06/08/2016   Annual physical exam 01/25/2016   Paroxysmal atrial fibrillation (HCC) 06/15/2015   Polycythemia, secondary 08/26/2014   Hemochromatosis 08/26/2014   Multiple pulmonary nodules 08/16/2014   Testicular cancer (HCC) 08/12/2014   Hyperlipidemia 08/10/2014   Hypogonadism in male 08/10/2014   Essential hypertension 08/10/2014   Spinal stenosis of lumbar region 08/10/2014   History of pulmonary embolus (PE) 08/10/2014   CAD (coronary artery disease), native coronary artery 08/10/2014   Past Medical History:  Diagnosis Date   -+    Stent placed; [place]    Arthritis    Atrial fibrillation (HCC)  Cancer [facility])    testicular cancer   Cataract, left eye    Congestive heart failure (CHF) (HCC)    DVT (deep venous thrombosis) (HCC)    High cholesterol    Pneumonia    Psychosis (HCC)    Pulmonary embolus (HCC)    Past Surgical History:  Procedure Laterality Date   EYE SURGERY     HERNIA REPAIR     IVC   04/23/2012   JOINT REPLACEMENT     lamenectomy     left orchiectomy     SPINE SURGERY     TOTAL HIP ARTHROPLASTY Left 06/09/2021   Procedure: TOTAL HIP ARTHROPLASTY ANTERIOR APPROACH;  Surgeon: Yvone Rush, MD;  Location: [facility];  Service: Orthopedics;  Laterality: Left;   Social History   Tobacco Use   Smoking status: Former    Current packs/day: 0.00    Types: Cigarettes    Quit date: 08/10/1999    Years since quitting: 24.6   Smokeless tobacco: Never  Vaping Use   Vaping status: Never Used  Substance Use Topics   Alcohol use: Yes    Alcohol/week: 0.0 standard drinks of alcohol    Comment: Rare   Drug use: No      ROS: as noted in HPI  Objective:     BP (!) 147/83   Pulse 81   Ht 6' 2 (1.88 m)   Wt 300 lb (136.1 kg)   SpO2 97%   BMI 38.52 kg/m  BP Readings from Last 3 Encounters:  03/11/24 (!) 147/83  12/25/23 111/67  12/17/23 116/73   Wt Readings from Last 3 Encounters:  03/11/24 300 lb (136.1 kg)  12/17/23 281 lb 4 oz (127.6 kg)  11/19/23 280 lb (127 kg)      Physical Exam Vitals and nursing note reviewed. Exam conducted with a chaperone present.  Constitutional:      General: He is not in acute distress.    Appearance: Normal appearance. He is not ill-appearing, toxic-appearing or diaphoretic.  HENT:     Head: Normocephalic and atraumatic.     Right Ear: External ear normal.     Left Ear: External ear normal.     Nose: Nose normal.  Eyes:     General: No scleral icterus.       Right eye: No discharge.        Left eye: No discharge.     Pupils: Pupils are equal, round, and reactive to light.  Cardiovascular:     Rate and Rhythm: Normal rate and regular rhythm.  Pulmonary:     Effort: Pulmonary effort is normal. No respiratory distress.     Breath sounds: Normal breath sounds. No stridor. No wheezing or rhonchi.  Musculoskeletal:     Cervical back: Normal range of motion and neck supple. No rigidity or tenderness.  Lymphadenopathy:      Cervical: No cervical adenopathy.  Skin:    General: Skin is warm and dry.     Findings: No erythema or rash.  Neurological:     General: No focal deficit present.     Mental Status: He is alert and oriented to person, place, and time.        Last lipids Lab Results  Component Value Date   CHOL 170 11/19/2023   HDL 47 11/19/2023   LDLCALC 76 11/19/2023   TRIG 294 (H) 11/19/2023   CHOLHDL 3.6 11/19/2023    Last thyroid  functions Lab Results  Component Value Date   TSH 3.240  11/19/2023   Last vitamin D  Lab Results  Component Value Date   VD25OH 50 06/09/2014   Last vitamin B12 and Folate Lab Results  Component Value Date   VITAMINB12 795 09/27/2020      The 10-year ASCVD risk score (Arnett DK, et al., 2019) is: 32.7%  Assessment & Plan:  Spinal stenosis of lumbar region, unspecified whether neurogenic claudication present -     oxyCODONE  HCl; Take 1 tablet (10 mg total) by mouth in the morning and at bedtime.  Dispense: 60 tablet; Refill: 0 -     Ambulatory referral to Orthopedics  Hyperlipidemia, unspecified hyperlipidemia type -     Fenofibrate ; Take 1 tablet (160 mg total) by mouth daily.  Dispense: 60 tablet; Refill: 0  Paroxysmal atrial fibrillation (HCC) -     Warfarin Sodium ; Take 1 tablet (7.5 mg total) by mouth daily.  Dispense: 90 tablet; Refill: 0  Positive colorectal cancer screening using Cologuard test  History of pulmonary embolus (PE) -     Protime-INR  Deep vein thrombosis (DVT) of popliteal vein of left lower extremity, unspecified chronicity (HCC) -     Protime-INR  Anticoagulated on Coumadin  -     Warfarin Sodium ; Take 1 tablet (7.5 mg total) by mouth daily.  Dispense: 90 tablet; Refill: 0 -     Protime-INR  Controlled type 2 diabetes mellitus without complication, without long-term current use of insulin (HCC) -     Tirzepatide; Inject 2.5 mg into the skin once a week.  Dispense: 2 mL; Refill: 0 -     Hemoglobin A1c -      CMP14+EGFR -     CBC with Differential/Platelet  Hypogonadism in male -     Testosterone  Cypionate; 1mL IM every other week.  Dispense: 10 mL; Refill: 0  BMI 38.0-38.9,adult -     Tirzepatide; Inject 2.5 mg into the skin once a week.  Dispense: 2 mL; Refill: 0   Assessment and Plan    Chronic low back pain with lumbar spinal stenosis Chronic low back pain with lumbar spinal stenosis, exacerbated recently. Current pain management with percocet 5mg  BID and Tylenol  is insufficient. - Increased oxycodone  to 10 mg twice daily. Continue with 1000mg  tylenol  three times daily - Referred to Dr. Terrell for spine specialist consultation and potential pain management interventions.  Paroxysmal atrial fibrillation on chronic anticoagulation Paroxysmal atrial fibrillation managed with chronic anticoagulation due to history of pulmonary embolism and IVC filter. Prefers Coumadin  over newer anticoagulants due to concerns about side effects. - Continue Coumadin  therapy. - Ordered INR test to monitor anticoagulation levels.  Type 2 diabetes mellitus Recent A1c of 6.5%. Weight gain noted, potentially exacerbated by recent back pain limiting activity. - Added Mounjaro to help decrease blood sugar and assist with weight management.  Hyperlipidemia Managed with rosuvastatin  and fenofibrate . - Continue current regimen of rosuvastatin  and fenofibrate .  Obesity Recent weight gain, potentially due to decreased activity from back pain. Discussed potential benefits of Mounjaro for weight management. - Added Mounjaro to assist with weight management.  Testosterone  deficiency Managed with testosterone  injections. Recent injection received, next due in January. - Will schedule testosterone  level check in January, prior to next injection.  History of pulmonary embolism and deep vein thrombosis with IVC filter Pulmonary embolism and deep vein thrombosis with IVC filter. Continues on Coumadin  for  anticoagulation. - Continue Coumadin  therapy.         Return in about 2 months (around 05/11/2024).   Benton LITTIE Gave, PA

## 2024-03-11 NOTE — Patient Instructions (Signed)
 Continue all medications as ordered for now other than pain medication. STOP percocet 5 - start plain oxycodone  10mg  instead. You can take additional tylenol  1000mg  three times daily Please follow up with Dr. Beuford - referral has been placed.  Will reach out via mychart with lab results.  ADD mounjaro 2.5mg  once weekly - this will help with diabetes and weight loss.  Please return for in office visit in 2 months - please come the day PRIOR to your next testosterone  injection so that we can recheck these levels.

## 2024-03-12 ENCOUNTER — Encounter: Payer: Self-pay | Admitting: Urgent Care

## 2024-03-12 LAB — CBC WITH DIFFERENTIAL/PLATELET
Basophils Absolute: 0 x10E3/uL (ref 0.0–0.2)
Basos: 1 %
EOS (ABSOLUTE): 0.2 x10E3/uL (ref 0.0–0.4)
Eos: 3 %
Hematocrit: 49.5 % (ref 37.5–51.0)
Hemoglobin: 16 g/dL (ref 13.0–17.7)
Immature Grans (Abs): 0 x10E3/uL (ref 0.0–0.1)
Immature Granulocytes: 0 %
Lymphocytes Absolute: 2 x10E3/uL (ref 0.7–3.1)
Lymphs: 26 %
MCH: 30.1 pg (ref 26.6–33.0)
MCHC: 32.3 g/dL (ref 31.5–35.7)
MCV: 93 fL (ref 79–97)
Monocytes Absolute: 0.8 x10E3/uL (ref 0.1–0.9)
Monocytes: 11 %
Neutrophils Absolute: 4.6 x10E3/uL (ref 1.4–7.0)
Neutrophils: 59 %
Platelets: 155 x10E3/uL (ref 150–450)
RBC: 5.32 x10E6/uL (ref 4.14–5.80)
RDW: 13.3 % (ref 11.6–15.4)
WBC: 7.6 x10E3/uL (ref 3.4–10.8)

## 2024-03-12 LAB — CMP14+EGFR
ALT: 33 IU/L (ref 0–44)
AST: 37 IU/L (ref 0–40)
Albumin: 4.5 g/dL (ref 3.9–4.9)
Alkaline Phosphatase: 82 IU/L (ref 47–123)
BUN/Creatinine Ratio: 16 (ref 10–24)
BUN: 22 mg/dL (ref 8–27)
Bilirubin Total: 0.3 mg/dL (ref 0.0–1.2)
CO2: 19 mmol/L — ABNORMAL LOW (ref 20–29)
Calcium: 10.2 mg/dL (ref 8.6–10.2)
Chloride: 104 mmol/L (ref 96–106)
Creatinine, Ser: 1.34 mg/dL — ABNORMAL HIGH (ref 0.76–1.27)
Globulin, Total: 2.3 g/dL (ref 1.5–4.5)
Glucose: 182 mg/dL — ABNORMAL HIGH (ref 70–99)
Potassium: 4.6 mmol/L (ref 3.5–5.2)
Sodium: 138 mmol/L (ref 134–144)
Total Protein: 6.8 g/dL (ref 6.0–8.5)
eGFR: 58 mL/min/1.73 — ABNORMAL LOW (ref >59)

## 2024-03-12 LAB — PROTIME-INR
INR: 2.7 — ABNORMAL HIGH (ref 0.9–1.2)
Prothrombin Time: 27.8 s — ABNORMAL HIGH (ref 9.1–12.0)

## 2024-03-12 LAB — HEMOGLOBIN A1C
Est. average glucose Bld gHb Est-mCnc: 157 mg/dL
Hgb A1c MFr Bld: 7.1 % — ABNORMAL HIGH (ref 4.8–5.6)

## 2024-03-12 MED ORDER — TESTOSTERONE CYPIONATE 200 MG/ML IM SOLN: INTRAMUSCULAR | 0 refills | Status: DC

## 2024-03-13 ENCOUNTER — Ambulatory Visit: Payer: Self-pay | Admitting: Urgent Care

## 2024-03-17 NOTE — Progress Notes (Signed)
 Parvin Stetzer                                          MRN: [id number]   03/17/2024   The [facility] Quality Team Specialist reviewed this patient medical record for the purposes of chart review for care gap closure. The following were reviewed: abstraction for care gap closure-glycemic status assessment.    [facility] Quality Team

## 2024-03-18 ENCOUNTER — Telehealth: Payer: Self-pay

## 2024-03-18 ENCOUNTER — Other Ambulatory Visit: Payer: Self-pay

## 2024-03-18 DIAGNOSIS — E119 Type 2 diabetes mellitus without complications: Secondary | ICD-10-CM

## 2024-03-18 MED ORDER — CYCLOBENZAPRINE HCL 5 MG PO TABS: 5.0000 mg | ORAL_TABLET | Freq: Three times a day (TID) | ORAL | 1 refills | Status: DC | PRN

## 2024-03-18 MED ORDER — GLIPIZIDE 10 MG PO TABS: 10.0000 mg | ORAL_TABLET | Freq: Two times a day (BID) | ORAL | 3 refills | Status: AC

## 2024-03-18 NOTE — Telephone Encounter (Signed)
 Copied from CRM #[id number]. Topic: Clinical - Medication Question >> Mar 18, 2024  9:35 AM Mia F wrote: Reason for CRM: Pt says he recently been in the hospital 2/3 times and he was given a rx for Flexeril . He mentioned to Dr Lowella that he need the medication for his back. He says that Dr Crain has given him outher medications and she also asked if he needed anything else. He says he forgot to mention Flexeril  and he does need it. He says it does help with his back and help him sleep. He says he has not had much sleep since he ran out of the medication.   If able to fill, please send to: [facility] [id number] - [place], [place] - [address] [place]  [address] Phone: [phone] Fax: [phone]  Please call pt whether or not this will be sent

## 2024-03-18 NOTE — Telephone Encounter (Signed)
 Flexeril  5 mg 3 times daily as needed has been sent to the [facility] pharmacy as requested.  ___________________________________________ Zada FREDRIK Palin, DNP, APRN, FNP-BC Primary Care and Sports Medicine [facility] [place]

## 2024-03-18 NOTE — Telephone Encounter (Signed)
 Patient informed.

## 2024-03-18 NOTE — Telephone Encounter (Signed)
 Forwarding message to Zada Palin, NP covering Benton Gave  Flexeril  not showing in patient current med list  or past med list Last OV 03/11/2024 Upcoming appt 05/20/2024

## 2024-04-02 ENCOUNTER — Ambulatory Visit: Admitting: Gastroenterology

## 2024-04-02 ENCOUNTER — Encounter: Payer: Self-pay | Admitting: Gastroenterology

## 2024-04-02 VITALS — BP 124/72 | HR 72 | Ht 74.0 in | Wt 300.0 lb

## 2024-04-02 DIAGNOSIS — I4891 Unspecified atrial fibrillation: Secondary | ICD-10-CM

## 2024-04-02 DIAGNOSIS — Z7901 Long term (current) use of anticoagulants: Secondary | ICD-10-CM

## 2024-04-02 DIAGNOSIS — I10 Essential (primary) hypertension: Secondary | ICD-10-CM

## 2024-04-02 DIAGNOSIS — R195 Other fecal abnormalities: Secondary | ICD-10-CM | POA: Diagnosis not present

## 2024-04-02 DIAGNOSIS — I2510 Atherosclerotic heart disease of native coronary artery without angina pectoris: Secondary | ICD-10-CM

## 2024-04-02 DIAGNOSIS — Z87891 Personal history of nicotine dependence: Secondary | ICD-10-CM

## 2024-04-02 DIAGNOSIS — I82432 Acute embolism and thrombosis of left popliteal vein: Secondary | ICD-10-CM

## 2024-04-02 DIAGNOSIS — I480 Paroxysmal atrial fibrillation: Secondary | ICD-10-CM

## 2024-04-02 NOTE — Patient Instructions (Signed)
 Please contact your cardiologist for an appointment to evaluate your chest pain.   Then contact our office after you have been evaluated.   _______________________________________________________  If your blood pressure at your visit was 140/90 or greater, please contact your primary care physician to follow up on this.  _______________________________________________________  If you are age 66 or older, your body mass index should be between 23-30. Your Body mass index is 38.52 kg/m. If this is out of the aforementioned range listed, please consider follow up with your Primary Care Provider.  If you are age 75 or younger, your body mass index should be between 19-25. Your Body mass index is 38.52 kg/m. If this is out of the aformentioned range listed, please consider follow up with your Primary Care Provider.   ________________________________________________________  The [facility] providers would like to encourage you to use MYCHART to communicate with providers for non-urgent requests or questions.  Due to long hold times on the telephone, sending your provider a message by [facility] may be a faster and more efficient way to get a response.  Please allow 48 business hours for a response.  Please remember that this is for non-urgent requests.  _______________________________________________________  [facility] is using a team-based approach to care.  Your team is made up of your doctor and two to three APPS. Our APPS (Nurse Practitioners and Physician Assistants) work with your physician to ensure care continuity for you. They are fully qualified to address your health concerns and develop a treatment plan. They communicate directly with your gastroenterologist to care for you. Seeing the Advanced Practice Practitioners on your physician's team can help you by facilitating care more promptly, often allowing for earlier appointments, access to diagnostic testing, procedures, and  other specialty referrals.

## 2024-04-02 NOTE — Progress Notes (Signed)
 Chief Complaint: Positive Cologuard Primary GI MD: Sampson  HPI: 68 year old male history of MI 2012, A-fib, CHF, DVT, PE, and others as listed below presents for evaluation of positive Cologuard  Recently seen at [facility] 03/27/2024 for chest pain Pain reported in center of chest feeling as if someone punched him with radiation to bilateral jaw.  Reports ported history of heart attack and patient stated it was a widow cytogeneticist while living in [place] in 2012.  He has been a resident in [place]  for 7 years and has not seen his cardiologist in some time now.  Also ports past history of A-fib. Workup in ED - Chest x-ray with mild cardiomegaly - Troponin 10, repeat 11 - CBC/CMP unremarkable - EKG without STEMI - Improvement in symptoms after administration of nitroglycerin  Patient was instructed to follow-up with his cardiologist (Dr. Pietro) whom he has not seen since 2017  -------------TODAY-----------------------------  Discussed the use of AI scribe software for clinical note transcription with the patient, who gave verbal consent to proceed.  History of Present Illness He experienced chest pain that began around 10:30 to 11:00 PM, described as a 'weird pain' with a pulling sensation starting in the chest and radiating bilaterally. The pain was intermittent, occurring every 30 to 45 minutes, with each episode lasting about 4 to 5 minutes. He has a history of a significant myocardial infarction in 2012, during which stents were placed in his carotid artery, and he has not seen his cardiologist since 2017.  He recently had a positive Cologuard test, following a previous negative result in 2022. He has a history of a colonoscopy performed years ago in [place], which was clear of polyps. No gastrointestinal symptoms such as changes in bowel habits, nausea, vomiting, weight loss, or rectal bleeding.  He reports a  recent incident where he lost his footing and hit his mouth, resulting in a loose tooth that eventually fell out without bleeding.   Past Medical History:  Diagnosis Date   -+    Stent placed; [place]    Arthritis    Atrial fibrillation (HCC)    Cancer (HCC)    testicular cancer   Cataract, left eye    Congestive heart failure (CHF) (HCC)    DVT (deep venous thrombosis) (HCC)    High cholesterol    Hypertension    Obesity    Pneumonia    Psychosis (HCC)    Pulmonary embolus [facility])     Past Surgical History:  Procedure Laterality Date   COLONOSCOPY     EYE SURGERY     HERNIA REPAIR     IVC  04/23/2012   lamenectomy     left orchiectomy     SPINE SURGERY     TOTAL HIP ARTHROPLASTY Left 06/09/2021   Procedure: TOTAL HIP ARTHROPLASTY ANTERIOR APPROACH;  Surgeon: Yvone Rush, MD;  Location: [facility];  Service: Orthopedics;  Laterality: Left;    Current Outpatient Medications  Medication Sig Dispense Refill   amitriptyline  (ELAVIL ) 50 MG tablet TAKE 1/2 TABLET BY MOUTH AT BEDTIME FOR 1 WEEK, THEN 1 TABLET  AT BEDTIME 90 tablet 3   Biotin 5000 MCG CAPS Take 5,000 mcg by mouth daily.     carvedilol  (COREG ) 25 MG tablet TAKE 1 TABLET BY MOUTH TWICE A DAY WITH MEALS 180 tablet 2   Coenzyme Q10 (COQ-10 PO) Take 200 mg by mouth daily.     cyclobenzaprine  (  FLEXERIL ) 5 MG tablet Take 1 tablet (5 mg total) by mouth 3 (three) times daily as needed for muscle spasms. 30 tablet 1   diltiazem  (CARDIZEM  CD) 120 MG 24 hr capsule Take 1 capsule (120 mg total) by mouth daily. 90 capsule 0   docusate sodium  (COLACE) 100 MG capsule Take 1 capsule (100 mg total) by mouth 2 (two) times daily. 30 capsule 0   fenofibrate  160 MG tablet Take 1 tablet (160 mg total) by mouth daily. 60 tablet 0   glipiZIDE  (GLUCOTROL ) 10 MG tablet Take 1 tablet (10 mg total) by mouth 2 (two) times daily before a meal. 60 tablet 3   GLUCOSAMINE CHONDROITIN COMPLX PO Take 2 tablets by mouth daily.     Glucose  Blood (BLOOD GLUCOSE TEST STRIPS) STRP For once daily glucose testing. May substitute to any manufacturer covered by patient's insurance. 100 strip 0   Multiple Vitamin (MULTIVITAMIN) capsule Take 1 capsule by mouth daily.     NEEDLE, DISP, 18 G 18G X 1-1/2 MISC Use as needed for testosterone  injections 100 each 11   Oxycodone  HCl 10 MG TABS Take 1 tablet (10 mg total) by mouth in the morning and at bedtime. 60 tablet 0   Probiotic Product (PROBIOTIC DAILY PO) Take 1 capsule by mouth.     rosuvastatin  (CRESTOR ) 40 MG tablet Take 1 tablet (40 mg total) by mouth daily. 90 tablet 3   Syringe/Needle, Disp, (SYRINGE 3CC/22GX1) 22G X 1 3 ML MISC Use as needed for testosterone  injections 100 each 11   testosterone  cypionate (DEPOTESTOSTERONE CYPIONATE) 200 MG/ML injection 1mL IM every other week. 10 mL 0   tirzepatide  (MOUNJARO ) 2.5 MG/0.5ML Pen Inject 2.5 mg into the skin once a week. 2 mL 0   topiramate  (TOPAMAX ) 100 MG tablet Take 1 tablet (100 mg total) by mouth 2 (two) times daily. 180 tablet 2   warfarin (COUMADIN ) 7.5 MG tablet Take 1 tablet (7.5 mg total) by mouth daily. 90 tablet 0   No current facility-administered medications for this visit.    Allergies as of 04/02/2024 - Review Complete 04/02/2024  Allergen Reaction Noted   Lipitor [atorvastatin ]  08/24/2014    Family History  Problem Relation Age of Onset   Thyroid  cancer Mother    Depression Mother    High Cholesterol Father    High blood pressure Father    Heart disease Father        Atrial fibrillation   Arthritis Father    Diabetes Father    Thyroid  cancer Sister    Stroke Other        Grandfather   Colon cancer Neg Hx    Esophageal cancer Neg Hx     Social History   Socioeconomic History   Marital status: Married    Spouse name: Not on file   Number of children: 5   Years of education: Not on file   Highest education level: 12th grade  Occupational History   Occupation: retired  Tobacco Use   Smoking  status: Former    Current packs/day: 0.00    Types: Cigarettes    Quit date: 08/10/1999    Years since quitting: 24.6   Smokeless tobacco: Never  Vaping Use   Vaping status: Never Used  Substance and Sexual Activity   Alcohol use: Not Currently   Drug use: No   Sexual activity: Yes    Partners: Female  Other Topics Concern   Not on file  Social History Narrative  Not on file   Social Drivers of Health   Tobacco Use: Medium Risk (04/02/2024)   Patient History    Smoking Tobacco Use: Former    Smokeless Tobacco Use: Never    Passive Exposure: Not on file  Financial Resource Strain: Low Risk (11/15/2023)   Overall Financial Resource Strain (CARDIA)    Difficulty of Paying Living Expenses: Not hard at all  Food Insecurity: No Food Insecurity (11/15/2023)   Epic    Worried About Radiation Protection Practitioner of Food in the Last Year: Never true    Ran Out of Food in the Last Year: Never true  Transportation Needs: No Transportation Needs (11/15/2023)   Epic    Lack of Transportation (Medical): No    Lack of Transportation (Non-Medical): No  Physical Activity: Unknown (11/15/2023)   Exercise Vital Sign    Days of Exercise per Week: Patient declined    Minutes of Exercise per Session: Not on file  Stress: No Stress Concern Present (11/15/2023)   [employer] of Occupational Health - Occupational Stress Questionnaire    Feeling of Stress: Only a little  Social Connections: Unknown (11/15/2023)   Social Connection and Isolation Panel    Frequency of Communication with Friends and Family: More than three times a week    Frequency of Social Gatherings with Friends and Family: Twice a week    Attends Religious Services: Not on Marketing Executive or Organizations: Yes    Attends Banker Meetings: More than 4 times per year    Marital Status: Married  Catering Manager Violence: Not At Risk (10/05/2023)   Received from [facility]   HITS    Over the last 12 months how  often did your partner physically hurt you?: Never    Over the last 12 months how often did your partner insult you or talk down to you?: Never    Over the last 12 months how often did your partner threaten you with physical harm?: Never    Over the last 12 months how often did your partner scream or curse at you?: Never  Depression (PHQ2-9): High Risk (03/11/2024)   Depression (PHQ2-9)    PHQ-2 Score: 12  Alcohol Screen: Not on file  Housing: Unknown (11/15/2023)   Epic    Unable to Pay for Housing in the Last Year: No    Number of Times Moved in the Last Year: Not on file    Homeless in the Last Year: No  Utilities: Not on file  Health Literacy: Not on file    Review of Systems:    Constitutional: No weight loss, fever, chills, weakness or fatigue HEENT: Eyes: No change in vision               Ears, Nose, Throat:  No change in hearing or congestion Skin: No rash or itching Cardiovascular: No chest pain, chest pressure or palpitations   Respiratory: No SOB or cough Gastrointestinal: See HPI and otherwise negative Genitourinary: No dysuria or change in urinary frequency Neurological: No headache, dizziness or syncope Musculoskeletal: No new muscle or joint pain Hematologic: No bleeding or bruising Psychiatric: No history of depression or anxiety    Physical Exam:  Vital signs: BP 124/72   Pulse 72   Ht 6' 2 (1.88 m)   Wt 300 lb (136.1 kg)   BMI 38.52 kg/m   Constitutional: NAD, alert and cooperative Head:  Normocephalic and atraumatic. Eyes:   PEERL, EOMI. No icterus.  Conjunctiva pink. Respiratory: Respirations even and unlabored. Lungs clear to auscultation bilaterally.   No wheezes, crackles, or rhonchi.  Cardiovascular:  Regular rate and rhythm. No peripheral edema, cyanosis or pallor.  Gastrointestinal:  Soft, nondistended, nontender. No rebound or guarding. Normal bowel sounds. No appreciable masses or hepatomegaly. Rectal:  Declines Msk:  Symmetrical without  gross deformities. Without edema, no deformity or joint abnormality.  Neurologic:  Alert and  oriented x4;  grossly normal neurologically.  Skin:   Dry and intact without significant lesions or rashes. Psychiatric: Oriented to person, place and time. Demonstrates good judgement and reason without abnormal affect or behaviors.  Physical Exam    RELEVANT LABS AND IMAGING: CBC    Component Value Date/Time   WBC 7.6 03/11/2024 1007   WBC 6.9 10/02/2022 1423   RBC 5.32 03/11/2024 1007   RBC 4.88 10/02/2022 1423   HGB 16.0 03/11/2024 1007   HCT 49.5 03/11/2024 1007   PLT 155 03/11/2024 1007   MCV 93 03/11/2024 1007   MCH 30.1 03/11/2024 1007   MCH 30.7 10/02/2022 1423   MCHC 32.3 03/11/2024 1007   MCHC 33.4 10/02/2022 1423   RDW 13.3 03/11/2024 1007   LYMPHSABS 2.0 03/11/2024 1007   MONOABS 0.9 08/12/2018 1217   EOSABS 0.2 03/11/2024 1007   BASOSABS 0.0 03/11/2024 1007    CMP     Component Value Date/Time   NA 138 03/11/2024 1007   K 4.6 03/11/2024 1007   CL 104 03/11/2024 1007   CO2 19 (L) 03/11/2024 1007   GLUCOSE 182 (H) 03/11/2024 1007   GLUCOSE 217 (H) 10/02/2022 1423   BUN 22 03/11/2024 1007   CREATININE 1.34 (H) 03/11/2024 1007   CREATININE 1.28 10/02/2022 1423   CALCIUM  10.2 03/11/2024 1007   PROT 6.8 03/11/2024 1007   ALBUMIN 4.5 03/11/2024 1007   AST 37 03/11/2024 1007   AST 19 08/12/2018 1217   ALT 33 03/11/2024 1007   ALT 20 08/12/2018 1217   ALKPHOS 82 03/11/2024 1007   BILITOT 0.3 03/11/2024 1007   BILITOT 0.4 08/12/2018 1217   GFRNONAA 58 (L) 05/29/2021 1041   GFRNONAA 37 (L) 07/10/2019 1535   GFRAA 43 (L) 07/10/2019 1535     Assessment/Plan:   Positive Cologuard Reports colonoscopy many years ago in [place] which was normal.  Negative Cologuard 2022.  Positive Cologuard August 2025.  Recent ED visit for chest pain and previous CAD history.  Has not seen cardiologist since 2017.  Patient does need a colonoscopy for further evaluation of  positive Cologuard but currently has absence of GI symptoms.  With his chest pain and history of CAD best he sees his cardiologist for clearance prior to undergoing endoscopic evaluation - Follow-up with Dr. Pietro for evaluation of chest pain and history of CAD - Follow-up with our office after this cardiac evaluation is complete, sometime spring 2026 - Once cardiac clearance is obtained we can pursue colonoscopy  CHF A-fib CAD DVT/PE On Coumadin .  Self-reported  widow maker type MI in 2012 s/p stent placement. has been seen in ED 12/5 for chest pain with improvement after nitro and overall negative workup.  Echo 2021 EF 60 to 65%.  Has not seen cardiologist in quite some time. - Follow-up with cardiology prior to consideration of any endoscopic procedures  Chronic back pain On chronic Percocet  Assigned to Dr. Albertus today (Thursday)  [facility] 04/02/2024, 3:51 PM  Cc: Lowella Benton CROME, PA

## 2024-04-05 ENCOUNTER — Other Ambulatory Visit: Payer: Self-pay | Admitting: Urgent Care

## 2024-04-05 DIAGNOSIS — E119 Type 2 diabetes mellitus without complications: Secondary | ICD-10-CM

## 2024-04-05 DIAGNOSIS — Z6838 Body mass index (BMI) 38.0-38.9, adult: Secondary | ICD-10-CM

## 2024-04-06 ENCOUNTER — Other Ambulatory Visit: Payer: Self-pay | Admitting: Urgent Care

## 2024-04-06 DIAGNOSIS — M48061 Spinal stenosis, lumbar region without neurogenic claudication: Secondary | ICD-10-CM

## 2024-04-06 DIAGNOSIS — E291 Testicular hypofunction: Secondary | ICD-10-CM

## 2024-04-06 NOTE — Telephone Encounter (Unsigned)
 Copied from CRM #[id number]. Topic: Clinical - Medication Refill >> Apr 06, 2024  3:25 PM [name] wrote: Medication: Oxycodone  HCl 10 MG TABS- send to [facility]   testosterone  cypionate (DEPOTESTOSTERONE CYPIONATE) 200 MG/ML injection- [facility] [address], [place] - [address] [place], [place] [address] Phone: [phone]  Fax: [phone]    Has the patient contacted their pharmacy? Yes (Agent: If no, request that the patient contact the pharmacy for the refill. If patient does not wish to contact the pharmacy document the reason why and proceed with request.) (Agent: If yes, when and what did the pharmacy advise?)  This is the patient's preferred pharmacy:  [id number] - [place], [place] - [address] [place] [address] Phone: [phone] Fax: [phone]  Is this the correct pharmacy for this prescription? Yes If no, delete pharmacy and type the correct one.   Has the prescription been filled recently? Yes  Is the patient out of the medication? Yes  Has the patient been seen for an appointment in the last year OR does the patient have an upcoming appointment? Yes  Can we respond through MyChart? Yes  Agent: Please be advised that Rx refills may take up to 3 business days. We ask that you follow-up with your pharmacy.

## 2024-04-07 NOTE — Telephone Encounter (Signed)
 TESTOSTERONE  Last OV: 03/11/24 Next OV: 05/20/24 Last RF: 03/12/24  OXYCODONE  Last OV: 03/11/24 Next OV: 05/20/24 Last RF: 03/11/24

## 2024-04-08 MED ORDER — SYRINGE 22G X 1" 3 ML MISC: 11 refills | Status: AC

## 2024-04-08 MED ORDER — OXYCODONE HCL 10 MG PO TABS: 10.0000 mg | ORAL_TABLET | Freq: Two times a day (BID) | ORAL | 0 refills | Status: DC

## 2024-04-08 MED ORDER — TESTOSTERONE CYPIONATE 200 MG/ML IM SOLN: INTRAMUSCULAR | 0 refills | Status: AC

## 2024-04-11 ENCOUNTER — Other Ambulatory Visit: Payer: Self-pay | Admitting: Family Medicine

## 2024-05-02 ENCOUNTER — Other Ambulatory Visit: Payer: Self-pay | Admitting: Urgent Care

## 2024-05-02 DIAGNOSIS — E119 Type 2 diabetes mellitus without complications: Secondary | ICD-10-CM

## 2024-05-02 DIAGNOSIS — Z6838 Body mass index (BMI) 38.0-38.9, adult: Secondary | ICD-10-CM

## 2024-05-05 ENCOUNTER — Other Ambulatory Visit: Payer: Self-pay | Admitting: Medical-Surgical

## 2024-05-05 ENCOUNTER — Telehealth: Payer: Self-pay

## 2024-05-05 DIAGNOSIS — E119 Type 2 diabetes mellitus without complications: Secondary | ICD-10-CM

## 2024-05-05 NOTE — Telephone Encounter (Signed)
 Any recommendations you might have regarding this?

## 2024-05-05 NOTE — Telephone Encounter (Signed)
 Copied from CRM [id number]. Topic: Clinical - Medication Question >> May 05, 2024 11:00 AM Myrick T wrote: Reason for CRM: patient said he has to pay $522 for his Mounjaro  the first month and then next 4 months its $200. Patient is asking for some type of discount or program he can get on to get his medication at a lower cost.

## 2024-05-07 ENCOUNTER — Other Ambulatory Visit: Payer: Self-pay | Admitting: Urgent Care

## 2024-05-07 DIAGNOSIS — E785 Hyperlipidemia, unspecified: Secondary | ICD-10-CM

## 2024-05-07 NOTE — Telephone Encounter (Signed)
 FYI Requesting rx rf of Fenofibrate  160mg   Last written 03/11/2025 as 60 day supply  Last OV 03/11/2024 Last lipid panel 11/19/2023 Upcoming appt 05/20/2024  Refilled as 30 day supply per protocol  until patient can be seen at appt

## 2024-05-08 ENCOUNTER — Telehealth: Payer: Self-pay

## 2024-05-08 ENCOUNTER — Other Ambulatory Visit: Payer: Self-pay

## 2024-05-08 DIAGNOSIS — E1149 Type 2 diabetes mellitus with other diabetic neurological complication: Secondary | ICD-10-CM

## 2024-05-08 DIAGNOSIS — M48061 Spinal stenosis, lumbar region without neurogenic claudication: Secondary | ICD-10-CM

## 2024-05-08 MED ORDER — LIDOCAINE 5 % EX PTCH: 1.0000 | MEDICATED_PATCH | CUTANEOUS | 3 refills | Status: DC

## 2024-05-08 NOTE — Telephone Encounter (Signed)
 Copied from CRM [id number]. Topic: Clinical - Medication Question >> May 08, 2024  2:33 PM Emylou G wrote: Reason for CRM: Patient inquiring if he can lidocaine  5% patch ( he initially got some from 11/10 at the hospital ) he has tried before.SABRA He wants to know if he can get script for it.SABRA He said it helped so much with his back.. Can you call him back

## 2024-05-08 NOTE — Telephone Encounter (Signed)
 Pended prescription Last fill 04/08/24 Last visit 03/11/24 Next visit 05/20/24

## 2024-05-08 NOTE — Telephone Encounter (Signed)
 Copied from CRM [id number]. Topic: Clinical - Medication Refill >> May 08, 2024  1:31 PM Mercer PEDLAR wrote: Medication: Oxycodone  HCl 10 MG TABS  Has the patient contacted their pharmacy? Yes (Agent: If no, request that the patient contact the pharmacy for the refill. If patient does not wish to contact the pharmacy document the reason why and proceed with request.) (Agent: If yes, when and what did the pharmacy advise?)  This is the patient's preferred pharmacy:  [id number] - [place], [place] - [address] [place] [address] Phone: [phone] Fax: [phone]  Is this the correct pharmacy for this prescription? Yes If no, delete pharmacy and type the correct one.   Has the prescription been filled recently? No  Is the patient out of the medication? No (has one left)  Has the patient been seen for an appointment in the last year OR does the patient have an upcoming appointment? Yes  Can we respond through MyChart? No  Agent: Please be advised that Rx refills may take up to 3 business days. We ask that you follow-up with your pharmacy.

## 2024-05-10 MED ORDER — OXYCODONE HCL 10 MG PO TABS: 10.0000 mg | ORAL_TABLET | Freq: Two times a day (BID) | ORAL | 0 refills | Status: AC

## 2024-05-13 ENCOUNTER — Telehealth: Payer: Self-pay | Admitting: *Deleted

## 2024-05-13 NOTE — Progress Notes (Signed)
 Care Guide Pharmacy Note  05/13/2024 Name: Jeff Green MRN: [id number] DOB: 09/14/1955  Referred By: Lowella Benton CROME, PA Reason for referral: Complex Care Management (Outreach to schedule referral with pharmacist )   Jeff Green is a 69 y.o. year old male who is a primary care patient of Lowella Benton L, [place].  Charlie Hilda was referred to the pharmacist for assistance related to: DMII  Successful contact was made with the patient to discuss pharmacy services including being ready for the pharmacist to call at least 5 minutes before the scheduled appointment time and to have medication bottles and any blood pressure readings ready for review. The patient agreed to meet with the pharmacist via telephone visit on 06/01/2024  Thedford Franks, CMA, AAMA [place]ricks  [facility], [facility] Guide, Lead Direct Dial: [phone]  Fax: [phone]

## 2024-05-20 ENCOUNTER — Ambulatory Visit: Admitting: Urgent Care

## 2024-05-27 ENCOUNTER — Ambulatory Visit: Admitting: Urgent Care

## 2024-05-27 ENCOUNTER — Encounter: Payer: Self-pay | Admitting: Urgent Care

## 2024-05-27 VITALS — BP 132/76 | HR 74 | Ht 74.0 in | Wt 301.0 lb

## 2024-05-27 DIAGNOSIS — N289 Disorder of kidney and ureter, unspecified: Secondary | ICD-10-CM

## 2024-05-27 DIAGNOSIS — G4733 Obstructive sleep apnea (adult) (pediatric): Secondary | ICD-10-CM

## 2024-05-27 DIAGNOSIS — E291 Testicular hypofunction: Secondary | ICD-10-CM

## 2024-05-27 DIAGNOSIS — I10 Essential (primary) hypertension: Secondary | ICD-10-CM

## 2024-05-27 DIAGNOSIS — E1140 Type 2 diabetes mellitus with diabetic neuropathy, unspecified: Secondary | ICD-10-CM

## 2024-05-27 DIAGNOSIS — R208 Other disturbances of skin sensation: Secondary | ICD-10-CM

## 2024-05-27 DIAGNOSIS — E785 Hyperlipidemia, unspecified: Secondary | ICD-10-CM

## 2024-05-27 DIAGNOSIS — I480 Paroxysmal atrial fibrillation: Secondary | ICD-10-CM

## 2024-05-27 DIAGNOSIS — Z8547 Personal history of malignant neoplasm of testis: Secondary | ICD-10-CM

## 2024-05-27 MED ORDER — LIDOCAINE 5 % EX PTCH: 1.0000 | MEDICATED_PATCH | CUTANEOUS | 6 refills | Status: AC

## 2024-05-27 NOTE — Progress Notes (Unsigned)
 "  Established Patient Office Visit  Subjective:  Patient ID: Onie Hayashi, male    DOB: [birth year]  Age: 69 y.o. MRN: [id number]  No chief complaint on file.   HPI  Patient Active Problem List   Diagnosis Date Noted   Bite, fire ant 12/02/2023   Chronic rhinitis 10/02/2022   Primary osteoarthritis of left hip 06/09/2021   Anticoagulated on Coumadin  05/16/2021   Left hip pain 02/17/2021   Primary osteoarthritis of left knee 09/06/2020   H/O hernia repair 06/20/2020   COVID-19 lung 01/20/2020   Thrombocytopenia 08/12/2018   Liver lesion 08/12/2018   Lung nodule 08/12/2018   Presence of IVC filter 08/06/2018   DVT (deep venous thrombosis) (HCC) 07/29/2018   Renal insufficiency 06/27/2018   Insomnia 04/05/2017   OSA (obstructive sleep apnea) 03/21/2017   Primary osteoarthritis of both shoulders 11/28/2016   Controlled type 2 diabetes mellitus without complication, without long-term current use of insulin (HCC) 06/08/2016   Morbid obesity (HCC) 06/08/2016   Annual physical exam 01/25/2016   Paroxysmal atrial fibrillation (HCC) 06/15/2015   Polycythemia, secondary 08/26/2014   Hemochromatosis 08/26/2014   Multiple pulmonary nodules 08/16/2014   Testicular cancer (HCC) 08/12/2014   Hyperlipidemia 08/10/2014   Hypogonadism in male 08/10/2014   Essential hypertension 08/10/2014   Spinal stenosis of lumbar region 08/10/2014   History of pulmonary embolus (PE) 08/10/2014   CAD (coronary artery disease), native coronary artery 08/10/2014   Past Medical History:  Diagnosis Date   -+    Stent placed; [place]    Arthritis    Atrial fibrillation (HCC)    Cancer (HCC)    testicular cancer   Cataract, left eye    Congestive heart failure (CHF) (HCC)    DVT (deep venous thrombosis) (HCC)    High cholesterol    Hypertension    Obesity    Pneumonia    Psychosis (HCC)    Pulmonary embolus (HCC)    Past Surgical History:  Procedure Laterality Date    COLONOSCOPY     EYE SURGERY     HERNIA REPAIR     IVC  04/23/2012   JOINT REPLACEMENT     lamenectomy     left orchiectomy     SPINE SURGERY     TOTAL HIP ARTHROPLASTY Left 06/09/2021   Procedure: TOTAL HIP ARTHROPLASTY ANTERIOR APPROACH;  Surgeon: Yvone Rush, MD;  Location: [facility];  Service: Orthopedics;  Laterality: Left;   Social History[1]    ROS: as noted in HPI  Objective:     BP 132/76   Pulse 74   Ht 6' 2 (1.88 m)   Wt (!) 301 lb (136.5 kg)   SpO2 97%   BMI 38.65 kg/m  BP Readings from Last 3 Encounters:  05/27/24 132/76  04/02/24 124/72  03/11/24 (!) 147/83   Wt Readings from Last 3 Encounters:  05/27/24 (!) 301 lb (136.5 kg)  04/02/24 300 lb (136.1 kg)  03/11/24 300 lb (136.1 kg)      Physical Exam   No results found for any visits on 05/27/24.  Last CBC Lab Results  Component Value Date   WBC 7.6 03/11/2024   HGB 16.0 03/11/2024   HCT 49.5 03/11/2024   MCV 93 03/11/2024   MCH 30.1 03/11/2024   RDW 13.3 03/11/2024   PLT 155 03/11/2024   Last metabolic panel Lab Results  Component Value Date   GLUCOSE 182 (H) 03/11/2024   NA 138 03/11/2024   K 4.6 03/11/2024  CL 104 03/11/2024   CO2 19 (L) 03/11/2024   BUN 22 03/11/2024   CREATININE 1.34 (H) 03/11/2024   EGFR 58 (L) 03/11/2024   CALCIUM  10.2 03/11/2024   PROT 6.8 03/11/2024   ALBUMIN 4.5 03/11/2024   LABGLOB 2.3 03/11/2024   BILITOT 0.3 03/11/2024   ALKPHOS 82 03/11/2024   AST 37 03/11/2024   ALT 33 03/11/2024   ANIONGAP 8 05/29/2021   Last lipids Lab Results  Component Value Date   CHOL 170 11/19/2023   HDL 47 11/19/2023   LDLCALC 76 11/19/2023   TRIG 294 (H) 11/19/2023   CHOLHDL 3.6 11/19/2023   Last hemoglobin A1c Lab Results  Component Value Date   HGBA1C 7.1 (H) 03/11/2024   Last thyroid  functions Lab Results  Component Value Date   TSH 3.240 11/19/2023   Last vitamin D  Lab Results  Component Value Date   VD25OH 50 06/09/2014   Last vitamin B12 and  Folate Lab Results  Component Value Date   VITAMINB12 795 09/27/2020      03/11/2024   10:07 AM 11/19/2023    9:35 AM 04/25/2022    9:51 AM 03/21/2020   11:51 AM 10/04/2017   10:31 AM  Depression screen PHQ 2/9  Decreased Interest 1 0 0 0 0  Down, Depressed, Hopeless 1 0 0 0 0  PHQ - 2 Score 2 0 0 0 0  Altered sleeping 3    1  Tired, decreased energy 2    1  Change in appetite 2    1  Feeling bad or failure about yourself  0    0  Trouble concentrating 3    0  Moving slowly or fidgety/restless 0    0  Suicidal thoughts 0    0  PHQ-9 Score 12    3   Difficult doing work/chores Not difficult at all    Somewhat difficult     Data saved with a previous flowsheet row definition      The 10-year ASCVD risk score (Arnett DK, et al., 2019) is: 27.9%  Assessment & Plan:  Essential hypertension  Paroxysmal atrial fibrillation (HCC)  OSA (obstructive sleep apnea)  Controlled type 2 diabetes mellitus without complication, without long-term current use of insulin (HCC)  Renal insufficiency     No follow-ups on file.   Benton LITTIE Gave, PA    [1]  Social History Tobacco Use   Smoking status: Former    Current packs/day: 0.00    Types: Cigarettes    Quit date: 08/10/1999    Years since quitting: 24.8   Smokeless tobacco: Never  Vaping Use   Vaping status: Never Used  Substance Use Topics   Alcohol use: Not Currently   Drug use: No   "

## 2024-05-27 NOTE — Patient Instructions (Addendum)
 Return after 06/11/24 for A1C test only - this can be a nurse visit.  Follow up with me in 4 months.

## 2024-05-28 ENCOUNTER — Other Ambulatory Visit (HOSPITAL_COMMUNITY): Payer: Self-pay

## 2024-05-28 ENCOUNTER — Encounter: Payer: Self-pay | Admitting: Urgent Care

## 2024-05-28 ENCOUNTER — Ambulatory Visit: Payer: Self-pay | Admitting: Urgent Care

## 2024-05-28 ENCOUNTER — Telehealth: Payer: Self-pay | Admitting: Pharmacy Technician

## 2024-05-28 NOTE — Assessment & Plan Note (Signed)
 S/p orchiectomy and radiation

## 2024-05-28 NOTE — Telephone Encounter (Signed)
 Pharmacy Patient Advocate Encounter   Received notification from CoverMyMeds that prior authorization for Lidocaine  5% patches is required/requested.   Insurance verification completed.   The patient is insured through [facility].   Per test claim: PA required; PA started via CoverMyMeds. KEY [id number] . Waiting for clinical questions to populate.

## 2024-05-28 NOTE — Telephone Encounter (Signed)
 Pharmacy Patient Advocate Encounter  Received notification from [facility] MEDICARE that Prior Authorization for Lidocaine  5% patches has been APPROVED from 05/28/24 to 04/22/25. Ran test claim, Copay is $62.81. This test claim was processed through [facility]- copay amounts may vary at other pharmacies due to pharmacy/plan contracts, or as the patient moves through the different stages of their insurance plan.   PA #/Case ID/Reference #: [id number]

## 2024-05-29 LAB — LIPID PANEL
Chol/HDL Ratio: 3.4 ratio (ref 0.0–5.0)
Cholesterol, Total: 151 mg/dL (ref 100–199)
HDL: 45 mg/dL
LDL Chol Calc (NIH): 73 mg/dL (ref 0–99)
Triglycerides: 197 mg/dL — ABNORMAL HIGH (ref 0–149)
VLDL Cholesterol Cal: 33 mg/dL (ref 5–40)

## 2024-05-29 LAB — COMPREHENSIVE METABOLIC PANEL WITH GFR
ALT: 26 [IU]/L (ref 0–44)
AST: 28 [IU]/L (ref 0–40)
Albumin: 4.3 g/dL (ref 3.9–4.9)
Alkaline Phosphatase: 66 [IU]/L (ref 47–123)
BUN/Creatinine Ratio: 20 (ref 10–24)
BUN: 29 mg/dL — ABNORMAL HIGH (ref 8–27)
Bilirubin Total: 0.6 mg/dL (ref 0.0–1.2)
CO2: 18 mmol/L — ABNORMAL LOW (ref 20–29)
Calcium: 10.1 mg/dL (ref 8.6–10.2)
Chloride: 102 mmol/L (ref 96–106)
Creatinine, Ser: 1.43 mg/dL — ABNORMAL HIGH (ref 0.76–1.27)
Globulin, Total: 2.5 g/dL (ref 1.5–4.5)
Glucose: 191 mg/dL — ABNORMAL HIGH (ref 70–99)
Potassium: 4.6 mmol/L (ref 3.5–5.2)
Sodium: 136 mmol/L (ref 134–144)
Total Protein: 6.8 g/dL (ref 6.0–8.5)
eGFR: 54 mL/min/{1.73_m2} — ABNORMAL LOW

## 2024-05-29 LAB — PROTIME-INR
INR: 1.6 — ABNORMAL HIGH (ref 0.9–1.2)
Prothrombin Time: 17 s — ABNORMAL HIGH (ref 9.1–12.0)

## 2024-05-29 LAB — CBC WITH DIFFERENTIAL/PLATELET
Basophils Absolute: 0 10*3/uL (ref 0.0–0.2)
Basos: 0 %
EOS (ABSOLUTE): 0.3 10*3/uL (ref 0.0–0.4)
Eos: 3 %
Hematocrit: 51.7 % — ABNORMAL HIGH (ref 37.5–51.0)
Hemoglobin: 16.5 g/dL (ref 13.0–17.7)
Immature Grans (Abs): 0 10*3/uL (ref 0.0–0.1)
Immature Granulocytes: 0 %
Lymphocytes Absolute: 1.7 10*3/uL (ref 0.7–3.1)
Lymphs: 21 %
MCH: 27 pg (ref 26.6–33.0)
MCHC: 31.9 g/dL (ref 31.5–35.7)
MCV: 85 fL (ref 79–97)
Monocytes Absolute: 0.6 10*3/uL (ref 0.1–0.9)
Monocytes: 8 %
Neutrophils Absolute: 5.3 10*3/uL (ref 1.4–7.0)
Neutrophils: 68 %
Platelets: 152 10*3/uL (ref 150–450)
RBC: 6.12 x10E6/uL — ABNORMAL HIGH (ref 4.14–5.80)
RDW: 16 % — ABNORMAL HIGH (ref 11.6–15.4)
WBC: 7.8 10*3/uL (ref 3.4–10.8)

## 2024-05-29 LAB — TESTOSTERONE,FREE AND TOTAL
Testosterone, Free: 1.1 pg/mL — AB (ref 6.6–18.1)
Testosterone: 93 ng/dL — ABNORMAL LOW (ref 264–916)

## 2024-05-29 LAB — TSH: TSH: 3.32 u[IU]/mL (ref 0.450–4.500)

## 2024-06-01 ENCOUNTER — Other Ambulatory Visit

## 2024-06-12 ENCOUNTER — Ambulatory Visit

## 2024-06-22 ENCOUNTER — Ambulatory Visit: Admitting: Cardiology

## 2024-06-25 ENCOUNTER — Ambulatory Visit: Admitting: Diagnostic Neuroimaging

## 2024-09-24 ENCOUNTER — Ambulatory Visit: Admitting: Urgent Care
